# Patient Record
Sex: Female | Born: 1965 | Race: White | Hispanic: No | Marital: Single | Smoking: Current some day smoker
Health system: Southern US, Community
[De-identification: ages and names within clinical notes are randomized; demographics above are authoritative.]

## PROBLEM LIST (undated history)

## (undated) DIAGNOSIS — G8929 Other chronic pain: Secondary | ICD-10-CM

## (undated) DIAGNOSIS — J45909 Unspecified asthma, uncomplicated: Secondary | ICD-10-CM

## (undated) DIAGNOSIS — F329 Major depressive disorder, single episode, unspecified: Secondary | ICD-10-CM

## (undated) DIAGNOSIS — R519 Headache, unspecified: Secondary | ICD-10-CM

## (undated) DIAGNOSIS — C3490 Malignant neoplasm of unspecified part of unspecified bronchus or lung: Secondary | ICD-10-CM

## (undated) DIAGNOSIS — R011 Cardiac murmur, unspecified: Secondary | ICD-10-CM

## (undated) DIAGNOSIS — J449 Chronic obstructive pulmonary disease, unspecified: Secondary | ICD-10-CM

## (undated) DIAGNOSIS — F32A Depression, unspecified: Secondary | ICD-10-CM

## (undated) DIAGNOSIS — R0600 Dyspnea, unspecified: Secondary | ICD-10-CM

## (undated) HISTORY — DX: Malignant neoplasm of unspecified part of unspecified bronchus or lung: C34.90

## (undated) HISTORY — PX: TUBAL LIGATION: SHX77

## (undated) HISTORY — DX: Other chronic pain: G89.29

## (undated) HISTORY — DX: Headache, unspecified: R51.9

## (undated) HISTORY — DX: Chronic obstructive pulmonary disease, unspecified: J44.9

## (undated) NOTE — *Deleted (*Deleted)
Has armband been applied?  {yes no:314532}  Does patient have an allergy to IV contrast dye?: No   Has patient ever received premedication for IV contrast dye?: n/a  Does patient take metformin?: No  If patient does take metformin when was the last dose: n/a  Date of lab work: 11/16/2019 BUN: 25 CR: 0.91  eGfr: >60  IV site: Chest port  Has IV site been added to flowsheet?

## (undated) NOTE — *Deleted (*Deleted)
[facility]   [phone] [phone]   Clinic Follow up Note   Patient Care Team: Malachy Mood, MD as PCP - General (Hematology)  Date of Service:  10/21/2019  CHIEF COMPLAINT: F/u of Small Cell Lung Cancer  SUMMARY OF ONCOLOGIC HISTORY: Oncology History Overview Note  Cancer Staging Small cell lung cancer, left upper lobe [facility]) Staging form: Lung, AJCC 8th Edition - Clinical stage from 08/24/2018: Stage IIIA (cT1a, cN2, cM0) - Signed by Malachy Mood, MD on 10/17/2018    Small cell lung cancer, left upper lobe (HCC)  07/29/2018 Imaging   CT Angio Chest 07/29/18  IMPRESSION: 1. Malignant appearing left mediastinal and hilar lymphadenopathy narrowing the airways at the left hilum. In the left lung only a subtle 9 mm spiculated nodule is identified in the lingula. Consider small cell carcinoma. Bronchoscopy or mediastinal node sampling might be most valuable for diagnosis. 2. Indeterminate although low-density (which typically which indicates a benign adenoma) left adrenal nodule. 3.  Negative for acute pulmonary embolus.   08/24/2018 Definitive Surgery   Bronchoscopy and EBUS biopsy on 08/24/18 by Dr. Everardo All   08/24/2018 Initial Biopsy   Diagnosis 08/24/18 FINE NEEDLE ASPIRATION, ENDOSCOPIC, (EBUS) STATION # 7 LYMPH NODE(SPECIMEN 1 OF 4 COLLECTED 08/24/18): NO MALIGNANT CELLS IDENTIFIED.  FINE NEEDLE ASPIRATION, ENDOSCOPIC, (EBUS) LEFT HILAR LYMPH NODE(SPECIMEN 2 OF 4 COLLECTED 08/24/18): NO MALIGNANT CELLS IDENTIFIED.  TRANSBRONCHIAL NEEDLE ASPIRATION, WANG, (SPECIMEN 3 OF 4 COLLECTED 08/24/18): NO MALIGNANT CELLS IDENTIFIED.  BRONCHIAL BRUSHING, LUL (SPECIMEN 4 OF 4 COLLECTED 08/24/18): MALIGNANT CELLS PRESENT, MOST CONSISTENT WITH SMALL CELL CARCINOMA. SEE COMMENT.   08/24/2018 Cancer Staging   Staging form: Lung, AJCC 8th Edition - Clinical stage from 08/24/2018: Stage IIIA (cT1a, cN2, cM0) - Signed by Malachy Mood, MD on 10/17/2018   08/26/2018  Initial Diagnosis   Small cell lung cancer, left (HCC)   08/26/2018 Imaging   CT AP W Contrast 08/26/18  IMPRESSION: 1. 2.9 cm left adrenal nodule cannot be definitively characterized on this study. Potentially a lipid poor adenoma, but metastatic disease not excluded. MRI of the abdomen with and without contrast may prove helpful to further evaluate. 2. No other findings to suggest metastatic disease in the abdomen/pelvis. 3. Left base atelectasis with small left pleural effusion.   08/27/2018 Imaging   MRI brain 08/27/18  IMPRESSION: 1. Motion degraded, incomplete examination. 2. Subcentimeter focus of diffusion signal abnormality in the left cerebellum. No enhancement is evident to strongly suggest a metastasis, and this may reflect an acute to subacute small vessel infarct. 3. No evidence of intracranial metastatic disease elsewhere. 4. Given motion artifact and incomplete postcontrast imaging, consider short-term follow-up MRI (possibly with sedation) to further evaluate the left cerebellar abnormality and provide a more thorough evaluation for metastatic disease.   09/02/2018 PET scan   IMPRESSION: 1. Hypermetabolic left hilar mass is identified encasing the left mainstem bronchus resulting in postobstructive atelectasis in the left upper lobe. 2. Hypermetabolic left mediastinal nodal metastasis. 3. Loculated left pleural effusion is identified with mild increased FDG uptake. Cannot rule out malignant pleural effusion. 4. No evidence for metastatic disease to the abdomen or pelvis or skeletal structures.     09/07/2018 - 11/10/2018 Chemotherapy   Concurrent chemoRT with IV cisplatin on day 1 and etopside day 1-3 for every 3 weeks starting 8/11. Will start radiation with Dr. Mitzi Hansen with cycle 2. She completed 4 cycles on 11/10/18   09/14/2018 Procedure   Thoracentesis yielding 750 mL   09/28/2018 -  12/09/2019 Radiation Therapy   Concurrent chemoRT with Dr. Lisbeth Renshaw. Start with  cycle 2 chemo on 09/28/18 and completed on 12/09/18.    02/10/2019 Imaging   CT CAP W Contrast  IMPRESSION: 1. Interval response to therapy. Significant interval reduction in tumor burden involving the left hemithorax. 2. No new or progressive disease identified within the chest, abdomen or pelvis. 3. Emphysema and aortic atherosclerosis. 4. Stable left adrenal nodule.   Aortic Atherosclerosis (ICD10-I70.0) and Emphysema (ICD10-J43.9).   02/11/2019 Imaging   MRI Brain  IMPRESSION: 6 x 10 mm solitary metastatic deposit right frontal lobe with minimal edema.   02/28/2019 - 03/14/2019 Radiation Therapy   Target Brain Radiation with Dr. Lisbeth Renshaw    05/19/2019 Imaging   CT CAP w contrast  IMPRESSION: 1. Trace residual soft tissue thickening at the left lung apex and along the AP window. Slight increase in size of a low left paratracheal lymph node. Continued attention on follow-up exams is warranted. No evidence of distant metastatic disease. 2. Subpleural density in the posterior left upper lobe is new and may be due to atelectasis. Recommend attention on follow-up. 3. Left adrenal adenoma. 4. Aortic atherosclerosis (ICD10-I70.0). Coronary artery calcification. 5.  Emphysema (ICD10-J43.9).   07/13/2019 Imaging   MRI brain  IMPRESSION: 1. Increased size of right frontal lobe metastasis with new moderate edema. 2. No evidence of new intracranial metastases.     08/10/2019 Imaging   CT CAP W Contrast  IMPRESSION: 1. New and progressive adenopathy within the mediastinum and low left neck, consistent with nodal metastasis. 2. No evidence of pulmonary metastasis. Similar areas of probable scarring and pleural thickening. 3. No subdiaphragmatic metastatic disease identified. 4. Left adrenal adenoma. 5. Age advanced aortic atherosclerosis (ICD10-I70.0) and emphysema (ICD10-J43.9).     08/19/2019 Imaging   Ct Angio Chest  IMPRESSION: 1. No evidence of pulmonary embolus. 2. Stable  nodal metastasis within the mediastinum and left internal jugular chain. 3. Interval development of mild interlobular septal thickening and ground-glass opacity, greatest in the right upper lobe and left lower lobe. Differential includes asymmetric edema versus atypical infection. 4. Aortic Atherosclerosis (ICD10-I70.0) and Emphysema (ICD10-J43.9).     08/25/2019 Imaging   MRI Brain  IMPRESSION: Increased size of centrally necrotic enhancing right frontal lesion measuring 4.0 cm, previously 2.6 cm.   White matter edema and dural thickening/enhancement involving the right frontal convexity is more conspicuous than prior exam.   No new enhancing lesion.   09/01/2019 Procedure   PAC placement    09/19/2019 Imaging   MRI brain  IMPRESSION: 1. Postoperative changes from interval right frontal craniotomy for resection of previously identified right frontal lobe mass. Somewhat ill-defined parenchymal enhancement about the resection cavity favored to reflect postoperative changes. No definite residual tumor seen on this immediate postoperative scan. 2. Small 1.2 cm infarct at the deep margin of the resection cavity. 3. Interval increase in T2/FLAIR signal intensity involving the adjacent right frontal region with increased regional mass effect and up to 6 mm of right-to-left shift.   09/19/2019 Surgery   Right CRANIOTOMY TUMOR EXCISION, FRONTAL and APPLICATION OF CRANIAL NAVIGATION by Dr Kathyrn Sheriff   09/19/2019 Pathology Results   FINAL MICROSCOPIC DIAGNOSIS:   A. BRAIN TUMOR, RIGHT FRONTAL, EXCISION:  - Small cell carcinoma.   B. BRAIN TUMOR, RIGHT FRONTAL DURA, EXCISION:  - Small cell carcinoma.    COMMENT:   CK8/18 (dot-like), Synaptophysin and Chromogranin are positive.  CKAE1AE3, CK7 and Chromogranin are noncontributory.  Ki-67 proliferation  index reaches 70-80%.  The morphology and immunophenotype are compatible  with the provided clinical history of small cell  carcinoma.  Dr. Charm Barges  reviewed.    10/24/2019 -  Chemotherapy   First-line chemo Carboplatin day 1, Etopside day 1-3 with immunotherapy Tecentriq day 1 q3weeks starting 10/24/19      CURRENT THERAPY:  First-line chemo Carboplatin, Etopside day 1-3 with immunotherapy Tecentriq q3weeks starting 10/24/19  INTERVAL HISTORY: *** Marissa [name] is here for a follow up and treatment. She presents to the clinic alone.    REVIEW OF SYSTEMS:  *** Constitutional: Denies fevers, chills or abnormal weight loss Eyes: Denies blurriness of vision Ears, nose, mouth, throat, and face: Denies mucositis or sore throat Respiratory: Denies cough, dyspnea or wheezes Cardiovascular: Denies palpitation, chest discomfort or lower extremity swelling Gastrointestinal:  Denies nausea, heartburn or change in bowel habits Skin: Denies abnormal skin rashes Lymphatics: Denies new lymphadenopathy or easy bruising Neurological:Denies numbness, tingling or new weaknesses Behavioral/Psych: Mood is stable, no new changes  All other systems were reviewed with the patient and are negative.  MEDICAL HISTORY:  Past Medical History:  Diagnosis Date  . Asthma   . Chronic headaches   . COPD (chronic obstructive pulmonary disease) (HCC)   . Depression   . Dyspnea    hospitalized July 2020  . Heart murmur   . Small cell lung cancer (HCC)     SURGICAL HISTORY: Past Surgical History:  Procedure Laterality Date  . APPLICATION OF CRANIAL NAVIGATION Right 09/19/2019   Procedure: APPLICATION OF CRANIAL NAVIGATION;  Surgeon: Lisbeth Renshaw, MD;  Location: [facility] OR;  Service: Neurosurgery;  Laterality: Right;  APPLICATION OF CRANIAL NAVIGATION  . BRONCHIAL BIOPSY  08/24/2018   Procedure: BRONCHIAL BIOPSIES;  Surgeon: Luciano Cutter, MD;  Location: [facility];  Service: Cardiopulmonary;;  . BRONCHIAL BRUSHINGS  08/24/2018   Procedure: BRONCHIAL BRUSHINGS;  Surgeon: Luciano Cutter, MD;  Location: [facility];  Service:  Cardiopulmonary;;  . BRONCHIAL NEEDLE ASPIRATION BIOPSY  08/24/2018   Procedure: BRONCHIAL NEEDLE ASPIRATION BIOPSIES;  Surgeon: Luciano Cutter, MD;  Location: [facility];  Service: Cardiopulmonary;;  . CRANIOTOMY Right 09/19/2019   Procedure: CRANIOTOMY TUMOR EXCISION, FRONTAL;  Surgeon: Lisbeth Renshaw, MD;  Location[facility] OR;  Service: Neurosurgery;  Laterality: Right;  CRANIOTOMY TUMOR EXCISION, FRONTAL  . ENDOBRONCHIAL ULTRASOUND N/A 08/24/2018   Procedure: ENDOBRONCHIAL ULTRASOUND;  Surgeon: Luciano Cutter, MD;  Location: [facility];  Service: Cardiopulmonary;  Laterality: N/A;  . IR IMAGING GUIDED PORT INSERTION  09/01/2019  . IR THORACENTESIS ASP PLEURAL SPACE W/IMG GUIDE  09/14/2018  . TUBAL LIGATION    . VIDEO BRONCHOSCOPY  08/24/2018   Procedure: VIDEO BRONCHOSCOPY;  Surgeon: Luciano Cutter, MD;  Location: [facility];  Service: Cardiopulmonary;;    I have reviewed the social history and family history with the patient and they are unchanged from previous note.  ALLERGIES:  is allergic to penicillins.  MEDICATIONS:  Current Outpatient Medications  Medication Sig Dispense Refill  . acetaminophen (TYLENOL) 325 MG tablet Take 1,300 mg by mouth every 6 (six) hours as needed for mild pain or moderate pain.    . baclofen (LIORESAL) 10 MG tablet Take 1 tablet (10 mg total) by mouth 3 (three) times daily as needed for muscle spasms. 30 each 0  . diazepam (VALIUM) 10 MG tablet Take 1 tablet po 30 minutes prior to MRI or radiation (Patient taking differently: Take 20 mg by mouth See admin instructions. Take 1-2 tablets by mouth 30 minutes prior to MRI  or radiation) 5 tablet 0  . dronabinol (MARINOL) 5 MG capsule Take 1 capsule (5 mg total) by mouth 2 (two) times daily before a meal. 60 capsule 0  . DULoxetine (CYMBALTA) 20 MG capsule Take 2 capsules (40 mg total) by mouth daily. Take 1 capsule by mouth daily for the first week, then increase to 2 caps daily (Patient taking  differently: Take 40 mg by mouth daily. ) 60 capsule 1  . gabapentin (NEURONTIN) 300 MG capsule Take 1 tab in morning and 2 tabs at bedtime 90 capsule 2  . lidocaine (LIDODERM) 5 % Apply one patch to the area behind the affected ear as needed for pain (Patient taking differently: Place 1 patch onto the skin daily. ) 30 patch 0  . LORazepam (ATIVAN) 1 MG tablet Take 1 tablet (1 mg total) by mouth every 12 (twelve) hours as needed for anxiety. 30 tablet 0  . Oxycodone HCl 20 MG TABS Take 1 tablet (20 mg total) by mouth 3 (three) times daily as needed. 90 tablet 0  . promethazine (PHENERGAN) 25 MG tablet Take 1 tablet (25 mg total) by mouth every 6 (six) hours as needed for nausea or vomiting. 30 tablet 1   No current facility-administered medications for this visit.    PHYSICAL EXAMINATION: ECOG PERFORMANCE STATUS: {CHL ONC ECOG PS:[id number]}  There were no vitals filed for this visit. There were no vitals filed for this visit. *** GENERAL:alert, no distress and comfortable SKIN: skin color, texture, turgor are normal, no rashes or significant lesions EYES: normal, Conjunctiva are pink and non-injected, sclera clear {OROPHARYNX:no exudate, no erythema and lips, buccal mucosa, and tongue normal}  NECK: supple, thyroid normal size, non-tender, without nodularity LYMPH:  no palpable lymphadenopathy in the cervical, axillary {or inguinal} LUNGS: clear to auscultation and percussion with normal breathing effort HEART: regular rate & rhythm and no murmurs and no lower extremity edema ABDOMEN:abdomen soft, non-tender and normal bowel sounds Musculoskeletal:no cyanosis of digits and no clubbing  NEURO: alert & oriented x 3 with fluent speech, no focal motor/sensory deficits  LABORATORY DATA:  I have reviewed the data as listed CBC Latest Ref Rng & Units 09/19/2019 09/01/2019 08/19/2019  WBC 4.0 - 10.5 K/uL 7.0 6.9 9.3  Hemoglobin 12.0 - 15.0 g/dL 16.1 09.6 04.5  Hematocrit 36 - 46 % 42.0 44.7  43.4  Platelets 150 - 400 K/uL 305 300 328     CMP Latest Ref Rng & Units 09/19/2019 08/19/2019 08/18/2019  Glucose 70 - 99 mg/dL 409(W) 119(J) 96  BUN 6 - 20 mg/dL 47(W) 29(F) 62(Z)  Creatinine 0.44 - 1.00 mg/dL 3.08 6.57 8.46(N)  Sodium 135 - 145 mmol/L 143 142 145  Potassium 3.5 - 5.1 mmol/L 3.4(L) 4.0 4.6  Chloride 98 - 111 mmol/L 108 109 106  CO2 22 - 32 mmol/L 24 22 28   Calcium 8.9 - 10.3 mg/dL 62.9 9.7 9.5  Total Protein 6.5 - 8.1 g/dL - - 7.4  Total Bilirubin 0.3 - 1.2 mg/dL - - <0.1(L)  Alkaline Phos 38 - 126 U/L - - 67  AST 15 - 41 U/L - - 19  ALT 0 - 44 U/L - - 28      RADIOGRAPHIC STUDIES: I have personally reviewed the radiological images as listed and agreed with the findings in the report. No results found.   ASSESSMENT & PLAN:  Marissa [name] is a 71 y.o. female with    1.Small cell lung cancer, limited stagein 07/2018,brain metastasis in 01/2019, mediastinal  node recurrence in 07/2019 -She was diagnosed in 07/2018. Given her locally advanced disease (stage III cancer), she underwent standard treatment with concurrent chemoRT with 4 cycles of cisplatin/etopside.  -Unfortunately her Brain MRI from 02/11/19 showed 6x10 mm solitary brain metastasis in right frontal lobe. She completed radiation to brain 02/28/19-03/14/19 with Dr. Mitzi Hansen. -Her cancer progressed on CT CAP from 08/10/19 which showed new and progressive adenopathy within the mediastinum and low left neck consistent with nodal metastasis. No evidence of pulmonary or other distant metastasis. -Given symptomatic brain metastasis progression. She underwent right craniotomy tumor resection on 09/19/19. She is recovering well  -I recommend CT chest to determine new baseline. Given recurrent disease, I discussed restarting chemo with IV Carboplatin on day 1, Etopside day 1-3 with immunotherapy Tecentriq on day 1 q3weeks to control her disease. She is interested, plan to start on 9/27.  -the goal of therapy is palliative,  for disease control and prolonging her life  -F/u with start of chemo.    2. Headaches, secondary to brain metastasis, S/p resection.  -She underwent right craniotomy tumor excision with Dr Conchita Paris on 09/19/19. She her only head pain is from this currently.  -She will continue to f/u with Dr. Barbaraann Cao.  3. Smoking Cessation, COPD,H/oacute left tension pneumothorax in 07/2018 after bronchoscopy and EBUS biopsy (resolved) -She has a h/o heavy smoking. She has COPD. She is no longer on oxygen but has wheezing. Will continue inhaler and nebulizer.  -She has reduced her smoking to 10-11 cigarettes a day. I highly advised her to quit completely. She is still trying. -She has been using her supplemental oxygen and breathing treatment more often lately due to SOB. Will obtain CT chest next week for new baseline.   4. Severe LUQ and left flank pain, right knee pain  -She is now being seen by Dr Genia Hotter for pain management.She remains on Oxycodone 20mg  q6hours and Cymbalta.  -Her main pain is currently from her head at surgical site and her right knee.  -I refilled Gabapentin 300mg  today (10/14/19)  5. Iron deficient anemia, S/p 2 IV Iron doses in 07/2018.Currently resolved off chemo   6. Anxiety/Depression, irritability, Social Support  -On Ativan and Valium as needed.  -She notes she uses Marijuana to help her eat. She is onlow dose oxycodonenow. She denies any other use of recreational drugs.  -She will continue SW involvement. She has not seen MH provider yet.  -Currently on Medicaid, disability, food stamps. She gets $529 per month. Still does not have a car and gets transportation assistance from [facility].  -She plans to move to better housing (3 bedroom house) in 2 weeks -Mood seems more positive lately  7. Goals of care -She knows her new brain metastasis makes her cancer stage IV and carries a poor prognosis.  -goal of her treatment is palliative  -she is full code now     PLAN: -I refilled Gabapentin and Ativan today  -Lab, Flush, CT CAP w contrast next week for restaging.  -Lab, flush, F/u with me or NP Lacie and chemo Carboplatin, Etopside Tecentriq on 9/27 with Etopside on Day 1-3.      No problem-specific Assessment & Plan notes found for this encounter.   No orders of the defined types were placed in this encounter.  All questions were answered. The patient knows to call the clinic with any problems, questions or concerns. No barriers to learning was detected. The total time spent in the appointment was {CHL ONC TIME VISIT -  JYNWG:[id number]}.     Delphina Cahill 10/21/2019   Rogelia Rohrer, am acting as scribe for Malachy Mood, MD.   {Add scribe attestation statement}

## (undated) NOTE — *Deleted (*Deleted)
[facility]   [phone] [phone]   Clinic Follow up Note   Patient Care Team: Malachy Mood, MD as PCP - General (Hematology)  Date of Service:  11/25/2019  CHIEF COMPLAINT: F/u of Small Cell Lung Cancer  SUMMARY OF ONCOLOGIC HISTORY: Oncology History Overview Note  Cancer Staging Small cell lung cancer, left upper lobe (HCC) Staging form: Lung, AJCC 8th Edition - Clinical stage from 08/24/2018: Stage IIIA (cT1a, cN2, cM0) - Signed by Malachy Mood, MD on 10/17/2018    Small cell lung cancer, left upper lobe (HCC)  07/29/2018 Imaging   CT Angio Chest 07/29/18  IMPRESSION: 1. Malignant appearing left mediastinal and hilar lymphadenopathy narrowing the airways at the left hilum. In the left lung only a subtle 9 mm spiculated nodule is identified in the lingula. Consider small cell carcinoma. Bronchoscopy or mediastinal node sampling might be most valuable for diagnosis. 2. Indeterminate although low-density (which typically which indicates a benign adenoma) left adrenal nodule. 3.  Negative for acute pulmonary embolus.   08/24/2018 Definitive Surgery   Bronchoscopy and EBUS biopsy on 08/24/18 by Dr. Everardo All   08/24/2018 Initial Biopsy   Diagnosis 08/24/18 FINE NEEDLE ASPIRATION, ENDOSCOPIC, (EBUS) STATION # 7 LYMPH NODE(SPECIMEN 1 OF 4 COLLECTED 08/24/18): NO MALIGNANT CELLS IDENTIFIED.  FINE NEEDLE ASPIRATION, ENDOSCOPIC, (EBUS) LEFT HILAR LYMPH NODE(SPECIMEN 2 OF 4 COLLECTED 08/24/18): NO MALIGNANT CELLS IDENTIFIED.  TRANSBRONCHIAL NEEDLE ASPIRATION, WANG, (SPECIMEN 3 OF 4 COLLECTED 08/24/18): NO MALIGNANT CELLS IDENTIFIED.  BRONCHIAL BRUSHING, LUL (SPECIMEN 4 OF 4 COLLECTED 08/24/18): MALIGNANT CELLS PRESENT, MOST CONSISTENT WITH SMALL CELL CARCINOMA. SEE COMMENT.   08/24/2018 Cancer Staging   Staging form: Lung, AJCC 8th Edition - Clinical stage from 08/24/2018: Stage IIIA (cT1a, cN2, cM0) - Signed by Malachy Mood, MD on 10/17/2018   08/26/2018  Initial Diagnosis   Small cell lung cancer, left (HCC)   08/26/2018 Imaging   CT AP W Contrast 08/26/18  IMPRESSION: 1. 2.9 cm left adrenal nodule cannot be definitively characterized on this study. Potentially a lipid poor adenoma, but metastatic disease not excluded. MRI of the abdomen with and without contrast may prove helpful to further evaluate. 2. No other findings to suggest metastatic disease in the abdomen/pelvis. 3. Left base atelectasis with small left pleural effusion.   08/27/2018 Imaging   MRI brain 08/27/18  IMPRESSION: 1. Motion degraded, incomplete examination. 2. Subcentimeter focus of diffusion signal abnormality in the left cerebellum. No enhancement is evident to strongly suggest a metastasis, and this may reflect an acute to subacute small vessel infarct. 3. No evidence of intracranial metastatic disease elsewhere. 4. Given motion artifact and incomplete postcontrast imaging, consider short-term follow-up MRI (possibly with sedation) to further evaluate the left cerebellar abnormality and provide a more thorough evaluation for metastatic disease.   09/02/2018 PET scan   IMPRESSION: 1. Hypermetabolic left hilar mass is identified encasing the left mainstem bronchus resulting in postobstructive atelectasis in the left upper lobe. 2. Hypermetabolic left mediastinal nodal metastasis. 3. Loculated left pleural effusion is identified with mild increased FDG uptake. Cannot rule out malignant pleural effusion. 4. No evidence for metastatic disease to the abdomen or pelvis or skeletal structures.     09/07/2018 - 11/10/2018 Chemotherapy   Concurrent chemoRT with IV cisplatin on day 1 and etopside day 1-3 for every 3 weeks starting 8/11. Will start radiation with Dr. Mitzi Hansen with cycle 2. She completed 4 cycles on 11/10/18   09/14/2018 Procedure   Thoracentesis yielding 750 mL   09/28/2018 -  12/09/2019 Radiation Therapy   Concurrent chemoRT with Dr. Lisbeth Renshaw. Start with  cycle 2 chemo on 09/28/18 and completed on 12/09/18.    02/10/2019 Imaging   CT CAP W Contrast  IMPRESSION: 1. Interval response to therapy. Significant interval reduction in tumor burden involving the left hemithorax. 2. No new or progressive disease identified within the chest, abdomen or pelvis. 3. Emphysema and aortic atherosclerosis. 4. Stable left adrenal nodule.   Aortic Atherosclerosis (ICD10-I70.0) and Emphysema (ICD10-J43.9).   02/11/2019 Imaging   MRI Brain  IMPRESSION: 6 x 10 mm solitary metastatic deposit right frontal lobe with minimal edema.   02/28/2019 - 03/14/2019 Radiation Therapy   Target Brain Radiation with Dr. Lisbeth Renshaw    05/19/2019 Imaging   CT CAP w contrast  IMPRESSION: 1. Trace residual soft tissue thickening at the left lung apex and along the AP window. Slight increase in size of a low left paratracheal lymph node. Continued attention on follow-up exams is warranted. No evidence of distant metastatic disease. 2. Subpleural density in the posterior left upper lobe is new and may be due to atelectasis. Recommend attention on follow-up. 3. Left adrenal adenoma. 4. Aortic atherosclerosis (ICD10-I70.0). Coronary artery calcification. 5.  Emphysema (ICD10-J43.9).   07/13/2019 Imaging   MRI brain  IMPRESSION: 1. Increased size of right frontal lobe metastasis with new moderate edema. 2. No evidence of new intracranial metastases.     08/10/2019 Imaging   CT CAP W Contrast  IMPRESSION: 1. New and progressive adenopathy within the mediastinum and low left neck, consistent with nodal metastasis. 2. No evidence of pulmonary metastasis. Similar areas of probable scarring and pleural thickening. 3. No subdiaphragmatic metastatic disease identified. 4. Left adrenal adenoma. 5. Age advanced aortic atherosclerosis (ICD10-I70.0) and emphysema (ICD10-J43.9).     08/19/2019 Imaging   Ct Angio Chest  IMPRESSION: 1. No evidence of pulmonary embolus. 2. Stable  nodal metastasis within the mediastinum and left internal jugular chain. 3. Interval development of mild interlobular septal thickening and ground-glass opacity, greatest in the right upper lobe and left lower lobe. Differential includes asymmetric edema versus atypical infection. 4. Aortic Atherosclerosis (ICD10-I70.0) and Emphysema (ICD10-J43.9).     08/25/2019 Imaging   MRI Brain  IMPRESSION: Increased size of centrally necrotic enhancing right frontal lesion measuring 4.0 cm, previously 2.6 cm.   White matter edema and dural thickening/enhancement involving the right frontal convexity is more conspicuous than prior exam.   No new enhancing lesion.   09/01/2019 Procedure   PAC placement    09/19/2019 Imaging   MRI brain  IMPRESSION: 1. Postoperative changes from interval right frontal craniotomy for resection of previously identified right frontal lobe mass. Somewhat ill-defined parenchymal enhancement about the resection cavity favored to reflect postoperative changes. No definite residual tumor seen on this immediate postoperative scan. 2. Small 1.2 cm infarct at the deep margin of the resection cavity. 3. Interval increase in T2/FLAIR signal intensity involving the adjacent right frontal region with increased regional mass effect and up to 6 mm of right-to-left shift.   09/19/2019 Surgery   Right CRANIOTOMY TUMOR EXCISION, FRONTAL and APPLICATION OF CRANIAL NAVIGATION by Dr Kathyrn Sheriff   09/19/2019 Pathology Results   FINAL MICROSCOPIC DIAGNOSIS:   A. BRAIN TUMOR, RIGHT FRONTAL, EXCISION:  - Small cell carcinoma.   B. BRAIN TUMOR, RIGHT FRONTAL DURA, EXCISION:  - Small cell carcinoma.    COMMENT:   CK8/18 (dot-like), Synaptophysin and Chromogranin are positive.  CKAE1AE3, CK7 and Chromogranin are noncontributory.  Ki-67 proliferation  index reaches 70-80%.  The morphology and immunophenotype are compatible  with the provided clinical history of small cell  carcinoma.  Dr. Charm Barges  reviewed.    10/27/2019 Imaging   IMPRESSION: 1. Significant interval enlargement of bulky mediastinal and left hilar lymphadenopathy. There are new pleural nodules or prevascular lymph nodes. 2. Significant interval increase in masslike consolidation about the left hilum. There is interlobular septal thickening and scattered ground-glass and heterogeneous airspace opacity throughout the left lung, predominantly involving the left upper lobe and concerning for lymphangitic spread and postobstructive airspace disease. 3. There is somewhat nodular pleural thickening about the medial left upper lobe and a new small left pleural effusion. 4. Findings are consistent with worsened lung malignancy with pleural and nodal metastatic disease. 5. No significant interval change in a left adrenal mass, likely an incidental adenoma. Attention on follow-up. 6. No definite evidence of distant metastatic disease within the abdomen or pelvis. 7. Emphysema (ICD10-J43.9). 8. Aortic Atherosclerosis (ICD10-I70.0)   11/07/2019 -  Chemotherapy   First-line chemo Carboplatin day 1, Etopside day 1-3 with immunotherapy Tecentriq day 1 q3weeks starting 11/07/19      CURRENT THERAPY:  First-line chemo Carboplatin, Etopside day 1-3 with immunotherapy Tecentriq q3weeks starting 11/07/19  INTERVAL HISTORY: *** Kamiah Fite is here for a follow up. She presents to the clinic alone.     REVIEW OF SYSTEMS:  *** Constitutional: Denies fevers, chills or abnormal weight loss Eyes: Denies blurriness of vision Ears, nose, mouth, throat, and face: Denies mucositis or sore throat Respiratory: Denies cough, dyspnea or wheezes Cardiovascular: Denies palpitation, chest discomfort or lower extremity swelling Gastrointestinal:  Denies nausea, heartburn or change in bowel habits Skin: Denies abnormal skin rashes Lymphatics: Denies new lymphadenopathy or easy bruising Neurological:Denies  numbness, tingling or new weaknesses Behavioral/Psych: Mood is stable, no new changes  All other systems were reviewed with the patient and are negative.  MEDICAL HISTORY:  Past Medical History:  Diagnosis Date  . Asthma   . Chronic headaches   . COPD (chronic obstructive pulmonary disease) (HCC)   . Depression   . Dyspnea    hospitalized July 2020  . Heart murmur   . Small cell lung cancer (HCC)     SURGICAL HISTORY: Past Surgical History:  Procedure Laterality Date  . APPLICATION OF CRANIAL NAVIGATION Right 09/19/2019   Procedure: APPLICATION OF CRANIAL NAVIGATION;  Surgeon: Lisbeth Renshaw, MD;  Location: [facility] OR;  Service: Neurosurgery;  Laterality: Right;  APPLICATION OF CRANIAL NAVIGATION  . BRONCHIAL BIOPSY  08/24/2018   Procedure: BRONCHIAL BIOPSIES;  Surgeon: Luciano Cutter, MD;  Location: [facility];  Service: Cardiopulmonary;;  . BRONCHIAL BRUSHINGS  08/24/2018   Procedure: BRONCHIAL BRUSHINGS;  Surgeon: Luciano Cutter, MD;  Location: [facility];  Service: Cardiopulmonary;;  . BRONCHIAL NEEDLE ASPIRATION BIOPSY  08/24/2018   Procedure: BRONCHIAL NEEDLE ASPIRATION BIOPSIES;  Surgeon: Luciano Cutter, MD;  Location: [facility];  Service: Cardiopulmonary;;  . CRANIOTOMY Right 09/19/2019   Procedure: CRANIOTOMY TUMOR EXCISION, FRONTAL;  Surgeon: Lisbeth Renshaw, MD;  Location: [facility] OR;  Service: Neurosurgery;  Laterality: Right;  CRANIOTOMY TUMOR EXCISION, FRONTAL  . ENDOBRONCHIAL ULTRASOUND N/A 08/24/2018   Procedure: ENDOBRONCHIAL ULTRASOUND;  Surgeon: Luciano Cutter, MD;  Location: [facility];  Service: Cardiopulmonary;  Laterality: N/A;  . IR IMAGING GUIDED PORT INSERTION  09/01/2019  . IR THORACENTESIS ASP PLEURAL SPACE W/IMG GUIDE  09/14/2018  . TUBAL LIGATION    . VIDEO BRONCHOSCOPY  08/24/2018   Procedure: VIDEO BRONCHOSCOPY;  Surgeon: Luciano Cutter, MD;  Location: [facility];  Service: Cardiopulmonary;;    I have reviewed the social history and  family history with the patient and they are unchanged from previous note.  ALLERGIES:  is allergic to penicillins.  MEDICATIONS:  Current Outpatient Medications  Medication Sig Dispense Refill  . acetaminophen (TYLENOL) 325 MG tablet Take 1,300 mg by mouth every 6 (six) hours as needed for mild pain or moderate pain.    . baclofen (LIORESAL) 10 MG tablet Take 1 tablet (10 mg total) by mouth 3 (three) times daily as needed for muscle spasms. 30 each 0  . dexamethasone (DECADRON) 4 MG tablet Take 2 tablets (8 mg total) by mouth 2 (two) times daily. 30 tablet 0  . diazepam (VALIUM) 10 MG tablet Take 2 tablets (20 mg total) by mouth See admin instructions. Take 1-2 tablets by mouth 30 minutes prior to MRI or radiation 3 tablet 0  . dronabinol (MARINOL) 5 MG capsule Take 1 capsule (5 mg total) by mouth 2 (two) times daily before a meal. 60 capsule 0  . DULoxetine (CYMBALTA) 20 MG capsule Take 2 capsules (40 mg total) by mouth daily. Take 1 capsule by mouth daily for the first week, then increase to 2 caps daily (Patient not taking: Reported on 11/23/2019) 60 capsule 1  . gabapentin (NEURONTIN) 300 MG capsule Take 1 tab in morning and 2 tabs at bedtime 90 capsule 2  . lidocaine (LIDODERM) 5 % Place 1 patch onto the skin daily. 20 patch 1  . lidocaine-prilocaine (EMLA) cream Apply to affected area once 30 g 3  . LORazepam (ATIVAN) 1 MG tablet Take 1 tablet (1 mg total) by mouth every 12 (twelve) hours as needed for anxiety. 30 tablet 0  . ondansetron (ZOFRAN) 8 MG tablet Take 1 tablet (8 mg total) by mouth 2 (two) times daily as needed for refractory nausea / vomiting. Start on day 3 after carboplatin chemo. 30 tablet 1  . Oxycodone HCl 20 MG TABS Take 1 tablet (20 mg total) by mouth 3 (three) times daily as needed. 90 tablet 0  . prochlorperazine (COMPAZINE) 10 MG tablet Take 1 tablet (10 mg total) by mouth every 6 (six) hours as needed (Nausea or vomiting). 30 tablet 1   No current  facility-administered medications for this visit.    PHYSICAL EXAMINATION: ECOG PERFORMANCE STATUS: {CHL ONC ECOG PS:[id number]}  There were no vitals filed for this visit. There were no vitals filed for this visit. *** GENERAL:alert, no distress and comfortable SKIN: skin color, texture, turgor are normal, no rashes or significant lesions EYES: normal, Conjunctiva are pink and non-injected, sclera clear {OROPHARYNX:no exudate, no erythema and lips, buccal mucosa, and tongue normal}  NECK: supple, thyroid normal size, non-tender, without nodularity LYMPH:  no palpable lymphadenopathy in the cervical, axillary {or inguinal} LUNGS: clear to auscultation and percussion with normal breathing effort HEART: regular rate & rhythm and no murmurs and no lower extremity edema ABDOMEN:abdomen soft, non-tender and normal bowel sounds Musculoskeletal:no cyanosis of digits and no clubbing  NEURO: alert & oriented x 3 with fluent speech, no focal motor/sensory deficits  LABORATORY DATA:  I have reviewed the data as listed CBC Latest Ref Rng & Units 11/16/2019 11/07/2019 09/19/2019  WBC 4.0 - 10.5 K/uL 4.0 9.2 7.0  Hemoglobin 12.0 - 15.0 g/dL 10.9(L) 11.7(L) 13.2  Hematocrit 36 - 46 % 34.9(L) 38.6 42.0  Platelets 150 - 400 K/uL 240 446(H) 305     CMP Latest Ref Rng & Units 11/16/2019 11/07/2019 09/19/2019  Glucose 70 - 99 mg/dL 130(Q) 91 657(Q)  BUN 6 - 20 mg/dL 46(N) 16 62(X)  Creatinine 0.44 - 1.00 mg/dL 5.28 4.13 2.44  Sodium 135 - 145 mmol/L 142 141 143  Potassium 3.5 - 5.1 mmol/L 3.5 4.0 3.4(L)  Chloride 98 - 111 mmol/L 106 108 108  CO2 22 - 32 mmol/L 27 28 24   Calcium 8.9 - 10.3 mg/dL 01.0 9.5 27.2  Total Protein 6.5 - 8.1 g/dL 7.4 7.2 -  Total Bilirubin 0.3 - 1.2 mg/dL 0.3 <5.3(G) -  Alkaline Phos 38 - 126 U/L 90 87 -  AST 15 - 41 U/L 23 16 -  ALT 0 - 44 U/L 40 13 -      RADIOGRAPHIC STUDIES: I have personally reviewed the radiological images as listed and agreed with the  findings in the report. No results found.   ASSESSMENT & PLAN:  Marissa [name] is a 52 y.o. female with   1.Small cell lung cancer, limited stagein 07/2018,brain metastasis in 01/2019, mediastinal node recurrence in 07/2019 -She was diagnosed in 07/2018. Given her locally advanced disease (stage III cancer), she underwent standard treatment with concurrent chemoRT with 4 cycles of cisplatin/etopside.  -Unfortunately her Brain MRI from 02/11/19 showed 6x10 mm solitary brain metastasis in right frontal lobe. She completed radiation to brain 02/28/19-03/14/19 with Dr. Mitzi Hansen. -Her cancerprogressed on CT CAP from 08/10/19 which showednew and progressive adenopathy within the mediastinum and low left neck consistent with nodal metastasis. No evidence of pulmonary or other distant metastasis. -Given symptomatic brain metastasis progression. She underwent right craniotomy tumor resection on 09/19/19. -Given recurrent disease, I started her on first-line IV Carboplatin on day 1, Etopside day 1-3 with immunotherapy Tecentriq on day 1 q3weeks beginning 11/07/19. Goal is to control her disease and prolong her life.  -She tolerated her first cycle chemo with nausea and fatigue. She was able to recover. Labs reviewed, CBC and CMP WNL except Hg 10.9. Will proceed with IV Fluids today.  -F/u with C2 on 11/1  2. Head pain, Decreased right eye vision, Decreased jaw ROM -She underwent right craniotomy tumor excision with Dr Conchita Paris on 09/19/19.She will continue to f/u with Dr. Barbaraann Cao. -Since her surgery she notes increasing head pain triggered by her right scalp incision. Her incision has healed very well. Her pain can get up to 15/10 which is worse than before her surgery.  -She notes right eye vision has decreased and takes time for her vision to focus. Also notes decreased right jaw ROM which effects her eating.  -She has not followed up with Dr Barbaraann Cao or her neurosurgeon recently. I recommend MRI brain, she is  agreeable.   3. Smoking Cessation, COPD,H/oacute left tension pneumothorax in 07/2018 after bronchoscopy and EBUS biopsy (resolved) -She has a h/o heavy smoking. She has COPD. Will continue inhaler and nebulizer. -She has reduced her smoking to10-11cigarettes a day. I highly advised her to quit completely. -She does have disease progression in lung on 10/27/19 CT scan. Will proceed with chemo to better control disease.   4. Severe LUQ and left flank pain, right knee pain -She is now being seen by Dr Genia Hotter for pain management.She remains on Oxycodone20mg q6hours and Cymbalta and Gabapentin.and Lidocaine patch for her right knee pain.  -Her overall pain is controlled except her recently worsened headaches.   5. Iron deficient anemia, S/p 2 IV Iron doses in 07/2018. -Currently mild on first-line chemo. Will monitor.   6. Anxiety/Depression, irritability, Social Support -On Ativan and Valium as  needed. -She notes she uses Marijuana to help her eat.She is onlow dose oxycodonenow. She denies any other use of recreational drugs.  -She will continue SW involvement. She has not seen MH provider yet.  -Currently on Medicaid, disability, food stamps. She gets $529 per month. Still does not have a carand gets transportation assistance from [facility].  -She plans to move to better housing (3 bedroom house) soon.  -Mood seems more stable on her medication.   7. Goals of care -She knows her new brain metastasis makes her cancer stage IV and carries a poor prognosis.  -goal of her treatment is palliative  -she is full code now    PLAN: -I refilled valium, Marinol, lidocaine and ativan today  -MRI brain w wo contrast ASAP, will call her after MRI  -IV Fluids today  -Lab, flush, F/u with me or NP Lacie and chemo Carboplatin, Etopside Tecentriq on 11/1 with Etopside on Day 1-3.   No problem-specific Assessment & Plan notes found for this encounter.   No orders of the defined  types were placed in this encounter.  All questions were answered. The patient knows to call the clinic with any problems, questions or concerns. No barriers to learning was detected. The total time spent in the appointment was {CHL ONC TIME VISIT - [id number]}.     Delphina Cahill 11/25/2019   Rogelia Rohrer, am acting as scribe for Malachy Mood, MD.   {Add scribe attestation statement}

---
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
## 1997-05-15 ENCOUNTER — Emergency Department (HOSPITAL_COMMUNITY): Admission: EM | Admit: 1997-05-15 | Discharge: 1997-05-15 | Payer: Self-pay | Admitting: Emergency Medicine

## 1997-06-24 ENCOUNTER — Emergency Department (HOSPITAL_COMMUNITY): Admission: EM | Admit: 1997-06-24 | Discharge: 1997-06-24 | Payer: Self-pay | Admitting: Emergency Medicine

## 1997-08-20 ENCOUNTER — Emergency Department (HOSPITAL_COMMUNITY): Admission: EM | Admit: 1997-08-20 | Discharge: 1997-08-20 | Payer: Self-pay | Admitting: Emergency Medicine

## 1997-11-11 ENCOUNTER — Emergency Department (HOSPITAL_COMMUNITY): Admission: EM | Admit: 1997-11-11 | Discharge: 1997-11-11 | Payer: Self-pay | Admitting: Emergency Medicine

## 1997-11-12 ENCOUNTER — Encounter: Payer: Self-pay | Admitting: Emergency Medicine

## 1999-05-08 ENCOUNTER — Emergency Department (HOSPITAL_COMMUNITY): Admission: EM | Admit: 1999-05-08 | Discharge: 1999-05-08 | Payer: Self-pay | Admitting: Emergency Medicine

## 1999-06-24 ENCOUNTER — Encounter: Payer: Self-pay | Admitting: Emergency Medicine

## 1999-06-24 ENCOUNTER — Emergency Department (HOSPITAL_COMMUNITY): Admission: EM | Admit: 1999-06-24 | Discharge: 1999-06-24 | Payer: Self-pay | Admitting: Emergency Medicine

## 1999-09-14 ENCOUNTER — Emergency Department (HOSPITAL_COMMUNITY): Admission: EM | Admit: 1999-09-14 | Discharge: 1999-09-14 | Payer: Self-pay | Admitting: Emergency Medicine

## 2000-01-10 ENCOUNTER — Encounter: Payer: Self-pay | Admitting: Emergency Medicine

## 2000-01-10 ENCOUNTER — Emergency Department (HOSPITAL_COMMUNITY): Admission: EM | Admit: 2000-01-10 | Discharge: 2000-01-10 | Payer: Self-pay | Admitting: Emergency Medicine

## 2000-04-20 ENCOUNTER — Encounter: Payer: Self-pay | Admitting: Emergency Medicine

## 2000-04-20 ENCOUNTER — Emergency Department (HOSPITAL_COMMUNITY): Admission: EM | Admit: 2000-04-20 | Discharge: 2000-04-20 | Payer: Self-pay | Admitting: Emergency Medicine

## 2000-07-14 ENCOUNTER — Emergency Department (HOSPITAL_COMMUNITY): Admission: EM | Admit: 2000-07-14 | Discharge: 2000-07-14 | Payer: Self-pay | Admitting: Emergency Medicine

## 2000-07-14 ENCOUNTER — Encounter: Payer: Self-pay | Admitting: Emergency Medicine

## 2001-11-22 ENCOUNTER — Emergency Department (HOSPITAL_COMMUNITY): Admission: EM | Admit: 2001-11-22 | Discharge: 2001-11-22 | Payer: Self-pay | Admitting: Emergency Medicine

## 2001-11-25 ENCOUNTER — Encounter: Admission: RE | Admit: 2001-11-25 | Discharge: 2001-11-25 | Payer: Self-pay | Admitting: Obstetrics and Gynecology

## 2001-11-28 ENCOUNTER — Emergency Department (HOSPITAL_COMMUNITY): Admission: EM | Admit: 2001-11-28 | Discharge: 2001-11-28 | Payer: Self-pay | Admitting: Emergency Medicine

## 2002-04-24 ENCOUNTER — Emergency Department (HOSPITAL_COMMUNITY): Admission: EM | Admit: 2002-04-24 | Discharge: 2002-04-24 | Payer: Self-pay

## 2002-04-24 ENCOUNTER — Encounter: Payer: Self-pay | Admitting: Emergency Medicine

## 2002-08-30 ENCOUNTER — Emergency Department (HOSPITAL_COMMUNITY): Admission: EM | Admit: 2002-08-30 | Discharge: 2002-08-30 | Payer: Self-pay | Admitting: Emergency Medicine

## 2002-10-09 ENCOUNTER — Emergency Department (HOSPITAL_COMMUNITY): Admission: EM | Admit: 2002-10-09 | Discharge: 2002-10-10 | Payer: Self-pay | Admitting: Emergency Medicine

## 2003-04-06 ENCOUNTER — Emergency Department (HOSPITAL_COMMUNITY): Admission: EM | Admit: 2003-04-06 | Discharge: 2003-04-06 | Payer: Self-pay | Admitting: Family Medicine

## 2006-09-29 ENCOUNTER — Emergency Department (HOSPITAL_COMMUNITY): Admission: EM | Admit: 2006-09-29 | Discharge: 2006-09-29 | Payer: Self-pay | Admitting: Emergency Medicine

## 2009-03-16 ENCOUNTER — Emergency Department (HOSPITAL_COMMUNITY): Admission: EM | Admit: 2009-03-16 | Discharge: 2009-03-16 | Payer: Self-pay | Admitting: Emergency Medicine

## 2010-04-17 LAB — BASIC METABOLIC PANEL
BUN: 10 mg/dL (ref 6–23)
CO2: 28 mEq/L (ref 19–32)
Calcium: 9.3 mg/dL (ref 8.4–10.5)
Chloride: 104 mEq/L (ref 96–112)
Creatinine, Ser: 0.58 mg/dL (ref 0.4–1.2)
GFR calc Af Amer: >60 mL/min (ref >60)
GFR calc non Af Amer: >60 mL/min (ref >60)
Glucose, Bld: 97 mg/dL (ref 70–99)
Potassium: 3.6 mEq/L (ref 3.5–5.1)
Sodium: 137 mEq/L (ref 135–145)

## 2010-04-17 LAB — RAPID URINE DRUG SCREEN, HOSP PERFORMED
Amphetamines: POSITIVE — AB
Barbiturates: NOT DETECTED
Benzodiazepines: NOT DETECTED
Cocaine: POSITIVE — AB
Opiates: NOT DETECTED
Tetrahydrocannabinol: POSITIVE — AB

## 2010-04-17 LAB — DIFFERENTIAL
Basophils Absolute: 0.1 10*3/uL (ref 0.0–0.1)
Basophils Relative: 1 % (ref 0–1)
Eosinophils Absolute: 0.1 10*3/uL (ref 0.0–0.7)
Eosinophils Relative: 1 % (ref 0–5)
Lymphocytes Relative: 23 % (ref 12–46)
Lymphs Abs: 2 10*3/uL (ref 0.7–4.0)
Monocytes Absolute: 0.8 10*3/uL (ref 0.1–1.0)
Monocytes Relative: 9 % (ref 3–12)
Neutro Abs: 5.7 10*3/uL (ref 1.7–7.7)
Neutrophils Relative %: 66 % (ref 43–77)

## 2010-04-17 LAB — URINALYSIS, ROUTINE W REFLEX MICROSCOPIC
Bilirubin Urine: NEGATIVE
Glucose, UA: NEGATIVE mg/dL
Hgb urine dipstick: NEGATIVE
Ketones, ur: NEGATIVE mg/dL
Leukocytes, UA: NEGATIVE
Nitrite: NEGATIVE
Protein, ur: 30 mg/dL — AB
Specific Gravity, Urine: 1.025 (ref 1.005–1.030)
Urobilinogen, UA: 1 mg/dL (ref 0.0–1.0)
pH: 8.5 — ABNORMAL HIGH (ref 5.0–8.0)

## 2010-04-17 LAB — URINE MICROSCOPIC-ADD ON

## 2010-04-17 LAB — CBC
HCT: 41.7 % (ref 36.0–46.0)
Hemoglobin: 14.2 g/dL (ref 12.0–15.0)
MCHC: 34 g/dL (ref 30.0–36.0)
MCV: 89.3 fL (ref 78.0–100.0)
Platelets: 323 10*3/uL (ref 150–400)
RBC: 4.67 MIL/uL (ref 3.87–5.11)
RDW: 13.6 % (ref 11.5–15.5)
WBC: 8.6 10*3/uL (ref 4.0–10.5)

## 2010-04-17 LAB — POCT PREGNANCY, URINE: Preg Test, Ur: NEGATIVE

## 2010-04-17 LAB — ETHANOL: Alcohol, Ethyl (B): <5 mg/dL (ref 0–10)

## 2010-08-07 ENCOUNTER — Inpatient Hospital Stay (INDEPENDENT_AMBULATORY_CARE_PROVIDER_SITE_OTHER)
Admission: RE | Admit: 2010-08-07 | Discharge: 2010-08-07 | Disposition: A | Discharge status: Home / Self Care | Payer: Self-pay | Source: Ambulatory Visit | Attending: Family Medicine | Admitting: Family Medicine

## 2010-08-07 DIAGNOSIS — S025XXA Fracture of tooth (traumatic), initial encounter for closed fracture: Secondary | ICD-10-CM

## 2010-08-07 DIAGNOSIS — K089 Disorder of teeth and supporting structures, unspecified: Secondary | ICD-10-CM

## 2010-10-20 ENCOUNTER — Emergency Department (HOSPITAL_COMMUNITY)
Admission: EM | Admit: 2010-10-20 | Discharge: 2010-10-20 | Disposition: A | Discharge status: Home / Self Care | Payer: Self-pay | Attending: Emergency Medicine | Admitting: Emergency Medicine

## 2010-10-20 DIAGNOSIS — K047 Periapical abscess without sinus: Secondary | ICD-10-CM | POA: Insufficient documentation

## 2010-10-20 DIAGNOSIS — K0401 Reversible pulpitis: Secondary | ICD-10-CM | POA: Insufficient documentation

## 2010-10-20 DIAGNOSIS — K089 Disorder of teeth and supporting structures, unspecified: Secondary | ICD-10-CM | POA: Insufficient documentation

## 2012-02-17 ENCOUNTER — Emergency Department (HOSPITAL_COMMUNITY)
Admission: EM | Admit: 2012-02-17 | Discharge: 2012-02-17 | Disposition: A | Discharge status: Home / Self Care | Payer: Self-pay | Attending: Emergency Medicine | Admitting: Emergency Medicine

## 2012-02-17 ENCOUNTER — Encounter (HOSPITAL_COMMUNITY): Payer: Self-pay | Admitting: Emergency Medicine

## 2012-02-17 DIAGNOSIS — Z79899 Other long term (current) drug therapy: Secondary | ICD-10-CM | POA: Insufficient documentation

## 2012-02-17 DIAGNOSIS — H9190 Unspecified hearing loss, unspecified ear: Secondary | ICD-10-CM | POA: Insufficient documentation

## 2012-02-17 DIAGNOSIS — F17200 Nicotine dependence, unspecified, uncomplicated: Secondary | ICD-10-CM | POA: Insufficient documentation

## 2012-02-17 DIAGNOSIS — H60399 Other infective otitis externa, unspecified ear: Secondary | ICD-10-CM | POA: Insufficient documentation

## 2012-02-17 DIAGNOSIS — H6090 Unspecified otitis externa, unspecified ear: Secondary | ICD-10-CM

## 2012-02-17 DIAGNOSIS — J45909 Unspecified asthma, uncomplicated: Secondary | ICD-10-CM | POA: Insufficient documentation

## 2012-02-17 HISTORY — DX: Unspecified asthma, uncomplicated: J45.909

## 2012-02-17 MED ORDER — OFLOXACIN 0.3 % OT SOLN
5.0000 [drp] | Freq: Every day | OTIC | Status: DC
  Filled 2012-02-17: qty 5

## 2012-02-17 MED ORDER — OXYCODONE-ACETAMINOPHEN 5-325 MG PO TABS
2.0000 | ORAL_TABLET | Freq: Once | ORAL | Status: AC
  Administered 2012-02-17: 2 via ORAL
  Filled 2012-02-17: qty 2

## 2012-02-17 MED ORDER — OFLOXACIN 0.3 % OP SOLN
5.0000 [drp] | Freq: Every day | OPHTHALMIC | Status: DC
  Administered 2012-02-17: 5 [drp] via OTIC
  Filled 2012-02-17: qty 5

## 2012-02-17 MED ORDER — PERCOCET 5-325 MG PO TABS: 1.0000 | ORAL_TABLET | Freq: Four times a day (QID) | ORAL | Status: DC | PRN

## 2012-02-17 NOTE — ED Provider Notes (Signed)
History     CSN: [id number]  Arrival date & time 02/17/12  1258   First MD Initiated Contact with Patient 02/17/12 1437      No chief complaint on file.   (Consider location/radiation/quality/duration/timing/severity/associated sxs/prior treatment) Patient is a 47 y.o. female presenting with ear pain. The history is provided by the patient.  Otalgia This is a new problem. Episode onset: 3 days. There is pain in the right ear. The problem occurs constantly. The problem has been gradually worsening. There has been no fever. The pain is at a severity of 10/10. The pain is severe. Associated symptoms include hearing loss. Pertinent negatives include no ear discharge, no headaches, no rhinorrhea, no sore throat, no abdominal pain, no diarrhea, no vomiting, no neck pain, no cough and no rash. Her past medical history does not include chronic ear infection, hearing loss or tympanostomy tube.    Past Medical History  Diagnosis Date  . Asthma     No past surgical history on file.  No family history on file.  History  Substance Use Topics  . Smoking status: Current Every Day Smoker  . Smokeless tobacco: Not on file  . Alcohol Use: Yes    OB History    Grav Para Term Preterm Abortions TAB SAB Ect Mult Living                  Review of Systems  HENT: Positive for hearing loss and ear pain. Negative for sore throat, rhinorrhea, neck pain and ear discharge.   Respiratory: Negative for cough.   Gastrointestinal: Negative for vomiting, abdominal pain and diarrhea.  Skin: Negative for rash.  Neurological: Negative for headaches.  All other systems reviewed and are negative.    Allergies  Penicillins  Home Medications   Current Outpatient Rx  Name  Route  Sig  Dispense  Refill  . BC HEADACHE POWDER PO   Oral   Take 1 packet by mouth every 4 (four) hours as needed. For pain         . IBUPROFEN 200 MG PO TABS   Oral   Take 800 mg by mouth every 6 (six) hours as needed.  For pain           BP 135/68  Pulse 95  Temp 98.3 F (36.8 C)  Resp 16  SpO2 99%  Physical Exam  Nursing note and vitals reviewed. Constitutional: She is oriented to person, place, and time. She appears well-developed and well-nourished. No distress.  HENT:  Head: Normocephalic and atraumatic.  Left Ear: External ear normal.       Normal Oropharynx with out exudates, MMM. Left Ear canal with significant swelling & erythema, unable to visualize TM. No mastoid ttp or erythema. + pinna and tragal tenderness. Nasal congestion without sinus tenderness. Hearing = bilaterally.  Eyes: Conjunctivae normal and EOM are normal. Pupils are equal, round, and reactive to light.  Neck: Neck supple.       No nuchal rigidity, full normal ROM, pt can breath in extension without difficulty   Cardiovascular: Regular rhythm.        Tachycardic, other heart sounds normal  Pulmonary/Chest: Effort normal.       LCAB  Abdominal: Soft. Bowel sounds are normal.  Musculoskeletal: She exhibits no edema.       Diffuse myalgias, no bony ttp or decreased ROM  Neurological: She is oriented to person, place, and time.  Skin:       No rash, skin warm &  non diaphoretic    ED Course  Procedures (including critical care time)  Labs Reviewed - No data to display No results found.   No diagnosis found.    MDM  Otitis externa  Pt presenting with otitis externa. No canal occlusion, Pt afebrile in NAD. Exam non concerning for mastoiditis, cellulitis or malignant OE. Ear wick placed in ED. Dc with ofloxacin script.  Advised follow up in 2-3 days if no improvement with treatment or no complete resolution by 7 days. Earlier f-u if develops rash , allergic reaction to medication, or loss of hearing.         Jaci Carrel, [place] 02/17/12 1534

## 2012-02-17 NOTE — ED Notes (Signed)
Pt discharged.GCS 15.Stiil in pain

## 2012-02-17 NOTE — ED Notes (Signed)
Rt ear pain  X 3 days hurts to swollow on that side no drainage

## 2012-02-18 NOTE — ED Provider Notes (Signed)
Medical screening examination/treatment/procedure(s) were performed by non-physician practitioner and as supervising physician I was immediately available for consultation/collaboration.   Joya Gaskins, MD 02/18/12 2040

## 2012-04-27 ENCOUNTER — Emergency Department (HOSPITAL_COMMUNITY)
Admission: EM | Admit: 2012-04-27 | Discharge: 2012-04-27 | Disposition: A | Discharge status: Home / Self Care | Payer: Self-pay | Attending: Emergency Medicine | Admitting: Emergency Medicine

## 2012-04-27 ENCOUNTER — Encounter (HOSPITAL_COMMUNITY): Payer: Self-pay | Admitting: *Deleted

## 2012-04-27 DIAGNOSIS — R220 Localized swelling, mass and lump, head: Secondary | ICD-10-CM | POA: Insufficient documentation

## 2012-04-27 DIAGNOSIS — Z87828 Personal history of other (healed) physical injury and trauma: Secondary | ICD-10-CM | POA: Insufficient documentation

## 2012-04-27 DIAGNOSIS — F17200 Nicotine dependence, unspecified, uncomplicated: Secondary | ICD-10-CM | POA: Insufficient documentation

## 2012-04-27 DIAGNOSIS — J45909 Unspecified asthma, uncomplicated: Secondary | ICD-10-CM | POA: Insufficient documentation

## 2012-04-27 DIAGNOSIS — K047 Periapical abscess without sinus: Secondary | ICD-10-CM | POA: Insufficient documentation

## 2012-04-27 MED ORDER — IBUPROFEN 800 MG PO TABS
800.0000 mg | ORAL_TABLET | Freq: Once | ORAL | Status: AC
  Administered 2012-04-27: 800 mg via ORAL
  Filled 2012-04-27: qty 1

## 2012-04-27 MED ORDER — AMOXICILLIN 500 MG PO CAPS: 500.0000 mg | ORAL_CAPSULE | Freq: Three times a day (TID) | ORAL | Status: AC

## 2012-04-27 MED ORDER — IBUPROFEN 800 MG PO TABS: 800.0000 mg | ORAL_TABLET | Freq: Three times a day (TID) | ORAL | Status: DC

## 2012-04-27 MED ORDER — PROMETHAZINE HCL 25 MG PO TABS: 25.0000 mg | ORAL_TABLET | Freq: Four times a day (QID) | ORAL | Status: DC | PRN

## 2012-04-27 MED ORDER — AMOXICILLIN 250 MG PO CAPS
500.0000 mg | ORAL_CAPSULE | Freq: Once | ORAL | Status: AC
  Administered 2012-04-27: 500 mg via ORAL
  Filled 2012-04-27: qty 2

## 2012-04-27 NOTE — ED Provider Notes (Signed)
History     CSN: [id number]  Arrival date & time 04/27/12  1149   First MD Initiated Contact with Patient 04/27/12 1223      Chief Complaint  Patient presents with  . Dental Pain    (Consider location/radiation/quality/duration/timing/severity/associated sxs/prior treatment) HPI Comments: Marissa [name] is a 47 y.o. Female with a 1 day history of dental pain and gingival swelling.   The patient has a history of injury to the tooth involved, reporting she fractured the tooth ears ago but has never caused pain or swelling until recently.  There has been no fevers,  Chills, nausea or vomiting, also no complaint of difficulty swallowing,  Although chewing makes pain worse.  The patient has tried aspirin and ibuprofen without relief of symptoms.         The history is provided by the patient.    Past Medical History  Diagnosis Date  . Asthma     History reviewed. No pertinent past surgical history.  No family history on file.  History  Substance Use Topics  . Smoking status: Current Every Day Smoker  . Smokeless tobacco: Not on file  . Alcohol Use: Yes    OB History   Grav Para Term Preterm Abortions TAB SAB Ect Mult Living                  Review of Systems  Constitutional: Negative for fever and chills.  HENT: Positive for facial swelling and dental problem. Negative for congestion, sore throat, neck pain and neck stiffness.   Eyes: Negative.   Respiratory: Negative for shortness of breath.   Cardiovascular: Negative for chest pain.  Gastrointestinal: Negative for nausea.  Musculoskeletal: Negative for arthralgias.  Skin: Negative for color change.  Neurological: Negative for weakness and headaches.  Psychiatric/Behavioral: Negative.     Allergies  Penicillins  Home Medications   Current Outpatient Rx  Name  Route  Sig  Dispense  Refill  . aspirin (BAYER ASPIRIN) 325 MG tablet   Oral   Take 325 mg by mouth every 4 (four) hours as needed for pain.           . Aspirin-Salicylamide-Caffeine (BC HEADACHE POWDER PO)   Oral   Take 1 packet by mouth every 4 (four) hours as needed. For pain         . ibuprofen (ADVIL,MOTRIN) 200 MG tablet   Oral   Take 800 mg by mouth every 6 (six) hours as needed. For pain         . amoxicillin (AMOXIL) 500 MG capsule   Oral   Take 1 capsule (500 mg total) by mouth 3 (three) times daily.   30 capsule   0   . ibuprofen (ADVIL,MOTRIN) 800 MG tablet   Oral   Take 1 tablet (800 mg total) by mouth 3 (three) times daily.   21 tablet   0     BP 129/66  Pulse 98  Temp(Src) 98.1 F (36.7 C)  Resp 18  Ht 5\' 2"  (1.575 m)  Wt 165 lb (74.844 kg)  BMI 30.17 kg/m2  SpO2 100%  LMP 04/24/2012  Physical Exam  Constitutional: She is oriented to person, place, and time. She appears well-developed and well-nourished. No distress.  HENT:  Head: Normocephalic and atraumatic. No trismus in the jaw.  Right Ear: Tympanic membrane and external ear normal.  Left Ear: Tympanic membrane and external ear normal.  Mouth/Throat: Oropharynx is clear and moist and mucous membranes are normal. No oral  lesions. Dental abscesses present.    Eyes: Conjunctivae are normal.  Neck: Normal range of motion. Neck supple.  Cardiovascular: Normal rate and normal heart sounds.   Pulmonary/Chest: Effort normal.  Abdominal: She exhibits no distension.  Musculoskeletal: Normal range of motion.  Lymphadenopathy:    She has no cervical adenopathy.  Neurological: She is alert and oriented to person, place, and time.  Skin: Skin is warm and dry. No erythema.  Psychiatric: She has a normal mood and affect.    ED Course  Procedures (including critical care time)  Labs Reviewed - No data to display No results found.   1. Dental abscess       MDM  Pt does not have pcn allergy.  She states abx often makes her nauseated.  Will prescribe phenergan prn in addition to amoxil, ibuprofen.  Dental referrals  given.        Burgess Amor, PA-C 04/28/12 [phone]

## 2012-04-27 NOTE — ED Notes (Signed)
Pt reports left lower dental pain. Noted left lower swelling to jaw, and dental caries. Pt reports filling fell out about 10 years ago and has never had it fixed. Denies having a dentist , nor does she have money for a prescription. Pt asked if she could have a shot of penicillin instead of RX. However pt reports penicillin makes her sick. NAD noted at this time.

## 2012-04-27 NOTE — ED Notes (Signed)
Pt c/o left lower dental pain that started yesterday,

## 2012-04-28 NOTE — ED Provider Notes (Signed)
Medical screening examination/treatment/procedure(s) were performed by non-physician practitioner and as supervising physician I was immediately available for consultation/collaboration. Devoria Albe, MD, Armando Gang   Ward Givens, MD 04/28/12 [phone]

## 2012-06-11 ENCOUNTER — Emergency Department (HOSPITAL_COMMUNITY)
Admission: EM | Admit: 2012-06-11 | Discharge: 2012-06-11 | Disposition: A | Discharge status: Home / Self Care | Payer: Self-pay | Attending: Emergency Medicine | Admitting: Emergency Medicine

## 2012-06-11 ENCOUNTER — Encounter (HOSPITAL_COMMUNITY): Payer: Self-pay | Admitting: Emergency Medicine

## 2012-06-11 ENCOUNTER — Emergency Department (HOSPITAL_COMMUNITY): Payer: Self-pay

## 2012-06-11 DIAGNOSIS — S5010XA Contusion of unspecified forearm, initial encounter: Secondary | ICD-10-CM | POA: Insufficient documentation

## 2012-06-11 DIAGNOSIS — T148XXA Other injury of unspecified body region, initial encounter: Secondary | ICD-10-CM

## 2012-06-11 DIAGNOSIS — W208XXA Other cause of strike by thrown, projected or falling object, initial encounter: Secondary | ICD-10-CM | POA: Insufficient documentation

## 2012-06-11 DIAGNOSIS — Y939 Activity, unspecified: Secondary | ICD-10-CM | POA: Insufficient documentation

## 2012-06-11 DIAGNOSIS — M79601 Pain in right arm: Secondary | ICD-10-CM

## 2012-06-11 DIAGNOSIS — Y929 Unspecified place or not applicable: Secondary | ICD-10-CM | POA: Insufficient documentation

## 2012-06-11 DIAGNOSIS — J45909 Unspecified asthma, uncomplicated: Secondary | ICD-10-CM | POA: Insufficient documentation

## 2012-06-11 DIAGNOSIS — F17200 Nicotine dependence, unspecified, uncomplicated: Secondary | ICD-10-CM | POA: Insufficient documentation

## 2012-06-11 DIAGNOSIS — Z880 Allergy status to penicillin: Secondary | ICD-10-CM | POA: Insufficient documentation

## 2012-06-11 NOTE — ED Provider Notes (Signed)
History    This chart was scribed for Marissa Silk, PA working with Geoffery Lyons, MD by ED Scribe, Burman Nieves. This patient was seen in room WTR9/WTR9 and the patient's care was started at 6:31 PM.   CSN: [id number]  Arrival date & time 06/11/12  1806   First MD Initiated Contact with Patient 06/11/12 1831      Chief Complaint  Patient presents with  . Arm Pain    (Consider location/radiation/quality/duration/timing/severity/associated sxs/prior treatment) Patient is a 47 y.o. female presenting with arm pain. The history is provided by the patient. No language interpreter was used.  Arm Pain This is a new problem. The current episode started 2 days ago. The problem occurs constantly. The problem has been gradually worsening. Exacerbated by: movement. Nothing relieves the symptoms.   HPI Comments: Marissa [name] is a 47 y.o. female who presents to the Emergency Department complaining of moderate constant right forearm pain due to a 150 lb tv falling on her right arm last Wednesday. The pain radiates up and down her right forearm. Movement seems to exacerbate the pain in her right forearm. She has been taking BC powder with no immediate relief. She states that since the incident the pain has only become more persistent. Pt denies fever, chills, cough, nausea, vomiting, diarrhea, SOB, weakness, and any other associated symptoms.    Past Medical History  Diagnosis Date  . Asthma     History reviewed. No pertinent past surgical history.  No family history on file.  History  Substance Use Topics  . Smoking status: Current Every Day Smoker  . Smokeless tobacco: Not on file  . Alcohol Use: Yes    OB History   Grav Para Term Preterm Abortions TAB SAB Ect Mult Living                  Review of Systems  Musculoskeletal: Positive for myalgias.  All other systems reviewed and are negative.    Allergies  Penicillins  Home Medications   Current Outpatient Rx  Name  Route  Sig   Dispense  Refill  . Aspirin-Salicylamide-Caffeine (BC HEADACHE POWDER PO)   Oral   Take 1 packet by mouth every 4 (four) hours as needed. For pain           BP 132/85  Pulse 96  Temp(Src) 98.1 F (36.7 C) (Oral)  Resp 18  Wt 154 lb 2 oz (69.911 kg)  BMI 28.18 kg/m2  SpO2 96%  LMP 04/11/2012  Physical Exam  Nursing note and vitals reviewed. Constitutional: She is oriented to person, place, and time. She appears well-developed and well-nourished. No distress.  HENT:  Head: Normocephalic and atraumatic.  Right Ear: External ear normal.  Left Ear: External ear normal.  Nose: Nose normal.  Mouth/Throat: Oropharynx is clear and moist.  Eyes: Conjunctivae are normal.  Neck: Normal range of motion.  Cardiovascular: Normal rate, regular rhythm and normal heart sounds.   Pulmonary/Chest: Effort normal and breath sounds normal. No stridor. No respiratory distress. She has no wheezes. She has no rales.  Abdominal: Soft. She exhibits no distension.  Musculoskeletal: She exhibits tenderness.  ROM to her right elbow is normal. ROM to her right wrist decreased to mild swelling and pain. Tender upon palpation to right forearm.  Neurological: She is alert and oriented to person, place, and time. She has normal strength.  Skin: Skin is warm and dry. She is not diaphoretic. No erythema.  Bruising to her right forearm.  Psychiatric:  She has a normal mood and affect. Her behavior is normal.    ED Course  Procedures (including critical care time) DIAGNOSTIC STUDIES: Oxygen Saturation is 96% on room air, adequate by my interpretation.    COORDINATION OF CARE: 6:45 PM Discussed ED treatment with pt and pt agrees.     Labs Reviewed - No data to display Dg Forearm Right  06/11/2012   *RADIOLOGY REPORT*  Clinical Data: TV fell on patient's arm 2 days ago.  Pain and bruising.  RIGHT FOREARM - 2 VIEW  Comparison: None.  Findings: Bony mineralization is normal.  No acute or healing fracture or  focal bony abnormality is identified.  IMPRESSION: No acute bony abnormality.   Original Report Authenticated By: Britta Mccreedy, M.D.     1. Contusion   2. Arm pain, right       MDM  Patient presents with right arm pain after a TV fell on her arm 2 days ago. She has had increased pain and bruising. X-ray negative for fracture. Discussed rest, ice, compression, elevation. Take Tylenol and Advil for your pain. Compartments soft, neurovascularly intact. Resource guide given so patient can establish care in the community. Return instructions discussed. Vital signs stable for discharge. Patient / Family / Caregiver informed of clinical course, understand medical decision-making process, and agree with plan.      I personally performed the services described in this documentation, which was scribed in my presence. The recorded information has been reviewed and is accurate.     Mora Bellman, PA-C 06/11/12 2304

## 2012-06-11 NOTE — ED Notes (Signed)
Per pt, states TV fell on right arm-c/o pain, bruising

## 2012-06-11 NOTE — ED Provider Notes (Signed)
Medical screening examination/treatment/procedure(s) were performed by non-physician practitioner and as supervising physician I was immediately available for consultation/collaboration.  Nijel Flink, MD 06/11/12 2323 

## 2012-09-08 ENCOUNTER — Encounter (HOSPITAL_COMMUNITY): Payer: Self-pay | Admitting: Emergency Medicine

## 2012-09-08 ENCOUNTER — Emergency Department (HOSPITAL_COMMUNITY)
Admission: EM | Admit: 2012-09-08 | Discharge: 2012-09-08 | Disposition: A | Discharge status: Home / Self Care | Payer: Self-pay | Attending: Emergency Medicine | Admitting: Emergency Medicine

## 2012-09-08 DIAGNOSIS — Z7982 Long term (current) use of aspirin: Secondary | ICD-10-CM | POA: Insufficient documentation

## 2012-09-08 DIAGNOSIS — K089 Disorder of teeth and supporting structures, unspecified: Secondary | ICD-10-CM | POA: Insufficient documentation

## 2012-09-08 DIAGNOSIS — K0889 Other specified disorders of teeth and supporting structures: Secondary | ICD-10-CM

## 2012-09-08 DIAGNOSIS — K029 Dental caries, unspecified: Secondary | ICD-10-CM | POA: Insufficient documentation

## 2012-09-08 DIAGNOSIS — J45909 Unspecified asthma, uncomplicated: Secondary | ICD-10-CM | POA: Insufficient documentation

## 2012-09-08 DIAGNOSIS — Z880 Allergy status to penicillin: Secondary | ICD-10-CM | POA: Insufficient documentation

## 2012-09-08 DIAGNOSIS — F17200 Nicotine dependence, unspecified, uncomplicated: Secondary | ICD-10-CM | POA: Insufficient documentation

## 2012-09-08 MED ORDER — OXYCODONE-ACETAMINOPHEN 5-325 MG PO TABS
2.0000 | ORAL_TABLET | Freq: Once | ORAL | Status: AC
  Administered 2012-09-08: 2 via ORAL
  Filled 2012-09-08: qty 2

## 2012-09-08 MED ORDER — OXYCODONE-ACETAMINOPHEN 5-325 MG PO TABS
1.0000 | ORAL_TABLET | Freq: Four times a day (QID) | ORAL | Status: DC | PRN
Start: 2012-09-08 — End: 2013-03-06

## 2012-09-08 MED ORDER — AMOXICILLIN 500 MG PO CAPS: 500.0000 mg | ORAL_CAPSULE | Freq: Three times a day (TID) | ORAL | Status: DC

## 2012-09-08 NOTE — Progress Notes (Signed)
[facility] provided patient with a list of primary care resources, dental resources, and a [facility] [employer] [facility] application.  °

## 2012-09-08 NOTE — ED Provider Notes (Signed)
CSN: [id number]     Arrival date & time 09/08/12  1256 History     First MD Initiated Contact with Patient 09/08/12 1344     Chief Complaint  Patient presents with  . Dental Pain   (Consider location/radiation/quality/duration/timing/severity/associated sxs/prior Treatment) HPI  Patient presents to the emergency department with a dental complaint. Symptoms began 2 days ago. The patient has tried to alleviate pain with BC powders.  Pain rated at a 10/10, characterized as throbbing in nature and located right lower molar. Patient denies fever, night sweats, chills, difficulty swallowing or opening mouth, SOB, nuchal rigidity or decreased ROM of neck.  Patient does not have a dentist and requests a resource guide at discharge.    Past Medical History  Diagnosis Date  . Asthma    History reviewed. No pertinent past surgical history. No family history on file. History  Substance Use Topics  . Smoking status: Current Every Day Smoker  . Smokeless tobacco: Not on file  . Alcohol Use: Yes   OB History   Grav Para Term Preterm Abortions TAB SAB Ect Mult Living                 Review of Systems ROS is negative unless otherwise stated in the HPI  Allergies  Penicillins  Home Medications   Current Outpatient Rx  Name  Route  Sig  Dispense  Refill  . aspirin 325 MG tablet   Oral   Take 325 mg by mouth daily.         . Aspirin-Salicylamide-Caffeine (BC HEADACHE POWDER PO)   Oral   Take 1 packet by mouth every 4 (four) hours as needed. For pain          BP 137/81  Pulse 86  Temp(Src) 98.5 F (36.9 C)  Resp 16  SpO2 92% Physical Exam  Constitutional: She appears well-developed and well-nourished. No distress.  HENT:  Head: Normocephalic and atraumatic.  Mouth/Throat: Uvula is midline, oropharynx is clear and moist and mucous membranes are normal. Normal dentition. Dental caries (Pts tooth shows no obvious abscess but moderate to severe tenderness to palpation of  marked tooth) present. No edematous.    Eyes: Pupils are equal, round, and reactive to light.  Neck: Trachea normal, normal range of motion and full passive range of motion without pain. Neck supple.  Cardiovascular: Normal rate, regular rhythm, normal heart sounds and normal pulses.   Pulmonary/Chest: Effort normal and breath sounds normal. No respiratory distress. Chest wall is not dull to percussion. She exhibits no tenderness, no crepitus, no edema, no deformity and no retraction.  Abdominal: Normal appearance.  Musculoskeletal: Normal range of motion.  Neurological: She is alert. She has normal strength.  Skin: Skin is warm, dry and intact. She is not diaphoretic.  Psychiatric: She has a normal mood and affect. Her speech is normal. Cognition and memory are normal.    ED Course   Procedures (including critical care time)  Labs Reviewed - No data to display No results found. 1. Toothache     MDM  Patient has dental pain. No emergent s/sx's present. Patent airway. No trismus.  Will be given pain medication and antibiotics. I discussed the need to call dentist within 24/48 hours for follow-up. Dental referral given. Return to ED precautions given.  Pt voiced understanding and has agreed to follow-up.  Pt has penicillin allergy but informs me she takes amoxicillin without any problems.  Dorthula Matas, PA-C 09/08/12 1456  Demarie Uhlig Irine Seal,  PA-C 09/08/12 1457

## 2012-09-08 NOTE — ED Notes (Signed)
Pt c/o l dental abscess x1 day.  Reports that pain is radiating to ear.

## 2012-09-10 NOTE — ED Provider Notes (Signed)
Medical screening examination/treatment/procedure(s) were performed by non-physician practitioner and as supervising physician I was immediately available for consultation/collaboration.  Anusha Claus, MD 09/10/12 1810 

## 2013-03-06 ENCOUNTER — Emergency Department (HOSPITAL_COMMUNITY)
Admission: EM | Admit: 2013-03-06 | Discharge: 2013-03-06 | Disposition: A | Discharge status: Home / Self Care | Payer: Self-pay | Attending: Emergency Medicine | Admitting: Emergency Medicine

## 2013-03-06 ENCOUNTER — Encounter (HOSPITAL_COMMUNITY): Payer: Self-pay | Admitting: Emergency Medicine

## 2013-03-06 ENCOUNTER — Emergency Department (HOSPITAL_COMMUNITY): Payer: Self-pay

## 2013-03-06 DIAGNOSIS — R079 Chest pain, unspecified: Secondary | ICD-10-CM | POA: Insufficient documentation

## 2013-03-06 DIAGNOSIS — R110 Nausea: Secondary | ICD-10-CM | POA: Insufficient documentation

## 2013-03-06 DIAGNOSIS — F17200 Nicotine dependence, unspecified, uncomplicated: Secondary | ICD-10-CM | POA: Insufficient documentation

## 2013-03-06 DIAGNOSIS — J45901 Unspecified asthma with (acute) exacerbation: Secondary | ICD-10-CM | POA: Insufficient documentation

## 2013-03-06 DIAGNOSIS — Z880 Allergy status to penicillin: Secondary | ICD-10-CM | POA: Insufficient documentation

## 2013-03-06 DIAGNOSIS — K0889 Other specified disorders of teeth and supporting structures: Secondary | ICD-10-CM

## 2013-03-06 DIAGNOSIS — K089 Disorder of teeth and supporting structures, unspecified: Secondary | ICD-10-CM | POA: Insufficient documentation

## 2013-03-06 LAB — BASIC METABOLIC PANEL
BUN: 7 mg/dL (ref 6–23)
CO2: 24 mEq/L (ref 19–32)
Calcium: 9.2 mg/dL (ref 8.4–10.5)
Chloride: 103 mEq/L (ref 96–112)
Creatinine, Ser: 0.52 mg/dL (ref 0.50–1.10)
GFR calc Af Amer: >90 mL/min (ref >90)
GFR calc non Af Amer: >90 mL/min (ref >90)
Glucose, Bld: 107 mg/dL — ABNORMAL HIGH (ref 70–99)
Potassium: 3.8 mEq/L (ref 3.7–5.3)
Sodium: 139 mEq/L (ref 137–147)

## 2013-03-06 LAB — POCT I-STAT TROPONIN I
Troponin i, poc: 0 ng/mL (ref 0.00–0.08)
Troponin i, poc: 0 ng/mL (ref 0.00–0.08)

## 2013-03-06 LAB — CBC WITH DIFFERENTIAL/PLATELET
Basophils Absolute: 0.1 10*3/uL (ref 0.0–0.1)
Basophils Relative: 1 % (ref 0–1)
Eosinophils Absolute: 0.2 10*3/uL (ref 0.0–0.7)
Eosinophils Relative: 3 % (ref 0–5)
HCT: 45.8 % (ref 36.0–46.0)
Hemoglobin: 15 g/dL (ref 12.0–15.0)
Lymphocytes Relative: 23 % (ref 12–46)
Lymphs Abs: 1.7 10*3/uL (ref 0.7–4.0)
MCH: 29.5 pg (ref 26.0–34.0)
MCHC: 32.8 g/dL (ref 30.0–36.0)
MCV: 90.2 fL (ref 78.0–100.0)
Monocytes Absolute: 0.7 10*3/uL (ref 0.1–1.0)
Monocytes Relative: 10 % (ref 3–12)
Neutro Abs: 4.6 10*3/uL (ref 1.7–7.7)
Neutrophils Relative %: 64 % (ref 43–77)
Platelets: 367 10*3/uL (ref 150–400)
RBC: 5.08 MIL/uL (ref 3.87–5.11)
RDW: 14.3 % (ref 11.5–15.5)
WBC: 7.3 10*3/uL (ref 4.0–10.5)

## 2013-03-06 MED ORDER — TRAMADOL HCL 50 MG PO TABS: 50.0000 mg | ORAL_TABLET | Freq: Four times a day (QID) | ORAL | Status: DC | PRN

## 2013-03-06 MED ORDER — PROMETHAZINE HCL 25 MG PO TABS: 25.0000 mg | ORAL_TABLET | Freq: Four times a day (QID) | ORAL | Status: DC | PRN

## 2013-03-06 MED ORDER — ASPIRIN 81 MG PO CHEW
324.0000 mg | CHEWABLE_TABLET | Freq: Once | ORAL | Status: AC
  Administered 2013-03-06: 324 mg via ORAL
  Filled 2013-03-06: qty 4

## 2013-03-06 MED ORDER — PENICILLIN V POTASSIUM 500 MG PO TABS: 500.0000 mg | ORAL_TABLET | Freq: Four times a day (QID) | ORAL | Status: DC

## 2013-03-06 NOTE — ED Notes (Signed)
Pt c/o intermittent central chest pain and nausea x 2 weeks.  Pain score 9/10.  Hx of smoking.

## 2013-03-06 NOTE — Discharge Instructions (Signed)
Take Veetid as directed until gone. Take Phenergan as needed for nausea. Take tramadol as needed for pain. Refer to attached documents for more information. Follow up with a doctor from the resource guide. Return to the ED with worsening or concerning symptoms.    Emergency Department Resource Guide 1) Find a Doctor and Pay Out of Pocket Although you won't have to find out who is covered by your insurance plan, it is a good idea to ask around and get recommendations. You will then need to call the office and see if the doctor you have chosen will accept you as a new patient and what types of options they offer for patients who are self-pay. Some doctors offer discounts or will set up payment plans for their patients who do not have insurance, but you will need to ask so you aren't surprised when you get to your appointment.  2) Contact Your Local Health Department Not all health departments have doctors that can see patients for sick visits, but many do, so it is worth a call to see if yours does. If you don't know where your local health department is, you can check in your phone book. The CDC also has a tool to help you locate your state's health department, and many state websites also have listings of all of their local health departments.  3) Find a [facility] If your illness is not likely to be very severe or complicated, you may want to try a walk in clinic. These are popping up all over the country in pharmacies, drugstores, and shopping centers. They're usually staffed by nurse practitioners or physician assistants that have been trained to treat common illnesses and complaints. They're usually fairly quick and inexpensive. However, if you have serious medical issues or chronic medical problems, these are probably not your best option.  No Primary Care Doctor: - Call Health Connect at  [phone] - they can help you locate a primary care doctor that  accepts your insurance, provides certain  services, etc. - Physician Referral Service- [phone]  Chronic Pain Problems: Organization         Address  Phone   Notes  [facility]  [phone] Patients need to be referred by their primary care doctor.   Medication Assistance: Organization         Address  Phone   Notes  [facility] Medication [facility] [place], [place] [address] [phone] --Must be a resident of [facility] -- Must have NO insurance coverage whatsoever (no Medicaid/ Medicare, etc.) -- The pt. MUST have a primary care doctor that directs their care regularly and follows them in the community   MedAssist  [phone]   [employer]  [phone]    Agencies that provide inexpensive medical care: Organization         Address  Phone   Notes  [place]  [phone]   [facility]    [phone]   [facility] [place], [place] [address] [phone]   [place] [address], [place] [phone]   Planned Parenthood    [phone]   [facility]    [phone]   [place] and [place] [address], [place] Phone:  [phone], Fax:  [phone] Hours of Operation:  9 am - 6 pm, M-F.  Also accepts Medicaid/Medicare and self-pay.  [facility] for [address], [address], [place] Phone: [phone], Fax: [phone]. Hours of Operation:  8:30 am - 5:30 pm, M-F.  Also accepts Medicaid and self-pay.  [facility] [address] [place] Phone: [phone]   [place], [place], [place] [phone], Ext. [phone] Mondays & Thursdays: 7-9 AM.  First 15 patients are seen on a first come, first serve basis.    [place] Providers:  Organization         Address  Phone   Notes  [facility] [address], [place] [phone] Also accepts self-pay patients.  [facility] [address], [place]  [phone]   [place], [address], [place] [phone]   [facility] [address], [place] [phone]   Lucianne Lei [address], [place]   [phone] Only accepts [place] Access [place] patients after they have their name applied to their card.   Self-Pay (no insurance) in [facility] Xxx:  Organization         Address  Phone   Notes  Sickle Cell Patients, [facility] [place] [phone]   [facility] [place] [phone]   [facility] [place]  [place], [place], [place] [phone]   [facility]/Dr. Osei-Bonsu  [address] [place] or [address], [place] [phone] Phone number for both [place] and [place] locations is the same.  Urgent Medical and [facility] [address], [place] [phone]   [facility] [address] [place] or [address] [phone]   [facility] [address] [place] [phone], phone; [phone], fax Sees patients 1st and 3rd Saturday of every month.  Must not qualify for public or private insurance (i.e. Medicaid, Medicare, [place] Health Choice, Veterans' Benefits)  Household income should be no more than 200% of the poverty level The clinic cannot treat you if you are pregnant or think you are pregnant  Sexually transmitted diseases are not treated at the clinic.    Dental Care: Organization         Address  Phone  Notes  [facility] Department of [facility] [place] [phone] Accepts  children up to age 57 who are enrolled in [place] or [place]; pregnant women with a Medicaid card; and children who have applied for Medicaid or [place] Health Choice, but were declined, whose parents can pay a reduced fee at time of service.  [facility] Department of [facility]  [address], [place] [phone] Accepts children up to age 80 who are enrolled in [place] or [place]; pregnant women with a Medicaid card; and children who have applied for Medicaid or [place] Health Choice, but were declined, whose parents can pay a reduced fee at time of service.  [facility] Access PROGRAM  [place] [phone] Patients are seen by appointment only. Walk-ins are not accepted. [place] will see patients 42 years of age and older. Monday - Tuesday (  8am-5pm) Most Wednesdays (8:30-5pm) $30 per visit, cash only  [facility] Adult Dental Access PROGRAM  [address], [facility] [phone] Patients are seen by appointment only. Walk-ins are not accepted. [place] will see patients 78 years of age and older. One Wednesday Evening (Monthly: Volunteer Based).  $30 per visit, cash only  [place]  [phone] for adults; Children under age 55, call Graduate Pediatric Dentistry at [phone]. Children aged 17-14, please call [phone] to request a pediatric application.  Dental services are provided in all areas of dental care including fillings, crowns and bridges, complete and partial dentures, implants, gum treatment, root canals, and extractions. Preventive care is also provided. Treatment is provided to both adults and children. Patients are selected via a lottery and there is often a waiting list.   [facility], [place] [address], [place]  [phone] [email or web address]   [facility] [address] [place], [place] [phone], Ext. [phone] Second and  Fourth Thursday of each month, opens at 6:30 AM; Clinic ends at 9 AM.  Patients are seen on a first-come first-served basis, and a limited number are seen during each clinic.   [facility]  [address] Hillard Danker [place], [place] [phone]   Eligibility Requirements You must have lived in [place], [place], or [place] counties for at least the last three months.   You cannot be eligible for state or federal sponsored [employer], including [employer], [place], or [employer].   You generally cannot be eligible for healthcare insurance through your employer.    How to apply: Eligibility screenings are held every Tuesday and Wednesday afternoon from 1:00 pm until 4:00 pm. You do not need an appointment for the interview!  [facility] [address] [place], [place]   Head of the Harbor  [place] Department  [place]  [phone]    Behavioral Health Resources in the Community: Intensive Outpatient Programs Organization         Address  Phone  Notes  [place]. [address] [place], [place] [phone]   [facility] [address], [place], [place]   ADS: Alcohol & Drug Svcs [address] [place], [place]   [place] [address],  [place], [place] or [phone]   Substance Abuse Resources Organization         Address  Phone  Notes  Alcohol and Drug Services  [phone]   [place]  [phone]   [facility] [place]   Chinita Pester  [phone]   Residential & Outpatient Substance Abuse Program  [phone]   Psychological Services Organization         Address  Phone  Notes  [facility] [place]  [place]  [phone]   [place]  [address], [place] or [phone]    Mobile Crisis Teams Organization         Address  Phone  Notes  [facility], Mobile Crisis Care Unit  [phone]   [facility]otherapeutic [facility]  [address] [place], [place]   [place] [address], [place] [phone]    Self-Help/Support Groups Organization         Address  Phone  Notes  Mental Health Assoc. of [place] - variety of support groups  [place] Call for more information  Narcotics Anonymous (NA), [facility] [address], [employer] [place]  2 meetings at this location   Special educational needs teacher         Address  Phone  Notes  [facility] Residential Treatment [place],    [place]  [phone]   [facility]  [address] [place] [phone], [place], [place]   [place], [place] [phone] Admissions: 8am-3pm M-F  Incentives Substance [place] [address]    [place], [place] [phone]   [facility] [address] [place], [place], [place]   [facility] [address],  [place], [place]   [facility] - Intensive Outpatient [address] Kristeen [address], [place], [place]   [facility] [place].,  [place], [place] [phone] or [phone]   [facility] (RTS) [address] [place], [place] Accepts Medicaid  [facility] [facility] [address],  [place] [phone] Substance Abuse/Addiction Treatment   [facility] Organization         Address  Phone  Notes  [facility]  [phone]   Domenic Schwab, PhD [address] Arlis Porta [place], [place]   [phone] or [phone]   [place], [place] [phone]   [facility] [address] [place], [place] [phone] Insurance/Medicaid/sponsorship through [facility] and Families [address], [address] [place]                                    [place], [place] [phone] [place] [address], [place] [phone]    Dr. Adele Schilder  [phone]   [facility] Dept. 1) [address] [place] 2) [place] 3)  [place] [address], [place] [phone]  [phone]   [place] [phone] or [phone] (After Hours)

## 2013-03-06 NOTE — ED Notes (Signed)
She tells me she has had intermittent, central chest area "squeezing" discomfort x ~3 weeks.  She states this discomfort awakened her at ~0100 today; and has been occurring more frequently than previously ever since.  She further tells me that her mother "passed away of heart trouble; so I'm really worried about this.  She expresses financial concerns about the need to possibly see a cardiologist, citing inabiltiy to pay and no job or insurance.  Currently she is awake, alert and in no distress and is breathing normally.  Monitor shows nsr without ectopy.

## 2013-03-06 NOTE — ED Provider Notes (Signed)
Medical screening examination/treatment/procedure(s) were performed by non-physician practitioner and as supervising physician I was immediately available for consultation/collaboration.  EKG Interpretation   None        Leota Jacobsen, MD 03/06/13 2329

## 2013-03-06 NOTE — ED Provider Notes (Signed)
CSN: [id number]     Arrival date & time 03/06/13  1424 History   First MD Initiated Contact with Patient 03/06/13 [phone]     Chief Complaint  Patient presents with  . Chest Pain   (Consider location/radiation/quality/duration/timing/severity/associated sxs/prior Treatment) HPI Comments: Patient is a 48 year old female with a past medical history of asthma who presents with chest pain for the past 2 weeks. The pain has been intermittent without known trigger. The pain starts suddenly and is located in her central chest without radiation. The pain is described as pressure and will last hours at a time and resolve spontaneously without intervention. The most recent episode started around 1am suddenly and has been gradually improving for the past 15 hours. Patient reports some associated nausea. Patient also reports SOB, however, this is not new due to smoking history and asthma history. No aggravating/alleviating factors. Patient is a smoker. No other associated symptoms.    Past Medical History  Diagnosis Date  . Asthma    Past Surgical History  Procedure Laterality Date  . Tubal ligation     History reviewed. No pertinent family history. History  Substance Use Topics  . Smoking status: Current Every Day Smoker -- 0.50 packs/day    Types: Cigarettes  . Smokeless tobacco: Never Used  . Alcohol Use: Yes   OB History   Grav Para Term Preterm Abortions TAB SAB Ect Mult Living                 Review of Systems  Constitutional: Negative for fever, chills and fatigue.  HENT: Negative for trouble swallowing.   Eyes: Negative for visual disturbance.  Respiratory: Negative for shortness of breath.   Cardiovascular: Positive for chest pain. Negative for palpitations.  Gastrointestinal: Positive for nausea. Negative for vomiting, abdominal pain and diarrhea.  Genitourinary: Negative for dysuria and difficulty urinating.  Musculoskeletal: Negative for arthralgias and neck pain.  Skin: Negative  for color change.  Neurological: Negative for dizziness and weakness.  Psychiatric/Behavioral: Negative for dysphoric mood.    Allergies  Penicillins  Home Medications   Current Outpatient Rx  Name  Route  Sig  Dispense  Refill  . acetaminophen (TYLENOL) 500 MG tablet   Oral   Take 1,000 mg by mouth every 6 (six) hours as needed for mild pain or headache.         Marland Kitchen aspirin 325 MG tablet   Oral   Take 325 mg by mouth 2 (two) times daily as needed.          . Aspirin-Salicylamide-Caffeine (BC HEADACHE POWDER PO)   Oral   Take 1 packet by mouth every 4 (four) hours as needed. For pain          BP 148/96  Pulse 99  Temp(Src) 98 F (36.7 C) (Oral)  Resp 18  SpO2 97% Physical Exam  Nursing note and vitals reviewed. Constitutional: She is oriented to person, place, and time. She appears well-developed and well-nourished. No distress.  HENT:  Head: Normocephalic and atraumatic.  Eyes: Conjunctivae and EOM are normal.  Neck: Normal range of motion.  Cardiovascular: Normal rate and regular rhythm.  Exam reveals no gallop and no friction rub.   No murmur heard. Pulmonary/Chest: Effort normal. She has wheezes. She has no rales. She exhibits no tenderness.  Bilateral upper lung field inspiratory and expiratory wheezes.   Abdominal: Soft. She exhibits no distension. There is no tenderness. There is no rebound.  Musculoskeletal: Normal range of motion.  Neurological: She is alert and oriented to person, place, and time. Coordination normal.  Speech is goal-oriented. Moves limbs without ataxia.   Skin: Skin is warm and dry.  Psychiatric: She has a normal mood and affect. Her behavior is normal.    ED Course  Procedures (including critical care time) Labs Review Labs Reviewed  CBC WITH DIFFERENTIAL  BASIC METABOLIC PANEL  POCT I-STAT TROPONIN I   Imaging Review Dg Chest 2 View  03/06/2013   CLINICAL DATA:  Cough and chest pain  EXAM: CHEST  2 VIEW  COMPARISON:  None.   FINDINGS: Lungs are clear. Heart size and pulmonary vascularity are normal. No adenopathy. No pneumothorax. There is a bony bridge connecting the right second and third ribs, an unusual anatomic variant.  IMPRESSION: No edema or consolidation.   Electronically Signed   By: Lowella Grip M.D.   On: 03/06/2013 15:35    EKG Interpretation   None       MDM   1. Chest pain   2. Pain, dental     4:05 PM Labs and chest xray unremarkable for acute changes. Vitals stable and patient afebrile. Patient not having chest pain at this time.   4:43 PM Patient will have delta troponin. Patient is PERC negative. If troponin is negative, patient can be discharged with recommendation to follow up with PCP on resource guide. Patient sounds non cardiac and may be related to her asthma.   6:38 PM Troponin negative. Patient is requesting antibiotics for her dental pain. Patient will have a resource guide with recommended follow up with a dentist and PCP. Patient will return with worsening or concerning symptoms.   Alvina Chou, [place] 03/06/13 1839

## 2013-08-19 ENCOUNTER — Encounter (HOSPITAL_COMMUNITY): Payer: Self-pay | Admitting: Emergency Medicine

## 2013-08-19 ENCOUNTER — Emergency Department (HOSPITAL_COMMUNITY)
Admission: EM | Admit: 2013-08-19 | Discharge: 2013-08-19 | Disposition: A | Discharge status: Home / Self Care | Payer: Self-pay | Attending: Emergency Medicine | Admitting: Emergency Medicine

## 2013-08-19 DIAGNOSIS — F17200 Nicotine dependence, unspecified, uncomplicated: Secondary | ICD-10-CM | POA: Insufficient documentation

## 2013-08-19 DIAGNOSIS — T7840XA Allergy, unspecified, initial encounter: Secondary | ICD-10-CM

## 2013-08-19 DIAGNOSIS — J45909 Unspecified asthma, uncomplicated: Secondary | ICD-10-CM | POA: Insufficient documentation

## 2013-08-19 DIAGNOSIS — T4995XA Adverse effect of unspecified topical agent, initial encounter: Secondary | ICD-10-CM | POA: Insufficient documentation

## 2013-08-19 DIAGNOSIS — Z880 Allergy status to penicillin: Secondary | ICD-10-CM | POA: Insufficient documentation

## 2013-08-19 DIAGNOSIS — L509 Urticaria, unspecified: Secondary | ICD-10-CM | POA: Insufficient documentation

## 2013-08-19 DIAGNOSIS — R21 Rash and other nonspecific skin eruption: Secondary | ICD-10-CM | POA: Insufficient documentation

## 2013-08-19 MED ORDER — DIPHENHYDRAMINE HCL 25 MG PO TABS: 25.0000 mg | ORAL_TABLET | Freq: Four times a day (QID) | ORAL | Status: DC | PRN

## 2013-08-19 MED ORDER — DIPHENHYDRAMINE HCL 25 MG PO CAPS
25.0000 mg | ORAL_CAPSULE | Freq: Once | ORAL | Status: AC
  Administered 2013-08-19: 25 mg via ORAL
  Filled 2013-08-19: qty 1

## 2013-08-19 MED ORDER — PREDNISONE 20 MG PO TABS: 40.0000 mg | ORAL_TABLET | Freq: Every day | ORAL | Status: DC

## 2013-08-19 MED ORDER — PREDNISONE 20 MG PO TABS
60.0000 mg | ORAL_TABLET | Freq: Once | ORAL | Status: AC
  Administered 2013-08-19: 60 mg via ORAL
  Filled 2013-08-19: qty 3

## 2013-08-19 NOTE — ED Provider Notes (Signed)
Medical screening examination/treatment/procedure(s) were performed by non-physician practitioner and as supervising physician I was immediately available for consultation/collaboration.   EKG Interpretation None        Neta Ehlers, MD 08/19/13 1921

## 2013-08-19 NOTE — Discharge Instructions (Signed)
Please follow up with your primary care physician in 1-2 days. If you do not have one please call the [place] number listed above. Please take medications as prescribed. Please read all discharge instructions and return precautions.    Allergies Allergies may happen from anything your body is sensitive to. This may be food, medicines, pollens, chemicals, and nearly anything around you in everyday life that produces allergens. An allergen is anything that causes an allergy producing substance. Heredity is often a factor in causing these problems. This means you may have some of the same allergies as your parents. Food allergies happen in all age groups. Food allergies are some of the most severe and life threatening. Some common food allergies are cow's milk, seafood, eggs, nuts, wheat, and soybeans. SYMPTOMS   Swelling around the mouth.  An itchy red rash or hives.  Vomiting or diarrhea.  Difficulty breathing. SEVERE ALLERGIC REACTIONS ARE LIFE-THREATENING. This reaction is called anaphylaxis. It can cause the mouth and throat to swell and cause difficulty with breathing and swallowing. In severe reactions only a trace amount of food (for example, peanut oil in a salad) may cause death within seconds. Seasonal allergies occur in all age groups. These are seasonal because they usually occur during the same season every year. They may be a reaction to molds, grass pollens, or tree pollens. Other causes of problems are house dust mite allergens, pet dander, and mold spores. The symptoms often consist of nasal congestion, a runny itchy nose associated with sneezing, and tearing itchy eyes. There is often an associated itching of the mouth and ears. The problems happen when you come in contact with pollens and other allergens. Allergens are the particles in the air that the body reacts to with an allergic reaction. This causes you to release allergic antibodies. Through a chain of  events, these eventually cause you to release histamine into the blood stream. Although it is meant to be protective to the body, it is this release that causes your discomfort. This is why you were given anti-histamines to feel better. If you are unable to pinpoint the offending allergen, it may be determined by skin or blood testing. Allergies cannot be cured but can be controlled with medicine. Hay fever is a collection of all or some of the seasonal allergy problems. It may often be treated with simple over-the-counter medicine such as diphenhydramine. Take medicine as directed. Do not drink alcohol or drive while taking this medicine. Check with your caregiver or package insert for child dosages. If these medicines are not effective, there are many new medicines your caregiver can prescribe. Stronger medicine such as nasal spray, eye drops, and corticosteroids may be used if the first things you try do not work well. Other treatments such as immunotherapy or desensitizing injections can be used if all else fails. Follow up with your caregiver if problems continue. These seasonal allergies are usually not life threatening. They are generally more of a nuisance that can often be handled using medicine. HOME CARE INSTRUCTIONS   If unsure what causes a reaction, keep a diary of foods eaten and symptoms that follow. Avoid foods that cause reactions.  If hives or rash are present:  Take medicine as directed.  You may use an over-the-counter antihistamine (diphenhydramine) for hives and itching as needed.  Apply cold compresses (cloths) to the skin or take baths in cool water. Avoid hot baths or showers. Heat will make a rash and itching worse.  If you are severely allergic: °¨ Following a treatment for a severe reaction, hospitalization is often required for closer follow-up. °¨ Wear a medic-alert bracelet or necklace stating the allergy. °¨ You and your family must learn how to give adrenaline or use  an anaphylaxis kit. °¨ If you have had a severe reaction, always carry your anaphylaxis kit or EpiPen® with you. Use this medicine as directed by your caregiver if a severe reaction is occurring. Failure to do so could have a fatal outcome. °SEEK MEDICAL CARE IF: °· You suspect a food allergy. Symptoms generally happen within 30 minutes of eating a food. °· Your symptoms have not gone away within 2 days or are getting worse. °· You develop new symptoms. °· You want to retest yourself or your child with a food or drink you think causes an allergic reaction. Never do this if an anaphylactic reaction to that food or drink has happened before. Only do this under the care of a caregiver. °SEEK IMMEDIATE MEDICAL CARE IF:  °· You have difficulty breathing, are wheezing, or have a tight feeling in your chest or throat. °· You have a swollen mouth, or you have hives, swelling, or itching all over your body. °· You have had a severe reaction that has responded to your anaphylaxis kit or an EpiPen®. These reactions may return when the medicine has worn off. These reactions should be considered life threatening. °MAKE SURE YOU:  °· Understand these instructions. °· Will watch your condition. °· Will get help right away if you are not doing well or get worse. °Document Released: 04/08/2002 Document Revised: 05/10/2012 Document Reviewed: 09/13/2007 °ExitCare® Patient Information ©2015 [employer]. This information is not intended to replace advice given to you by your health care provider. Make sure you discuss any questions you have with your health care provider. ° ° °

## 2013-08-19 NOTE — ED Provider Notes (Signed)
CSN: [id number]     Arrival date & time 08/19/13  5462 History   First MD Initiated Contact with Patient 08/19/13 0940     Chief Complaint  Patient presents with  . Rash     (Consider location/radiation/quality/duration/timing/severity/associated sxs/prior Treatment) Patient is a 48 y.o. female presenting with allergic reaction.  Allergic Reaction Presenting symptoms: itching and rash   Presenting symptoms: no difficulty breathing, no difficulty swallowing, no swelling and no wheezing   Itching:    Location:  Leg, arm and back   Severity:  Moderate   Onset quality:  Gradual   Duration:  2 days   Timing:  Constant   Progression:  Worsening Severity:  Moderate Prior allergic episodes:  No prior episodes Context: grass and insect bite/sting (possible)   Relieved by:  Nothing Worsened by:  Nothing tried Ineffective treatments:  OTC ointments   Past Medical History  Diagnosis Date  . Asthma    Past Surgical History  Procedure Laterality Date  . Tubal ligation     No family history on file. History  Substance Use Topics  . Smoking status: Current Every Day Smoker -- 0.50 packs/day    Types: Cigarettes  . Smokeless tobacco: Never Used  . Alcohol Use: Yes   OB History   Grav Para Term Preterm Abortions TAB SAB Ect Mult Living                 Review of Systems  HENT: Negative for trouble swallowing.   Respiratory: Negative for wheezing.   Skin: Positive for itching and rash.  All other systems reviewed and are negative.     Allergies  Penicillins  Home Medications   Prior to Admission medications   Medication Sig Start Date End Date Taking? Authorizing Provider  aspirin 325 MG tablet Take 975 mg by mouth 2 (two) times daily as needed for mild pain.    Yes Historical Provider, MD  diphenhydrAMINE (BENADRYL) 25 MG tablet Take 1 tablet (25 mg total) by mouth every 6 (six) hours as needed for itching (Rash). 08/19/13   Jo-Ann Johanning L Owin Vignola, PA-C  predniSONE  (DELTASONE) 20 MG tablet Take 2 tablets (40 mg total) by mouth daily. 08/19/13   Jaxtyn Linville L Linton Stolp, PA-C   BP 149/79  Pulse 89  Temp(Src) 97.9 F (36.6 C) (Oral)  Resp 18  SpO2 96% Physical Exam  Nursing note and vitals reviewed. Constitutional: She is oriented to person, place, and time. She appears well-developed and well-nourished. No distress.  HENT:  Head: Normocephalic and atraumatic.  Right Ear: External ear normal.  Left Ear: External ear normal.  Nose: Nose normal.  Mouth/Throat: Uvula is midline, oropharynx is clear and moist and mucous membranes are normal. No uvula swelling. No posterior oropharyngeal edema.  Eyes: Conjunctivae are normal.  Neck: Normal range of motion. Neck supple.  Cardiovascular: Normal rate.   Pulmonary/Chest: Effort normal and breath sounds normal. No respiratory distress.  Abdominal: Soft.  Musculoskeletal: Normal range of motion.  Neurological: She is alert and oriented to person, place, and time.  Skin: Skin is warm and dry. Rash noted. Rash is urticarial (BUE, BLE, back). She is not diaphoretic.  Psychiatric: She has a normal mood and affect.    ED Course  Procedures (including critical care time) Medications  predniSONE (DELTASONE) tablet 60 mg (60 mg Oral Given 08/19/13 1132)  diphenhydrAMINE (BENADRYL) capsule 25 mg (25 mg Oral Given 08/19/13 1132)    Labs Review Labs Reviewed - No data to display  Imaging Review No results found.   EKG Interpretation None      MDM   Final diagnoses:  Allergic reaction, initial encounter    Filed Vitals:   08/19/13 1135  BP: 149/79  Pulse: 89  Temp:   Resp: 18   Afebrile, NAD, non-toxic appearing, AAOx4.  Patient re-evaluated prior to dc, is hemodynamically stable, in no respiratory distress, and denies the feeling of throat closing. Pt has been advised to take OTC benadryl & return to the ED if they have a mod-severe allergic rxn (s/s including throat closing, difficulty  breathing, swelling of lips face or tongue). Pt is to follow up with their PCP. Pt is agreeable with plan & verbalizes understanding.     Harlow Mares, PA-C 08/19/13 1218

## 2013-08-19 NOTE — ED Notes (Signed)
Per pt states broke out in rash early this am-B/L arms and legs-worse on right arm-swollen, raised areas

## 2013-08-19 NOTE — Progress Notes (Signed)
[facility] provided pt with a list of primary care resources and a [facility] application to help patient establish primary care.  °

## 2014-04-29 ENCOUNTER — Encounter (HOSPITAL_COMMUNITY): Payer: Self-pay | Admitting: Nurse Practitioner

## 2014-04-29 ENCOUNTER — Emergency Department (HOSPITAL_COMMUNITY)
Admission: EM | Admit: 2014-04-29 | Discharge: 2014-04-29 | Disposition: A | Discharge status: Home / Self Care | Payer: Self-pay | Attending: Emergency Medicine | Admitting: Emergency Medicine

## 2014-04-29 DIAGNOSIS — Z720 Tobacco use: Secondary | ICD-10-CM | POA: Insufficient documentation

## 2014-04-29 DIAGNOSIS — H9201 Otalgia, right ear: Secondary | ICD-10-CM | POA: Insufficient documentation

## 2014-04-29 DIAGNOSIS — J45909 Unspecified asthma, uncomplicated: Secondary | ICD-10-CM | POA: Insufficient documentation

## 2014-04-29 DIAGNOSIS — Z7982 Long term (current) use of aspirin: Secondary | ICD-10-CM | POA: Insufficient documentation

## 2014-04-29 DIAGNOSIS — Z880 Allergy status to penicillin: Secondary | ICD-10-CM | POA: Insufficient documentation

## 2014-04-29 DIAGNOSIS — R000 Tachycardia, unspecified: Secondary | ICD-10-CM | POA: Insufficient documentation

## 2014-04-29 DIAGNOSIS — J020 Streptococcal pharyngitis: Secondary | ICD-10-CM | POA: Insufficient documentation

## 2014-04-29 DIAGNOSIS — Z7952 Long term (current) use of systemic steroids: Secondary | ICD-10-CM | POA: Insufficient documentation

## 2014-04-29 LAB — RAPID STREP SCREEN (MED CTR MEBANE ONLY): Streptococcus, Group A Screen (Direct): POSITIVE — AB

## 2014-04-29 MED ORDER — HYDROCODONE-ACETAMINOPHEN 7.5-325 MG/15ML PO SOLN
10.0000 mL | ORAL | Status: AC
  Administered 2014-04-29: 10 mL via ORAL
  Filled 2014-04-29: qty 15

## 2014-04-29 MED ORDER — CEPHALEXIN 500 MG PO CAPS: 500.0000 mg | ORAL_CAPSULE | Freq: Two times a day (BID) | ORAL | Status: DC

## 2014-04-29 MED ORDER — HYDROCODONE-ACETAMINOPHEN 7.5-325 MG/15ML PO SOLN: 10.0000 mL | Freq: Four times a day (QID) | ORAL | Status: DC | PRN

## 2014-04-29 MED ORDER — IBUPROFEN 800 MG PO TABS
800.0000 mg | ORAL_TABLET | Freq: Three times a day (TID) | ORAL | Status: DC | PRN
Start: 2014-04-29 — End: 2018-07-31

## 2014-04-29 NOTE — ED Notes (Signed)
Pt reports sore throat onset yesterday, hurts to swallow, today she woke with bilateral ear pain. It feels like when she had strep

## 2014-04-29 NOTE — ED Provider Notes (Signed)
CSN: [id number]     Arrival date & time 04/29/14  1417 History  This chart is scribed for non-physician practitioner, Clayton Bibles, PA-C, working with Debby Freiberg, MD by Chester Holstein, ED Scribe.  This patient was seen in room TR04C/TR04C and the patient's care was started 3:45 PM.     Chief Complaint  Patient presents with  . Sore Throat     Patient is a 49 y.o. female presenting with pharyngitis. The history is provided by the patient. No language interpreter was used.  Sore Throat Pertinent negatives include no chest pain and no shortness of breath.   HPI Comments: Marissa [name] is a 49 y.o. female who presents to the Emergency Department complaining of sore throat with onset today. Pt notes associated right ear pain with onset two days ago. Pt notes associated cough with yellow sputum. Pt has taken BCs for relief without improvement. Pt denies fever, chills, rhinorrhea, chest pain, and SOB.   Past Medical History  Diagnosis Date  . Asthma    Past Surgical History  Procedure Laterality Date  . Tubal ligation     History reviewed. No pertinent family history. History  Substance Use Topics  . Smoking status: Current Every Day Smoker -- 0.50 packs/day    Types: Cigarettes  . Smokeless tobacco: Never Used  . Alcohol Use: Yes   OB History    No data available     Review of Systems  Constitutional: Negative for fever and chills.  HENT: Positive for ear pain and sore throat. Negative for facial swelling, rhinorrhea and trouble swallowing.   Respiratory: Positive for cough. Negative for shortness of breath.   Cardiovascular: Negative for chest pain.  Musculoskeletal: Negative for neck pain and neck stiffness.  Skin: Negative for rash.  Allergic/Immunologic: Negative for immunocompromised state.  Neurological: Negative for speech difficulty.  Psychiatric/Behavioral: Negative for self-injury.      Allergies  Penicillins  Home Medications   Prior to Admission  medications   Medication Sig Start Date End Date Taking? Authorizing Provider  aspirin 325 MG tablet Take 975 mg by mouth 2 (two) times daily as needed for mild pain.     Historical Provider, MD  diphenhydrAMINE (BENADRYL) 25 MG tablet Take 1 tablet (25 mg total) by mouth every 6 (six) hours as needed for itching (Rash). 08/19/13   Jennifer Piepenbrink, PA-C  predniSONE (DELTASONE) 20 MG tablet Take 2 tablets (40 mg total) by mouth daily. 08/19/13   Jennifer Piepenbrink, PA-C   BP 115/72 mmHg  Pulse 110  Temp(Src) 98.4 F (36.9 C) (Oral)  Resp 18  SpO2 95% Physical Exam  Constitutional: She appears well-developed and well-nourished. No distress.  HENT:  Head: Normocephalic and atraumatic.  Right Ear: External ear normal.  Left Ear: External ear normal.  Mouth/Throat: Posterior oropharyngeal edema and posterior oropharyngeal erythema present. No oropharyngeal exudate or tonsillar abscesses.  Eyes: Conjunctivae are normal.  Neck: Neck supple.  Cardiovascular: Regular rhythm.  Tachycardia present.   Pulmonary/Chest: Effort normal and breath sounds normal. No stridor. No respiratory distress. She has no wheezes. She has no rales.  Lymphadenopathy:    She has cervical adenopathy (anterior).  Neurological: She is alert.  Skin: She is not diaphoretic.  Nursing note and vitals reviewed.   ED Course  Procedures (including critical care time) DIAGNOSTIC STUDIES: Oxygen Saturation is 95% on room air, normal by my interpretation.    COORDINATION OF CARE: 3:47 PM Discussed treatment plan with patient at beside, the patient agrees with the  plan and has no further questions at this time.   Labs Review Labs Reviewed  RAPID STREP SCREEN - Abnormal; Notable for the following:    Streptococcus, Group A Screen (Direct) POSITIVE (*)    All other components within normal limits    Imaging Review No results found.   EKG Interpretation None      MDM   Final diagnoses:  Strep pharyngitis    Afebrile, nontoxic patient with sore throat, ear pain bilaterally.  Strep test positive.  No airway concerns.  No e/o peritonsillar abscess.   D/C home with lortab, ibuprofen, amoxicillin.  Discussed result, findings, treatment, and follow up  with patient.  Pt given return precautions.  Pt verbalizes understanding and agrees with plan.        I personally performed the services described in this documentation, which was scribed in my presence. The recorded information has been reviewed and is accurate.    Clayton Bibles, PA-C 04/29/14 1626  Debby Freiberg, MD 05/02/14 [phone]

## 2014-04-29 NOTE — Discharge Instructions (Signed)
Read the information below.  Use the prescribed medication as directed.  Please discuss all new medications with your pharmacist.  Do not take additional tylenol while taking the prescribed pain medication to avoid overdose.  You may return to the Emergency Department at any time for worsening condition or any new symptoms that concern you.  If you develop high fevers, difficulty swallowing or breathing, or you are unable to tolerate fluids by mouth, return to the ER immediately for a recheck.      Strep Throat Strep throat is an infection of the throat. It is caused by a germ. Strep throat spreads from person to person by coughing, sneezing, or close contact. HOME CARE  Rinse your mouth (gargle) with warm salt water (1 teaspoon salt in 1 cup of water). Do this 3 to 4 times per day or as needed for comfort.  Family members with a sore throat or fever should see a doctor.  Make sure everyone in your house washes their hands well.  Do not share food, drinking cups, or personal items.  Eat soft foods until your sore throat gets better.  Drink enough water and fluids to keep your pee (urine) clear or pale yellow.  Rest.  Stay home from school, daycare, or work until you have taken medicine for 24 hours.  Only take medicine as told by your doctor.  Take your medicine as told. Finish it even if you start to feel better. GET HELP RIGHT AWAY IF:   You have new problems, such as throwing up (vomiting) or bad headaches.  You have a stiff or painful neck, chest pain, trouble breathing, or trouble swallowing.  You have very bad throat pain, drooling, or changes in your voice.  Your neck puffs up (swells) or gets red and tender.  You have a fever.  You are very tired, your mouth is dry, or you are peeing less than normal.  You cannot wake up completely.  You get a rash, cough, or earache.  You have green, yellow-brown, or bloody spit.  Your pain does not get better with  medicine. MAKE SURE YOU:   Understand these instructions.  Will watch your condition.  Will get help right away if you are not doing well or get worse. Document Released: 07/02/2007 Document Revised: 04/07/2011 Document Reviewed: 03/14/2010 [facility] Patient Information 2015 [place], [place]. This information is not intended to replace advice given to you by your health care provider. Make sure you discuss any questions you have with your health care provider.

## 2018-07-29 ENCOUNTER — Emergency Department (HOSPITAL_COMMUNITY): Payer: Medicaid Other

## 2018-07-29 ENCOUNTER — Encounter (HOSPITAL_COMMUNITY): Payer: Self-pay | Admitting: Internal Medicine

## 2018-07-29 ENCOUNTER — Observation Stay (HOSPITAL_COMMUNITY): Payer: Medicaid Other

## 2018-07-29 ENCOUNTER — Inpatient Hospital Stay (HOSPITAL_COMMUNITY)
Admission: EM | Admit: 2018-07-29 | Discharge: 2018-07-31 | DRG: 189 | Disposition: A | Discharge status: Home / Self Care | Payer: Medicaid Other | Attending: Internal Medicine | Admitting: Internal Medicine

## 2018-07-29 ENCOUNTER — Other Ambulatory Visit: Payer: Self-pay

## 2018-07-29 DIAGNOSIS — Z791 Long term (current) use of non-steroidal anti-inflammatories (NSAID): Secondary | ICD-10-CM

## 2018-07-29 DIAGNOSIS — R51 Headache: Secondary | ICD-10-CM | POA: Diagnosis present

## 2018-07-29 DIAGNOSIS — Z79899 Other long term (current) drug therapy: Secondary | ICD-10-CM

## 2018-07-29 DIAGNOSIS — R519 Headache, unspecified: Secondary | ICD-10-CM | POA: Diagnosis present

## 2018-07-29 DIAGNOSIS — J45901 Unspecified asthma with (acute) exacerbation: Secondary | ICD-10-CM | POA: Diagnosis present

## 2018-07-29 DIAGNOSIS — J441 Chronic obstructive pulmonary disease with (acute) exacerbation: Secondary | ICD-10-CM

## 2018-07-29 DIAGNOSIS — E669 Obesity, unspecified: Secondary | ICD-10-CM | POA: Diagnosis present

## 2018-07-29 DIAGNOSIS — R7989 Other specified abnormal findings of blood chemistry: Secondary | ICD-10-CM

## 2018-07-29 DIAGNOSIS — Z23 Encounter for immunization: Secondary | ICD-10-CM

## 2018-07-29 DIAGNOSIS — Z720 Tobacco use: Secondary | ICD-10-CM

## 2018-07-29 DIAGNOSIS — R0902 Hypoxemia: Secondary | ICD-10-CM

## 2018-07-29 DIAGNOSIS — Z7952 Long term (current) use of systemic steroids: Secondary | ICD-10-CM

## 2018-07-29 DIAGNOSIS — Z79891 Long term (current) use of opiate analgesic: Secondary | ICD-10-CM

## 2018-07-29 DIAGNOSIS — Z6834 Body mass index (BMI) 34.0-34.9, adult: Secondary | ICD-10-CM

## 2018-07-29 DIAGNOSIS — Z818 Family history of other mental and behavioral disorders: Secondary | ICD-10-CM

## 2018-07-29 DIAGNOSIS — Z1159 Encounter for screening for other viral diseases: Secondary | ICD-10-CM

## 2018-07-29 DIAGNOSIS — J449 Chronic obstructive pulmonary disease, unspecified: Secondary | ICD-10-CM

## 2018-07-29 DIAGNOSIS — D509 Iron deficiency anemia, unspecified: Secondary | ICD-10-CM

## 2018-07-29 DIAGNOSIS — J9691 Respiratory failure, unspecified with hypoxia: Secondary | ICD-10-CM | POA: Diagnosis present

## 2018-07-29 DIAGNOSIS — F17210 Nicotine dependence, cigarettes, uncomplicated: Secondary | ICD-10-CM | POA: Diagnosis present

## 2018-07-29 DIAGNOSIS — Z8249 Family history of ischemic heart disease and other diseases of the circulatory system: Secondary | ICD-10-CM

## 2018-07-29 DIAGNOSIS — J9601 Acute respiratory failure with hypoxia: Principal | ICD-10-CM | POA: Diagnosis present

## 2018-07-29 HISTORY — DX: Depression, unspecified: F32.A

## 2018-07-29 HISTORY — DX: Cardiac murmur, unspecified: R01.1

## 2018-07-29 LAB — CBC WITH DIFFERENTIAL/PLATELET
Abs Immature Granulocytes: 0.02 10*3/uL (ref 0.00–0.07)
Basophils Absolute: 0.1 10*3/uL (ref 0.0–0.1)
Basophils Relative: 2 %
Eosinophils Absolute: 0.2 10*3/uL (ref 0.0–0.5)
Eosinophils Relative: 3 %
HCT: 36.1 % (ref 36.0–46.0)
Hemoglobin: 9.4 g/dL — ABNORMAL LOW (ref 12.0–15.0)
Immature Granulocytes: 0 %
Lymphocytes Relative: 22 %
Lymphs Abs: 1.4 10*3/uL (ref 0.7–4.0)
MCH: 20.1 pg — ABNORMAL LOW (ref 26.0–34.0)
MCHC: 26 g/dL — ABNORMAL LOW (ref 30.0–36.0)
MCV: 77.1 fL — ABNORMAL LOW (ref 80.0–100.0)
Monocytes Absolute: 0.7 10*3/uL (ref 0.1–1.0)
Monocytes Relative: 10 %
Neutro Abs: 4.1 10*3/uL (ref 1.7–7.7)
Neutrophils Relative %: 63 %
Platelets: 476 10*3/uL — ABNORMAL HIGH (ref 150–400)
RBC: 4.68 MIL/uL (ref 3.87–5.11)
RDW: 20.5 % — ABNORMAL HIGH (ref 11.5–15.5)
WBC: 6.4 10*3/uL (ref 4.0–10.5)
nRBC: 0 % (ref 0.0–0.2)

## 2018-07-29 LAB — SARS CORONAVIRUS 2 BY RT PCR (HOSPITAL ORDER, PERFORMED IN ~~LOC~~ HOSPITAL LAB): SARS Coronavirus 2: NEGATIVE

## 2018-07-29 LAB — RESPIRATORY PANEL BY PCR

## 2018-07-29 LAB — COMPREHENSIVE METABOLIC PANEL
ALT: 34 U/L (ref 0–44)
AST: 36 U/L (ref 15–41)
Albumin: 3.3 g/dL — ABNORMAL LOW (ref 3.5–5.0)
Alkaline Phosphatase: 94 U/L (ref 38–126)
Anion gap: 11 (ref 5–15)
BUN: 6 mg/dL (ref 6–20)
CO2: 25 mmol/L (ref 22–32)
Calcium: 8.8 mg/dL — ABNORMAL LOW (ref 8.9–10.3)
Chloride: 105 mmol/L (ref 98–111)
Creatinine, Ser: 0.45 mg/dL (ref 0.44–1.00)
GFR calc Af Amer: >60 mL/min (ref >60)
GFR calc non Af Amer: >60 mL/min (ref >60)
Glucose, Bld: 81 mg/dL (ref 70–99)
Potassium: 3.8 mmol/L (ref 3.5–5.1)
Sodium: 141 mmol/L (ref 135–145)
Total Bilirubin: 0.3 mg/dL (ref 0.3–1.2)
Total Protein: 7.1 g/dL (ref 6.5–8.1)

## 2018-07-29 LAB — STREP PNEUMONIAE URINARY ANTIGEN: Strep Pneumo Urinary Antigen: NEGATIVE

## 2018-07-29 LAB — TROPONIN I (HIGH SENSITIVITY)
Troponin I (High Sensitivity): 6 ng/L (ref <18)
Troponin I (High Sensitivity): 7 ng/L (ref <18)

## 2018-07-29 LAB — TRIGLYCERIDES: Triglycerides: 75 mg/dL (ref <150)

## 2018-07-29 LAB — PROCALCITONIN: Procalcitonin: <0.1 ng/mL

## 2018-07-29 LAB — FIBRINOGEN: Fibrinogen: 677 mg/dL — ABNORMAL HIGH (ref 210–475)

## 2018-07-29 LAB — LACTIC ACID, PLASMA
Lactic Acid, Venous: 0.6 mmol/L (ref 0.5–1.9)
Lactic Acid, Venous: 0.7 mmol/L (ref 0.5–1.9)

## 2018-07-29 LAB — LACTATE DEHYDROGENASE: LDH: 246 U/L — ABNORMAL HIGH (ref 98–192)

## 2018-07-29 LAB — BRAIN NATRIURETIC PEPTIDE: B Natriuretic Peptide: 44.4 pg/mL (ref 0.0–100.0)

## 2018-07-29 LAB — FERRITIN: Ferritin: 6 ng/mL — ABNORMAL LOW (ref 11–307)

## 2018-07-29 LAB — D-DIMER, QUANTITATIVE: D-Dimer, Quant: 0.75 ug/mL-FEU — ABNORMAL HIGH (ref 0.00–0.50)

## 2018-07-29 LAB — GROUP A STREP BY PCR: Group A Strep by PCR: NOT DETECTED

## 2018-07-29 LAB — I-STAT BETA HCG BLOOD, ED (MC, WL, AP ONLY): I-stat hCG, quantitative: <5 m[IU]/mL (ref <5)

## 2018-07-29 LAB — C-REACTIVE PROTEIN: CRP: 2.6 mg/dL — ABNORMAL HIGH (ref <1.0)

## 2018-07-29 MED ORDER — IOHEXOL 350 MG/ML SOLN
100.0000 mL | Freq: Once | INTRAVENOUS | Status: AC | PRN
  Administered 2018-07-29: 100 mL via INTRAVENOUS

## 2018-07-29 MED ORDER — ACETAMINOPHEN 650 MG RE SUPP: 650.0000 mg | Freq: Four times a day (QID) | RECTAL | Status: DC | PRN

## 2018-07-29 MED ORDER — METHYLPREDNISOLONE SODIUM SUCC 125 MG IJ SOLR
125.0000 mg | Freq: Once | INTRAMUSCULAR | Status: AC
  Administered 2018-07-29: 125 mg via INTRAVENOUS
  Filled 2018-07-29: qty 2

## 2018-07-29 MED ORDER — BUTALBITAL-APAP-CAFFEINE 50-325-40 MG PO TABS
1.0000 | ORAL_TABLET | Freq: Four times a day (QID) | ORAL | Status: DC | PRN
  Administered 2018-07-30 – 2018-07-31 (×3): 1 via ORAL
  Filled 2018-07-29 (×3): qty 1

## 2018-07-29 MED ORDER — ACETAMINOPHEN 325 MG PO TABS
650.0000 mg | ORAL_TABLET | Freq: Four times a day (QID) | ORAL | Status: DC | PRN
  Administered 2018-07-30 (×2): 650 mg via ORAL
  Filled 2018-07-29 (×2): qty 2

## 2018-07-29 MED ORDER — BUTALBITAL-APAP-CAFFEINE 50-325-40 MG PO TABS
1.0000 | ORAL_TABLET | ORAL | Status: AC
  Administered 2018-07-29: 1 via ORAL
  Filled 2018-07-29: qty 1

## 2018-07-29 MED ORDER — ONDANSETRON HCL 4 MG PO TABS: 4.0000 mg | ORAL_TABLET | Freq: Four times a day (QID) | ORAL | Status: DC | PRN

## 2018-07-29 MED ORDER — SODIUM CHLORIDE 0.9 % IV SOLN
1.0000 g | INTRAVENOUS | Status: DC
  Administered 2018-07-29: 1 g via INTRAVENOUS
  Filled 2018-07-29: qty 10

## 2018-07-29 MED ORDER — NICOTINE 21 MG/24HR TD PT24
21.0000 mg | MEDICATED_PATCH | Freq: Every day | TRANSDERMAL | Status: DC
  Administered 2018-07-29 – 2018-07-31 (×3): 21 mg via TRANSDERMAL
  Filled 2018-07-29 (×3): qty 1

## 2018-07-29 MED ORDER — IPRATROPIUM BROMIDE HFA 17 MCG/ACT IN AERS
2.0000 | INHALATION_SPRAY | Freq: Three times a day (TID) | RESPIRATORY_TRACT | Status: DC
  Administered 2018-07-30: 2 via RESPIRATORY_TRACT
  Filled 2018-07-29: qty 12.9

## 2018-07-29 MED ORDER — KETOROLAC TROMETHAMINE 30 MG/ML IJ SOLN
30.0000 mg | Freq: Once | INTRAMUSCULAR | Status: AC
  Administered 2018-07-29: 30 mg via INTRAVENOUS
  Filled 2018-07-29: qty 1

## 2018-07-29 MED ORDER — ALBUTEROL SULFATE (2.5 MG/3ML) 0.083% IN NEBU: 2.5000 mg | INHALATION_SOLUTION | RESPIRATORY_TRACT | Status: DC

## 2018-07-29 MED ORDER — PROCHLORPERAZINE EDISYLATE 10 MG/2ML IJ SOLN
10.0000 mg | Freq: Once | INTRAMUSCULAR | Status: AC
  Administered 2018-07-29: 10 mg via INTRAVENOUS
  Filled 2018-07-29: qty 2

## 2018-07-29 MED ORDER — ALBUTEROL SULFATE (2.5 MG/3ML) 0.083% IN NEBU
2.5000 mg | INHALATION_SOLUTION | Freq: Three times a day (TID) | RESPIRATORY_TRACT | Status: DC
  Administered 2018-07-30: 2.5 mg via RESPIRATORY_TRACT
  Filled 2018-07-29: qty 3

## 2018-07-29 MED ORDER — PANTOPRAZOLE SODIUM 40 MG PO TBEC
40.0000 mg | DELAYED_RELEASE_TABLET | Freq: Every day | ORAL | Status: DC
  Administered 2018-07-29 – 2018-07-31 (×3): 40 mg via ORAL
  Filled 2018-07-29 (×3): qty 1

## 2018-07-29 MED ORDER — SODIUM CHLORIDE 0.9% FLUSH
3.0000 mL | Freq: Two times a day (BID) | INTRAVENOUS | Status: DC
  Administered 2018-07-30 – 2018-07-31 (×3): 3 mL via INTRAVENOUS

## 2018-07-29 MED ORDER — ALBUTEROL SULFATE HFA 108 (90 BASE) MCG/ACT IN AERS
2.0000 | INHALATION_SPRAY | RESPIRATORY_TRACT | Status: DC
  Administered 2018-07-29: 2 via RESPIRATORY_TRACT
  Filled 2018-07-29: qty 6.7

## 2018-07-29 MED ORDER — PREDNISONE 20 MG PO TABS
40.0000 mg | ORAL_TABLET | Freq: Every day | ORAL | Status: DC
  Administered 2018-07-30 – 2018-07-31 (×2): 40 mg via ORAL
  Filled 2018-07-29 (×2): qty 2

## 2018-07-29 MED ORDER — GUAIFENESIN ER 600 MG PO TB12
600.0000 mg | ORAL_TABLET | Freq: Two times a day (BID) | ORAL | Status: DC
  Administered 2018-07-29 – 2018-07-31 (×5): 600 mg via ORAL
  Filled 2018-07-29 (×5): qty 1

## 2018-07-29 MED ORDER — ALBUTEROL SULFATE HFA 108 (90 BASE) MCG/ACT IN AERS
4.0000 | INHALATION_SPRAY | Freq: Once | RESPIRATORY_TRACT | Status: AC
  Administered 2018-07-29: 4 via RESPIRATORY_TRACT
  Filled 2018-07-29: qty 6.7

## 2018-07-29 MED ORDER — IPRATROPIUM BROMIDE HFA 17 MCG/ACT IN AERS
2.0000 | INHALATION_SPRAY | RESPIRATORY_TRACT | Status: DC
  Administered 2018-07-29 (×3): 2 via RESPIRATORY_TRACT
  Filled 2018-07-29: qty 12.9

## 2018-07-29 MED ORDER — DIPHENHYDRAMINE HCL 50 MG/ML IJ SOLN
12.5000 mg | Freq: Once | INTRAMUSCULAR | Status: AC
  Administered 2018-07-29: 12.5 mg via INTRAVENOUS
  Filled 2018-07-29: qty 1

## 2018-07-29 MED ORDER — ENOXAPARIN SODIUM 40 MG/0.4ML ~~LOC~~ SOLN
40.0000 mg | SUBCUTANEOUS | Status: DC
  Administered 2018-07-30: 40 mg via SUBCUTANEOUS
  Filled 2018-07-29: qty 0.4

## 2018-07-29 MED ORDER — LORATADINE 10 MG PO TABS
10.0000 mg | ORAL_TABLET | Freq: Every day | ORAL | Status: DC
  Administered 2018-07-29 – 2018-07-31 (×3): 10 mg via ORAL
  Filled 2018-07-29 (×3): qty 1

## 2018-07-29 MED ORDER — ALBUTEROL SULFATE (2.5 MG/3ML) 0.083% IN NEBU
2.5000 mg | INHALATION_SOLUTION | RESPIRATORY_TRACT | Status: DC
  Administered 2018-07-29: 2.5 mg via RESPIRATORY_TRACT
  Filled 2018-07-29: qty 3

## 2018-07-29 MED ORDER — ONDANSETRON HCL 4 MG/2ML IJ SOLN: 4.0000 mg | Freq: Four times a day (QID) | INTRAMUSCULAR | Status: DC | PRN

## 2018-07-29 MED ORDER — PNEUMOCOCCAL VAC POLYVALENT 25 MCG/0.5ML IJ INJ
0.5000 mL | INJECTION | INTRAMUSCULAR | Status: AC
  Administered 2018-07-30: 0.5 mL via INTRAMUSCULAR
  Filled 2018-07-29: qty 0.5

## 2018-07-29 NOTE — ED Notes (Signed)
Pt desat to 82-86%. RN placed Pt on 2L/O2 Pt sat to 88% RN placed Pt on 5L/O2. Pt currently 96%

## 2018-07-29 NOTE — ED Triage Notes (Addendum)
Pt POV d/t CP, SOB,  & Frontal Headache that started x1 wk ago. Pt states she has a lesion to L. Side of tongue   Hx Asthma

## 2018-07-29 NOTE — H&P (Addendum)
History and Physical    Marissa [name] GYI:[id number] DOB: 12/05/1965 DOA: 07/29/2018  Referring MD/NP/PA: Charlesetta Shanks, MD PCP: Patient, No Pcp Per  Patient coming from: Home  Chief Complaint: Shortness of breath  I have personally briefly reviewed patient's old medical records in [place]   HPI: Marissa [name] is a 53 y.o. female with medical history significant of asthma and tobacco abuse; who presents with complaints of progressively worsening shortness of breath over the last week.  She reports being unable to sleep at night due to feeling as though she cannot breathe.  Reports having pleuritic-like chest pain with taking a deep breath in on her left side and in her back.  Complains of a productive cough with intermittent greenish to clear sputum production.  Coughing seems to worsen pain symptoms.  She has been using over-the-counter medications of Bronkaid as she ran out of her inhaler with some help of breaking up the congestion.  Associated symptoms include complaints of throbbing headache, low back pain, sore throat, ear discomfort especially on the left, posttussive emesis x2, allergies, and generalized malaise.  For the headache she has been taking BC Goody powders.  Where she is currently living has a hole in the roof where it leaks inside the home every time that it rains, and wash machine overflows where there is standing water.  She notes that there is black which she thinks is mold in the home and may be worsening her breathing.  Denies having any fever, abdominal pain, dysuria, or diarrhea symptoms.  She admits to smoking at least 1 and half packs of cigarettes per day on average since the age of 65.   ED Course: On admission into the emergency department patient was seen to be afebrile, respirations 18-31, and O2 saturations as low as 84% on room air improving to >92% with 5 liters of nasal cannula oxygen.  Labs revealed WBC 6.4, hemoglobin 9.4 with low MCV and MCH, platelets  476, d-dimer 0.75, and CRP 2.6.  Chest x-ray negative for any acute abnormalities.  Patient was given 125 mg of Solu-Medrol IV, albuterol, and Atrovent.  TRH called to admit.  Review of Systems  Constitutional: Positive for malaise/fatigue. Negative for fever.  HENT: Positive for congestion, ear pain and sore throat.   Eyes: Negative for photophobia and pain.  Respiratory: Positive for cough, sputum production, shortness of breath and wheezing.   Cardiovascular: Positive for chest pain. Negative for leg swelling.  Gastrointestinal: Positive for vomiting. Negative for nausea.  Genitourinary: Negative for dysuria and frequency.  Musculoskeletal: Positive for back pain and myalgias.  Skin: Negative for rash.  Neurological: Negative for focal weakness and loss of consciousness.  Endo/Heme/Allergies: Positive for environmental allergies. Negative for polydipsia.  Psychiatric/Behavioral: Negative for memory loss. The patient has insomnia.     Past Medical History:  Diagnosis Date  . Asthma     Past Surgical History:  Procedure Laterality Date  . TUBAL LIGATION       reports that she has been smoking cigarettes. She has been smoking about 0.50 packs per day. She has never used smokeless tobacco. She reports current alcohol use. She reports that she does not use drugs.  Allergies  Allergen Reactions  . Penicillins Nausea Only    Did it involve swelling of the face/tongue/throat, SOB, or low BP? No Did it involve sudden or severe rash/hives, skin peeling, or any reaction on the inside of your mouth or nose? No Did you need to seek medical  attention at a hospital or doctor's office? No When did it last happen?20 years If all above answers are "NO", may proceed with cephalosporin use.    Family History  Problem Relation Age of Onset  . Heart disease Mother   . Dementia Father     Prior to Admission medications   Medication Sig Start Date End Date Taking? Authorizing Provider   ePHEDrine-guaiFENesin (BRONKAID) 25-400 MG TABS Take 1 tablet by mouth as needed (shortness of breath).    Yes [provider]  cephALEXin (KEFLEX) 500 MG capsule Take 1 capsule (500 mg total) by mouth 2 (two) times daily. Patient not taking: Reported on 07/29/2018 04/29/14   Clayton Bibles, PA-C  diphenhydrAMINE (BENADRYL) 25 MG tablet Take 1 tablet (25 mg total) by mouth every 6 (six) hours as needed for itching (Rash). Patient not taking: Reported on 07/29/2018 08/19/13   Piepenbrink, Anderson Malta, PA-C  HYDROcodone-acetaminophen (HYCET) 7.5-325 mg/15 ml solution Take 10 mLs by mouth 4 (four) times daily as needed for moderate pain or severe pain. Patient not taking: Reported on 07/29/2018 04/29/14   Clayton Bibles, PA-C  ibuprofen (ADVIL,MOTRIN) 800 MG tablet Take 1 tablet (800 mg total) by mouth every 8 (eight) hours as needed for mild pain or moderate pain. Patient not taking: Reported on 07/29/2018 04/29/14   Clayton Bibles, PA-C  predniSONE (DELTASONE) 20 MG tablet Take 2 tablets (40 mg total) by mouth daily. Patient not taking: Reported on 07/29/2018 08/19/13   Baron Sane, PA-C    Physical Exam:  Constitutional: Middle-aged female who appears to be in no acute respiratory distress Vitals:   07/29/18 1145 07/29/18 1151 07/29/18 1200 07/29/18 1315  BP: 118/61  122/60 110/63  Pulse: 81 92 87 91  Resp: 18 20 (!) 21 (!) 24  Temp:      TempSrc:      SpO2: 99%  (!) 89% 96%  Weight:      Height:       Eyes: PERRL, lids and conjunctivae normal ENMT: Mucous membranes are moist.  Erythema the posterior oropharynx with ulceration noted of the left side of the tongue likely secondary to broken tooth on the same side.  Nasal congestion noted. Neck: normal, supple, no masses, no thyromegaly.  No JVD appreciated. Respiratory: Decreased overall aeration with positive expiratory wheeze appreciated.  Patient currently on 5 L nasal cannula oxygen.  Able to talk in almost complete sentences. Cardiovascular:  Regular rate and rhythm, no murmurs / rubs / gallops. No extremity edema. 2+ pedal pulses. No carotid bruits.  Abdomen: no tenderness, no masses palpated. No hepatosplenomegaly. Bowel sounds positive.  Musculoskeletal: no clubbing / cyanosis. No joint deformity upper and lower extremities. Good ROM, no contractures. Normal muscle tone.  Skin: no rashes, lesions, ulcers. No induration Neurologic: CN 2-12 grossly intact. Sensation intact, DTR normal. Strength 5/5 in all 4.  Psychiatric: Normal judgment and insight. Alert and oriented x 3. Normal mood.     Labs on Admission: I have personally reviewed following labs and imaging studies  CBC: Recent Labs  Lab 07/29/18 1139  WBC 6.4  NEUTROABS 4.1  HGB 9.4*  HCT 36.1  MCV 77.1*  PLT 601*   Basic Metabolic Panel: Recent Labs  Lab 07/29/18 1139  NA 141  K 3.8  CL 105  CO2 25  GLUCOSE 81  BUN 6  CREATININE 0.45  CALCIUM 8.8*   GFR: Estimated Creatinine Clearance: 81.9 mL/min (by C-G formula based on SCr of 0.45 mg/dL). Liver Function Tests: Recent Labs  Lab 07/29/18 1139  AST 36  ALT 34  ALKPHOS 94  BILITOT 0.3  PROT 7.1  ALBUMIN 3.3*   No results for input(s): LIPASE, AMYLASE in the last 168 hours. No results for input(s): AMMONIA in the last 168 hours. Coagulation Profile: No results for input(s): INR, PROTIME in the last 168 hours. Cardiac Enzymes: No results for input(s): CKTOTAL, CKMB, CKMBINDEX, TROPONINI in the last 168 hours. BNP (last 3 results) No results for input(s): PROBNP in the last 8760 hours. HbA1C: No results for input(s): HGBA1C in the last 72 hours. CBG: No results for input(s): GLUCAP in the last 168 hours. Lipid Profile: Recent Labs    07/29/18 1139  TRIG 75   Thyroid Function Tests: No results for input(s): TSH, T4TOTAL, FREET4, T3FREE, THYROIDAB in the last 72 hours. Anemia Panel: Recent Labs    07/29/18 1139  FERRITIN 6*   Urine analysis:    Component Value Date/Time    COLORURINE YELLOW 03/16/2009 1747   APPEARANCEUR CLOUDY (A) 03/16/2009 1747   LABSPEC 1.025 03/16/2009 1747   PHURINE 8.5 (H) 03/16/2009 1747   GLUCOSEU NEGATIVE 03/16/2009 1747   HGBUR NEGATIVE 03/16/2009 1747   BILIRUBINUR NEGATIVE 03/16/2009 1747   KETONESUR NEGATIVE 03/16/2009 1747   PROTEINUR 30 (A) 03/16/2009 1747   UROBILINOGEN 1.0 03/16/2009 1747   NITRITE NEGATIVE 03/16/2009 1747   LEUKOCYTESUR NEGATIVE 03/16/2009 1747   Sepsis Labs: Recent Results (from the past 240 hour(s))  SARS Coronavirus 2 [facility] order, Performed in [place] hospital lab)     Status: None   Collection Time: 07/29/18 11:52 AM   Specimen: Nasopharyngeal Swab  Result Value Ref Range Status   SARS Coronavirus 2 NEGATIVE NEGATIVE Final    Comment: (NOTE) If result is NEGATIVE SARS-CoV-2 target nucleic acids are NOT DETECTED. The SARS-CoV-2 RNA is generally detectable in upper and lower  respiratory specimens during the acute phase of infection. The lowest  concentration of SARS-CoV-2 viral copies this assay can detect is 250  copies / mL. A negative result does not preclude SARS-CoV-2 infection  and should not be used as the sole basis for treatment or other  patient management decisions.  A negative result may occur with  improper specimen collection / handling, submission of specimen other  than nasopharyngeal swab, presence of viral mutation(s) within the  areas targeted by this assay, and inadequate number of viral copies  (<250 copies / mL). A negative result must be combined with clinical  observations, patient history, and epidemiological information. If result is POSITIVE SARS-CoV-2 target nucleic acids are DETECTED. The SARS-CoV-2 RNA is generally detectable in upper and lower  respiratory specimens dur ing the acute phase of infection.  Positive  results are indicative of active infection with SARS-CoV-2.  Clinical  correlation with patient history and other diagnostic information is   necessary to determine patient infection status.  Positive results do  not rule out bacterial infection or co-infection with other viruses. If result is PRESUMPTIVE POSTIVE SARS-CoV-2 nucleic acids MAY BE PRESENT.   A presumptive positive result was obtained on the submitted specimen  and confirmed on repeat testing.  While 2019 novel coronavirus  (SARS-CoV-2) nucleic acids may be present in the submitted sample  additional confirmatory testing may be necessary for epidemiological  and / or clinical management purposes  to differentiate between  SARS-CoV-2 and other Sarbecovirus currently known to infect humans.  If clinically indicated additional testing with an alternate test  methodology [phone]) is advised. The SARS-CoV-2 RNA is  generally  detectable in upper and lower respiratory sp ecimens during the acute  phase of infection. The expected result is Negative. Fact Sheet for Patients:  StrictlyIdeas.no Fact Sheet for Healthcare Providers: BankingDealers.co.za This test is not yet approved or cleared by the [place] FDA and has been authorized for detection and/or diagnosis of SARS-CoV-2 by FDA under an Emergency Use Authorization (EUA).  This EUA will remain in effect (meaning this test can be used) for the duration of the COVID-19 declaration under Section 564(b)(1) of the Act, 21 U.S.C. section 360bbb-3(b)(1), unless the authorization is terminated or revoked sooner. Performed at [facility], [place] [address], [place], [place] [address]      Radiological Exams on Admission: Dg Chest Port 1 View  Result Date: 07/29/2018 CLINICAL DATA:  Shortness of breath, cough EXAM: PORTABLE CHEST 1 VIEW COMPARISON:  03/06/2013 FINDINGS: There is no focal consolidation. There is no pleural effusion or pneumothorax. The cardiac silhouette is relatively enlarged which may be projectional given the AP view. There is no acute osseous  abnormality. IMPRESSION: No active disease. Electronically Signed   By: Kathreen Devoid   On: 07/29/2018 12:12    EKG: Independently reviewed.  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  Patient presents with complaints of what sounds like a viral upper respiratory infection recently now having worsening shortness of breath.  Chest x-ray negative for any acute abnormalities.  Found hypoxic on room air to the 80s with improvement on 5 L of nasal cannula oxygen.  Suspect symptoms likely related with a asthma/COPD exacerbation.  Reports of black mold could also be contributing.  Given elevated CRP there is also been possibility of infection. -Admit to a medical telemetry bed -COPD order set initiated  -Continuous pulse oximetry with nasal cannula oxygen -Check respiratory virus panel -Follow-up sputum and blood cultures -Scheduled albuterol and Atrovent  -Empiric antibiotics of Rocephin -Mucinex   Elevated d-dimer: In the setting of patient's acute respiratory failure with new oxygen requirement and elevated d-dimer question possibility of underlying pulmonary embolus given her complaints of pleuritic chest pain. -Check CT angiogram of the chest  Chest pain:Troponin negative and EKG without significant signs of ischemia noted. -Follow-up repeat troponin and CT angiogram of the chest  Headache: Patient reports complaints of continued headaches.  Suspect could be related to patient's sinus congestion. 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  Patient with low MCV and MCH.  Denies any reports of bleeding or dark stools. -Check iron studies and repeat CBC in a.m  Tobacco abuse: Patient reports smoking at least 1/2 pack cigarettes per day for  approximately 39 years. -Nicotine patch offered  GI prophylaxis: Protonix DVT prophylaxis: Lovenox  Code Status: Full Family Communication: No family present at bedside. Disposition Plan: Possible discharge home in 1 to 2 days Consults called: None Admission status: Observation  Norval Morton MD [facility] Pager [phone]   If 7PM-7AM, please contact night-coverage [email or web address] Password [facility]  07/29/2018, 2:46 PM

## 2018-07-29 NOTE — ED Notes (Signed)
ED TO INPATIENT HANDOFF REPORT  ED Nurse Name and Phone #: [phone]  S Name/Age/Gender Marissa [name] 53 y.o. female Room/Bed: 029C/029C  Code Status   Code Status: Not on file  Home/SNF/Other Home Patient oriented to: self, place and situation Is this baseline? Yes   Triage Complete: Triage complete  Chief Complaint asthma/ha  Triage Note Pt POV d/t CP, SOB,  & Frontal Headache that started x1 wk ago. Pt states she has a lesion to L. Side of tongue   Hx Asthma    Allergies Allergies  Allergen Reactions  . Penicillins Nausea Only    Did it involve swelling of the face/tongue/throat, SOB, or low BP? No Did it involve sudden or severe rash/hives, skin peeling, or any reaction on the inside of your mouth or nose? No Did you need to seek medical attention at a hospital or doctor's office? No When did it last happen?20 years If all above answers are "NO", may proceed with cephalosporin use.    Level of Care/Admitting Diagnosis ED Disposition    ED Disposition Condition [facility] Area: [place] [[id number]]  Level of Care: Telemetry Medical [104]  I expect the patient will be discharged within 24 hours: No (not a candidate for 5C-Observation unit)  Covid Evaluation: Confirmed COVID Negative  Diagnosis: Respiratory failure with hypoxia [facility]) [[id number]]  Admitting Physician: Norval Morton [[id number]]  Attending Physician: Norval Morton [[id number]]  PT Class (Do Not Modify): Observation [104]  PT Acc Code (Do Not Modify): Observation [10022]       B Medical/Surgery History Past Medical History:  Diagnosis Date  . Asthma    Past Surgical History:  Procedure Laterality Date  . TUBAL LIGATION       A IV Location/Drains/Wounds Patient Lines/Drains/Airways Status   Active Line/Drains/Airways    None          Intake/Output Last 24 hours No intake or output data in the 24 hours ending 07/29/18 1550  Labs/Imaging Results  for orders placed or performed during the hospital encounter of 07/29/18 (from the past 48 hour(s))  Lactic acid, plasma     Status: None   Collection Time: 07/29/18 11:39 AM  Result Value Ref Range   Lactic Acid, Venous 0.6 0.5 - 1.9 mmol/L    Comment: Performed at [facility], [address] [place], [place] [address]  CBC WITH DIFFERENTIAL     Status: Abnormal   Collection Time: 07/29/18 11:39 AM  Result Value Ref Range   WBC 6.4 4.0 - 10.5 K/uL   RBC 4.68 3.87 - 5.11 MIL/uL   Hemoglobin 9.4 (L) 12.0 - 15.0 g/dL   HCT 36.1 36.0 - 46.0 %   MCV 77.1 (L) 80.0 - 100.0 fL   MCH 20.1 (L) 26.0 - 34.0 pg   MCHC 26.0 (L) 30.0 - 36.0 g/dL   RDW 20.5 (H) 11.5 - 15.5 %   Platelets 476 (H) 150 - 400 K/uL   nRBC 0.0 0.0 - 0.2 %   Neutrophils Relative % 63 %   Neutro Abs 4.1 1.7 - 7.7 K/uL   Lymphocytes Relative 22 %   Lymphs Abs 1.4 0.7 - 4.0 K/uL   Monocytes Relative 10 %   Monocytes Absolute 0.7 0.1 - 1.0 K/uL   Eosinophils Relative 3 %   Eosinophils Absolute 0.2 0.0 - 0.5 K/uL   Basophils Relative 2 %   Basophils Absolute 0.1 0.0 - 0.1 K/uL   Immature Granulocytes  0 %   Abs Immature Granulocytes 0.02 0.00 - 0.07 K/uL    Comment: Performed at [facility], [place] [address], [place], [place] [address]  Comprehensive metabolic panel     Status: Abnormal   Collection Time: 07/29/18 11:39 AM  Result Value Ref Range   Sodium 141 135 - 145 mmol/L   Potassium 3.8 3.5 - 5.1 mmol/L   Chloride 105 98 - 111 mmol/L   CO2 25 22 - 32 mmol/L   Glucose, Bld 81 70 - 99 mg/dL   BUN 6 6 - 20 mg/dL   Creatinine, Ser 0.45 0.44 - 1.00 mg/dL   Calcium 8.8 (L) 8.9 - 10.3 mg/dL   Total Protein 7.1 6.5 - 8.1 g/dL   Albumin 3.3 (L) 3.5 - 5.0 g/dL   AST 36 15 - 41 U/L   ALT 34 0 - 44 U/L   Alkaline Phosphatase 94 38 - 126 U/L   Total Bilirubin 0.3 0.3 - 1.2 mg/dL   GFR calc non Af Amer >60 >60 mL/min   GFR calc Af Amer >60 >60 mL/min   Anion gap 11 5 - 15    Comment: Performed at [facility] [address], [place], [place] [address]  D-dimer, quantitative     Status: Abnormal   Collection Time: 07/29/18 11:39 AM  Result Value Ref Range   D-Dimer, Quant 0.75 (H) 0.00 - 0.50 ug/mL-FEU    Comment: (NOTE) At the manufacturer cut-off of 0.50 ug/mL FEU, this assay has been documented to exclude PE with a sensitivity and negative predictive value of 97 to 99%.  At this time, this assay has not been approved by the FDA to exclude DVT/VTE. Results should be correlated with clinical presentation. Performed at [facility], [place] [address] [place], [place] [address]   Procalcitonin     Status: None   Collection Time: 07/29/18 11:39 AM  Result Value Ref Range   Procalcitonin <0.10 ng/mL    Comment:        Interpretation: PCT (Procalcitonin) <= 0.5 ng/mL: Systemic infection (sepsis) is not likely. Local bacterial infection is possible. REPEATED TO VERIFY (NOTE)       Sepsis PCT Algorithm           Lower Respiratory Tract                                      Infection PCT Algorithm    ----------------------------     ----------------------------         PCT < 0.25 ng/mL                PCT < 0.10 ng/mL         Strongly encourage             Strongly discourage   discontinuation of antibiotics    initiation of antibiotics    ----------------------------     -----------------------------       PCT 0.25 - 0.50 ng/mL            PCT 0.10 - 0.25 ng/mL               OR       >80% decrease in PCT            Discourage initiation of  antibiotics      Encourage discontinuation           of antibiotics    ----------------------------     -----------------------------         PCT >= 0.50 ng/mL              PCT 0.26 - 0.50 ng/mL                AND       <80% decrease in PCT             Encourage initiation of                                             antibiotics       Encourage continuation           of antibiotics     ----------------------------     -----------------------------        PCT >= 0.50 ng/mL                  PCT > 0.50 ng/mL               AND         increase in PCT                  Strongly encourage                                      initiation of antibiotics    Strongly encourage escalation           of antibiotics                                     -----------------------------                                           PCT <= 0.25 ng/mL                                                 OR                                        > 80% decrease in PCT                                     Discontinue / Do not initiate                                             antibiotics Performed at [facility], [place] [address], [place], [place] [address]   Lactate dehydrogenase     Status: Abnormal   Collection Time: 07/29/18  11:39 AM  Result Value Ref Range   LDH 246 (H) 98 - 192 U/L    Comment: Performed at [facility], [place] [address] [place], [place] [address]  Ferritin     Status: Abnormal   Collection Time: 07/29/18 11:39 AM  Result Value Ref Range   Ferritin 6 (L) 11 - 307 ng/mL    Comment: Performed at [facility], [place] [address], [place], [place] [address]  Triglycerides     Status: None   Collection Time: 07/29/18 11:39 AM  Result Value Ref Range   Triglycerides 75 <150 mg/dL    Comment: Performed at [facility] [address] [place], [place] [address]  Fibrinogen     Status: Abnormal   Collection Time: 07/29/18 11:39 AM  Result Value Ref Range   Fibrinogen 677 (H) 210 - 475 mg/dL    Comment: REPEATED TO VERIFY Performed at [facility] [address], [place], [place] [address]   C-reactive protein     Status: Abnormal   Collection Time: 07/29/18 11:39 AM  Result Value Ref Range   CRP 2.6 (H) <1.0 mg/dL    Comment: Performed at [facility] [address], [place], [place] [address]  Brain natriuretic peptide     Status: None    Collection Time: 07/29/18 11:39 AM  Result Value Ref Range   B Natriuretic Peptide 44.4 0.0 - 100.0 pg/mL    Comment: Performed at [facility] [address], [place], [place] [address]  Troponin I (High Sensitivity)     Status: None   Collection Time: 07/29/18 11:39 AM  Result Value Ref Range   Troponin I (High Sensitivity) 6 <18 ng/L    Comment: (NOTE) Elevated high sensitivity troponin I (hsTnI) values and significant  changes across serial measurements may suggest ACS but many other  chronic and acute conditions are known to elevate hsTnI results.  Refer to the "Links" section for chest pain algorithms and additional  guidance. Performed at [facility], [place] [address], [place], [place] [address]   SARS Coronavirus 2 [facility] order, Performed in [facility] hospital lab)     Status: None   Collection Time: 07/29/18 11:52 AM   Specimen: Nasopharyngeal Swab  Result Value Ref Range   SARS Coronavirus 2 NEGATIVE NEGATIVE    Comment: (NOTE) If result is NEGATIVE SARS-CoV-2 target nucleic acids are NOT DETECTED. The SARS-CoV-2 RNA is generally detectable in upper and lower  respiratory specimens during the acute phase of infection. The lowest  concentration of SARS-CoV-2 viral copies this assay can detect is 250  copies / mL. A negative result does not preclude SARS-CoV-2 infection  and should not be used as the sole basis for treatment or other  patient management decisions.  A negative result may occur with  improper specimen collection / handling, submission of specimen other  than nasopharyngeal swab, presence of viral mutation(s) within the  areas targeted by this assay, and inadequate number of viral copies  (<250 copies / mL). A negative result must be combined with clinical  observations, patient history, and epidemiological information. If result is POSITIVE SARS-CoV-2 target nucleic acids are DETECTED. The SARS-CoV-2 RNA is generally detectable in upper  and lower  respiratory specimens dur ing the acute phase of infection.  Positive  results are indicative of active infection with SARS-CoV-2.  Clinical  correlation with patient history and other diagnostic information is  necessary to  determine patient infection status.  Positive results do  not rule out bacterial infection or co-infection with other viruses. If result is PRESUMPTIVE POSTIVE SARS-CoV-2 nucleic acids MAY BE PRESENT.   A presumptive positive result was obtained on the submitted specimen  and confirmed on repeat testing.  While 2019 novel coronavirus  (SARS-CoV-2) nucleic acids may be present in the submitted sample  additional confirmatory testing may be necessary for epidemiological  and / or clinical management purposes  to differentiate between  SARS-CoV-2 and other Sarbecovirus currently known to infect humans.  If clinically indicated additional testing with an alternate test  methodology [phone]) is advised. The SARS-CoV-2 RNA is generally  detectable in upper and lower respiratory sp ecimens during the acute  phase of infection. The expected result is Negative. Fact Sheet for Patients:  StrictlyIdeas.no Fact Sheet for Healthcare Providers: BankingDealers.co.za This test is not yet approved or cleared by the [place] FDA and has been authorized for detection and/or diagnosis of SARS-CoV-2 by FDA under an Emergency Use Authorization (EUA).  This EUA will remain in effect (meaning this test can be used) for the duration of the COVID-19 declaration under Section 564(b)(1) of the Act, 21 U.S.C. section 360bbb-3(b)(1), unless the authorization is terminated or revoked sooner. Performed at [facility], [place] [address] [place], [place] [address]   I-Stat beta hCG blood, ED     Status: None   Collection Time: 07/29/18 12:06 PM  Result Value Ref Range   I-stat hCG, quantitative <5.0 <5 mIU/mL   Comment 3             Comment:   GEST. AGE      CONC.  (mIU/mL)   <=1 WEEK        5 - 50     2 WEEKS       50 - 500     3 WEEKS       100 - 10,000     4 WEEKS     1,000 - 30,000        FEMALE AND NON-PREGNANT FEMALE:     LESS THAN 5 mIU/mL    Dg Chest Port 1 View  Result Date: 07/29/2018 CLINICAL DATA:  Shortness of breath, cough EXAM: PORTABLE CHEST 1 VIEW COMPARISON:  03/06/2013 FINDINGS: There is no focal consolidation. There is no pleural effusion or pneumothorax. The cardiac silhouette is relatively enlarged which may be projectional given the AP view. There is no acute osseous abnormality. IMPRESSION: No active disease. Electronically Signed   By: Kathreen Devoid   On: 07/29/2018 12:12    Pending Labs Unresulted Labs (From admission, onward)    Start     Ordered   07/29/18 1531  Group A Strep by PCR  Once,   STAT     07/29/18 1531   07/29/18 1506  Culture, sputum-assessment  Once,   R     07/29/18 1505   07/29/18 1506  Legionella Pneumophila Serogp 1 Ur Ag  Once,   STAT     07/29/18 1505   07/29/18 1506  Strep pneumoniae urinary antigen  Once,   STAT     07/29/18 1505   07/29/18 1506  Respiratory Panel by PCR  (Respiratory virus panel with precautions)  Once,   STAT     07/29/18 1505   07/29/18 1139  Lactic acid, plasma  Now then every 2 hours,   STAT     07/29/18 1138   07/29/18 1139  Blood Culture (  routine x 2)  BLOOD CULTURE X 2,   STAT     07/29/18 1138   07/29/18 1139  Troponin I (High Sensitivity)  STAT Now then every 2 hours,   STAT     07/29/18 1138   Signed and Held  HIV antibody (Routine Testing)  Once,   R     Signed and Held   Signed and Held  CBC  Tomorrow morning,   R     Signed and Held   Signed and Held  Basic metabolic panel  Tomorrow morning,   R     Signed and Held          Vitals/Pain Today's Vitals   07/29/18 1145 07/29/18 1151 07/29/18 1200 07/29/18 1315  BP: 118/61  122/60 110/63  Pulse: 81 92 87 91  Resp: 18 20 (!) 21 (!) 24  Temp:      TempSrc:       SpO2: 99%  (!) 89% 96%  Weight:      Height:      PainSc:        Isolation Precautions Droplet precaution  Medications Medications  albuterol (VENTOLIN HFA) 108 (90 Base) MCG/ACT inhaler 2 puff (2 puffs Inhalation Not Given 07/29/18 1148)  ipratropium (ATROVENT HFA) inhaler 2 puff (2 puffs Inhalation Given 07/29/18 1227)  methylPREDNISolone sodium succinate (SOLU-MEDROL) 125 mg/2 mL injection 125 mg (has no administration in time range)  guaiFENesin (MUCINEX) 12 hr tablet 600 mg (has no administration in time range)  nicotine (NICODERM CQ - dosed in mg/24 hours) patch 21 mg (has no administration in time range)  butalbital-acetaminophen-caffeine (FIORICET) 50-325-40 MG per tablet 1 tablet (has no administration in time range)  butalbital-acetaminophen-caffeine (FIORICET) 50-325-40 MG per tablet 1 tablet (has no administration in time range)  albuterol (VENTOLIN HFA) 108 (90 Base) MCG/ACT inhaler 4 puff (4 puffs Inhalation Given 07/29/18 1147)    Mobility walks     Focused Assessments Pulmonary Assessment Handoff:  Lung sounds: Bilateral Breath Sounds: Inspiratory wheezes L Breath Sounds: Diminished, Inspiratory wheezes R Breath Sounds: Diminished, Inspiratory wheezes O2 Device: Nasal Cannula O2 Flow Rate (L/min): 5 L/min      R Recommendations: See Admitting Provider Note  Report given to:   Additional Notes: .

## 2018-07-29 NOTE — ED Provider Notes (Signed)
[facility] EMERGENCY DEPARTMENT Provider Note   CSN: [id number] Arrival date & time: 07/29/18  1053    History   Chief Complaint Chief Complaint  Patient presents with  . Shortness of Breath  . Headache  . Chest Pain    HPI Marissa [name] is a 53 y.o. female.     HPI Patient reports she has been sick for about a week.  She has had increased coughing and chest pain with coughing.  Reports that she is felt like she had cold symptoms.  She has been hot and cold thought maybe she had a fever.  She reports he just got a point where she has having a lot of difficulty breathing.  She does smoke typically 2 packs/day and has had to cut back.  She has not seen a doctor in many years.  No exposure to anyone with COVID that she is aware of. Past Medical History:  Diagnosis Date  . Asthma     Patient Active Problem List   Diagnosis Date Noted  . Respiratory failure with hypoxia ([place]) 07/29/2018  . Elevated d-dimer 07/29/2018  . Headache 07/29/2018  . Microcytic anemia 07/29/2018  . Tobacco abuse 07/29/2018    Past Surgical History:  Procedure Laterality Date  . TUBAL LIGATION       OB History   No obstetric history on file.      Home Medications    Prior to Admission medications   Medication Sig Start Date End Date Taking? Authorizing Provider  ePHEDrine-guaiFENesin (BRONKAID) 25-400 MG TABS Take 1 tablet by mouth as needed (shortness of breath).    Yes [provider]  cephALEXin (KEFLEX) 500 MG capsule Take 1 capsule (500 mg total) by mouth 2 (two) times daily. Patient not taking: Reported on 07/29/2018 04/29/14   Clayton Bibles, PA-C  diphenhydrAMINE (BENADRYL) 25 MG tablet Take 1 tablet (25 mg total) by mouth every 6 (six) hours as needed for itching ([place]). Patient not taking: Reported on 07/29/2018 08/19/13   Piepenbrink, Anderson [place], PA-C  HYDROcodone-acetaminophen (HYCET) 7.5-325 mg/15 ml solution Take 10 mLs by mouth 4 (four) times daily as needed for  moderate pain or severe pain. Patient not taking: Reported on 07/29/2018 04/29/14   Clayton Bibles, PA-C  ibuprofen (ADVIL,MOTRIN) 800 MG tablet Take 1 tablet (800 mg total) by mouth every 8 (eight) hours as needed for mild pain or moderate pain. Patient not taking: Reported on 07/29/2018 04/29/14   Clayton Bibles, PA-C  predniSONE (DELTASONE) 20 MG tablet Take 2 tablets (40 mg total) by mouth daily. Patient not taking: Reported on 07/29/2018 08/19/13   Baron Sane, PA-C    Family History Family History  Problem Relation Age of Onset  . Heart disease Mother   . Dementia Father     Social History Social History   Tobacco Use  . Smoking status: Current Every Day Smoker    Packs/day: 0.50    Types: Cigarettes  . Smokeless tobacco: Never Used  Substance Use Topics  . Alcohol use: Yes  . Drug use: No     Allergies   Penicillins   Review of Systems Review of Systems 10 Systems reviewed and are negative for acute change except as noted in the HPI.  Physical Exam Updated Vital Signs BP 126/67   Pulse 92   Temp 97.8 F (36.6 C) (Oral)   Resp 18   Ht 5\' 2"  (1.575 m)   Wt 82.6 kg   SpO2 96%   BMI 33.29 kg/m  Physical Exam Constitutional:      Comments: Patient is alert.  Mild increased work of breathing at rest.  Intermittent cough.  HENT:     Head: Normocephalic and atraumatic.     Mouth/Throat:     Mouth: Mucous membranes are dry.  Eyes:     Extraocular Movements: Extraocular movements intact.  Cardiovascular:     Comments:  Tachycardia no gross rub murmur gallop Pulmonary:     Comments: Mild increased work of breathing at rest.  Very soft breath sounds throughout occasional expiratory wheeze Abdominal:     General: There is no distension.     Palpations: Abdomen is soft.     Tenderness: There is no abdominal tenderness. There is no guarding.  Musculoskeletal: Normal range of motion.        General: No swelling or tenderness.     Right lower leg: No edema.      Left lower leg: No edema.  Skin:    General: Skin is warm and dry.  Neurological:     General: No focal deficit present.     Mental Status: She is oriented to person, place, and time.     Coordination: Coordination normal.  Psychiatric:        Mood and Affect: Mood normal.      ED Treatments / Results  Labs (all labs ordered are listed, but only abnormal results are displayed) Labs Reviewed  CBC WITH DIFFERENTIAL/PLATELET - Abnormal; Notable for the following components:      Result Value   Hemoglobin 9.4 (*)    MCV 77.1 (*)    MCH 20.1 (*)    MCHC 26.0 (*)    RDW 20.5 (*)    Platelets 476 (*)    All other components within normal limits  COMPREHENSIVE METABOLIC PANEL - Abnormal; Notable for the following components:   Calcium 8.8 (*)    Albumin 3.3 (*)    All other components within normal limits  D-DIMER, QUANTITATIVE (NOT AT [facility]) - Abnormal; Notable for the following components:   D-Dimer, Quant 0.75 (*)    All other components within normal limits  LACTATE DEHYDROGENASE - Abnormal; Notable for the following components:   LDH 246 (*)    All other components within normal limits  FERRITIN - Abnormal; Notable for the following components:   Ferritin 6 (*)    All other components within normal limits  FIBRINOGEN - Abnormal; Notable for the following components:   Fibrinogen 677 (*)    All other components within normal limits  C-REACTIVE PROTEIN - Abnormal; Notable for the following components:   CRP 2.6 (*)    All other components within normal limits  SARS CORONAVIRUS 2 (HOSPITAL ORDER, [place] [facility])  CULTURE, BLOOD (ROUTINE X 2)  CULTURE, BLOOD (ROUTINE X 2)  EXPECTORATED SPUTUM ASSESSMENT W REFEX TO RESP CULTURE  RESPIRATORY PANEL BY PCR  GROUP A STREP BY PCR  LACTIC ACID, PLASMA  PROCALCITONIN  TRIGLYCERIDES  BRAIN NATRIURETIC PEPTIDE  TROPONIN I (HIGH SENSITIVITY)  LACTIC ACID, PLASMA  TROPONIN I (HIGH SENSITIVITY)  LEGIONELLA  PNEUMOPHILA SEROGP 1 UR AG  STREP PNEUMONIAE URINARY ANTIGEN  I-STAT BETA HCG BLOOD, ED ([facility], [facility], [facility] ONLY)    EKG EKG Interpretation  Date/Time:  Thursday July 29 2018 11:09:59 EDT Ventricular Rate:  88 PR Interval:    QRS Duration: 85 QT Interval:  333 QTC Calculation: 403 R Axis:   114 Text Interpretation:  Sinus rhythm Left posterior fascicular block  Anterior infarct, old no sig change from previous Confirmed by Charlesetta Shanks [phone]) on 07/29/2018 12:28:52 PM   Radiology Dg Chest Port 1 View  Result Date: 07/29/2018 CLINICAL DATA:  Shortness of breath, cough EXAM: PORTABLE CHEST 1 VIEW COMPARISON:  03/06/2013 FINDINGS: There is no focal consolidation. There is no pleural effusion or pneumothorax. The cardiac silhouette is relatively enlarged which may be projectional given the AP view. There is no acute osseous abnormality. IMPRESSION: No active disease. Electronically Signed   By: Kathreen Devoid   On: 07/29/2018 12:12    Procedures Procedures (including critical care time)  Medications Ordered in ED Medications  albuterol (VENTOLIN HFA) 108 (90 Base) MCG/ACT inhaler 2 puff (2 puffs Inhalation Given 07/29/18 1613)  ipratropium (ATROVENT HFA) inhaler 2 puff (2 puffs Inhalation Given 07/29/18 1613)  methylPREDNISolone sodium succinate (SOLU-MEDROL) 125 mg/2 mL injection 125 mg (has no administration in time range)  guaiFENesin (MUCINEX) 12 hr tablet 600 mg (has no administration in time range)  nicotine (NICODERM CQ - dosed in mg/24 hours) patch 21 mg (has no administration in time range)  butalbital-acetaminophen-caffeine (FIORICET) 50-325-40 MG per tablet 1 tablet (has no administration in time range)  butalbital-acetaminophen-caffeine (FIORICET) 50-325-40 MG per tablet 1 tablet (has no administration in time range)  pantoprazole (PROTONIX) EC tablet 40 mg (has no administration in time range)  loratadine (CLARITIN) tablet 10 mg (has no administration in time range)  albuterol  (VENTOLIN HFA) 108 (90 Base) MCG/ACT inhaler 4 puff (4 puffs Inhalation Given 07/29/18 1147)     Initial Impression / Assessment and Plan / ED Course  I have reviewed the triage vital signs and the nursing notes.  Pertinent labs & imaging results that were available during my care of the patient were reviewed by me and considered in my medical decision making (see chart for details).  Clinical Course as of Jul 28 1620  Thu Jul 29, 2018  1452 Consult: Dr. Tamala Julian for tried hospitalist for admission.   [MP]    Clinical Course User Index [MP] Charlesetta Shanks, MD      Patient presents with symptoms outlined above.  There was concern for COVID however patient has tested negative.  She has no known medical history but with very heavy smoking history and symptoms very consistent with COPD.  Will need admission for hypoxia and management for COPD.  Marissa [name] was evaluated in Emergency Department on 07/29/2018 for the symptoms described in the history of present illness. She was evaluated in the context of the global COVID-19 pandemic, which necessitated consideration that the patient might be at risk for infection with the SARS-CoV-2 virus that causes COVID-19. Institutional protocols and algorithms that pertain to the evaluation of patients at risk for COVID-19 are in a state of rapid change based on information released by regulatory bodies including the CDC and federal and state organizations. These policies and algorithms were followed during the patient's care in the ED.  Final Clinical Impressions(s) / ED Diagnoses   Final diagnoses:  COPD exacerbation [facility])  Hypoxia    ED Discharge Orders    None       Charlesetta Shanks, MD 07/29/18 1625

## 2018-07-30 DIAGNOSIS — Z79899 Other long term (current) drug therapy: Secondary | ICD-10-CM | POA: Diagnosis not present

## 2018-07-30 DIAGNOSIS — E669 Obesity, unspecified: Secondary | ICD-10-CM | POA: Diagnosis present

## 2018-07-30 DIAGNOSIS — R0902 Hypoxemia: Secondary | ICD-10-CM | POA: Diagnosis present

## 2018-07-30 DIAGNOSIS — D509 Iron deficiency anemia, unspecified: Secondary | ICD-10-CM | POA: Diagnosis present

## 2018-07-30 DIAGNOSIS — J45901 Unspecified asthma with (acute) exacerbation: Secondary | ICD-10-CM | POA: Diagnosis present

## 2018-07-30 DIAGNOSIS — Z818 Family history of other mental and behavioral disorders: Secondary | ICD-10-CM | POA: Diagnosis not present

## 2018-07-30 DIAGNOSIS — Z7952 Long term (current) use of systemic steroids: Secondary | ICD-10-CM | POA: Diagnosis not present

## 2018-07-30 DIAGNOSIS — J9601 Acute respiratory failure with hypoxia: Secondary | ICD-10-CM | POA: Diagnosis present

## 2018-07-30 DIAGNOSIS — R51 Headache: Secondary | ICD-10-CM

## 2018-07-30 DIAGNOSIS — R0602 Shortness of breath: Secondary | ICD-10-CM

## 2018-07-30 DIAGNOSIS — Z791 Long term (current) use of non-steroidal anti-inflammatories (NSAID): Secondary | ICD-10-CM | POA: Diagnosis not present

## 2018-07-30 DIAGNOSIS — Z1159 Encounter for screening for other viral diseases: Secondary | ICD-10-CM | POA: Diagnosis not present

## 2018-07-30 DIAGNOSIS — Z6834 Body mass index (BMI) 34.0-34.9, adult: Secondary | ICD-10-CM | POA: Diagnosis not present

## 2018-07-30 DIAGNOSIS — J441 Chronic obstructive pulmonary disease with (acute) exacerbation: Secondary | ICD-10-CM | POA: Diagnosis present

## 2018-07-30 DIAGNOSIS — Z23 Encounter for immunization: Secondary | ICD-10-CM | POA: Diagnosis not present

## 2018-07-30 DIAGNOSIS — F17210 Nicotine dependence, cigarettes, uncomplicated: Secondary | ICD-10-CM | POA: Diagnosis present

## 2018-07-30 DIAGNOSIS — Z79891 Long term (current) use of opiate analgesic: Secondary | ICD-10-CM | POA: Diagnosis not present

## 2018-07-30 DIAGNOSIS — Z8249 Family history of ischemic heart disease and other diseases of the circulatory system: Secondary | ICD-10-CM | POA: Diagnosis not present

## 2018-07-30 DIAGNOSIS — R590 Localized enlarged lymph nodes: Secondary | ICD-10-CM

## 2018-07-30 DIAGNOSIS — R918 Other nonspecific abnormal finding of lung field: Secondary | ICD-10-CM

## 2018-07-30 LAB — LEGIONELLA PNEUMOPHILA SEROGP 1 UR AG: L. pneumophila Serogp 1 Ur Ag: NEGATIVE

## 2018-07-30 LAB — CBC
HCT: 35.4 % — ABNORMAL LOW (ref 36.0–46.0)
Hemoglobin: 9.3 g/dL — ABNORMAL LOW (ref 12.0–15.0)
MCH: 20.3 pg — ABNORMAL LOW (ref 26.0–34.0)
MCHC: 26.3 g/dL — ABNORMAL LOW (ref 30.0–36.0)
MCV: 77.1 fL — ABNORMAL LOW (ref 80.0–100.0)
Platelets: 447 10*3/uL — ABNORMAL HIGH (ref 150–400)
RBC: 4.59 MIL/uL (ref 3.87–5.11)
RDW: 20.3 % — ABNORMAL HIGH (ref 11.5–15.5)
WBC: 9.5 10*3/uL (ref 4.0–10.5)
nRBC: 0 % (ref 0.0–0.2)

## 2018-07-30 LAB — BASIC METABOLIC PANEL
Anion gap: 8 (ref 5–15)
BUN: 12 mg/dL (ref 6–20)
CO2: 29 mmol/L (ref 22–32)
Calcium: 9.3 mg/dL (ref 8.9–10.3)
Chloride: 106 mmol/L (ref 98–111)
Creatinine, Ser: 0.47 mg/dL (ref 0.44–1.00)
GFR calc Af Amer: >60 mL/min (ref >60)
GFR calc non Af Amer: >60 mL/min (ref >60)
Glucose, Bld: 135 mg/dL — ABNORMAL HIGH (ref 70–99)
Potassium: 4.4 mmol/L (ref 3.5–5.1)
Sodium: 143 mmol/L (ref 135–145)

## 2018-07-30 LAB — IRON AND TIBC
Iron: 11 ug/dL — ABNORMAL LOW (ref 28–170)
Saturation Ratios: 2 % — ABNORMAL LOW (ref 10.4–31.8)
TIBC: 605 ug/dL — ABNORMAL HIGH (ref 250–450)
UIBC: 594 ug/dL

## 2018-07-30 LAB — FERRITIN: Ferritin: 6 ng/mL — ABNORMAL LOW (ref 11–307)

## 2018-07-30 MED ORDER — ORAL CARE MOUTH RINSE
15.0000 mL | Freq: Two times a day (BID) | OROMUCOSAL | Status: DC
  Administered 2018-07-30 – 2018-07-31 (×2): 15 mL via OROMUCOSAL

## 2018-07-30 MED ORDER — IPRATROPIUM-ALBUTEROL 0.5-2.5 (3) MG/3ML IN SOLN
3.0000 mL | Freq: Three times a day (TID) | RESPIRATORY_TRACT | Status: DC
  Administered 2018-07-30 – 2018-07-31 (×3): 3 mL via RESPIRATORY_TRACT
  Filled 2018-07-30 (×3): qty 3

## 2018-07-30 MED ORDER — NICOTINE POLACRILEX 2 MG MT GUM
2.0000 mg | CHEWING_GUM | OROMUCOSAL | Status: DC | PRN
  Administered 2018-07-30 (×2): 2 mg via ORAL
  Filled 2018-07-30 (×3): qty 1

## 2018-07-30 MED ORDER — SODIUM CHLORIDE 0.9 % IV SOLN
510.0000 mg | Freq: Once | INTRAVENOUS | Status: AC
  Administered 2018-07-30: 510 mg via INTRAVENOUS
  Filled 2018-07-30: qty 17

## 2018-07-30 MED ORDER — DOXYCYCLINE HYCLATE 100 MG PO TABS
100.0000 mg | ORAL_TABLET | Freq: Two times a day (BID) | ORAL | Status: DC
  Administered 2018-07-30 – 2018-07-31 (×3): 100 mg via ORAL
  Filled 2018-07-30 (×4): qty 1

## 2018-07-30 NOTE — Evaluation (Signed)
Physical Therapy Evaluation Patient Details Name: Marissa [name] MRN: [id number] DOB: 12-21-1965 Today's Date: 07/30/2018   History of Present Illness  Admitted with Acute respiratory failure secondary to asthma/COPD exacerbation  Clinical Impression   Pt admitted with above diagnosis. Pt currently with functional limitations due to the deficits listed below (see PT Problem List). Managing independently prior to admission; Presents to PT with decr functional capacity; Recommend supplemental O2 with activity at this time; See other PT note of this date;  Pt will benefit from skilled PT to increase their independence and safety with mobility to allow discharge to the venue listed below.       Follow Up Recommendations No PT follow up;Other (comment)(Close pulmonology follow)    Equipment Recommendations  Other (comment)(Oxygen)    Recommendations for Other Services       Precautions / Restrictions Precautions Precautions: Other (comment) Precaution Comments: watch O2 sats      Mobility  Bed Mobility Overal bed mobility: Independent                Transfers Overall transfer level: Modified independent               General transfer comment: No gross difficulty  Ambulation/Gait Ambulation/Gait assistance: Min guard;Supervision Gait Distance (Feet): 100 Feet Assistive device: None(and occasionally pushing vitals machine) Gait Pattern/deviations: Step-through pattern     General Gait Details: Cues to self-monitor for activity tolerance; significant desaturation on Room Air; see other note of this date  Stairs            Wheelchair Mobility    Modified Rankin (Stroke Patients Only)       Balance                                             Pertinent Vitals/Pain Pain Assessment: No/denies pain    Home Living Family/patient expects to be discharged to:: Private residence Living Arrangements: Spouse/significant other;Other  (Comment)(and roommate) Available Help at Discharge: Family;Available PRN/intermittently Type of Home: House Home Access: Stairs to enter   [employer] of Steps: 3 Home Layout: One level Home Equipment: None      Prior Function Level of Independence: Independent               Hand Dominance        Extremity/Trunk Assessment   Upper Extremity Assessment Upper Extremity Assessment: Overall WFL for tasks assessed(for simple tasks)    Lower Extremity Assessment Lower Extremity Assessment: Generalized weakness       Communication   Communication: No difficulties  Cognition Arousal/Alertness: Awake/alert Behavior During Therapy: WFL for tasks assessed/performed Overall Cognitive Status: Within Functional Limits for tasks assessed                                        General Comments General comments (skin integrity, edema, etc.): desatted to 82% on Room Air; O2 restarted at 3 L via Standard and sats incr to 93-95%    Exercises     Assessment/Plan    PT Assessment Patient needs continued PT services  PT Problem List Decreased activity tolerance;Decreased balance;Cardiopulmonary status limiting activity       PT Treatment Interventions DME instruction;Gait training;Stair training;Functional mobility training;Therapeutic activities;Therapeutic exercise;Patient/family education    PT Goals (Current goals can be  found in the Care Plan section)  Acute Rehab PT Goals Patient Stated Goal: Hopes to be home soon; hopes she will not need supplemental O2 PT Goal Formulation: With patient Time For Goal Achievement: 08/13/18 Potential to Achieve Goals: Good    Frequency Min 3X/week   Barriers to discharge        Co-evaluation               AM-PAC PT "6 Clicks" Mobility  Outcome Measure Help needed turning from your back to your side while in a flat bed without using bedrails?: None Help needed moving from lying on your back to  sitting on the side of a flat bed without using bedrails?: None Help needed moving to and from a bed to a chair (including a wheelchair)?: None Help needed standing up from a chair using your arms (e.g., wheelchair or bedside chair)?: None Help needed to walk in hospital room?: A Little Help needed climbing 3-5 steps with a railing? : A Little 6 Click Score: 22    End of Session Equipment Utilized During Treatment: Gait belt;Oxygen Activity Tolerance: Patient tolerated treatment well Patient left: in chair;with call bell/phone within reach Nurse Communication: Mobility status;Other (comment)(O2 sat values) PT Visit Diagnosis: Other abnormalities of gait and mobility (R26.89)    Time: 9379-0240 PT Time Calculation (min) (ACUTE ONLY): 23 min   Charges:   PT Evaluation $PT Eval Moderate Complexity: 1 Mod PT Treatments $Gait Training: 8-22 mins        Roney Marion, PT  Acute Rehabilitation Services Pager [phone] Office [phone]   [place] 07/30/2018, 3:37 PM

## 2018-07-30 NOTE — Progress Notes (Signed)
Physical Therapy Note  (See also other PT note of this date for full PT evaluation)  SATURATION QUALIFICATIONS: (This note is used to comply with regulatory documentation for home oxygen)  Patient Saturations on Room Air at Rest = 88%  Patient Saturations on Room Air while Ambulating = 82%  Patient Saturations on 3 Liters of oxygen while Ambulating = 92%  Please briefly explain why patient needs home oxygen: Patient requires supplemental oxygen to maintain oxygen saturations at acceptable, safe levels with physical activity.  Roney Marion, [place]  Acute Rehabilitation Services Pager [phone] Office [phone]

## 2018-07-30 NOTE — TOC Initial Note (Signed)
Transition of Care [facility]) - Initial/Assessment Note    Patient Details  Name: Marissa [name] MRN: [id number] Date of Birth: 09/24/1965  Transition of Care [facility]) CM/SW Contact:    Sharin Mons, RN Phone Number: 07/30/2018, 4:08 PM  Clinical Narrative:   Admitted with Acute respiratory failure secondary to asthma/COPD exacerbation.           From home with husband who works out of town, and roommate Timmothy Sours [phone]). Pt states roommate will help if needed @ d/c.  Pt without insurance , jobless. No PCP. [facility] shared with pt per NCM and provided  Brochure. If d/c over the week end pt and NCM will f/u on Monday to establish post hospital f/u with [facility]/ Dr. Joya Gaskins. Pt will probably need Match Letter vs GoodRx card @ dc/ to assist with medication cost.... NCM to f/u with needs.  Referral made with [employer]  For home oxygen, charity case. Awaiting approval....  Expected Discharge Plan: Home/Self Care Barriers to Discharge: Continued Medical Work up   Patient Goals and CMS Choice        Expected Discharge Plan and Services Expected Discharge Plan: Home/Self Care   Discharge Planning Services: CM Consult, [place] Acute Care Choice: Durable Medical Equipment Living arrangements for the past 2 months: Single Family Home                 DME Arranged: Oxygen(Pending charity approval) DME Agency: [employer](pending charity approval)           Date HH Agency Contacted: 07/30/18 Time HH Agency Contacted: 1606 Representative spoke with at [place] Housing: [place]  Prior Living Arrangements/Services Living arrangements for the past 2 months: [place] with:: Spouse, Roommate Patient language and need for interpreter reviewed:: Yes Do you feel safe going back to the place where you live?: Yes      Need for Family Participation in Patient Care: No (Comment) Care giver support system in place?: Yes (comment)   Criminal Activity/Legal Involvement  Pertinent to Current Situation/Hospitalization: No - Comment as needed  Activities of Daily Living Home Assistive Devices/Equipment: Eyeglasses ADL Screening (condition at time of admission) Patient's cognitive ability adequate to safely complete daily activities?: Yes Is the patient deaf or have difficulty hearing?: No Does the patient have difficulty seeing, even when wearing glasses/contacts?: No Does the patient have difficulty concentrating, remembering, or making decisions?: No Patient able to express need for assistance with ADLs?: Yes Does the patient have difficulty dressing or bathing?: No Independently performs ADLs?: Yes (appropriate for developmental age) Does the patient have difficulty walking or climbing stairs?: No Weakness of Legs: None Weakness of Arms/Hands: None  Permission Sought/Granted Permission sought to share information with : Case Manager, Family Supports                Emotional Assessment Appearance:: Appears older than stated age Attitude/Demeanor/Rapport: Gracious Affect (typically observed): Accepting Orientation: : Oriented to Self, Oriented to Place, Oriented to  Time, Oriented to Situation Alcohol / Substance Use: Not Applicable Psych Involvement: No (comment)  Admission diagnosis:  Hypoxia [R09.02] COPD exacerbation ([place]) [J44.1] Patient Active Problem List   Diagnosis Date Noted  . Respiratory failure with hypoxia ([place]) 07/29/2018  . Elevated d-dimer 07/29/2018  . Headache 07/29/2018  . Microcytic anemia 07/29/2018  . Tobacco abuse 07/29/2018   PCP:  Patient, No Pcp Per Pharmacy:   [facility] ([place]), [place] - [address] [place] ([place]) [place]  [address] Phone: [phone] Fax: [phone]     Social Determinants of Health (SDOH) Interventions    Readmission Risk Interventions No flowsheet data found.

## 2018-07-30 NOTE — Consult Note (Signed)
NAME:  Marissa [name], MRN:  [id number], DOB:  May 07, 1965, LOS: 0 ADMISSION DATE:  07/29/2018, CONSULTATION DATE:  07/30/2018 REFERRING MD:  TRH Eliseo Squires, CHIEF COMPLAINT:  Mediastinal LAN   Brief History   53 year old smoker with history of asthma who presents to the hospital with progressive SOB x1 week.  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  PCCM was called on consultation.  Patient has a history of 78 pack years of smoking and continues to smoke.  She denied any hemoptysis or unintentional weight loss.  Does report that SOB started with exertion and mild at rest.  CTA showed no PE.  History of present illness   53 year old smoker with history of asthma who presents to the hospital with progressive SOB x1 week.  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  PCCM was called on consultation.  Patient has a history of 78 pack years of smoking and continues to smoke.  She denied any hemoptysis or unintentional weight loss.  Does report that SOB started with exertion and mild at rest.  CTA showed no PE.  Past Medical History  Asthma Tobacco abuse  Significant Hospital Events   None  Consults:  PCCM  Procedures:  N/A  Significant Diagnostic Tests:  CTA that I reviewed myself showing a LUL speculated lung mass and a extensive mediastinal LAN  Micro Data:  RVP negative COVID-19 negative  Blood culture 7/2>>> NTD Strep negative  Sputum 7/2 not collected  Antimicrobials:  Rocephin 7/2>>>   Interim history/subjective:  SOB improving with O2, feels better  Objective   Blood pressure 135/60, pulse 91, temperature 98 F (36.7 C), temperature source Oral, resp. rate 20, height 5\' 2"  (1.575 m), weight 85.1 kg, last menstrual period 04/24/2012, SpO2 94 %.    FiO2 (%):  [40 %] 40 %   Intake/Output Summary (Last 24 hours) at 07/30/2018 1306 Last data filed at 07/30/2018 0308 Gross  per 24 hour  Intake 100 ml  Output -  Net 100 ml   Filed Weights   07/29/18 1108 07/29/18 1629  Weight: 82.6 kg 85.1 kg    Examination: General: Well appearing, NAD HENT: [place]/AT, PERRL, EOM-I and MMM Lungs: Distant but clear bilaterally Cardiovascular: RRR, Nl S1/S2 and -M/R/G Abdomen: Soft, NT, ND and +BS Extremities: -edema and -tenderness Neuro: Alert and interactive, moving all ext to command Skin: Intact  Resolved Hospital Problem list   N/A  Assessment & Plan:  53 year old female smoker with asthma presenting with SOB, hypoxemia, lung mass and mediastinal LAN.  Discussed with PCCM-NP.  SOB: likely COPD  - Need PFTs as outpatient  - F/U in the pulmonary clinic  - PO prednisone with a fast taper  - Would change rocephin to PO course of doxy since patient is not displaying overt signs of infection  Hypoxemia:  - Titrate O2 for sat of 88-92%  - If remains hypoxic may need an ambulatory desaturation study prior to discharge for home O2  Lung mass: too small to sample but mediastinal LAN is not  Mediastinal LAN:  - Dr. Tamala Julian to arrange for EBUS next week  - If patient would like to go home can be discharged and f/u as outpatient  Tobacco abuse:  - Smoking cessation recommended  Dr. Tamala Julian to follow up  Labs   CBC: Recent Labs  Lab 07/29/18 1139  07/30/18 0247  WBC 6.4 9.5  NEUTROABS 4.1  --   HGB 9.4* 9.3*  HCT 36.1 35.4*  MCV 77.1* 77.1*  PLT 476* 447*    Basic Metabolic Panel: Recent Labs  Lab 07/29/18 1139 07/30/18 0247  NA 141 143  K 3.8 4.4  CL 105 106  CO2 25 29  GLUCOSE 81 135*  BUN 6 12  CREATININE 0.45 0.47  CALCIUM 8.8* 9.3   GFR: Estimated Creatinine Clearance: 83.2 mL/min (by C-G formula based on SCr of 0.47 mg/dL). Recent Labs  Lab 07/29/18 1139 07/29/18 1719 07/30/18 0247  PROCALCITON <0.10  --   --   WBC 6.4  --  9.5  LATICACIDVEN 0.6 0.7  --     Liver Function Tests: Recent Labs  Lab 07/29/18 1139  AST 36  ALT 34   ALKPHOS 94  BILITOT 0.3  PROT 7.1  ALBUMIN 3.3*   No results for input(s): LIPASE, AMYLASE in the last 168 hours. No results for input(s): AMMONIA in the last 168 hours.  ABG No results found for: PHART, PCO2ART, PO2ART, HCO3, TCO2, ACIDBASEDEF, O2SAT   Coagulation Profile: No results for input(s): INR, PROTIME in the last 168 hours.  Cardiac Enzymes: No results for input(s): CKTOTAL, CKMB, CKMBINDEX, TROPONINI in the last 168 hours.  HbA1C: No results found for: HGBA1C  CBG: No results for input(s): GLUCAP in the last 168 hours.  Review of Systems:   12 point ROS is negative other than above  Past Medical History  She,  has a past medical history of Asthma, Depression, and Heart murmur.   Surgical History    Past Surgical History:  Procedure Laterality Date  . TUBAL LIGATION       Social History   reports that she has been smoking cigarettes. She has been smoking about 0.50 packs per day. She has never used smokeless tobacco. She reports current alcohol use. She reports that she does not use drugs.   Family History   Her family history includes Dementia in her father; Heart disease in her mother.   Allergies Allergies  Allergen Reactions  . Penicillins Nausea Only    Did it involve swelling of the face/tongue/throat, SOB, or low BP? No Did it involve sudden or severe rash/hives, skin peeling, or any reaction on the inside of your mouth or nose? No Did you need to seek medical attention at a hospital or doctor's office? No When did it last happen?20 years If all above answers are "NO", may proceed with cephalosporin use.     Home Medications  Prior to Admission medications   Medication Sig Start Date End Date Taking? Authorizing Provider  ePHEDrine-guaiFENesin (BRONKAID) 25-400 MG TABS Take 1 tablet by mouth as needed (shortness of breath).    Yes [provider]  cephALEXin (KEFLEX) 500 MG capsule Take 1 capsule (500 mg total) by mouth 2  (two) times daily. Patient not taking: Reported on 07/29/2018 04/29/14   Clayton Bibles, PA-C  diphenhydrAMINE (BENADRYL) 25 MG tablet Take 1 tablet (25 mg total) by mouth every 6 (six) hours as needed for itching (Rash). Patient not taking: Reported on 07/29/2018 08/19/13   Piepenbrink, Anderson Malta, PA-C  HYDROcodone-acetaminophen (HYCET) 7.5-325 mg/15 ml solution Take 10 mLs by mouth 4 (four) times daily as needed for moderate pain or severe pain. Patient not taking: Reported on 07/29/2018 04/29/14   Clayton Bibles, PA-C  ibuprofen (ADVIL,MOTRIN) 800 MG tablet Take 1 tablet (800 mg total) by mouth every 8 (eight) hours as  needed for mild pain or moderate pain. Patient not taking: Reported on 07/29/2018 04/29/14   Clayton Bibles, PA-C  predniSONE (DELTASONE) 20 MG tablet Take 2 tablets (40 mg total) by mouth daily. Patient not taking: Reported on 07/29/2018 08/19/13   Baron Sane, PA-C    Rush Farmer, M.D. [facility] Pulmonary/Critical [facility] Medicine. Pager: [phone]. After hours pager: [phone].

## 2018-07-30 NOTE — Progress Notes (Signed)
Progress Note    Marissa [name]  DVV:[id number] DOB: [birth year]  DOA: 07/29/2018 PCP: Patient, No Pcp Per    Brief Narrative:     Medical records reviewed and are as summarized below:  Marissa [name] is a 53 y.o. female with medical history significant of asthma and tobacco abuse; who presents with complaints of progressively worsening shortness of breath over the last week.  CT scan showed mediastinal lymphadenopathy.  Assessment/Plan:   Principal Problem:   Respiratory failure with hypoxia (HCC) Active Problems:   Elevated d-dimer   Headache   Microcytic anemia   Tobacco abuse  Acute respiratory failure secondary with findings of mediastinal lymphadenopathy on CT Scan worrisome for small cell - Found hypoxic on room air to the 80s with improvement on 5 L of nasal cannula oxygen. -Follow-up sputum and blood cultures -Scheduled albuterol and Atrovent  -Empiric antibiotics of Rocephin -Mucinex  -PCCM consult for consideration of biopsy  Headache: Patient reports complaints of continued headaches.  Suspect could be related to patient's sinus congestion. 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  Patient with low MCV and MCH.  Denies any reports of bleeding or dark stools. -IV Fe x1  Tobacco abuse: Patient reports smoking at least 1/2 pack cigarettes per day for approximately 39 years. -Nicotine patch -gum  obesity Body mass index is 34.31 kg/m.   Family Communication/Anticipated D/C date and plan/Code Status   DVT prophylaxis: Lovenox ordered. Code Status: Full Code.  Family Communication:  Disposition Plan:    Medical Consultants:    Pulm  Subjective:   C/o headache  Objective:    Vitals:   07/29/18 2111 07/30/18 0501 07/30/18 0826 07/30/18 0827  BP: 136/62 135/60    Pulse: 94 85 91   Resp: 20 20 20    Temp: 98.6 F (37 C) 98 F (36.7 C)      TempSrc:  Oral    SpO2: (!) 86% 90% 94% 94%  Weight:      Height:        Intake/Output Summary (Last 24 hours) at 07/30/2018 1248 Last data filed at 07/30/2018 0308 Gross per 24 hour  Intake 100 ml  Output --  Net 100 ml   Filed Weights   07/29/18 1108 07/29/18 1629  Weight: 82.6 kg 85.1 kg    Exam: In bed, NAD rrr rhoncus breath sounds, no wheezing Min LE edema A+OX3  Data Reviewed:   I have personally reviewed following labs and imaging studies:  Labs: Labs show the following:   Basic Metabolic Panel: Recent Labs  Lab 07/29/18 1139 07/30/18 0247  NA 141 143  K 3.8 4.4  CL 105 106  CO2 25 29  GLUCOSE 81 135*  BUN 6 12  CREATININE 0.45 0.47  CALCIUM 8.8* 9.3   GFR Estimated Creatinine Clearance: 83.2 mL/min (by C-G formula based on SCr of 0.47 mg/dL). Liver Function Tests: Recent Labs  Lab 07/29/18 1139  AST 36  ALT 34  ALKPHOS 94  BILITOT 0.3  PROT 7.1  ALBUMIN 3.3*   No results for input(s): LIPASE, AMYLASE in the last 168 hours. No results for input(s): AMMONIA in the last 168 hours. Coagulation profile No results for input(s): INR, PROTIME in the last 168 hours.  CBC: Recent Labs  Lab 07/29/18 1139 07/30/18 0247  WBC 6.4 9.5  NEUTROABS 4.1  --   HGB 9.4* 9.3*  HCT 36.1 35.4*  MCV 77.1* 77.1*  PLT 476* 447*   Cardiac Enzymes: No results for input(s): CKTOTAL, CKMB, CKMBINDEX, TROPONINI in the last 168 hours. BNP (last 3 results) No results for input(s): PROBNP in the last 8760 hours. CBG: No results for input(s): GLUCAP in the last 168 hours. D-Dimer: Recent Labs    07/29/18 1139  DDIMER 0.75*   Hgb A1c: No results for input(s): HGBA1C in the last 72 hours. Lipid Profile: Recent Labs    07/29/18 1139  TRIG 75   Thyroid function studies: No results for input(s): TSH, T4TOTAL, T3FREE, THYROIDAB in the last 72 hours.  Invalid input(s): FREET3 Anemia work up: Recent Labs    07/29/18 1139 07/30/18 0247  FERRITIN 6*  6*  TIBC  --  605*  IRON  --  11*   Sepsis Labs: Recent Labs  Lab 07/29/18 1139 07/29/18 1719 07/30/18 0247  PROCALCITON <0.10  --   --   WBC 6.4  --  9.5  LATICACIDVEN 0.6 0.7  --     Microbiology Recent Results (from the past 240 hour(s))  Blood Culture (routine x 2)     Status: None (Preliminary result)   Collection Time: 07/29/18 11:50 AM   Specimen: BLOOD RIGHT HAND  Result Value Ref Range Status   Specimen Description BLOOD RIGHT HAND  Final   Special Requests   Final    BOTTLES DRAWN AEROBIC AND ANAEROBIC Blood Culture adequate volume   Culture   Final    NO GROWTH < 24 HOURS Performed at [facility], [place] [address] [place], [place] [address]    Report Status PENDING  Incomplete  SARS Coronavirus 2 [facility] order, Performed in [facility] hospital lab)     Status: None   Collection Time: 07/29/18 11:52 AM   Specimen: Nasopharyngeal Swab  Result Value Ref Range Status   SARS Coronavirus 2 NEGATIVE NEGATIVE Final    Comment: (NOTE) If result is NEGATIVE SARS-CoV-2 target nucleic acids are NOT DETECTED. The SARS-CoV-2 RNA is generally detectable in upper and lower  respiratory specimens during the acute phase of infection. The lowest  concentration of SARS-CoV-2 viral copies this assay can detect is 250  copies / mL. A negative result does not preclude SARS-CoV-2 infection  and should not be used as the sole basis for treatment or other  patient management decisions.  A negative result may occur with  improper specimen collection / handling, submission of specimen other  than nasopharyngeal swab, presence of viral mutation(s) within the  areas targeted by this assay, and inadequate number of viral copies  (<250 copies / mL). A negative result must be combined with clinical  observations, patient history, and epidemiological information. If result is POSITIVE SARS-CoV-2 target nucleic acids are DETECTED. The SARS-CoV-2 RNA is generally detectable in upper  and lower  respiratory specimens dur ing the acute phase of infection.  Positive  results are indicative of active infection with SARS-CoV-2.  Clinical  correlation with patient history and other diagnostic information is  necessary to determine patient infection status.  Positive results do  not rule out bacterial infection or co-infection with other viruses. If result is PRESUMPTIVE POSTIVE SARS-CoV-2 nucleic acids MAY BE PRESENT.   A presumptive positive result was obtained on the submitted specimen  and confirmed on repeat testing.  While 2019 novel coronavirus  (SARS-CoV-2) nucleic acids may be present in the submitted sample  additional confirmatory testing may be necessary for epidemiological  and / or clinical management purposes  to differentiate between  SARS-CoV-2 and other Sarbecovirus currently known to infect humans.  If clinically indicated additional testing with an alternate test  methodology [phone]) is advised. The SARS-CoV-2 RNA is generally  detectable in upper and lower respiratory sp ecimens during the acute  phase of infection. The expected result is Negative. Fact Sheet for Patients:  StrictlyIdeas.no Fact Sheet for Healthcare Providers: BankingDealers.co.za This test is not yet approved or cleared by the [place] FDA and has been authorized for detection and/or diagnosis of SARS-CoV-2 by FDA under an Emergency Use Authorization (EUA).  This EUA will remain in effect (meaning this test can be used) for the duration of the COVID-19 declaration under Section 564(b)(1) of the Act, 21 U.S.C. section 360bbb-3(b)(1), unless the authorization is terminated or revoked sooner. Performed at [facility], [place] [address] [place], [place] [address]   Blood Culture (routine x 2)     Status: None (Preliminary result)   Collection Time: 07/29/18 12:10 PM   Specimen: BLOOD  Result Value Ref Range Status    Specimen Description BLOOD LEFT ANTECUBITAL  Final   Special Requests   Final    BOTTLES DRAWN AEROBIC AND ANAEROBIC Blood Culture adequate volume   Culture   Final    NO GROWTH < 24 HOURS Performed at [facility], [place] [address], [place], [place] [address]    Report Status PENDING  Incomplete  Respiratory Panel by PCR     Status: None   Collection Time: 07/29/18  5:19 PM   Specimen: Nasopharyngeal Swab; Respiratory  Result Value Ref Range Status   Adenovirus NOT DETECTED NOT DETECTED Final   Coronavirus 229E NOT DETECTED NOT DETECTED Final    Comment: (NOTE) The Coronavirus on the Respiratory Panel, DOES NOT test for the novel  Coronavirus (2019 nCoV)    Coronavirus HKU1 NOT DETECTED NOT DETECTED Final   Coronavirus NL63 NOT DETECTED NOT DETECTED Final   Coronavirus OC43 NOT DETECTED NOT DETECTED Final   Metapneumovirus NOT DETECTED NOT DETECTED Final   Rhinovirus / Enterovirus NOT DETECTED NOT DETECTED Final   Influenza A NOT DETECTED NOT DETECTED Final   Influenza B NOT DETECTED NOT DETECTED Final   Parainfluenza Virus 1 NOT DETECTED NOT DETECTED Final   Parainfluenza Virus 2 NOT DETECTED NOT DETECTED Final   Parainfluenza Virus 3 NOT DETECTED NOT DETECTED Final   Parainfluenza Virus 4 NOT DETECTED NOT DETECTED Final   Respiratory Syncytial Virus NOT DETECTED NOT DETECTED Final   Bordetella pertussis NOT DETECTED NOT DETECTED Final   Chlamydophila pneumoniae NOT DETECTED NOT DETECTED Final   Mycoplasma pneumoniae NOT DETECTED NOT DETECTED Final    Comment: Performed at [facility], [place]. [address], [place], [place] [address]  Group A Strep by PCR     Status: None   Collection Time: 07/29/18  5:19 PM   Specimen: Nasopharyngeal Swab; Sterile Swab  Result Value Ref Range Status   Group A Strep by PCR NOT DETECTED NOT DETECTED Final    Comment: Performed at [facility], [place] [address], [place], [place] [address]    Procedures and diagnostic studies:  Ct  Angio Chest Pe W Or Wo Contrast  Result Date: 07/29/2018 CLINICAL DATA:  53 year old female with chest pain and shortness of breath. Abnormal D-dimer. EXAM: CT ANGIOGRAPHY CHEST WITH CONTRAST TECHNIQUE: Multidetector CT imaging of the chest was performed using the standard protocol during bolus administration of intravenous contrast. Multiplanar CT image reconstructions and MIPs were  obtained to evaluate the vascular anatomy. CONTRAST:  175mL OMNIPAQUE IOHEXOL 350 MG/ML SOLN COMPARISON:  Portable chest earlier today. FINDINGS: Cardiovascular: Good contrast bolus timing in the pulmonary arterial tree. Mild respiratory motion. No focal filling defect identified in the pulmonary arteries to suggest acute pulmonary embolism. Cardiomegaly.  No pericardial effusion.  Negative visible aorta. Mediastinum/Nodes: Mediastinal and left hilar malignant appearing lymphadenopathy. Prevascular abnormal nodes individually up to 27 millimeter short axis. Soft tissue encasing the left hilum (series 5, image 46). Right hilar lymph nodes remain normal. No lymphadenopathy identified in the visible lower neck. No axillary lymphadenopathy. Lungs/Pleura: Low lung volumes with diffuse crowding of markings and atelectasis. Mass effect on the airways at the left hilum, but the major airways remain patent. There is a small spiculated nodule in the lingula measuring about 9 millimeters on series 6, image 65. Otherwise only vague sub solid nodules are occasionally identified in the left lung. Some paraseptal emphysema in the contralateral right lung. Mild linear atelectasis or scarring in the lateral segment of the right middle lobe. No pleural effusion. Upper Abdomen: Negative visible liver, gallbladder, spleen, pancreas, stomach and kidneys. There is an oval 33 millimeter left adrenal nodule. This has low-density (4-5 Hounsfield units) but is indeterminate in this clinical setting. 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 No acute or suspicious osseous lesion. Review of the MIP images confirms the above findings. IMPRESSION: 1. Malignant appearing left mediastinal and hilar lymphadenopathy narrowing the airways at the left hilum. In the left lung only a subtle 9 mm spiculated nodule is identified in the lingula. Consider small cell carcinoma. Bronchoscopy or mediastinal node sampling might be most valuable for diagnosis. 2. Indeterminate although low-density (which typically which indicates a benign adenoma) left adrenal nodule. 3.  Negative for acute pulmonary embolus. Electronically Signed   By: Genevie Ann M.D.   On: 07/29/2018 20:05   Dg Chest Port 1 View  Result Date: 07/29/2018 CLINICAL DATA:  Shortness of breath, cough EXAM: PORTABLE CHEST 1 VIEW COMPARISON:  03/06/2013 FINDINGS: There is no focal consolidation. There is no pleural effusion or pneumothorax. The cardiac silhouette is relatively enlarged which may be projectional given the AP view. There is no acute osseous abnormality. IMPRESSION: No active disease. Electronically Signed   By: Kathreen Devoid   On: 07/29/2018 12:12    Medications:    albuterol  2.5 mg Nebulization TID   enoxaparin (LOVENOX) injection  40 mg Subcutaneous Q24H   guaiFENesin  600 mg Oral BID   ipratropium  2 puff Inhalation TID   loratadine  10 mg Oral Daily   mouth rinse  15 mL Mouth Rinse BID   nicotine  21 mg Transdermal Daily   pantoprazole  40 mg Oral Daily   predniSONE  40 mg Oral Q breakfast   sodium chloride flush  3 mL Intravenous Q12H   Continuous Infusions:  cefTRIAXone (ROCEPHIN)  IV 1 g (07/29/18 1749)     LOS: 0 days   Geradine Girt  [facility]   How to contact the [facility] Attending or Consulting provider [place] or covering provider during after hours [place], for this patient?  1. Check the care team in [facility] and look for a) attending/consulting [facility] provider listed and b) the [facility] team listed 2. Log into [email or web address]  and use [place]'s universal password to access. If you do not have the password, please contact the hospital operator. 3. Locate the [facility]  provider you are looking for under [facility] and page to a number that you can be directly reached. 4. If you still have difficulty reaching the provider, please page the [facility] (Director on Call) for the Hospitalists listed on amion for assistance.  07/30/2018, 12:48 PM

## 2018-07-30 NOTE — Progress Notes (Signed)
NCM made aware of DME  home oxygen need. Pt doesn't have health insurance, jobless. NCM made referral with [employer]/ [name] [phone]) for DME home oxygen, charity case. Awaiting approval. Whitman Hero RN, BSN,CM

## 2018-07-30 NOTE — Plan of Care (Signed)
  Problem: Education: Goal: Knowledge of General Education information will improve Description: Including pain rating scale, medication(s)/side effects and non-pharmacologic comfort measures Outcome: Progressing   Problem: Health Behavior/Discharge Planning: Goal: Ability to manage health-related needs will improve Outcome: Progressing   Problem: Clinical Measurements: Goal: Ability to maintain clinical measurements within normal limits will improve Outcome: Progressing Goal: Will remain free from infection Outcome: Progressing Goal: Diagnostic test results will improve Outcome: Progressing Goal: Respiratory complications will improve Outcome: Progressing Goal: Cardiovascular complication will be avoided Outcome: Progressing   Problem: Activity: Goal: Risk for activity intolerance will decrease Outcome: Progressing   Problem: Nutrition: Goal: Adequate nutrition will be maintained Outcome: Progressing   Problem: Coping: Goal: Level of anxiety will decrease Outcome: Progressing   Problem: Elimination: Goal: Will not experience complications related to bowel motility Outcome: Progressing Goal: Will not experience complications related to urinary retention Outcome: Progressing   Problem: Pain Managment: Goal: General experience of comfort will improve Outcome: Progressing   Problem: Safety: Goal: Ability to remain free from injury will improve Outcome: Progressing   Problem: Skin Integrity: Goal: Risk for impaired skin integrity will decrease Outcome: Progressing   Problem: Education: Goal: Knowledge of disease or condition will improve Outcome: Progressing Goal: Knowledge of the prescribed therapeutic regimen will improve Outcome: Progressing Goal: Individualized Educational Video(s) Outcome: Progressing   Problem: Activity: Goal: Ability to tolerate increased activity will improve Outcome: Not Progressing Pt O2 sats drop with exertion Problem:  Respiratory: Goal: Ability to maintain a clear airway will improve Outcome: Progressing Goal: Levels of oxygenation will improve Outcome: Not Progressing Pt has not been able to be weaned off of O2 Goal: Ability to maintain adequate ventilation will improve Outcome: Progressing   Goal: Will verbalize the importance of balancing activity with adequate rest periods Outcome: Progressing

## 2018-07-31 LAB — HIV ANTIBODY (ROUTINE TESTING W REFLEX): HIV Screen 4th Generation wRfx: NONREACTIVE

## 2018-07-31 MED ORDER — DOXYCYCLINE HYCLATE 100 MG PO TABS: 100.0000 mg | ORAL_TABLET | Freq: Two times a day (BID) | ORAL | 0 refills | Status: DC

## 2018-07-31 MED ORDER — IPRATROPIUM-ALBUTEROL 0.5-2.5 (3) MG/3ML IN SOLN: 3.0000 mL | Freq: Three times a day (TID) | RESPIRATORY_TRACT | 0 refills | Status: DC

## 2018-07-31 MED ORDER — PREDNISONE 10 MG PO TABS: ORAL_TABLET | ORAL | 0 refills | Status: DC

## 2018-07-31 MED ORDER — NICOTINE 21 MG/24HR TD PT24: 21.0000 mg | MEDICATED_PATCH | Freq: Every day | TRANSDERMAL | 0 refills | Status: DC

## 2018-07-31 MED ORDER — BUTALBITAL-APAP-CAFFEINE 50-325-40 MG PO TABS: 1.0000 | ORAL_TABLET | Freq: Four times a day (QID) | ORAL | 0 refills | Status: DC | PRN

## 2018-07-31 NOTE — Progress Notes (Signed)
Received referral to assist pt with a nebulizer machine and prescriptions. Contacted [name] at [place] [facility] for nebulizer referral. Spoke to pt and she stated that she needs assistance with her new prescriptions. Provided pt with a [place] letter.

## 2018-07-31 NOTE — Discharge Summary (Signed)
Physician Discharge Summary  Marissa [name] YWV:[id number] DOB: 07-Nov-1965 DOA: 07/29/2018  PCP: Patient, No Pcp Per  Admit date: 07/29/2018 Discharge date: 07/31/2018  Admitted From: home Discharge disposition: home   Recommendations for Outpatient Follow-Up:   1. pulmonary follow up for EBUS 2. Sent home on O2 3. Referral made for PCP at [facility] 4. Smoking cessation!! 5. Outpatient PFTs   Discharge Diagnosis:   Principal Problem:   Respiratory failure with hypoxia (HCC) Active Problems:   Elevated d-dimer   Headache   Microcytic anemia   Tobacco abuse    Discharge Condition: Improved.  Diet recommendation: Low sodium, heart healthy Wound care: None.  Code status: Full.   History of Present Illness:   Marissa [name] is a 53 y.o. female with medical history significant of asthma and tobacco abuse; who presents with complaints of progressively worsening shortness of breath over the last week.  She reports being unable to sleep at night due to feeling as though she cannot breathe.  Reports having pleuritic-like chest pain with taking a deep breath in on her left side and in her back.  Complains of a productive cough with intermittent greenish to clear sputum production.  Coughing seems to worsen pain symptoms.  She has been using over-the-counter medications of Bronkaid as she ran out of her inhaler with some help of breaking up the congestion.  Associated symptoms include complaints of throbbing headache, low back pain, sore throat, ear discomfort especially on the left, posttussive emesis x2, allergies, and generalized malaise.  For the headache she has been taking BC Goody powders.  Where she is currently living has a hole in the roof where it leaks inside the home every time that it rains, and wash machine overflows where there is standing water.  She notes that there is black which she thinks is mold in the home and may be worsening her breathing.   Denies having any fever, abdominal pain, dysuria, or diarrhea symptoms.  She admits to smoking at least 1 and half packs of cigarettes per day on average since the age of 49.   Hospital Course by Problem:   SOB: likely COPD             - Need PFTs as outpatient             - F/U in the pulmonary clinic             - PO prednisone with quick taper             - PO doxy  Hypoxemia:             - < 88 when ambulatory  -Home O2 arranged  Mediastinal LAN:             - Dr. Tamala Julian to arrange for EBUS next week             Tobacco abuse:             - Smoking cessation recommended  -nicotine patch prescribed    Medical Consultants:    PCCM  Discharge Exam:   Vitals:   07/31/18 0532 07/31/18 0745  BP: 132/69   Pulse: 84   Resp: (!) 22   Temp: 98.2 F (36.8 C)   SpO2: 93% 95%   Vitals:   07/30/18 2140 07/30/18 2220 07/31/18 0532 07/31/18 0745  BP: 134/63  132/69   Pulse: 91  84   Resp: (!) 22  (!)  22   Temp: 98.2 F (36.8 C)  98.2 F (36.8 C)   TempSrc:      SpO2: (!) 83% 93% 93% 95%  Weight:      Height:        General exam: Appears calm and comfortable.     The results of significant diagnostics from this hospitalization (including imaging, microbiology, ancillary and laboratory) are listed below for reference.     Procedures and Diagnostic Studies:   Ct Angio Chest Pe W Or Wo Contrast  Result Date: 07/29/2018 CLINICAL DATA:  53 year old female with chest pain and shortness of breath. Abnormal D-dimer. EXAM: CT ANGIOGRAPHY CHEST WITH CONTRAST TECHNIQUE: Multidetector CT imaging of the chest was performed using the standard protocol during bolus administration of intravenous contrast. Multiplanar CT image reconstructions and MIPs were obtained to evaluate the vascular anatomy. CONTRAST:  129mL OMNIPAQUE IOHEXOL 350 MG/ML SOLN COMPARISON:  Portable chest earlier today. FINDINGS: Cardiovascular: Good contrast bolus timing in the pulmonary arterial tree. Mild  respiratory motion. No focal filling defect identified in the pulmonary arteries to suggest acute pulmonary embolism. Cardiomegaly.  No pericardial effusion.  Negative visible aorta. Mediastinum/Nodes: Mediastinal and left hilar malignant appearing lymphadenopathy. Prevascular abnormal nodes individually up to 27 millimeter short axis. Soft tissue encasing the left hilum (series 5, image 46). Right hilar lymph nodes remain normal. No lymphadenopathy identified in the visible lower neck. No axillary lymphadenopathy. Lungs/Pleura: Low lung volumes with diffuse crowding of markings and atelectasis. Mass effect on the airways at the left hilum, but the major airways remain patent. There is a small spiculated nodule in the lingula measuring about 9 millimeters on series 6, image 65. Otherwise only vague sub solid nodules are occasionally identified in the left lung. Some paraseptal emphysema in the contralateral right lung. Mild linear atelectasis or scarring in the lateral segment of the right middle lobe. No pleural effusion. Upper Abdomen: Negative visible liver, gallbladder, spleen, pancreas, stomach and kidneys. There is an oval 33 millimeter left adrenal nodule. This has low-density (4-5 Hounsfield units) but is indeterminate in this clinical setting. Musculoskeletal: Chronic or congenital fusion of the right lateral 1st and 2nd ribs. No acute or suspicious osseous lesion. Review of the MIP images confirms the above findings. IMPRESSION: 1. Malignant appearing left mediastinal and hilar lymphadenopathy narrowing the airways at the left hilum. In the left lung only a subtle 9 mm spiculated nodule is identified in the lingula. Consider small cell carcinoma. Bronchoscopy or mediastinal node sampling might be most valuable for diagnosis. 2. Indeterminate although low-density (which typically which indicates a benign adenoma) left adrenal nodule. 3.  Negative for acute pulmonary embolus. Electronically Signed   By: Genevie Ann M.D.   On: 07/29/2018 20:05   Dg Chest Port 1 View  Result Date: 07/29/2018 CLINICAL DATA:  Shortness of breath, cough EXAM: PORTABLE CHEST 1 VIEW COMPARISON:  03/06/2013 FINDINGS: There is no focal consolidation. There is no pleural effusion or pneumothorax. The cardiac silhouette is relatively enlarged which may be projectional given the AP view. There is no acute osseous abnormality. IMPRESSION: No active disease. Electronically Signed   By: Kathreen Devoid   On: 07/29/2018 12:12     Labs:   Basic Metabolic Panel: Recent Labs  Lab 07/29/18 1139 07/30/18 0247  NA 141 143  K 3.8 4.4  CL 105 106  CO2 25 29  GLUCOSE 81 135*  BUN 6 12  CREATININE 0.45 0.47  CALCIUM 8.8* 9.3   GFR Estimated Creatinine Clearance:  83.2 mL/min (by C-G formula based on SCr of 0.47 mg/dL). Liver Function Tests: Recent Labs  Lab 07/29/18 1139  AST 36  ALT 34  ALKPHOS 94  BILITOT 0.3  PROT 7.1  ALBUMIN 3.3*   No results for input(s): LIPASE, AMYLASE in the last 168 hours. No results for input(s): AMMONIA in the last 168 hours. Coagulation profile No results for input(s): INR, PROTIME in the last 168 hours.  CBC: Recent Labs  Lab 07/29/18 1139 07/30/18 0247  WBC 6.4 9.5  NEUTROABS 4.1  --   HGB 9.4* 9.3*  HCT 36.1 35.4*  MCV 77.1* 77.1*  PLT 476* 447*   Cardiac Enzymes: No results for input(s): CKTOTAL, CKMB, CKMBINDEX, TROPONINI in the last 168 hours. BNP: Invalid input(s): POCBNP CBG: No results for input(s): GLUCAP in the last 168 hours. D-Dimer Recent Labs    07/29/18 1139  DDIMER 0.75*   Hgb A1c No results for input(s): HGBA1C in the last 72 hours. Lipid Profile Recent Labs    07/29/18 1139  TRIG 75   Thyroid function studies No results for input(s): TSH, T4TOTAL, T3FREE, THYROIDAB in the last 72 hours.  Invalid input(s): FREET3 Anemia work up Recent Labs    07/29/18 1139 07/30/18 0247  FERRITIN 6* 6*  TIBC  --  605*  IRON  --  11*    Microbiology Recent Results (from the past 240 hour(s))  Blood Culture (routine x 2)     Status: None (Preliminary result)   Collection Time: 07/29/18 11:50 AM   Specimen: BLOOD RIGHT HAND  Result Value Ref Range Status   Specimen Description BLOOD RIGHT HAND  Final   Special Requests   Final    BOTTLES DRAWN AEROBIC AND ANAEROBIC Blood Culture adequate volume   Culture   Final    NO GROWTH < 24 HOURS Performed at [facility], [place] [address], [place], [place] [address]    Report Status PENDING  Incomplete  SARS Coronavirus 2 [facility] order, Performed in [place] hospital lab)     Status: None   Collection Time: 07/29/18 11:52 AM   Specimen: Nasopharyngeal Swab  Result Value Ref Range Status   SARS Coronavirus 2 NEGATIVE NEGATIVE Final    Comment: (NOTE) If result is NEGATIVE SARS-CoV-2 target nucleic acids are NOT DETECTED. The SARS-CoV-2 RNA is generally detectable in upper and lower  respiratory specimens during the acute phase of infection. The lowest  concentration of SARS-CoV-2 viral copies this assay can detect is 250  copies / mL. A negative result does not preclude SARS-CoV-2 infection  and should not be used as the sole basis for treatment or other  patient management decisions.  A negative result may occur with  improper specimen collection / handling, submission of specimen other  than nasopharyngeal swab, presence of viral mutation(s) within the  areas targeted by this assay, and inadequate number of viral copies  (<250 copies / mL). A negative result must be combined with clinical  observations, patient history, and epidemiological information. If result is POSITIVE SARS-CoV-2 target nucleic acids are DETECTED. The SARS-CoV-2 RNA is generally detectable in upper and lower  respiratory specimens dur ing the acute phase of infection.  Positive  results are indicative of active infection with SARS-CoV-2.  Clinical  correlation with patient history and  other diagnostic information is  necessary to determine patient infection status.  Positive results do  not rule out bacterial infection or co-infection with other viruses. If result is PRESUMPTIVE POSTIVE SARS-CoV-2  nucleic acids MAY BE PRESENT.   A presumptive positive result was obtained on the submitted specimen  and confirmed on repeat testing.  While 2019 novel coronavirus  (SARS-CoV-2) nucleic acids may be present in the submitted sample  additional confirmatory testing may be necessary for epidemiological  and / or clinical management purposes  to differentiate between  SARS-CoV-2 and other Sarbecovirus currently known to infect humans.  If clinically indicated additional testing with an alternate test  methodology [phone]) is advised. The SARS-CoV-2 RNA is generally  detectable in upper and lower respiratory sp ecimens during the acute  phase of infection. The expected result is Negative. Fact Sheet for Patients:  StrictlyIdeas.no Fact Sheet for Healthcare Providers: BankingDealers.co.za This test is not yet approved or cleared by the [place] FDA and has been authorized for detection and/or diagnosis of SARS-CoV-2 by FDA under an Emergency Use Authorization (EUA).  This EUA will remain in effect (meaning this test can be used) for the duration of the COVID-19 declaration under Section 564(b)(1) of the Act, 21 U.S.C. section 360bbb-3(b)(1), unless the authorization is terminated or revoked sooner. Performed at [facility], [place] [address], [place], [place] [address]   Blood Culture (routine x 2)     Status: None (Preliminary result)   Collection Time: 07/29/18 12:10 PM   Specimen: BLOOD  Result Value Ref Range Status   Specimen Description BLOOD LEFT ANTECUBITAL  Final   Special Requests   Final    BOTTLES DRAWN AEROBIC AND ANAEROBIC Blood Culture adequate volume   Culture   Final    NO GROWTH < 24  HOURS Performed at [facility], [place] [address], [place], [place] [address]    Report Status PENDING  Incomplete  Respiratory Panel by PCR     Status: None   Collection Time: 07/29/18  5:19 PM   Specimen: Nasopharyngeal Swab; Respiratory  Result Value Ref Range Status   Adenovirus NOT DETECTED NOT DETECTED Final   Coronavirus 229E NOT DETECTED NOT DETECTED Final    Comment: (NOTE) The Coronavirus on the Respiratory Panel, DOES NOT test for the novel  Coronavirus (2019 nCoV)    Coronavirus HKU1 NOT DETECTED NOT DETECTED Final   Coronavirus NL63 NOT DETECTED NOT DETECTED Final   Coronavirus OC43 NOT DETECTED NOT DETECTED Final   Metapneumovirus NOT DETECTED NOT DETECTED Final   Rhinovirus / Enterovirus NOT DETECTED NOT DETECTED Final   Influenza A NOT DETECTED NOT DETECTED Final   Influenza B NOT DETECTED NOT DETECTED Final   Parainfluenza Virus 1 NOT DETECTED NOT DETECTED Final   Parainfluenza Virus 2 NOT DETECTED NOT DETECTED Final   Parainfluenza Virus 3 NOT DETECTED NOT DETECTED Final   Parainfluenza Virus 4 NOT DETECTED NOT DETECTED Final   Respiratory Syncytial Virus NOT DETECTED NOT DETECTED Final   Bordetella pertussis NOT DETECTED NOT DETECTED Final   Chlamydophila pneumoniae NOT DETECTED NOT DETECTED Final   Mycoplasma pneumoniae NOT DETECTED NOT DETECTED Final    Comment: Performed at [facility], [place] [address] [place], [place] [address]  Group A Strep by PCR     Status: None   Collection Time: 07/29/18  5:19 PM   Specimen: Nasopharyngeal Swab; Sterile Swab  Result Value Ref Range Status   Group A Strep by PCR NOT DETECTED NOT DETECTED Final    Comment: Performed at [facility], [place] [address], [place], [place] [address]     Discharge Instructions:   Discharge Instructions  Diet general   Complete by: As directed    Discharge instructions   Complete by: As directed    Stop smoking Will need outpatient PFTs You will be called to set up  your biopsy of the lung   For home use only DME Nebulizer machine   Complete by: As directed    Patient needs a nebulizer to treat with the following condition: COPD (chronic obstructive pulmonary disease) ([place])   Length of Need: Lifetime   Increase activity slowly   Complete by: As directed      Allergies as of 07/31/2018      Reactions   Penicillins Nausea Only   Did it involve swelling of the face/tongue/throat, SOB, or low BP? No Did it involve sudden or severe rash/hives, skin peeling, or any reaction on the inside of your mouth or nose? No Did you need to seek medical attention at a hospital or doctor's office? No When did it last happen?20 years If all above answers are NO, may proceed with cephalosporin use.      Medication List    STOP taking these medications   Bronkaid 25-400 MG Tabs Generic drug: ePHEDrine-guaiFENesin   cephALEXin 500 MG capsule Commonly known as: KEFLEX   diphenhydrAMINE 25 MG tablet Commonly known as: Benadryl   HYDROcodone-acetaminophen 7.5-325 mg/15 ml solution Commonly known as: HYCET   ibuprofen 800 MG tablet Commonly known as: ADVIL     TAKE these medications   butalbital-acetaminophen-caffeine 50-325-40 MG tablet Commonly known as: FIORICET Take 1 tablet by mouth every 6 (six) hours as needed for headache.   doxycycline 100 MG tablet Commonly known as: VIBRA-TABS Take 1 tablet (100 mg total) by mouth every 12 (twelve) hours.   ipratropium-albuterol 0.5-2.5 (3) MG/3ML Soln Commonly known as: DUONEB Take 3 mLs by nebulization 3 (three) times daily.   nicotine 21 mg/24hr patch Commonly known as: NICODERM CQ - dosed in mg/24 hours Place 1 patch (21 mg total) onto the skin daily.   predniSONE 10 MG tablet Commonly known as: DELTASONE 40 mg x 2 days then 30 mg x 2 days then 20 mg x 2 days then 10 mg x 2 days then stop What changed:   medication strength  how much to take  how to take this  when to take  this  additional instructions            Durable Medical Equipment  (From admission, onward)         Start     Ordered   07/31/18 0000  For home use only DME Nebulizer machine    Question Answer Comment  Patient needs a nebulizer to treat with the following condition COPD (chronic obstructive pulmonary disease) ([place])   Length of Need Lifetime      07/31/18 0850   07/30/18 1359  For home use only DME oxygen  Once    Question Answer Comment  Length of Need 6 Months   Mode or (Route) Nasal cannula   Liters per Minute 3   Oxygen delivery system Gas      07/30/18 1358         Follow-up Information    [facility] Follow up.   Contact information: [place] [address] [place], [place], DO Follow up.   Specialty: Internal Medicine Contact information: [address] [place] [address] [phone]  Time coordinating discharge: 35 min  Signed:  Geradine Girt DO  [facility] 07/31/2018, 8:56 AM

## 2018-07-31 NOTE — Progress Notes (Signed)
Spoke with CM Jeannette about pt discharging needs, O2 tank was delivered yesterday, but per MD order today pt needs nebulizer machine delivered to her room and case manager see her today then she could go home.   Note CM Angela's note from 7/3, that pt will need some kind of assistance with medications, discussed also with Jeannette,CM. She will be seeing pt and keep me informed when pt is ready for discharge.

## 2018-07-31 NOTE — Progress Notes (Deleted)
Occupational Therapy Treatment Patient Details Name: Marissa [name] MRN: [id number] DOB: 04/18/1965 Today's Date: 07/31/2018    History of present illness Admitted with Acute respiratory failure secondary to asthma/COPD exacerbation   OT comments  Pt progressing toward OT goals with no complaints of pain during session. Pt tolerated session well, however, demonstrated desat O2 during functional activity to toilet from 93% initially to 83%. Pt educated of pursed lip technique and energy conservation techniques to ensure safe return to home setting. Pt required constant reminders to be mindful of O2 sat. No OT follow up required after d/c. OT will continue to follow acutely.    Follow Up Recommendations  No OT follow up    Equipment Recommendations  3 in 1 bedside commode    Recommendations for Other Services      Precautions / Restrictions Precautions Precautions: Other (comment) Precaution Comments: watch O2 sats Restrictions Weight Bearing Restrictions: No       Mobility Bed Mobility Overal bed mobility: Independent                Transfers Overall transfer level: Modified independent               General transfer comment: No gross difficulty    Balance Overall balance assessment: Independent                                         ADL either performed or assessed with clinical judgement   ADL Overall ADL's : Modified independent                                       General ADL Comments: increased time required for SOB and o2 status. support needed for safety.      Vision       Perception     Praxis      Cognition Arousal/Alertness: Awake/alert Behavior During Therapy: WFL for tasks assessed/performed Overall Cognitive Status: Within Functional Limits for tasks assessed                                          Exercises     Shoulder Instructions       General Comments desatted to 83%  on 3L O2 during funtional activity to toilet and increased to 90% after pursed lip technique. Pt educated of importance of energy conservation technique during functional tasks and safety with O2 in home environment.     Pertinent Vitals/ Pain       Pain Assessment: No/denies pain  Home Living                                          Prior Functioning/Environment              Frequency  Min 1X/week        Progress Toward Goals  OT Goals(current goals can now be found in the care plan section)  Progress towards OT goals: Progressing toward goals  Acute Rehab OT Goals Patient Stated Goal: Hopes to be home soon; hopes she will not need supplemental O2  Plan Discharge plan remains  appropriate;Frequency remains appropriate    Co-evaluation                 AM-PAC OT "6 Clicks" Daily Activity     Outcome Measure   Help from another person eating meals?: None Help from another person taking care of personal grooming?: None Help from another person toileting, which includes using toliet, bedpan, or urinal?: None Help from another person bathing (including washing, rinsing, drying)?: None Help from another person to put on and taking off regular upper body clothing?: None Help from another person to put on and taking off regular lower body clothing?: None 6 Click Score: 24    End of Session    OT Visit Diagnosis: Other (comment)(SOB, COPD)   Activity Tolerance Patient tolerated treatment well   Patient Left in chair;with call bell/phone within reach   Nurse Communication Mobility status        Time: 2072-1828 OT Time Calculation (min): 19 min  Charges: OT General Charges $OT Visit: 1 Visit OT Treatments $Self Care/Home Management : 8-22 mins  Minus Breeding, MSOT, OTR/L  Supplemental Rehabilitation Services  [phone]    Marius Ditch 07/31/2018, 11:23 AM

## 2018-07-31 NOTE — Progress Notes (Signed)
Occupational Therapy Evaluation Patient Details Name: Marissa [name] MRN: [id number] DOB: 1965-06-16 Today's Date: 07/31/2018    History of Present Illness Admitted with Acute respiratory failure secondary to asthma/COPD exacerbation   Clinical Impression   Pt presents with above diagnosis. PTA pt PLOF independent in all ADLs and IADLs. Pt tolerated session well, however, demonstrated desat O2 during functional activity to toilet from 93% initially to 83%. Pt educated of pursed lip technique and energy conservation techniques to ensure safe return to home setting. Pt required constant reminders to be mindful of O2 sat. No OT follow up required after d/c. OT will continue to follow acutely    Follow Up Recommendations  No OT follow up    Equipment Recommendations  3 in 1 bedside commode    Recommendations for Other Services       Precautions / Restrictions Precautions Precautions: Other (comment) Precaution Comments: watch O2 sats Restrictions Weight Bearing Restrictions: No      Mobility Bed Mobility Overal bed mobility: Independent                Transfers Overall transfer level: Modified independent               General transfer comment: No gross difficulty    Balance Overall balance assessment: Independent                                         ADL either performed or assessed with clinical judgement   ADL Overall ADL's : Modified independent                                       General ADL Comments: increased time required for SOB and o2 status. support needed for safety.      Vision         Perception     Praxis      Pertinent Vitals/Pain Pain Assessment: No/denies pain     Hand Dominance     Extremity/Trunk Assessment Upper Extremity Assessment Upper Extremity Assessment: Overall WFL for tasks assessed   Lower Extremity Assessment Lower Extremity Assessment: Defer to PT evaluation        Communication Communication Communication: No difficulties   Cognition Arousal/Alertness: Awake/alert Behavior During Therapy: WFL for tasks assessed/performed Overall Cognitive Status: Within Functional Limits for tasks assessed                                     General Comments  desatted to 83% on 3L O2 during funtional activity to toilet and increased to 90% after pursed lip technique. Pt educated of importance of energy conservation technique during functional tasks and safety with O2 in home environment.     Exercises     Shoulder Instructions      Home Living Family/patient expects to be discharged to:: Private residence Living Arrangements: Spouse/significant other;Other (Comment)(and roommate) Available Help at Discharge: Family;Available PRN/intermittently Type of Home: House Home Access: Stairs to enter Entrance Stairs-Number of Steps: 3   Home Layout: One level     Bathroom Shower/Tub: Tub/shower unit         Home Equipment: None          Prior Functioning/Environment Level of Independence: Independent  OT Problem List:        OT Treatment/Interventions:      OT Goals(Current goals can be found in the care plan section) Acute Rehab OT Goals Patient Stated Goal: Hopes to be home soon; hopes she will not need supplemental O2 OT Goal Formulation: With patient Time For Goal Achievement: 08/14/18 Potential to Achieve Goals: Fair  OT Frequency: Min 1X/week   Barriers to D/C:            Co-evaluation              AM-PAC OT "6 Clicks" Daily Activity     Outcome Measure Help from another person eating meals?: None Help from another person taking care of personal grooming?: None Help from another person toileting, which includes using toliet, bedpan, or urinal?: None Help from another person bathing (including washing, rinsing, drying)?: None Help from another person to put on and taking off regular upper  body clothing?: None Help from another person to put on and taking off regular lower body clothing?: None 6 Click Score: 24   End of Session Nurse Communication: Mobility status  Activity Tolerance: Patient tolerated treatment well Patient left: in chair;with call bell/phone within reach  OT Visit Diagnosis: Other (comment)(SOB, COPD)                Time: 6808-8110 OT Time Calculation (min): 19 min Charges:  OT General Charges $OT Visit: 1 Visit OT Evaluation $OT Eval Low Complexity: [place], MSOT, OTR/L  Supplemental Rehabilitation Services  [phone]   Marius Ditch 07/31/2018, 11:29 AM

## 2018-08-01 ENCOUNTER — Encounter: Payer: Self-pay | Admitting: Pulmonary Disease

## 2018-08-03 LAB — CULTURE, BLOOD (ROUTINE X 2)
Culture: NO GROWTH
Culture: NO GROWTH
Special Requests: ADEQUATE
Special Requests: ADEQUATE

## 2018-08-06 ENCOUNTER — Telehealth: Payer: Self-pay | Admitting: Pulmonary Disease

## 2018-08-06 NOTE — Telephone Encounter (Signed)
Pulmonary Telephone Encounter  Attempted to contact Marissa [name] via telephone. No answer. Left brief message with callback number to office.  If patient calls back, I would like to discuss bronchoscopy for her abnormal CT scan.  Rodman Pickle, M.D. [facility] Pulmonary/Critical Care Medicine 08/06/18 @ [phone]  FYI message sent to office.

## 2018-08-09 NOTE — Telephone Encounter (Signed)
LMTCB. Phone goes straight to voicemail.

## 2018-08-10 ENCOUNTER — Encounter: Payer: Self-pay | Admitting: *Deleted

## 2018-08-10 NOTE — Telephone Encounter (Signed)
Pulmonary Telephone Encounter  Our office has attempted to call Marissa [name] on 7/10 and 7/13. Attempted again today on 7/14.  No answer. Phone went straight to voicemail. Left brief message with callback number to office.  Will send letter to have patient contact our office to arrange for urgent appointment to discuss possible bronchoscopy.  Rodman Pickle, M.D. [facility] Pulmonary/Critical Care Medicine Pager: [phone] After hours pager: [phone]

## 2018-08-10 NOTE — Telephone Encounter (Signed)
After 3 attempts, a letter has been sent to patient to call to schedule urgent bronch.   Nothing further needed at this time.

## 2018-08-16 ENCOUNTER — Ambulatory Visit: Payer: Self-pay | Attending: Nurse Practitioner | Admitting: Nurse Practitioner

## 2018-08-16 ENCOUNTER — Encounter: Payer: Self-pay | Admitting: Nurse Practitioner

## 2018-08-16 ENCOUNTER — Telehealth: Payer: Self-pay | Admitting: Pulmonary Disease

## 2018-08-16 ENCOUNTER — Other Ambulatory Visit: Payer: Self-pay

## 2018-08-16 DIAGNOSIS — Z09 Encounter for follow-up examination after completed treatment for conditions other than malignant neoplasm: Secondary | ICD-10-CM

## 2018-08-16 DIAGNOSIS — J441 Chronic obstructive pulmonary disease with (acute) exacerbation: Secondary | ICD-10-CM

## 2018-08-16 DIAGNOSIS — G43009 Migraine without aura, not intractable, without status migrainosus: Secondary | ICD-10-CM

## 2018-08-16 DIAGNOSIS — F17200 Nicotine dependence, unspecified, uncomplicated: Secondary | ICD-10-CM

## 2018-08-16 DIAGNOSIS — F5104 Psychophysiologic insomnia: Secondary | ICD-10-CM

## 2018-08-16 MED ORDER — TRAZODONE HCL 100 MG PO TABS: 100.0000 mg | ORAL_TABLET | Freq: Every day | ORAL | 2 refills | Status: DC

## 2018-08-16 MED ORDER — NICOTINE 21 MG/24HR TD PT24: 21.0000 mg | MEDICATED_PATCH | Freq: Every day | TRANSDERMAL | 0 refills | Status: AC

## 2018-08-16 MED ORDER — IPRATROPIUM BROMIDE HFA 17 MCG/ACT IN AERS: 2.0000 | INHALATION_SPRAY | RESPIRATORY_TRACT | 12 refills | Status: DC | PRN

## 2018-08-16 MED ORDER — BUTALBITAL-APAP-CAFFEINE 50-325-40 MG PO TABS: 1.0000 | ORAL_TABLET | Freq: Four times a day (QID) | ORAL | 3 refills | Status: DC | PRN

## 2018-08-16 MED FILL — BUTALB-ACETAMIN-CAFF 50-325: 50-325-40 | 15 days supply | Qty: 60 | Fill #0

## 2018-08-16 MED FILL — NICOTINE 21 MG/24HR PATCH: 21 | 28 days supply | Qty: 28 | Fill #0

## 2018-08-16 MED FILL — traZODone HCL 100 MG TABS: 100 | 30 days supply | Qty: 30 | Fill #0

## 2018-08-16 NOTE — Telephone Encounter (Signed)
Thanks - JE

## 2018-08-16 NOTE — Progress Notes (Signed)
Virtual Visit via Telephone Note Due to national recommendations of social distancing due to [place] 19, telehealth visit is felt to be most appropriate for this patient at this time.  I discussed the limitations, risks, security and privacy concerns of performing an evaluation and management service by telephone and the availability of in person appointments. I also discussed with the patient that there may be a patient responsible charge related to this service. The patient expressed understanding and agreed to proceed.    I connected with Oletta Lamas on 08/16/18  at   8:50 AM EDT  EDT by telephone and verified that I am speaking with the correct person using two identifiers.   Consent I discussed the limitations, risks, security and privacy concerns of performing an evaluation and management service by telephone and the availability of in person appointments. I also discussed with the patient that there may be a patient responsible charge related to this service. The patient expressed understanding and agreed to proceed.   Location of Patient: Private Residence  Location of Provider: [place] and [employer] Office    Persons participating in Telemedicine visit: Geryl Rankins FNP-BC [place]    History of Present Illness: Telemedicine visit for: Establish Care  has a past medical history of Asthma, Chronic headaches, COPD (chronic obstructive pulmonary disease) ([place]), Depression, and Heart murmur.   She was admitted to the hospital for 2 days on 07-29-2018 with COPD exacerbation. She admits to smoking at least 1 and half packs of cigarettes per day on average since the age of 32. She continues to smoke on home O2 (3L) which was initiated upon discharge. She will need follow up with Pulmonology for EBUS. Where she is currently living has a hole in the roof where it leaks inside the home every time that it rains, and wash machine overflows where there is standing  water. She notes that there is black which she thinks is mold in the home and may be worsening her breathing. She was discharged home with PO doxy and prednisone. COVID NEGATIVE.   She has a history of anemia. She has not had a menstrual cycle in over 3-4 years. Post menopausal. Will need FOBT or colonoscopy.   She has a history a migraine headaches. Was prescribed Fioricet which she states is effective.    Endorses Insomnia. Difficulty falling and staying asleep. Depression screening also mildly elevated. Will trial her on trazodone.   Depression screen [facility] 2/9 08/16/2018  Decreased Interest 0  Down, Depressed, Hopeless 1  PHQ - 2 Score 1  Altered sleeping 1  Tired, decreased energy 1  Change in appetite 0  Feeling bad or failure about yourself  0  Trouble concentrating 0  Moving slowly or fidgety/restless 0  Suicidal thoughts 0  PHQ-9 Score 3      Past Medical History:  Diagnosis Date  . Asthma   . Chronic headaches   . COPD (chronic obstructive pulmonary disease) ([place])   . Depression   . Heart murmur     Past Surgical History:  Procedure Laterality Date  . TUBAL LIGATION      Family History  Problem Relation Age of Onset  . Heart disease Mother   . Dementia Father     Social History   Socioeconomic History  . Marital status: Single    Spouse name: Not on file  . Number of children: Not on file  . Years of education: Not on file  . Highest education  level: Not on file  Occupational History  . Not on file  Social Needs  . Financial resource strain: Not on file  . Food insecurity    Worry: Not on file    Inability: Not on file  . Transportation needs    Medical: Not on file    Non-medical: Not on file  Tobacco Use  . Smoking status: Current Every Day Smoker    Packs/day: 2.00    Years: 39.00    Pack years: 78.00    Types: Cigarettes    Start date: 06/22/1979  . Smokeless tobacco: Never Used  Substance and Sexual Activity  . Alcohol use: Yes     Comment: occasional  . Drug use: Not Currently    Types: Marijuana  . Sexual activity: Yes    Birth control/protection: Surgical  Lifestyle  . Physical activity    Days per week: Not on file    Minutes per session: Not on file  . Stress: Not on file  Relationships  . Social Herbalist on phone: Not on file    Gets together: Not on file    Attends religious service: Not on file    Active member of club or organization: Not on file    Attends meetings of clubs or organizations: Not on file    Relationship status: Not on file  Other Topics Concern  . Not on file  Social History Narrative  . Not on file     Observations/Objective: Awake, alert and oriented x 3   Review of Systems  Constitutional: Negative for fever, malaise/fatigue and weight loss.  HENT: Negative.  Negative for nosebleeds.   Eyes: Negative.  Negative for blurred vision, double vision and photophobia.  Respiratory: Positive for shortness of breath. Negative for cough.   Cardiovascular: Negative.  Negative for chest pain, palpitations and leg swelling.  Gastrointestinal: Negative.  Negative for heartburn, nausea and vomiting.  Musculoskeletal: Negative.  Negative for myalgias.  Neurological: Positive for headaches. Negative for dizziness, focal weakness and seizures.  Psychiatric/Behavioral: Negative for suicidal ideas. The patient has insomnia.     Assessment and Plan: Diagnoses and all orders for this visit:  Hospital discharge follow-up  Migraine without aura and without status migrainosus, not intractable -     butalbital-acetaminophen-caffeine (FIORICET) 50-325-40 MG tablet; Take 1 tablet by mouth every 6 (six) hours as needed for headache. Patient will pick up scripts today.  Psychophysiological insomnia -     traZODone (DESYREL) 100 MG tablet; Take 1 tablet (100 mg total) by mouth at bedtime. Patient will pick up scripts today.  Tobacco dependence -     nicotine (NICODERM CQ - DOSED IN  MG/24 HOURS) 21 mg/24hr patch; Place 1 patch (21 mg total) onto the skin daily. Patient will pick up scripts today. Carla was counseled on the dangers of tobacco use, and was advised to quit. Reviewed strategies to maximize success, including removing cigarettes and smoking materials from environment, stress management and support of family/friends as well as pharmacological alternatives including: Wellbutrin, Chantix, Nicotine patch, Nicotine gum or lozenges. Smoking cessation support: smoking cessation hotline: 1-800-QUIT-NOW.  Smoking cessation classes are also available through [facility]. Call [phone] or visit our website at [email or web address]   A total of 3 minutes was spent on counseling for smoking cessation and Devonda is ready to quit.   COPD exacerbation (HCC) -     ipratropium (ATROVENT HFA) 17 MCG/ACT inhaler; Inhale 2 puffs into the lungs  every 4 (four) hours as needed for wheezing (cough or shortness of breath). Patient will pick up scripts today. (Patient not taking: Reported on 08/18/2018)     Follow Up Instructions Return in about 4 weeks (around 09/13/2018).     I discussed the assessment and treatment plan with the patient. The patient was provided an opportunity to ask questions and all were answered. The patient agreed with the plan and demonstrated an understanding of the instructions.   The patient was advised to call back or seek an in-person evaluation if the symptoms worsen or if the condition fails to improve as anticipated.  I provided 24 minutes of non-face-to-face time during this encounter including median intraservice time, reviewing previous notes, labs, imaging, medications and explaining diagnosis and management.  Gildardo Pounds, FNP-BC

## 2018-08-16 NOTE — Telephone Encounter (Signed)
Called and spoke to pt. Pt has been scheduled for an appt with Dr. Loanne Drilling on 7/22 at 945 to discuss bronchoscopy, there is a held spot at 1030 to help with the double booking.   Will forward to Dr. Loanne Drilling to make aware.

## 2018-08-18 ENCOUNTER — Ambulatory Visit (INDEPENDENT_AMBULATORY_CARE_PROVIDER_SITE_OTHER): Payer: Self-pay | Admitting: Pulmonary Disease

## 2018-08-18 ENCOUNTER — Encounter: Payer: Self-pay | Admitting: Pulmonary Disease

## 2018-08-18 ENCOUNTER — Other Ambulatory Visit: Payer: Self-pay

## 2018-08-18 VITALS — BP 116/74 | HR 97 | Temp 98.6°F | Ht 62.0 in | Wt 175.6 lb

## 2018-08-18 DIAGNOSIS — R590 Localized enlarged lymph nodes: Secondary | ICD-10-CM

## 2018-08-18 DIAGNOSIS — R911 Solitary pulmonary nodule: Secondary | ICD-10-CM

## 2018-08-18 MED ORDER — BUDESONIDE-FORMOTEROL FUMARATE 160-4.5 MCG/ACT IN AERO: 2.0000 | INHALATION_SPRAY | Freq: Two times a day (BID) | RESPIRATORY_TRACT | 0 refills | Status: DC

## 2018-08-18 MED ORDER — BUDESONIDE-FORMOTEROL FUMARATE 160-4.5 MCG/ACT IN AERO: 2.0000 | INHALATION_SPRAY | Freq: Two times a day (BID) | RESPIRATORY_TRACT | 3 refills | Status: DC

## 2018-08-18 MED ORDER — SPIRIVA RESPIMAT 2.5 MCG/ACT IN AERS: 2.0000 | INHALATION_SPRAY | Freq: Every day | RESPIRATORY_TRACT | 0 refills | Status: DC

## 2018-08-18 MED ORDER — SPIRIVA HANDIHALER 18 MCG IN CAPS: 18.0000 ug | ORAL_CAPSULE | Freq: Every day | RESPIRATORY_TRACT | 3 refills | Status: DC

## 2018-08-18 MED FILL — SYMBICORT 160-4.5 MCG INH: 160-4.5 | 30 days supply | Qty: 10 | Fill #0

## 2018-08-18 MED FILL — SPIRIVA HANDIHALER 18MCG: 30 days supply | Qty: 30 | Fill #0

## 2018-08-18 NOTE — Patient Instructions (Addendum)
Hilar adenopathy Will plan for bronchoscopy with endobronchial ultrasound for tissue sampling Do not eat or drink on the night or the morning prior to procedure starting at 12:00 AM (midnight) Please arrange for a driver to take you to the procedure and take you home afterwards PROCEDURE SCHEDULED 08/24/18 at 1:00 PM  COPD/Asthma Will schedule pulmonary function test START Symbicort 2 puffs twice a day. Take this EVERYDAY START Spiriva 2 puffs once a day. Take this EVERYDAY  Tobacco Abuse QUIT SMOKING  Follow-up with me in 2 weeks

## 2018-08-18 NOTE — Progress Notes (Addendum)
Patient was seen today in a co-visit with Dr. Loanne Drilling.  Spiriva and Symbicort were reviewed with the patient, including name, instructions, indication, goals of therapy, potential side effects, importance of adherence, and safe use. She states she is familiar with HFA devices and correctly demonstrated technique (uses with a spacer). We also reviewed the Respimat and patient correctly demonstrated technique. Patient reports cost concerns with inhalers. Sent prescriptions to [facility] with request for assistance with cost.  Patient also reports she is working on smoking cessation. Brief counseling was provided and patient accepted [facility] referral, will coordinate.  Patient was advised to contact clinic if questions arise and she verbalized understanding by repeating back information. Patient was also provided an information handout.  See documentation under Dr. Cordelia Pen visit for details.   QUITLINE [place] REFERRAL:  Referring Organization Information:     Organization: [place]:  [place] Zip code: [phone]  In order to receive a participant's Outcome Report, you must be a HIPAA-Covered Entity _X_ Yes, we are a HIPAA-Covered Entity _____ Yes, I would like to receive an Outcome Report  Provider: Rodman Pickle  Fax: [phone]  Phone: [phone]    Person being referred to [facility]:  Name: Lota Leamer DOB: 02-14-1965  Address:  [place] [address]  Gender: female Pregnant: NO  Contact Information:  [phone]  Language preference: English  _YES_ I am ready to quit tobacco use within the next 30 days or have recently quit. I request [facility] to contact me to help me with my quit plan.  ____ I DO NOT give permission to [facility] to leave a message when contacting me.  Signature: Patient gave verbal permission to provider  Date: 08/18/2018   Check the BEST time for [facility] to call: - 9am - 12pm -  NOTE:  [facility] is open 24/7, but call attempts to participants are only made until midnight. Calls made over the weekend may be made at times outside of the 3-hour time frame selection.  Confidentiality Notice: This facsimile contains confidential information. If you have received this facsimile in error, please notify the sender immediately by telephone and confidentially dispose of the material. Do not review, disclose, copy, or distribute.

## 2018-08-18 NOTE — Progress Notes (Deleted)
   Subjective:    Patient ID: Ravenna Legore, female    DOB: 03-Nov-1965, 53 y.o.   MRN: [id number]  HPI    Review of Systems  Constitutional: Negative for fever and unexpected weight change.  HENT: Positive for dental problem, ear pain, rhinorrhea, sneezing and sore throat. Negative for congestion, nosebleeds, postnasal drip, sinus pressure and trouble swallowing.   Eyes: Negative for redness and itching.  Respiratory: Positive for chest tightness. Negative for cough, shortness of breath and wheezing.   Cardiovascular: Negative for palpitations and leg swelling.  Gastrointestinal: Negative for nausea and vomiting.  Genitourinary: Negative for dysuria.  Musculoskeletal: Negative for joint swelling.  Skin: Negative for rash.  Neurological: Positive for headaches.  Hematological: Does not bruise/bleed easily.  Psychiatric/Behavioral: Negative for dysphoric mood. The patient is nervous/anxious.        Objective:   Physical Exam        Assessment & Plan:

## 2018-08-18 NOTE — Progress Notes (Signed)
Subjective:   PATIENT ID: Marissa [name] GENDER: female DOB: 1965-03-07, MRN: [id number]   HPI  Chief Complaint  Patient presents with  . Hospitalization Follow-up    Discuss having Bronch. She c/o chest discomfort esp under the right breast for approx 2 wks. She states she has cut back to 6 cigs per day. She is using her neb every night and says she has 2 inhalers but unsure what they are called.     Reason for Visit: Hospital follow-up   Marissa [name] is a 53 year old female with COPD/asthma who presents to clinic for follow-up for mediastinal adenopathy.  She was seen as a pulmonary consult on 07/30/2018 while admitted for acute hypoxemic respiratory failure secondary to suspected COPD exacerbation.  During her work-up CTA demonstrated incidental left upper lobe spiculated lung mass and left hilar and mediastinal lymphadenopathy.  Endorses significant smoking history of 78 pack years and remains an active smoker. She is cutting down on smoking. She is now smoking <1/4 ppd now. She is compliant with her home oxygen and  uses her nebulizer once daily. Denies shortness of breath or wheezing however she has limited her activity significantly.  Social History: She previously smoked 2 ppd x 40 years   Environmental exposures: None  I have personally reviewed patient's past medical/family/social history, allergies, current medications.  Past Medical History:  Diagnosis Date  . Asthma   . Chronic headaches   . COPD (chronic obstructive pulmonary disease) ([place])   . Depression   . Heart murmur      Family History  Problem Relation Age of Onset  . Heart disease Mother   . Dementia Father      Social History   Occupational History  . Not on file  Tobacco Use  . Smoking status: Current Every Day Smoker    Packs/day: 2.00    Years: 39.00    Pack years: 78.00    Types: Cigarettes    Start date: 06/22/1979  . Smokeless tobacco: Never Used  Substance and Sexual Activity  . Alcohol  use: Yes    Comment: occasional  . Drug use: Not Currently    Types: Marijuana  . Sexual activity: Yes    Birth control/protection: Surgical    Allergies  Allergen Reactions  . Penicillins Nausea And Vomiting    Did it involve swelling of the face/tongue/throat, SOB, or low BP? No Did it involve sudden or severe rash/hives, skin peeling, or any reaction on the inside of your mouth or nose? No Did you need to seek medical attention at a hospital or doctor's office? No When did it last happen?20 years If all above answers are "NO", may proceed with cephalosporin use.     Outpatient Medications Prior to Visit  Medication Sig Dispense Refill  . butalbital-acetaminophen-caffeine (FIORICET) 50-325-40 MG tablet Take 1 tablet by mouth every 6 (six) hours as needed for headache. Patient will pick up scripts today. 60 tablet 3  . ipratropium-albuterol (DUONEB) 0.5-2.5 (3) MG/3ML SOLN Take 3 mLs by nebulization 3 (three) times daily. (Patient taking differently: Take 3 mLs by nebulization See admin instructions. Inhale 3 ml every night, may use up to 2 more times daily as needed for shortness of breath) 360 mL 0  . nicotine (NICODERM CQ - DOSED IN MG/24 HOURS) 21 mg/24hr patch Place 1 patch (21 mg total) onto the skin daily. Patient will pick up scripts today. 42 patch 0  . traZODone (DESYREL) 100 MG tablet Take 1 tablet (100 mg  total) by mouth at bedtime. Patient will pick up scripts today. 90 tablet 2  . ipratropium (ATROVENT HFA) 17 MCG/ACT inhaler Inhale 2 puffs into the lungs every 4 (four) hours as needed for wheezing (cough or shortness of breath). Patient will pick up scripts today. (Patient not taking: Reported on 08/18/2018) 1 Inhaler 12   No facility-administered medications prior to visit.     Review of Systems  Constitutional: Negative for chills, diaphoresis, fever, malaise/fatigue and weight loss.  HENT: Positive for ear pain and sore throat. Negative for congestion.    Respiratory: Negative for cough, hemoptysis, sputum production, shortness of breath and wheezing.   Cardiovascular: Negative for chest pain (chest tightness), palpitations and leg swelling.  Gastrointestinal: Negative for abdominal pain, heartburn and nausea.  Genitourinary: Negative for frequency.  Musculoskeletal: Negative for joint pain and myalgias.  Skin: Negative for itching and rash.  Neurological: Positive for headaches. Negative for dizziness and weakness.  Endo/Heme/Allergies: Does not bruise/bleed easily.  Psychiatric/Behavioral: Negative for depression. The patient is nervous/anxious.      Objective:   Vitals:   08/18/18 1011  BP: 116/74  Pulse: 97  Temp: 98.6 F (37 C)  TempSrc: Oral  SpO2: 97%  Weight: 175 lb 9.6 oz (79.7 kg)  Height: 5\' 2"  (1.575 m)   SpO2: 97 % O2 Device: Nasal cannula O2 Flow Rate (L/min): 3 L/min O2 Type: Continuous O2  Physical Exam: General: Well-appearing, no acute distress HENT: [place], AT, OP clear, MMM Eyes: EOMI, no scleral icterus Respiratory: Clear to auscultation bilaterally.  No crackles, wheezing or rales Cardiovascular: RRR, -M/R/G, no JVD GI: BS+, soft, nontender Extremities:-Edema,-tenderness Neuro: AAO x4, CNII-XII grossly intact Skin: Intact, no rashes or bruising Psych: Normal mood, normal affect  Data Reviewed:  Imaging: CTA 08/16/2018 - no pulmonary embolism present.  Left hilar and mediastinal lymphadenopathy.  9 mm spiculated nodule in the lingula  PFT: None on file  Labs: CBC and BMP 07/30/2018 reviewed.  Overall unremarkable Hg 9.3, platelets 447, glucose 135  Imaging, labs and tests noted above have been reviewed independently by me.    Assessment & Plan:    Discussion: 53 year old female with probable COPD who presents with hilar adenopathy and spiculated lung nodule. Per Fransisco Beau calculator, her solitary nodule has at least a 12% risk of malignancy.  Would recommend diagnostic testing.  We discussed  multiple modes of tissue sampling including CT-guided biopsy, bronchoscopy and surgery.  Bronchoscopy would potentially be able to offer both diagnostic and staging. We discussed risks and benefits of each procedure including infection, bleeding and pneumothorax.  After discussion, patient wishes to pursue diagnostic bronchoscopy.  During this clinic visit coordinating care with clinical pharmacist for bronchodilator therapy and education for patient's obstructive lung disease.  Hilar adenopathy Will plan for bronchoscopy with endobronchial ultrasound for tissue sampling Do not eat or drink on the night or the morning prior to procedure starting at 12:00 AM (midnight) Please arrange for a driver to take you to the procedure and take you home afterwards PROCEDURE SCHEDULED 08/24/18 at 1:00 PM  COPD/Asthma Will schedule pulmonary function test START Symbicort 2 puffs twice a day. Take this EVERYDAY START Spiriva 2 puffs once a day. Take this EVERYDAY  Chronic hypoxemic respiratory failure CONTINUE supplemental oxygen for goal SpO2 >88% Will perform ambulatory oxygen titration at next visit  Tobacco Abuse QUIT SMOKING  Follow-up with me in 2 weeks  Financial difficulty While she was inpatient, [facility] We will  send message to clinic coordinator to evaluate for resources and assistance to apply for Medicaid  Greater than 50% of this patient 45-minute office visit was spent face-to-face in counseling with the patient/family. We discussed medical diagnosis and treatment plan as noted.  Orders Placed This Encounter  Procedures  . Ambulatory referral to Pulmonology    Referral Priority:   Routine    Referral Type:   Consultation    Referral Reason:   Specialty Services Required    Requested Specialty:   Pulmonary Disease    Number of Visits Requested:   1   Meds ordered this encounter  Medications  . DISCONTD: budesonide-formoterol  (SYMBICORT) 160-4.5 MCG/ACT inhaler    Sig: Inhale 2 puffs into the lungs 2 (two) times a day.    Dispense:  1 Inhaler    Refill:  3    PLEASE HELP WITH ACCESS/AFFORDABILITY  . tiotropium (SPIRIVA HANDIHALER) 18 MCG inhalation capsule    Sig: Place 1 capsule (18 mcg total) into inhaler and inhale daily.    Dispense:  30 capsule    Refill:  3    PLEASE HELP WITH ACCESS/AFFORDABILITY  . budesonide-formoterol (SYMBICORT) 160-4.5 MCG/ACT inhaler    Sig: Inhale 2 puffs into the lungs 2 (two) times daily.    Dispense:  1 Inhaler    Refill:  0    Order Specific Question:   Lot Number?    Answer:   [id number]    Order Specific Question:   Expiration Date?    Answer:   06/26/2019    Order Specific Question:   Manufacturer?    Answer:   [employer] [[id number]]  . Tiotropium Bromide Monohydrate (SPIRIVA RESPIMAT) 2.5 MCG/ACT AERS    Sig: Inhale 2 puffs into the lungs daily.    Dispense:  4 g    Refill:  0    Order Specific Question:   Lot Number?    Answer:   [id number]    Order Specific Question:   Expiration Date?    Answer:   08/26/2020    Return in about 2 weeks (around 09/01/2018).  Briggs, MD [facility] 08/20/2018 2:11 PM  Office Number [phone]

## 2018-08-19 ENCOUNTER — Other Ambulatory Visit: Payer: Self-pay | Admitting: Pulmonary Disease

## 2018-08-19 ENCOUNTER — Encounter: Payer: Self-pay | Admitting: Nurse Practitioner

## 2018-08-19 ENCOUNTER — Encounter (HOSPITAL_COMMUNITY): Payer: Self-pay

## 2018-08-19 MED FILL — ATROVENT HFA INHALER: 17 | 12 days supply | Qty: 13 | Fill #0

## 2018-08-20 ENCOUNTER — Encounter: Payer: Self-pay | Admitting: Pulmonary Disease

## 2018-08-20 ENCOUNTER — Other Ambulatory Visit (HOSPITAL_COMMUNITY)
Admission: RE | Admit: 2018-08-20 | Discharge: 2018-08-20 | Disposition: A | Discharge status: Home / Self Care | Payer: Medicaid Other | Source: Ambulatory Visit | Attending: Pulmonary Disease | Admitting: Pulmonary Disease

## 2018-08-20 DIAGNOSIS — Z1159 Encounter for screening for other viral diseases: Secondary | ICD-10-CM | POA: Diagnosis not present

## 2018-08-20 DIAGNOSIS — R590 Localized enlarged lymph nodes: Secondary | ICD-10-CM | POA: Insufficient documentation

## 2018-08-20 DIAGNOSIS — R911 Solitary pulmonary nodule: Secondary | ICD-10-CM | POA: Insufficient documentation

## 2018-08-21 LAB — SARS CORONAVIRUS 2 (TAT 6-24 HRS): SARS Coronavirus 2: NEGATIVE

## 2018-08-23 NOTE — Progress Notes (Signed)
Spoke with patient who states that she has remained quarantined and is not experiencing any s/s of COVID 19 since her test. All questions answered. Jobe Igo, RN

## 2018-08-24 ENCOUNTER — Inpatient Hospital Stay (HOSPITAL_COMMUNITY): Payer: Medicaid Other

## 2018-08-24 ENCOUNTER — Encounter (HOSPITAL_COMMUNITY)
Admission: RE | Disposition: A | Discharge status: Home / Self Care | Payer: Self-pay | Source: Home / Self Care | Attending: Critical Care Medicine

## 2018-08-24 ENCOUNTER — Other Ambulatory Visit: Payer: Self-pay | Admitting: Pulmonary Disease

## 2018-08-24 ENCOUNTER — Inpatient Hospital Stay (HOSPITAL_COMMUNITY)
Admission: RE | Admit: 2018-08-24 | Discharge: 2018-08-27 | DRG: 199 | Disposition: A | Discharge status: Home / Self Care | Payer: Medicaid Other | Attending: Internal Medicine | Admitting: Internal Medicine

## 2018-08-24 ENCOUNTER — Ambulatory Visit (HOSPITAL_COMMUNITY): Payer: Medicaid Other | Admitting: Certified Registered Nurse Anesthetist

## 2018-08-24 ENCOUNTER — Encounter (HOSPITAL_COMMUNITY): Payer: Self-pay

## 2018-08-24 ENCOUNTER — Encounter (HOSPITAL_COMMUNITY)
Admission: RE | Admit: 2018-08-24 | Discharge: 2018-08-24 | Disposition: A | Discharge status: Home / Self Care | Payer: Medicaid Other | Source: Ambulatory Visit

## 2018-08-24 ENCOUNTER — Ambulatory Visit (HOSPITAL_COMMUNITY): Payer: Medicaid Other

## 2018-08-24 ENCOUNTER — Other Ambulatory Visit: Payer: Self-pay

## 2018-08-24 DIAGNOSIS — J9601 Acute respiratory failure with hypoxia: Secondary | ICD-10-CM | POA: Diagnosis present

## 2018-08-24 DIAGNOSIS — R590 Localized enlarged lymph nodes: Secondary | ICD-10-CM | POA: Diagnosis present

## 2018-08-24 DIAGNOSIS — Z4682 Encounter for fitting and adjustment of non-vascular catheter: Secondary | ICD-10-CM

## 2018-08-24 DIAGNOSIS — C3412 Malignant neoplasm of upper lobe, left bronchus or lung: Secondary | ICD-10-CM

## 2018-08-24 DIAGNOSIS — J939 Pneumothorax, unspecified: Secondary | ICD-10-CM

## 2018-08-24 DIAGNOSIS — J449 Chronic obstructive pulmonary disease, unspecified: Secondary | ICD-10-CM | POA: Diagnosis present

## 2018-08-24 DIAGNOSIS — J9811 Atelectasis: Secondary | ICD-10-CM | POA: Diagnosis present

## 2018-08-24 DIAGNOSIS — F329 Major depressive disorder, single episode, unspecified: Secondary | ICD-10-CM | POA: Diagnosis present

## 2018-08-24 DIAGNOSIS — Z818 Family history of other mental and behavioral disorders: Secondary | ICD-10-CM | POA: Diagnosis not present

## 2018-08-24 DIAGNOSIS — J9691 Respiratory failure, unspecified with hypoxia: Secondary | ICD-10-CM | POA: Diagnosis present

## 2018-08-24 DIAGNOSIS — D509 Iron deficiency anemia, unspecified: Secondary | ICD-10-CM | POA: Diagnosis present

## 2018-08-24 DIAGNOSIS — Z01818 Encounter for other preprocedural examination: Secondary | ICD-10-CM | POA: Insufficient documentation

## 2018-08-24 DIAGNOSIS — F17210 Nicotine dependence, cigarettes, uncomplicated: Secondary | ICD-10-CM | POA: Diagnosis present

## 2018-08-24 DIAGNOSIS — Z8249 Family history of ischemic heart disease and other diseases of the circulatory system: Secondary | ICD-10-CM | POA: Diagnosis not present

## 2018-08-24 DIAGNOSIS — J930 Spontaneous tension pneumothorax: Principal | ICD-10-CM | POA: Diagnosis present

## 2018-08-24 DIAGNOSIS — R51 Headache: Secondary | ICD-10-CM | POA: Diagnosis present

## 2018-08-24 DIAGNOSIS — J95811 Postprocedural pneumothorax: Secondary | ICD-10-CM

## 2018-08-24 DIAGNOSIS — R9389 Abnormal findings on diagnostic imaging of other specified body structures: Secondary | ICD-10-CM | POA: Diagnosis present

## 2018-08-24 DIAGNOSIS — R918 Other nonspecific abnormal finding of lung field: Secondary | ICD-10-CM | POA: Insufficient documentation

## 2018-08-24 DIAGNOSIS — R0602 Shortness of breath: Secondary | ICD-10-CM

## 2018-08-24 HISTORY — PX: ENDOBRONCHIAL ULTRASOUND: SHX5096

## 2018-08-24 HISTORY — PX: BRONCHIAL BRUSHINGS: SHX5108

## 2018-08-24 HISTORY — PX: BRONCHIAL NEEDLE ASPIRATION BIOPSY: SHX5106

## 2018-08-24 HISTORY — PX: VIDEO BRONCHOSCOPY: SHX5072

## 2018-08-24 HISTORY — PX: BRONCHIAL BIOPSY: SHX5109

## 2018-08-24 LAB — MRSA PCR SCREENING: MRSA by PCR: NEGATIVE

## 2018-08-24 LAB — PROTIME-INR
INR: 1 (ref 0.8–1.2)
Prothrombin Time: 12.6 seconds (ref 11.4–15.2)

## 2018-08-24 LAB — APTT: aPTT: 28 seconds (ref 24–36)

## 2018-08-24 SURGERY — ENDOBRONCHIAL ULTRASOUND (EBUS)
Anesthesia: General

## 2018-08-24 MED ORDER — PHENYLEPHRINE 40 MCG/ML (10ML) SYRINGE FOR IV PUSH (FOR BLOOD PRESSURE SUPPORT)
PREFILLED_SYRINGE | INTRAVENOUS | Status: DC | PRN
  Administered 2018-08-24 (×2): 80 ug via INTRAVENOUS
  Administered 2018-08-24: 120 ug via INTRAVENOUS
  Administered 2018-08-24 (×4): 80 ug via INTRAVENOUS
  Administered 2018-08-24: 120 ug via INTRAVENOUS

## 2018-08-24 MED ORDER — PROPOFOL 10 MG/ML IV BOLUS
INTRAVENOUS | Status: DC | PRN
  Administered 2018-08-24: 130 mg via INTRAVENOUS

## 2018-08-24 MED ORDER — FENTANYL CITRATE (PF) 100 MCG/2ML IJ SOLN
INTRAMUSCULAR | Status: AC
  Filled 2018-08-24: qty 2

## 2018-08-24 MED ORDER — FENTANYL CITRATE (PF) 100 MCG/2ML IJ SOLN
INTRAMUSCULAR | Status: DC | PRN
  Administered 2018-08-24 (×2): 50 ug via INTRAVENOUS
  Administered 2018-08-24: 100 ug via INTRAVENOUS

## 2018-08-24 MED ORDER — ALBUTEROL SULFATE (2.5 MG/3ML) 0.083% IN NEBU
INHALATION_SOLUTION | RESPIRATORY_TRACT | Status: AC
  Filled 2018-08-24: qty 3

## 2018-08-24 MED ORDER — KETOROLAC TROMETHAMINE 15 MG/ML IJ SOLN
15.0000 mg | Freq: Four times a day (QID) | INTRAMUSCULAR | Status: AC | PRN
  Administered 2018-08-25 (×2): 15 mg via INTRAVENOUS
  Filled 2018-08-24 (×2): qty 1

## 2018-08-24 MED ORDER — MORPHINE SULFATE (PF) 2 MG/ML IV SOLN: 2.0000 mg | INTRAVENOUS | Status: DC | PRN

## 2018-08-24 MED ORDER — MIDAZOLAM HCL 5 MG/5ML IJ SOLN
INTRAMUSCULAR | Status: DC | PRN
  Administered 2018-08-24: 2 mg via INTRAVENOUS

## 2018-08-24 MED ORDER — IPRATROPIUM-ALBUTEROL 0.5-2.5 (3) MG/3ML IN SOLN: 3.0000 mL | Freq: Four times a day (QID) | RESPIRATORY_TRACT | Status: DC | PRN

## 2018-08-24 MED ORDER — EPHEDRINE SULFATE-NACL 50-0.9 MG/10ML-% IV SOSY
PREFILLED_SYRINGE | INTRAVENOUS | Status: DC | PRN
  Administered 2018-08-24: 5 mg via INTRAVENOUS

## 2018-08-24 MED ORDER — ONDANSETRON HCL 4 MG/2ML IJ SOLN: 4.0000 mg | Freq: Four times a day (QID) | INTRAMUSCULAR | Status: DC | PRN

## 2018-08-24 MED ORDER — MORPHINE SULFATE (PF) 2 MG/ML IV SOLN
INTRAVENOUS | Status: AC
  Administered 2018-08-24: 2 mg via INTRAVENOUS
  Filled 2018-08-24: qty 1

## 2018-08-24 MED ORDER — HYDROCODONE-ACETAMINOPHEN 5-325 MG PO TABS
1.0000 | ORAL_TABLET | Freq: Four times a day (QID) | ORAL | Status: DC | PRN
  Administered 2018-08-24: 2 via ORAL
  Filled 2018-08-24: qty 2

## 2018-08-24 MED ORDER — LACTATED RINGERS IV SOLN
INTRAVENOUS | Status: DC | PRN
  Administered 2018-08-24 (×3): via INTRAVENOUS

## 2018-08-24 MED ORDER — MORPHINE SULFATE (PF) 2 MG/ML IV SOLN
2.0000 mg | INTRAVENOUS | Status: DC | PRN
  Administered 2018-08-24 – 2018-08-25 (×3): 2 mg via INTRAVENOUS
  Administered 2018-08-25 – 2018-08-27 (×4): 3 mg via INTRAVENOUS
  Filled 2018-08-24: qty 2
  Filled 2018-08-24 (×2): qty 1
  Filled 2018-08-24 (×2): qty 2
  Filled 2018-08-24: qty 1
  Filled 2018-08-24: qty 2

## 2018-08-24 MED ORDER — MIDAZOLAM HCL 2 MG/2ML IJ SOLN
INTRAMUSCULAR | Status: AC
  Filled 2018-08-24: qty 2

## 2018-08-24 MED ORDER — LIDOCAINE 2% (20 MG/ML) 5 ML SYRINGE
INTRAMUSCULAR | Status: DC | PRN
  Administered 2018-08-24: 60 mg via INTRAVENOUS

## 2018-08-24 MED ORDER — ACETAMINOPHEN 325 MG PO TABS: 650.0000 mg | ORAL_TABLET | ORAL | Status: DC | PRN

## 2018-08-24 MED ORDER — ONDANSETRON HCL 4 MG/2ML IJ SOLN
4.0000 mg | Freq: Once | INTRAMUSCULAR | Status: AC | PRN
  Administered 2018-08-24: 4 mg via INTRAVENOUS

## 2018-08-24 MED ORDER — ALBUTEROL SULFATE (2.5 MG/3ML) 0.083% IN NEBU
2.5000 mg | INHALATION_SOLUTION | Freq: Once | RESPIRATORY_TRACT | Status: AC
  Administered 2018-08-24: 2.5 mg via RESPIRATORY_TRACT

## 2018-08-24 MED ORDER — ROCURONIUM BROMIDE 10 MG/ML (PF) SYRINGE
PREFILLED_SYRINGE | INTRAVENOUS | Status: DC | PRN
  Administered 2018-08-24 (×2): 20 mg via INTRAVENOUS
  Administered 2018-08-24: 50 mg via INTRAVENOUS
  Administered 2018-08-24 (×2): 10 mg via INTRAVENOUS

## 2018-08-24 MED ORDER — LEVOFLOXACIN 500 MG PO TABS: 500.0000 mg | ORAL_TABLET | Freq: Every day | ORAL | 0 refills | Status: DC

## 2018-08-24 MED ORDER — CHLORHEXIDINE GLUCONATE CLOTH 2 % EX PADS
6.0000 | MEDICATED_PAD | Freq: Every day | CUTANEOUS | Status: DC
  Administered 2018-08-24 – 2018-08-27 (×4): 6 via TOPICAL

## 2018-08-24 MED ORDER — DEXAMETHASONE SODIUM PHOSPHATE 4 MG/ML IJ SOLN
INTRAMUSCULAR | Status: DC | PRN
  Administered 2018-08-24: 10 mg via INTRAVENOUS

## 2018-08-24 MED ORDER — SUGAMMADEX SODIUM 200 MG/2ML IV SOLN
INTRAVENOUS | Status: DC | PRN
  Administered 2018-08-24: 400 mg via INTRAVENOUS

## 2018-08-24 MED ORDER — SODIUM CHLORIDE 0.9 % IV SOLN
INTRAVENOUS | Status: DC
  Administered 2018-08-24: 12:00:00 via INTRAVENOUS

## 2018-08-24 MED ORDER — HYDROMORPHONE HCL 1 MG/ML IJ SOLN: 0.2500 mg | INTRAMUSCULAR | Status: DC | PRN

## 2018-08-24 MED ORDER — SODIUM CHLORIDE (PF) 0.9 % IJ SOLN
PREFILLED_SYRINGE | INTRAMUSCULAR | Status: DC | PRN
  Administered 2018-08-24 (×2): 5 mL

## 2018-08-24 MED ORDER — MEPERIDINE HCL 25 MG/ML IJ SOLN: 6.2500 mg | INTRAMUSCULAR | Status: DC | PRN

## 2018-08-24 MED ORDER — ORAL CARE MOUTH RINSE
15.0000 mL | Freq: Two times a day (BID) | OROMUCOSAL | Status: DC
  Administered 2018-08-24 – 2018-08-27 (×5): 15 mL via OROMUCOSAL

## 2018-08-24 MED ORDER — MORPHINE SULFATE (PF) 2 MG/ML IV SOLN
2.0000 mg | Freq: Once | INTRAVENOUS | Status: AC
  Administered 2018-08-24: 17:00:00 2 mg via INTRAVENOUS

## 2018-08-24 NOTE — Anesthesia Procedure Notes (Signed)
Procedure Name: Intubation Date/Time: 08/24/2018 1:06 PM Performed by: Claudia Desanctis, CRNA Pre-anesthesia Checklist: Patient identified, Emergency Drugs available, Suction available and Patient being monitored Patient Re-evaluated:Patient Re-evaluated prior to induction Oxygen Delivery Method: Circle system utilized Preoxygenation: Pre-oxygenation with 100% oxygen Induction Type: IV induction Ventilation: Mask ventilation without difficulty Laryngoscope Size: 2 and Miller Grade View: Grade I Tube type: Oral Tube size: 8.5 mm Number of attempts: 1 Airway Equipment and Method: Stylet Placement Confirmation: ETT inserted through vocal cords under direct vision,  positive ETCO2 and breath sounds checked- equal and bilateral Secured at: 20 cm Tube secured with: Tape Dental Injury: Teeth and Oropharynx as per pre-operative assessment

## 2018-08-24 NOTE — Procedures (Signed)
Chest Tube Insertion Procedure Note  Indications:  Clinically significant Pneumothorax  Pre-operative Diagnosis: Pneumothorax  Post-operative Diagnosis: Pneumothorax  Procedure Details  Informed consent was obtained for the procedure, including sedation.  Risks of lung perforation, hemorrhage, arrhythmia, and adverse drug reaction were discussed.   After sterile skin prep, using standard technique, a 14 French tube was placed in the left posterior 6-7th rib space.  Findings: 30 ml of serosanguinous fluid obtained  Estimated Blood Loss:  less than 50 mL         Specimens:  None              Complications:  None pt tolerated well. Reports pain but improved respirations         Disposition: ICU stable condition         Condition: stable  Attending Attestation: I arrived to take over procedure in process from Dr Loanne Drilling.

## 2018-08-24 NOTE — Anesthesia Preprocedure Evaluation (Signed)
Anesthesia Evaluation  Patient identified by MRN, date of birth, ID band Patient awake    Reviewed: Allergy & Precautions, NPO status , Patient's Chart, lab work & pertinent test results  Airway Mallampati: I  TM Distance: >3 FB Neck ROM: Full    Dental   Pulmonary Current Smoker,    Pulmonary exam normal        Cardiovascular Normal cardiovascular exam     Neuro/Psych Depression    GI/Hepatic   Endo/Other    Renal/GU      Musculoskeletal   Abdominal   Peds  Hematology   Anesthesia Other Findings   Reproductive/Obstetrics                             Anesthesia Physical Anesthesia Plan  ASA: III  Anesthesia Plan: General   Post-op Pain Management:    Induction: Intravenous  PONV Risk Score and Plan: 2 and Ondansetron and Treatment may vary due to age or medical condition  Airway Management Planned: Oral ETT  Additional Equipment:   Intra-op Plan:   Post-operative Plan:   Informed Consent: I have reviewed the patients History and Physical, chart, labs and discussed the procedure including the risks, benefits and alternatives for the proposed anesthesia with the patient or authorized representative who has indicated his/her understanding and acceptance.       Plan Discussed with: CRNA and Surgeon  Anesthesia Plan Comments:         Anesthesia Quick Evaluation

## 2018-08-24 NOTE — Transfer of Care (Signed)
Immediate Anesthesia Transfer of Care Note  Patient: Marissa [name]  Procedure(s) Performed: ENDOBRONCHIAL ULTRASOUND (N/A )  Patient Location: Endoscopy Unit  Anesthesia Type:General  Level of Consciousness: awake, alert , oriented and patient cooperative  Airway & Oxygen Therapy: Patient Spontanous Breathing and Patient connected to face mask  Post-op Assessment: Report given to RN and Post -op Vital signs reviewed and stable  Post vital signs: Reviewed and stable  Last Vitals:  Vitals Value Taken Time  BP    Temp    Pulse    Resp    SpO2      Last Pain:  Vitals:   08/24/18 1147  TempSrc: Temporal  PainSc: 9          Complications: No apparent anesthesia complications

## 2018-08-24 NOTE — Progress Notes (Unsigned)
Post-bronch antibiotics. Levaquin x 5 days.

## 2018-08-24 NOTE — H&P (Signed)
NAME:  Marissa [name], MRN:  [id number], DOB:  1965/11/14, LOS: 0 ADMISSION DATE:  08/24/2018, CONSULTATION DATE:  08/24/18 REFERRING MD:  Dr Loanne Drilling, CHIEF COMPLAINT:  L tension PTX  Brief History   53 yo cf with COPD in for EBUS with large L tension PTX after procedure.   History of present illness   53 yo cf with h/o copd/asthma and microcytic anemia was found to have mediatinal adenopathy and was brought to [facility] for outpt EBUS subsequently appeared to have increased sob and pallor, stat cxr revealed tension PTX. Pt received L CT small bore via seldinger technique.   Past Medical History   Past Medical History:  Diagnosis Date  . Asthma   . Chronic headaches   . COPD (chronic obstructive pulmonary disease) ([place])   . Depression   . Heart murmur     Significant Hospital Events   7/28 ptx  Consults:  none  Procedures:  7/28 ebus 7/28 L ct  Significant Diagnostic Tests:  7/28 cxr: Large L tension ptx  Micro Data:  none Antimicrobials:  none  Interim history/subjective:  none  Objective   Blood pressure (!) 118/56, pulse (!) 105, temperature 98.3 F (36.8 C), temperature source Temporal, resp. rate (!) 30, height 5\' 2"  (1.575 m), weight 81.6 kg, last menstrual period 04/24/2012, SpO2 98 %.        Intake/Output Summary (Last 24 hours) at 08/24/2018 1659 Last data filed at 08/24/2018 1543 Gross per 24 hour  Intake 2000 ml  Output 10 ml  Net 1990 ml   Filed Weights   08/24/18 1147  Weight: 81.6 kg    Examination: General: acutely uncomfortable, laying on side for chest tube placement HEENT: NCAT, EOMI, PERRLA, MMMP Lungs: tachypneic, absent on L., CTA on R, no wheezes, rhonchi, rales Cardiovascular: RRhythm, tachycardic, no m/g/r Abdomen: obese, soft, NT,ND, BS+ Extremities: no edema noted Skin: no rashes, warm and dry Neuro: moves all 4 extremities  Resolved Hospital Problem list   none  Assessment & Plan:  Acute L tension PTX;   -place stat chest tube   -maintain on suction -20cmH20  -wean oxygen as able  -pain control  -stat cxr post ct insertion Acute hypoxic resp failure:   -on nrb at this time 2/2 ptx  -wean as able COPD: without acute exacerbation  -bronchodilators prn Mediastinal adenopathy:   -s/p ebus  -await path   Best practice:  Diet: reg Pain/Anxiety/Delirium protocol (if indicated): morphine VAP protocol (if indicated): n/a DVT prophylaxis: scd GI prophylaxis: n/a Glucose control: n/a Mobility: bed Code Status: full Family Communication: spoke with pt Disposition: ICU for now.   Labs   CBC: No results for input(s): WBC, NEUTROABS, HGB, HCT, MCV, PLT in the last 168 hours.  Basic Metabolic Panel: No results for input(s): NA, K, CL, CO2, GLUCOSE, BUN, CREATININE, CALCIUM, MG, PHOS in the last 168 hours. GFR: CrCl cannot be calculated (Patient's most recent lab result is older than the maximum 21 days allowed.). No results for input(s): PROCALCITON, WBC, LATICACIDVEN in the last 168 hours.  Liver Function Tests: No results for input(s): AST, ALT, ALKPHOS, BILITOT, PROT, ALBUMIN in the last 168 hours. No results for input(s): LIPASE, AMYLASE in the last 168 hours. No results for input(s): AMMONIA in the last 168 hours.  ABG No results found for: PHART, PCO2ART, PO2ART, HCO3, TCO2, ACIDBASEDEF, O2SAT   Coagulation Profile: No results for input(s): INR, PROTIME in the last 168 hours.  Cardiac Enzymes: No results for input(s): CKTOTAL,  CKMB, CKMBINDEX, TROPONINI in the last 168 hours.  HbA1C: No results found for: HGBA1C  CBG: No results for input(s): GLUCAP in the last 168 hours.  Review of Systems:   Pain in my chest, sob. All other negative  Past Medical History  She,  has a past medical history of Asthma, Chronic headaches, COPD (chronic obstructive pulmonary disease) ([place]), Depression, and Heart murmur.   Surgical History    Past Surgical History:  Procedure Laterality Date  . TUBAL  LIGATION       Social History   reports that she has been smoking cigarettes. She started smoking about 39 years ago. She has a 78.00 pack-year smoking history. She has never used smokeless tobacco. She reports current alcohol use. She reports previous drug use. Drug: Marijuana.   Family History   Her family history includes Dementia in her father; Heart disease in her mother.   Allergies Allergies  Allergen Reactions  . Penicillins Nausea And Vomiting    Did it involve swelling of the face/tongue/throat, SOB, or low BP? No Did it involve sudden or severe rash/hives, skin peeling, or any reaction on the inside of your mouth or nose? No Did you need to seek medical attention at a hospital or doctor's office? No When did it last happen?20 years If all above answers are "NO", may proceed with cephalosporin use.     Home Medications  Prior to Admission medications   Medication Sig Start Date End Date Taking? Authorizing Provider  budesonide-formoterol (SYMBICORT) 160-4.5 MCG/ACT inhaler Inhale 2 puffs into the lungs 2 (two) times daily. 08/18/18  Yes Margaretha Seeds, MD  butalbital-acetaminophen-caffeine (FIORICET) [phone] MG tablet Take 1 tablet by mouth every 6 (six) hours as needed for headache. Patient will pick up scripts today. 08/16/18 09/15/18 Yes Gildardo Pounds, NP  diazepam (VALIUM) 10 MG tablet Take 10 mg by mouth daily as needed for anxiety.   Yes [provider]  ipratropium-albuterol (DUONEB) 0.5-2.5 (3) MG/3ML SOLN Take 3 mLs by nebulization 3 (three) times daily. Patient taking differently: Take 3 mLs by nebulization See admin instructions. Inhale 3 ml every night, may use up to 2 more times daily as needed for shortness of breath 07/31/18  Yes Vann, Willmer Fellers U, DO  nicotine (NICODERM CQ - DOSED IN MG/24 HOURS) 21 mg/24hr patch Place 1 patch (21 mg total) onto the skin daily. Patient will pick up scripts today. 08/16/18 09/27/18 Yes Gildardo Pounds, NP   Tiotropium Bromide Monohydrate (SPIRIVA RESPIMAT) 2.5 MCG/ACT AERS Inhale 2 puffs into the lungs daily. 08/18/18  Yes Margaretha Seeds, MD  traZODone (DESYREL) 100 MG tablet Take 1 tablet (100 mg total) by mouth at bedtime. Patient will pick up scripts today. 08/16/18 11/14/18 Yes Gildardo Pounds, NP  ipratropium (ATROVENT HFA) 17 MCG/ACT inhaler Inhale 2 puffs into the lungs every 4 (four) hours as needed for wheezing (cough or shortness of breath). Patient will pick up scripts today. Patient not taking: Reported on 08/18/2018 08/16/18   Gildardo Pounds, NP  levofloxacin (LEVAQUIN) 500 MG tablet Take 1 tablet (500 mg total) by mouth daily for 5 days. 08/24/18 08/29/18  Margaretha Seeds, MD  tiotropium (SPIRIVA HANDIHALER) 18 MCG inhalation capsule Place 1 capsule (18 mcg total) into inhaler and inhale daily. Patient not taking: Reported on 08/18/2018 08/18/18 08/18/19  Margaretha Seeds, MD     Critical care time: The patient is critically ill with multiple organ systems failure and requires high complexity decision making for  assessment and support, frequent evaluation and titration of therapies, application of advanced monitoring technologies and extensive interpretation of multiple databases.  Critical care time 35 mins. This represents my time independent of the NPs time taking care of the pt. This is excluding procedures.     Audria Nine DO Pager: [phone] After hours pager: [phone]  [facility] 08/24/2018, 4:59 PM

## 2018-08-24 NOTE — Op Note (Signed)
Video Bronchoscopy with Endobronchial Ultrasound Procedure Note  Date of Operation: 08/24/2018  Pre-op Diagnosis: Mediastinal and left hilar lymphadenopathy  Post-op Diagnosis: Mediastinal and left hilar lymphadenopathy; LUL endobronchial lesion  Surgeon: Margaretha Seeds, MD  Anesthesia: General endotracheal anesthesia  Operation: Flexible video fiberoptic bronchoscopy with endobronchial ultrasound and biopsies.  Estimated Blood Loss: Minimal  Complications: Left pneumothorax  Indications and History: Marissa [name] is a 53 y.o. female with mediastinal adenopathy and left hilar lymphadenopathy.  The risks, benefits, complications, treatment options and expected outcomes were discussed with the patient.  The possibilities of pneumothorax, pneumonia, reaction to medication, pulmonary aspiration, perforation of a viscus, bleeding, failure to diagnose a condition and creating a complication requiring transfusion or operation were discussed with the patient who freely signed the consent.    Description of Procedure: The patient was examined in the preoperative area and history and data from the preprocedure consultation were reviewed. It was deemed appropriate to proceed.  The patient was taken to Endoscopy 1, identified as Marissa [name] and the procedure verified as Flexible Video Fiberoptic Bronchoscopy.  A Time Out was held and the above information confirmed. After being taken to the operating room general anesthesia was initiated and the patient  was orally intubated.   The video fiberoptic bronchoscope was introduced via the endotracheal tube and a general inspection was performed. RUL, RML, RLL, LUL, LLL and their subsegments were visualized.  Exam showed variant anatomy of RUL with anterior and apical/posterior segments and left upper lobe endobronchial lesion occluding proximal entrance. Thick secretions present throughout the airways. Mucosa normal. Excessive dynamic airway collapse  present.  The standard scope was then withdrawn and the endobronchial ultrasound was used to identify and characterize the peritracheal, hilar and bronchial lymph nodes. Transbronchial needle sampling was performed as noted below.   Samples: 1. TBNA x 6 from station 7 (Slide x 3, rinse x 6 total) 2. TBNA x 6 from left hilar mass (Slide x 3, rinse x 6 total) 3. Wang needle biopsies x 3 from endobronchial lesion of LUL (Slide 3, rinse x 3 total) 4. Bronchial brushings x 3 from endobronchial lesion of LUL node (Slide x 3). Clipped bronchial brush tip for cytology  Required cold saline rinses and 5 ml of epi 1:10,000 x 2 for control of localized bleed. Bloody secretions suctioned with no evidence of active bleed.   The patient tolerated the procedure well without immediate complications. There was no significant blood loss. The bronchoscope was withdrawn. Anesthesia was reversed and the patient was taken to the PACU for recovery.  Post-procedure, patient report shoulder pain. CXR with left tension pneumothorax. Patient admitted to ICU for pigtail chest tube placement. Follow-up CXR with complete left resolution of mediastinal shift and pneumothorax.   Plans:  The patient will be discharged from the PACU to home when recovered from anesthesia. We will review the cytology, pathology and microbiology results with the patient when they become available.   Will remain in ICU overnight for monitoring. Management per ICU team.  Rodman Pickle, M.D. [facility] Pager: [phone] After hours pager: [phone]

## 2018-08-24 NOTE — Progress Notes (Signed)
1550- Pt arrived to recovery c/o 10/10 pain on left side of chest, pt pale and dusky with significant SOB, O2 sat 90% on simple mask.  MD Ola Spurr and MD Loanne Drilling at bedside. Albuterol neb ordered and given per MD Ola Spurr. Stat chest xray done. Improvement of O2 sat to 95%, pt still c/o SOB with tripod sitting position. Placed pt on non-rebreather. MD Loanne Drilling spoke with admitting physician- concern for pneumothorax. Report called to charge RN. Pt transferred directly to 1237 with MD Loanne Drilling at bedside.

## 2018-08-24 NOTE — Progress Notes (Signed)
Video Bronchoscopy done post Ebus procedure Intervention Bronchial wang needle biopsy done Intervention Bronchial brushing done

## 2018-08-24 NOTE — Progress Notes (Signed)
[facility] Progress Note Patient Name: Marissa [name] DOB: 11/27/1965 MRN: [id number]   Date of Service  08/24/2018  HPI/Events of Note  Pt is s/p chest tube for pneumothorax and is having pain at the CT site.  eICU Interventions  Morphine changed to 2-3 mg iv Q 3 hours prn pain, Toradol 15 mg iv Q 6 hours prn pain x 6 doses then d/c.        Kerry Kass Cherrie Franca 08/24/2018, 7:42 PM

## 2018-08-24 NOTE — Anesthesia Postprocedure Evaluation (Signed)
Anesthesia Post Note  Patient: Katria Botts  Procedure(s) Performed: ENDOBRONCHIAL ULTRASOUND (N/A )     Patient location during evaluation: PACU Anesthesia Type: General Level of consciousness: awake and alert Pain management: pain level controlled Vital Signs Assessment: post-procedure vital signs reviewed and stable Respiratory status: spontaneous breathing, non-rebreather facemask and respiratory function unstable (pt tachypneic upon extuabtion and arrival to PACU. CXR shows left pneumothorax. Pt to be brought up to ICU for chest tube placement by Pulmonologist. Sats >92% on NRB, RR 22 bpm.) Cardiovascular status: blood pressure returned to baseline and stable Postop Assessment: no apparent nausea or vomiting Anesthetic complications: no    Last Vitals:  Vitals:   08/24/18 1600 08/24/18 1610  BP: 131/73 (!) 118/56  Pulse: (!) 106 (!) 105  Resp: (!) 26 (!) 30  Temp:    SpO2: 90% 98%    Last Pain:  Vitals:   08/24/18 1610  TempSrc:   PainSc: 10-Worst pain ever                 Tiajuana Amass

## 2018-08-24 NOTE — H&P (Signed)
NAME:  Marissa [name], MRN:  [id number], DOB:  12/28/1965, LOS: 0 ADMISSION DATE:  08/24/2018, Procedure DATE:  08/24/18  History of present illness   Ms. Marissa [name] is a 53 year old female with COPD/asthma and mediastinal adenopathy who was admitted as an outpatient for bronchoscopy with EBUS for indication of mediastinal adenopathy.  Past Medical History   Active Ambulatory Problems    Diagnosis Date Noted  . Respiratory failure with hypoxia ([place]) 07/29/2018  . Headache 07/29/2018  . Microcytic anemia 07/29/2018  . Tobacco abuse 07/29/2018  . Lymphadenopathy, hilar 08/20/2018  . Nodule of left lung 08/20/2018  . Mediastinal lymphadenopathy 08/20/2018   Resolved Ambulatory Problems    Diagnosis Date Noted  . Elevated d-dimer 07/29/2018   Past Medical History:  Diagnosis Date  . Asthma   . Chronic headaches   . COPD (chronic obstructive pulmonary disease) ([place])   . Depression   . Heart murmur     Objective   Blood pressure 104/60, pulse 97, temperature 98.6 F (37 C), temperature source Temporal, resp. rate (!) 22, height 5\' 2"  (1.575 m), weight 81.6 kg, last menstrual period 04/24/2012, SpO2 98 %.       No intake or output data in the 24 hours ending 08/24/18 1243 Filed Weights   08/24/18 1147  Weight: 81.6 kg   Physical Exam: General: Well-appearing, no acute distress HENT: [place], AT, OP clear, MMM Eyes: EOMI, no scleral icterus Respiratory: Clear to auscultation bilaterally.  No crackles, wheezing or rales Cardiovascular: RRR, -M/R/G, no JVD GI: BS+, soft, nontender Extremities:-Edema,-tenderness Neuro: AAO x4, CNII-XII grossly intact Skin: Intact, no rashes or bruising Psych: Normal mood, normal affect  CTA 07/29/2018-no pulmonary embolus.  Left mediastinal and hilar lymphadenopathy.  9 mm spiculated nodule in the left lingula   Assessment & Plan:  Hilar and mediastinal adenopathy Lung nodule Imaging concerning for malignancy.  Patient consented for procedure.   We will perform bronchoscopy with endobronchial ultrasound with tissue sampling via needle aspirations, biopsies, brushings and/or washings.   Labs   CBC: No results for input(s): WBC, NEUTROABS, HGB, HCT, MCV, PLT in the last 168 hours.  Basic Metabolic Panel: No results for input(s): NA, K, CL, CO2, GLUCOSE, BUN, CREATININE, CALCIUM, MG, PHOS in the last 168 hours. GFR: CrCl cannot be calculated (Patient's most recent lab result is older than the maximum 21 days allowed.). No results for input(s): PROCALCITON, WBC, LATICACIDVEN in the last 168 hours.  Liver Function Tests: No results for input(s): AST, ALT, ALKPHOS, BILITOT, PROT, ALBUMIN in the last 168 hours. No results for input(s): LIPASE, AMYLASE in the last 168 hours. No results for input(s): AMMONIA in the last 168 hours.  ABG No results found for: PHART, PCO2ART, PO2ART, HCO3, TCO2, ACIDBASEDEF, O2SAT   Coagulation Profile: No results for input(s): INR, PROTIME in the last 168 hours.  Cardiac Enzymes: No results for input(s): CKTOTAL, CKMB, CKMBINDEX, TROPONINI in the last 168 hours.  HbA1C: No results found for: HGBA1C  CBG: No results for input(s): GLUCAP in the last 168 hours.  Review of Systems:   Review of Systems  Constitutional: Positive for malaise/fatigue. Negative for chills, diaphoresis, fever and weight loss.  HENT: Negative for congestion, ear pain and sore throat.   Respiratory: Positive for cough and shortness of breath. Negative for hemoptysis, sputum production and wheezing.   Cardiovascular: Negative for chest pain, palpitations and leg swelling.  Gastrointestinal: Negative for abdominal pain, heartburn and nausea.  Genitourinary: Negative for frequency.  Musculoskeletal: Negative for joint  pain and myalgias.  Skin: Negative for itching and rash.  Neurological: Negative for dizziness, weakness and headaches.  Endo/Heme/Allergies: Does not bruise/bleed easily.  Psychiatric/Behavioral:  Negative for depression. The patient is not nervous/anxious.    Past Medical History  She,  has a past medical history of Asthma, Chronic headaches, COPD (chronic obstructive pulmonary disease) ([place]), Depression, and Heart murmur.   Surgical History    Past Surgical History:  Procedure Laterality Date  . TUBAL LIGATION       Social History   reports that she has been smoking cigarettes. She started smoking about 39 years ago. She has a 78.00 pack-year smoking history. She has never used smokeless tobacco. She reports current alcohol use. She reports previous drug use. Drug: Marijuana.   Family History   Her family history includes Dementia in her father; Heart disease in her mother.   Allergies Allergies  Allergen Reactions  . Penicillins Nausea And Vomiting    Did it involve swelling of the face/tongue/throat, SOB, or low BP? No Did it involve sudden or severe rash/hives, skin peeling, or any reaction on the inside of your mouth or nose? No Did you need to seek medical attention at a hospital or doctor's office? No When did it last happen?20 years If all above answers are "NO", may proceed with cephalosporin use.     Home Medications  Prior to Admission medications   Medication Sig Start Date End Date Taking? Authorizing Provider  budesonide-formoterol (SYMBICORT) 160-4.5 MCG/ACT inhaler Inhale 2 puffs into the lungs 2 (two) times daily. 08/18/18  Yes Margaretha Seeds, MD  butalbital-acetaminophen-caffeine (FIORICET) [phone] MG tablet Take 1 tablet by mouth every 6 (six) hours as needed for headache. Patient will pick up scripts today. 08/16/18 09/15/18 Yes Gildardo Pounds, NP  diazepam (VALIUM) 10 MG tablet Take 10 mg by mouth daily as needed for anxiety.   Yes [provider]  ipratropium-albuterol (DUONEB) 0.5-2.5 (3) MG/3ML SOLN Take 3 mLs by nebulization 3 (three) times daily. Patient taking differently: Take 3 mLs by nebulization See admin instructions.  Inhale 3 ml every night, may use up to 2 more times daily as needed for shortness of breath 07/31/18  Yes Vann, Jessica U, DO  nicotine (NICODERM CQ - DOSED IN MG/24 HOURS) 21 mg/24hr patch Place 1 patch (21 mg total) onto the skin daily. Patient will pick up scripts today. 08/16/18 09/27/18 Yes Gildardo Pounds, NP  Tiotropium Bromide Monohydrate (SPIRIVA RESPIMAT) 2.5 MCG/ACT AERS Inhale 2 puffs into the lungs daily. 08/18/18  Yes Margaretha Seeds, MD  traZODone (DESYREL) 100 MG tablet Take 1 tablet (100 mg total) by mouth at bedtime. Patient will pick up scripts today. 08/16/18 11/14/18 Yes Gildardo Pounds, NP  ipratropium (ATROVENT HFA) 17 MCG/ACT inhaler Inhale 2 puffs into the lungs every 4 (four) hours as needed for wheezing (cough or shortness of breath). Patient will pick up scripts today. Patient not taking: Reported on 08/18/2018 08/16/18   Gildardo Pounds, NP  tiotropium (SPIRIVA HANDIHALER) 18 MCG inhalation capsule Place 1 capsule (18 mcg total) into inhaler and inhale daily. Patient not taking: Reported on 08/18/2018 08/18/18 08/18/19  Margaretha Seeds, MD     Rodman Pickle, M.D. [facility] Pulmonary/Critical Care Medicine Pager: [phone] After hours pager: [phone]

## 2018-08-25 ENCOUNTER — Encounter (HOSPITAL_COMMUNITY): Payer: Self-pay | Admitting: Pulmonary Disease

## 2018-08-25 ENCOUNTER — Inpatient Hospital Stay (HOSPITAL_COMMUNITY): Payer: Medicaid Other

## 2018-08-25 MED ORDER — OXYCODONE HCL 5 MG PO TABS
10.0000 mg | ORAL_TABLET | Freq: Four times a day (QID) | ORAL | Status: DC | PRN
  Administered 2018-08-25 – 2018-08-27 (×9): 10 mg via ORAL
  Filled 2018-08-25 (×9): qty 2

## 2018-08-25 NOTE — Progress Notes (Signed)
NAME:  Marissa [name], MRN:  [id number], DOB:  1965/03/06, LOS: 1 ADMISSION DATE:  08/24/2018, CONSULTATION DATE:  08/24/18 REFERRING MD:  Dr Loanne Drilling, CHIEF COMPLAINT:  L tension PTX  Brief History   53 yo cf with COPD in for EBUS with large L tension PTX after procedure.   History of present illness   53 yo cf with h/o copd/asthma and microcytic anemia was found to have mediatinal adenopathy and was brought to [facility] for outpt EBUS subsequently appeared to have increased sob and pallor, stat cxr revealed tension PTX. Pt received L CT small bore via seldinger technique.   Past Medical History   Past Medical History:  Diagnosis Date  . Asthma   . Chronic headaches   . COPD (chronic obstructive pulmonary disease) ([place])   . Depression   . Heart murmur     Significant Hospital Events   7/28 ptx  Consults:  none  Procedures:  7/28 ebus 7/28 L ct  Significant Diagnostic Tests:  7/28 cxr: Large L tension ptx  Micro Data:  none Antimicrobials:  none  Interim history/subjective:  7/29: doing better today. But still with pain. Pt utilizes non prescribed pain medications at home will change po pain meds. Have encouraged her to Rolm Bookbinder this first and then IV pain medication. Cont toradol  Objective   Blood pressure (!) 124/50, pulse 90, temperature 98.2 F (36.8 C), temperature source Oral, resp. rate (!) 27, height 5\' 2"  (1.575 m), weight 81.8 kg, last menstrual period 04/24/2012, SpO2 91 %.        Intake/Output Summary (Last 24 hours) at 08/25/2018 0722 Last data filed at 08/25/2018 0500 Gross per 24 hour  Intake 2000 ml  Output 460 ml  Net 1540 ml   Filed Weights   08/24/18 1147 08/25/18 0500  Weight: 81.6 kg 81.8 kg    Examination: General: well developed female resting in bed with wet towel on forehead. In nad HEENT: NCAT, EOMI, PERRLA, MMMP, poor dentition Lungs: tachypneic, absent on L., CTA on R, no wheezes, rhonchi, rales Cardiovascular: RRhythm, tachycardic, no  m/g/r Abdomen: obese, soft, NT,ND, BS+ Extremities: no edema noted Skin: no rashes, warm and dry Neuro: moves all 4 extremities  Resolved Hospital Problem list   none  Assessment & Plan:  Acute L tension PTX:   -improved after chest tube  -needs pulm hygiene as L lung with dense infiltrate/upper lobe atelectasis  -will provide ID and flutter  -maintain on suction -20cmH20, small airleak noted on exam  -wean oxygen as able  -pain control  -daily cxr Acute hypoxic resp failure:   -sat goal 88%  -wean as able COPD: without acute exacerbation  -bronchodilators prn Mediastinal adenopathy:   -s/p ebus  -await path   Best practice:  Diet: reg Pain/Anxiety/Delirium protocol (if indicated): morphine VAP protocol (if indicated): n/a DVT prophylaxis: scd GI prophylaxis: n/a Glucose control: n/a Mobility: bed Code Status: full Family Communication: spoke with pt Disposition: transfer to floor   Labs   CBC: No results for input(s): WBC, NEUTROABS, HGB, HCT, MCV, PLT in the last 168 hours.  Basic Metabolic Panel: No results for input(s): NA, K, CL, CO2, GLUCOSE, BUN, CREATININE, CALCIUM, MG, PHOS in the last 168 hours. GFR: CrCl cannot be calculated (Patient's most recent lab result is older than the maximum 21 days allowed.). No results for input(s): PROCALCITON, WBC, LATICACIDVEN in the last 168 hours.  Liver Function Tests: No results for input(s): AST, ALT, ALKPHOS, BILITOT, PROT, ALBUMIN in the last  168 hours. No results for input(s): LIPASE, AMYLASE in the last 168 hours. No results for input(s): AMMONIA in the last 168 hours.  ABG No results found for: PHART, PCO2ART, PO2ART, HCO3, TCO2, ACIDBASEDEF, O2SAT   Coagulation Profile: Recent Labs  Lab 08/24/18 1720  INR 1.0    Cardiac Enzymes: No results for input(s): CKTOTAL, CKMB, CKMBINDEX, TROPONINI in the last 168 hours.  HbA1C: No results found for: HGBA1C  CBG: No results for input(s): GLUCAP in the  last 168 hours.    ICU3 [place] DO Pager: [phone] After hours pager: [phone]  [place] [facility] 08/25/2018, 7:22 AM

## 2018-08-26 ENCOUNTER — Inpatient Hospital Stay (HOSPITAL_COMMUNITY): Payer: Medicaid Other

## 2018-08-26 ENCOUNTER — Telehealth: Payer: Self-pay | Admitting: Pulmonary Disease

## 2018-08-26 ENCOUNTER — Encounter (HOSPITAL_COMMUNITY): Payer: Self-pay | Admitting: Oncology

## 2018-08-26 DIAGNOSIS — J9601 Acute respiratory failure with hypoxia: Secondary | ICD-10-CM

## 2018-08-26 DIAGNOSIS — R590 Localized enlarged lymph nodes: Secondary | ICD-10-CM

## 2018-08-26 DIAGNOSIS — C3412 Malignant neoplasm of upper lobe, left bronchus or lung: Secondary | ICD-10-CM

## 2018-08-26 MED ORDER — ALBUTEROL SULFATE (2.5 MG/3ML) 0.083% IN NEBU: 2.5000 mg | INHALATION_SOLUTION | RESPIRATORY_TRACT | Status: DC | PRN

## 2018-08-26 MED ORDER — PANTOPRAZOLE SODIUM 40 MG PO TBEC
40.0000 mg | DELAYED_RELEASE_TABLET | Freq: Two times a day (BID) | ORAL | Status: DC
  Administered 2018-08-26 – 2018-08-27 (×3): 40 mg via ORAL
  Filled 2018-08-26 (×3): qty 1

## 2018-08-26 MED ORDER — IOHEXOL 300 MG/ML  SOLN
100.0000 mL | Freq: Once | INTRAMUSCULAR | Status: AC | PRN
  Administered 2018-08-26: 100 mL via INTRAVENOUS

## 2018-08-26 MED ORDER — IOHEXOL 300 MG/ML  SOLN
30.0000 mL | Freq: Once | INTRAMUSCULAR | Status: AC | PRN
  Administered 2018-08-26: 30 mL via ORAL

## 2018-08-26 MED ORDER — SODIUM CHLORIDE (PF) 0.9 % IJ SOLN
INTRAMUSCULAR | Status: AC
  Filled 2018-08-26: qty 50

## 2018-08-26 MED ORDER — METHYLPREDNISOLONE SODIUM SUCC 40 MG IJ SOLR
40.0000 mg | Freq: Two times a day (BID) | INTRAMUSCULAR | Status: DC
  Administered 2018-08-26 – 2018-08-27 (×3): 40 mg via INTRAVENOUS
  Filled 2018-08-26 (×3): qty 1

## 2018-08-26 NOTE — Progress Notes (Addendum)
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   History of present illness   53 yo cf with h/o copd/asthma and microcytic anemia was found to have mediastinal adenopathy and was brought to [facility] for outpt EBUS with LUL obst mass directly bx'd by  Mina Marble   subsequently appeared to have increased sob and pallor, stat cxr revealed tension PTX. Pt received L CT small bore via seldinger technique.   Past Medical History   Past Medical History:  Diagnosis Date  . Asthma   . Chronic headaches   . COPD (chronic obstructive pulmonary disease) ([place])   . Depression   . Heart murmur     Significant Hospital Events   7/28 ptx  Consults:  Oncology am 7/30  Procedures:  7/28 ebus bx of LUL c/w Small cell ca 7/28 L ct > changed to water seal am 7/30 as no lek   Significant Diagnostic Tests:  7/28 cxr: Large L tension ptx 7/30 cxr complete LUL ATX   Micro Data:  Covid 7/24 neg   Antimicrobials:  none  Interim history/subjective:   chest tube no longer leaking, pain improving and transitioning to po narcs  Objective   Blood pressure 125/61, pulse 83, temperature 98.2 F (36.8 C), temperature source Oral, resp. rate (!) 24, height 5\' 2"  (1.575 m), weight 81.8 kg, last menstrual period 04/24/2012, SpO2 91 %.        Intake/Output Summary (Last 24 hours) at 08/26/2018 0856 Last data filed at 08/26/2018 0500 Gross per 24 hour  Intake -  Output 295 ml  Net -295 ml   Filed Weights   08/24/18 1147 08/25/18 0500  Weight: 81.6 kg 81.8 kg    Examination:  Pt alert, approp nad @ 30 degrees  No jvd Oropharynx clear,  mucosa nl Neck supple Lungs insp /exp rhonchi worse on L  RRR no s3 or or sign murmur Abd soft nl excursion  Extr  warm with no edema or clubbing noted Neuro  Sensorium intact,  no apparent motor deficits     Resolved Hospital Problem list   none  Assessment & Plan:  Acute L tension PTX:   -resolved after chest tube with no air leak am 7/30  -needs pulm hygiene as L lung with complete upper lobe atelectasis/ post obst  -will provide IS and flutter  - changed to water seal am 7/30  -wean oxygen as able  -pain control  -daily cxr Acute hypoxic resp failure:   -sat goal 88%  -wean as able COPD: poor air movement on exam   rx symbicort / spiriva/ solumedrol rx  Starting 7/30  Mediastinal adenopathy:   -s/p ebus> bx of LUL mass pos small cell ca/ nodes neg      Best practice:  Diet: reg Pain/Anxiety/Delirium protocol (if indicated): morphine VAP protocol (if indicated): n/a DVT prophylaxis: scd GI prophylaxis: n/a Glucose control: n/a Mobility: bed Code Status: full Family Communication: spoke with pt - informed path still pending am 7/30  Disposition: transfer to floor    Labs   CBC: No results for input(s): WBC, NEUTROABS, HGB, HCT, MCV, PLT in the last 168 hours.  Basic Metabolic Panel: No results for input(s): NA, K,  CL, CO2, GLUCOSE, BUN, CREATININE, CALCIUM, MG, PHOS in the last 168 hours. GFR: CrCl cannot be calculated (Patient's most recent lab result is older than the maximum 21 days allowed.). No results for input(s): PROCALCITON, WBC, LATICACIDVEN in the last 168 hours.  Liver Function Tests: No results for input(s): AST, ALT, ALKPHOS, BILITOT, PROT, ALBUMIN in the last 168 hours. No results for input(s): LIPASE, AMYLASE in the last 168 hours. No results for input(s): AMMONIA in the last 168 hours.  ABG No results found for: PHART, PCO2ART, PO2ART, HCO3, TCO2, ACIDBASEDEF, O2SAT   Coagulation Profile: Recent Labs  Lab 08/24/18 1720  INR 1.0    Cardiac Enzymes: No results for input(s): CKTOTAL, CKMB, CKMBINDEX, TROPONINI in the last 168 hours.  HbA1C: No  results found for: HGBA1C  CBG: No results for input(s): GLUCAP in the last 168 hours.    Pt informed of dx am 7/30/ oncology referral done    Christinia Gully, MD Pulmonary and [place] [phone] After 5:30 PM or weekends, use [facility] [phone]

## 2018-08-26 NOTE — Telephone Encounter (Signed)
I spoke with Dr. Lyndon Code in Pathology. Patient with positive finding for small cell carcinoma. I have contacted Dr. Melvyn Novas who is currently caring for her as an inpatient at [facility]. He will consult Hematology as an in-patient.

## 2018-08-26 NOTE — Progress Notes (Signed)
I called and spoke with the patient briefly to let her know that Dr. Lisbeth Renshaw was aware of her recent diagnosis and that we would plan to meet with her to discuss radiation as a treatment outside of the hospital once she had completed her staging work up. She is in agreement with this plan.     Carola Rhine, PAC

## 2018-08-26 NOTE — Telephone Encounter (Signed)
Oncology consulted 08/26/2018

## 2018-08-26 NOTE — Progress Notes (Signed)
Pt given bottle of contrast at 1430 and finished at 1530. Second bottle of contrast given at 1530. Will call CT after second bottle is completed.

## 2018-08-26 NOTE — Telephone Encounter (Signed)
Routing to Dr. Loanne Drilling so she can contact Dr. Lyndon Code.

## 2018-08-26 NOTE — Consult Note (Addendum)
[place]  [phone] [phone]   MEDICAL ONCOLOGY - INITIAL CONSULTATION  Referral MD: Dr. Christinia Gully  Reason for Referral: Small cell lung cancer  HPI: Marissa [name] is a 53 year old female with a past medical history significant for asthma, COPD, tobacco dependence, depression, and chronic headaches.  The patient was admitted to the hospital on 07/29/2018 with progressive shortness of breath and pleuritic chest pain on her left side.  She was admitted for COPD exacerbation.  During the hospitalization, a CT angiogram of the chest was performed which showed malignant appearing left mediastinal and hilar lymphadenopathy narrowing the airways at the left hilum, and the left lung only a subtle 9 mm spiculated nodule is identified at the lingula, consider small cell carcinoma, indeterminate although low density left adrenal nodule, negative for acute pulmonary embolus.  She was seen by pulmonary during her hospitalization and arrangements were made for her to undergo EBUS as an outpatient.  The patient underwent EBUS on 08/24/2018.  She developed a left pneumothorax following the procedure and she was admitted to the ICU for pigtail chest tube placement.  Biopsy results returned this morning and were consistent with small cell lung cancer.  When seen today, the patient reports that her shortness of breath has improved overall.  She was placed on oxygen 3 L/min by nasal cannula following her prior hospitalization.  She currently has a cough and reported small amount of hemoptysis earlier this morning.  She states that she did not have a cough or any hemoptysis prior to this admission.  Denies fatigue, fevers, chills.  Denies anorexia and weight loss.  Denies night sweats.  She denies dizziness but reports headaches that she has had for at least 10 years.  However, she states that her headaches have been more "intense" lately.  Denies abdominal pain, nausea, vomiting,  constipation, diarrhea.  Denies epistaxis, hematemesis, hematuria, melena, hematochezia.  The patient is single but has a boyfriend.  She lives with a roommate.  She has 1 son who was in his 74s and lives locally.  The patient is a current smoker and has smoked 2 packs of cigarettes per day for the past 39 years.  She states that she has been trying to cut down over the past month and now smokes about 10 cigarettes/day.  Reports occasional social alcohol use.  Denies family history of cancer and blood disorders.  Medical oncology was asked see the patient to make recommendations regarding her newly diagnosed small cell lung cancer.   Past Medical History:  Diagnosis Date  . Asthma   . Chronic headaches   . COPD (chronic obstructive pulmonary disease) ([place])   . Depression   . Heart murmur   :  Past Surgical History:  Procedure Laterality Date  . BRONCHIAL BIOPSY  08/24/2018   Procedure: BRONCHIAL BIOPSIES;  Surgeon: Margaretha Seeds, MD;  Location: [facility];  Service: Cardiopulmonary;;  . BRONCHIAL BRUSHINGS  08/24/2018   Procedure: BRONCHIAL BRUSHINGS;  Surgeon: Margaretha Seeds, MD;  Location: [facility];  Service: Cardiopulmonary;;  . BRONCHIAL NEEDLE ASPIRATION BIOPSY  08/24/2018   Procedure: BRONCHIAL NEEDLE ASPIRATION BIOPSIES;  Surgeon: Margaretha Seeds, MD;  Location: [facility];  Service: Cardiopulmonary;;  . ENDOBRONCHIAL ULTRASOUND N/A 08/24/2018   Procedure: ENDOBRONCHIAL ULTRASOUND;  Surgeon: Margaretha Seeds, MD;  Location: [facility];  Service: Cardiopulmonary;  Laterality: N/A;  . TUBAL LIGATION    . VIDEO BRONCHOSCOPY  08/24/2018   Procedure: VIDEO BRONCHOSCOPY;  Surgeon: Loanne Drilling, Chi  Opal Sidles, MD;  Location: [facility];  Service: Cardiopulmonary;;  :  Current Facility-Administered Medications  Medication Dose Route Frequency Provider Last Rate Last Dose  . acetaminophen (TYLENOL) tablet 650 mg  650 mg Oral Q4H PRN Audria Nine, DO      . albuterol  (PROVENTIL) (2.5 MG/3ML) 0.083% nebulizer solution 2.5 mg  2.5 mg Nebulization Q4H PRN Tanda Rockers, MD      . Chlorhexidine Gluconate Cloth 2 % PADS 6 each  6 each Topical Daily Audria Nine, DO   6 each at 08/25/18 1725  . ketorolac (TORADOL) 15 MG/ML injection 15 mg  15 mg Intravenous Q6H PRN Frederik Pear, MD   15 mg at 08/25/18 2129  . MEDLINE mouth rinse  15 mL Mouth Rinse BID Audria Nine, DO   15 mL at 08/25/18 2125  . methylPREDNISolone sodium succinate (SOLU-MEDROL) 40 mg/mL injection 40 mg  40 mg Intravenous Q12H Christinia Gully B, MD      . morphine 2 MG/ML injection 2-3 mg  2-3 mg Intravenous Q3H PRN Frederik Pear, MD   3 mg at 08/26/18 0450  . ondansetron (ZOFRAN) injection 4 mg  4 mg Intravenous Q6H PRN Audria Nine, DO      . oxyCODONE (Oxy IR/ROXICODONE) immediate release tablet 10 mg  10 mg Oral Q6H PRN Audria Nine, DO   10 mg at 08/26/18 0758  . pantoprazole (PROTONIX) EC tablet 40 mg  40 mg Oral BID AC Tanda Rockers, MD         Allergies  Allergen Reactions  . Penicillins Nausea And Vomiting    Did it involve swelling of the face/tongue/throat, SOB, or low BP? No Did it involve sudden or severe rash/hives, skin peeling, or any reaction on the inside of your mouth or nose? No Did you need to seek medical attention at a hospital or doctor's office? No When did it last happen?20 years If all above answers are "NO", may proceed with cephalosporin use.  :  Family History  Problem Relation Age of Onset  . Heart disease Mother   . Dementia Father   :  Social History   Socioeconomic History  . Marital status: Single    Spouse name: Not on file  . Number of children: Not on file  . Years of education: Not on file  . Highest education level: Not on file  Occupational History  . Not on file  Social Needs  . Financial resource strain: Not on file  . Food insecurity    Worry: Not on file    Inability: Not on file  .  Transportation needs    Medical: Not on file    Non-medical: Not on file  Tobacco Use  . Smoking status: Current Every Day Smoker    Packs/day: 2.00    Years: 39.00    Pack years: 78.00    Types: Cigarettes    Start date: 06/22/1979  . Smokeless tobacco: Never Used  Substance and Sexual Activity  . Alcohol use: Yes    Comment: occasional  . Drug use: Not Currently    Types: Marijuana  . Sexual activity: Yes    Birth control/protection: Surgical  Lifestyle  . Physical activity    Days per week: Not on file    Minutes per session: Not on file  . Stress: Not on file  Relationships  . Social Herbalist on phone: Not on file    Gets together: Not on file  Attends religious service: Not on file    Active member of club or organization: Not on file    Attends meetings of clubs or organizations: Not on file    Relationship status: Not on file  . Intimate partner violence    Fear of current or ex partner: Not on file    Emotionally abused: Not on file    Physically abused: Not on file    Forced sexual activity: Not on file  Other Topics Concern  . Not on file  Social History Narrative  . Not on file  :  Review of Systems: A comprehensive 14 point review of systems was negative except as noted in the HPI.  Exam: Patient Vitals for the past 24 hrs:  BP Temp Temp src Pulse Resp SpO2  08/26/18 0800 - 98.2 F (36.8 C) Oral - - -  08/26/18 0551 125/61 - - 83 (!) 24 91 %  08/26/18 0323 - 98.3 F (36.8 C) Oral - - -  08/25/18 2000 128/63 - - 84 19 94 %  08/25/18 1944 - 98.5 F (36.9 C) Oral - - -  08/25/18 1900 (!) 128/53 - - 87 (!) 21 95 %  08/25/18 1800 (!) 138/98 - - 89 (!) 27 93 %  08/25/18 1600 - 98.1 F (36.7 C) Oral - - -  08/25/18 1300 (!) 123/52 - - 99 (!) 21 92 %  08/25/18 1200 (!) 114/45 98.4 F (36.9 C) Oral 88 20 94 %  08/25/18 1100 (!) 113/52 - - 89 17 92 %    General:  well-nourished in no acute distress.   Eyes:  no scleral icterus.   ENT:   There were no oropharyngeal lesions.   Neck was without thyromegaly.   Lymphatics:  Negative cervical, supraclavicular or axillary adenopathy.   Respiratory: Rhonchi noted on the left, clear on the right. Cardiovascular:  Regular rate and rhythm, S1/S2, without murmur, rub or gallop.  There was no pedal edema.   GI:  abdomen was soft, flat, nontender, nondistended, without organomegaly.   Muscoloskeletal:  no spinal tenderness of palpation of vertebral spine.   Skin exam was without echymosis, petichae.   Neuro exam was nonfocal. Patient was alert and oriented.  Attention was good.   Language was appropriate.  Mood was normal without depression.  Speech was not pressured.  Thought content was not tangential.     Lab Results  Component Value Date   WBC 9.5 07/30/2018   HGB 9.3 (L) 07/30/2018   HCT 35.4 (L) 07/30/2018   PLT 447 (H) 07/30/2018   GLUCOSE 135 (H) 07/30/2018   TRIG 75 07/29/2018   ALT 34 07/29/2018   AST 36 07/29/2018   NA 143 07/30/2018   K 4.4 07/30/2018   CL 106 07/30/2018   CREATININE 0.47 07/30/2018   BUN 12 07/30/2018   CO2 29 07/30/2018    Ct Angio Chest Pe W Or Wo Contrast  Result Date: 07/29/2018 CLINICAL DATA:  52 year old female with chest pain and shortness of breath. Abnormal D-dimer. EXAM: CT ANGIOGRAPHY CHEST WITH CONTRAST TECHNIQUE: Multidetector CT imaging of the chest was performed using the standard protocol during bolus administration of intravenous contrast. Multiplanar CT image reconstructions and MIPs were obtained to evaluate the vascular anatomy. CONTRAST:  115mL OMNIPAQUE IOHEXOL 350 MG/ML SOLN COMPARISON:  Portable chest earlier today. FINDINGS: Cardiovascular: Good contrast bolus timing in the pulmonary arterial tree. Mild respiratory motion. No focal filling defect identified in the pulmonary arteries to suggest acute pulmonary  embolism. Cardiomegaly.  No pericardial effusion.  Negative visible aorta. Mediastinum/Nodes: Mediastinal and left hilar  malignant appearing lymphadenopathy. Prevascular abnormal nodes individually up to 27 millimeter short axis. Soft tissue encasing the left hilum (series 5, image 46). Right hilar lymph nodes remain normal. No lymphadenopathy identified in the visible lower neck. No axillary lymphadenopathy. Lungs/Pleura: Low lung volumes with diffuse crowding of markings and atelectasis. Mass effect on the airways at the left hilum, but the major airways remain patent. There is a small spiculated nodule in the lingula measuring about 9 millimeters on series 6, image 65. Otherwise only vague sub solid nodules are occasionally identified in the left lung. Some paraseptal emphysema in the contralateral right lung. Mild linear atelectasis or scarring in the lateral segment of the right middle lobe. No pleural effusion. Upper Abdomen: Negative visible liver, gallbladder, spleen, pancreas, stomach and kidneys. There is an oval 33 millimeter left adrenal nodule. This has low-density (4-5 Hounsfield units) but is indeterminate in this clinical setting. Musculoskeletal: Chronic or congenital fusion of the right lateral 1st and 2nd ribs. No acute or suspicious osseous lesion. Review of the MIP images confirms the above findings. IMPRESSION: 1. Malignant appearing left mediastinal and hilar lymphadenopathy narrowing the airways at the left hilum. In the left lung only a subtle 9 mm spiculated nodule is identified in the lingula. Consider small cell carcinoma. Bronchoscopy or mediastinal node sampling might be most valuable for diagnosis. 2. Indeterminate although low-density (which typically which indicates a benign adenoma) left adrenal nodule. 3.  Negative for acute pulmonary embolus. Electronically Signed   By: Genevie Ann M.D.   On: 07/29/2018 20:05   Dg Chest Port 1 View  Result Date: 08/25/2018 CLINICAL DATA:  Pneumothorax. EXAM: PORTABLE CHEST 1 VIEW COMPARISON:  Chest x-rays dated 08/24/2018 FINDINGS: Left chest tube is unchanged in  position. No visible residual pneumothorax. However the, there is increased volume loss and consolidation in the left upper and lower lung zones. Subcutaneous emphysema on the left has almost completely resolved. Chronic cardiomegaly. Right lung is clear. Pulmonary vascularity on the right is normal. No acute bone abnormality. 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 IMPRESSION: 1. Increasing volume loss and consolidation in the left upper and lower lung zones. 2. No residual pneumothorax. Electronically Signed   By: Lorriane Shire M.D.   On: 08/25/2018 08:20   Dg Chest Port 1 View  Result Date: 08/24/2018 CLINICAL DATA:  Chest tube placement EXAM: PORTABLE CHEST 1 VIEW COMPARISON:  Portable exam 1701 hours compared to 1552 hours FINDINGS: Interval placement of a pigtail LEFT thoracostomy tube. Resolution of previously identified large LEFT tension pneumothorax. Persistent atelectasis in LEFT upper lobe and portions of LEFT lower lobe. Resolution of mediastinal shift. Small amount of LEFT lateral chest wall gas. Heart size upper normal. Subsegmental atelectasis RIGHT lung base. IMPRESSION: Resolution of LEFT tension pneumothorax following thoracostomy tube insertion. Persistent significant atelectasis of LEFT lung and minimal subsegmental atelectasis at RIGHT lung base. Electronically Signed   By: Lavonia Dana M.D.   On: 08/24/2018 17:29   Dg Chest Port 1 View  Result Date: 08/24/2018 CLINICAL DATA:  Shortness of breath.  Status post bronchoscopy.  5 EXAM: PORTABLE CHEST 1 VIEW COMPARISON:  Chest x-ray dated 03/29/2018 FINDINGS: The patient has a large left tension pneumothorax with shift of the heart mediastinal structures to the right. There is chronic cardiomegaly. Pulmonary vascularity is normal. Right lung is clear. The left lung has almost completely collapsed. No acute bone  abnormality. IMPRESSION: Large left tension pneumothorax. Critical Value/emergent results were called by telephone  at the time of interpretation on 08/24/2018 at 4:36 pm to [place], [place] , who verbally acknowledged these results. Electronically Signed   By: Lorriane Shire M.D.   On: 08/24/2018 16:38   Dg Chest Port 1 View  Result Date: 07/29/2018 CLINICAL DATA:  Shortness of breath, cough EXAM: PORTABLE CHEST 1 VIEW COMPARISON:  03/06/2013 FINDINGS: There is no focal consolidation. There is no pleural effusion or pneumothorax. The cardiac silhouette is relatively enlarged which may be projectional given the AP view. There is no acute osseous abnormality. IMPRESSION: No active disease. Electronically Signed   By: Kathreen Devoid   On: 07/29/2018 12:12   Ct Angio Chest Pe W Or Wo Contrast  Result Date: 07/29/2018 CLINICAL DATA:  53 year old female with chest pain and shortness of breath. Abnormal D-dimer. EXAM: CT ANGIOGRAPHY CHEST WITH CONTRAST TECHNIQUE: Multidetector CT imaging of the chest was performed using the standard protocol during bolus administration of intravenous contrast. Multiplanar CT image reconstructions and MIPs were obtained to evaluate the vascular anatomy. CONTRAST:  179mL OMNIPAQUE IOHEXOL 350 MG/ML SOLN COMPARISON:  Portable chest earlier today. FINDINGS: Cardiovascular: Good contrast bolus timing in the pulmonary arterial tree. Mild respiratory motion. No focal filling defect identified in the pulmonary arteries to suggest acute pulmonary embolism. Cardiomegaly.  No pericardial effusion.  Negative visible aorta. Mediastinum/Nodes: Mediastinal and left hilar malignant appearing lymphadenopathy. Prevascular abnormal nodes individually up to 27 millimeter short axis. Soft tissue encasing the left hilum (series 5, image 46). Right hilar lymph nodes remain normal. No lymphadenopathy identified in the visible lower neck. No axillary lymphadenopathy. Lungs/Pleura: Low lung volumes with diffuse crowding of markings and atelectasis. Mass effect on the airways at the left hilum, but the major airways remain  patent. There is a small spiculated nodule in the lingula measuring about 9 millimeters on series 6, image 65. Otherwise only vague sub solid nodules are occasionally identified in the left lung. Some paraseptal emphysema in the contralateral right lung. Mild linear atelectasis or scarring in the lateral segment of the right middle lobe. No pleural effusion. Upper Abdomen: Negative visible liver, gallbladder, spleen, pancreas, stomach and kidneys. There is an oval 33 millimeter left adrenal nodule. This has low-density (4-5 Hounsfield units) but is indeterminate in this clinical setting. Musculoskeletal: Chronic or congenital fusion of the right lateral 1st and 2nd ribs. No acute or suspicious osseous lesion. Review of the MIP images confirms the above findings. IMPRESSION: 1. Malignant appearing left mediastinal and hilar lymphadenopathy narrowing the airways at the left hilum. In the left lung only a subtle 9 mm spiculated nodule is identified in the lingula. Consider small cell carcinoma. Bronchoscopy or mediastinal node sampling might be most valuable for diagnosis. 2. Indeterminate although low-density (which typically which indicates a benign adenoma) left adrenal nodule. 3.  Negative for acute pulmonary embolus. Electronically Signed   By: Genevie Ann M.D.   On: 07/29/2018 20:05   Dg Chest Port 1 View  Result Date: 08/25/2018 CLINICAL DATA:  Pneumothorax. EXAM: PORTABLE CHEST 1 VIEW COMPARISON:  Chest x-rays dated 08/24/2018 FINDINGS: Left chest tube is unchanged in position. No visible residual pneumothorax. However the, there is increased volume loss and consolidation in the left upper and lower lung zones. Subcutaneous emphysema on the left has almost completely resolved. Chronic cardiomegaly. Right lung is clear. Pulmonary vascularity on the right is normal. No acute bone abnormality. Congenital anomaly of the right first and second  ribs. IMPRESSION: 1. Increasing volume loss and consolidation in the left  upper and lower lung zones. 2. No residual pneumothorax. Electronically Signed   By: Lorriane Shire M.D.   On: 08/25/2018 08:20   Dg Chest Port 1 View  Result Date: 08/24/2018 CLINICAL DATA:  Chest tube placement EXAM: PORTABLE CHEST 1 VIEW COMPARISON:  Portable exam 1701 hours compared to 1552 hours FINDINGS: Interval placement of a pigtail LEFT thoracostomy tube. Resolution of previously identified large LEFT tension pneumothorax. Persistent atelectasis in LEFT upper lobe and portions of LEFT lower lobe. Resolution of mediastinal shift. Small amount of LEFT lateral chest wall gas. Heart size upper normal. Subsegmental atelectasis RIGHT lung base. IMPRESSION: Resolution of LEFT tension pneumothorax following thoracostomy tube insertion. Persistent significant atelectasis of LEFT lung and minimal subsegmental atelectasis at RIGHT lung base. Electronically Signed   By: Lavonia Dana M.D.   On: 08/24/2018 17:29   Dg Chest Port 1 View  Result Date: 08/24/2018 CLINICAL DATA:  Shortness of breath.  Status post bronchoscopy.  5 EXAM: PORTABLE CHEST 1 VIEW COMPARISON:  Chest x-ray dated 03/29/2018 FINDINGS: The patient has a large left tension pneumothorax with shift of the heart mediastinal structures to the right. There is chronic cardiomegaly. Pulmonary vascularity is normal. Right lung is clear. The left lung has almost completely collapsed. No acute bone abnormality. IMPRESSION: Large left tension pneumothorax. Critical Value/emergent results were called by telephone at the time of interpretation on 08/24/2018 at 4:36 pm to [place], [place] , who verbally acknowledged these results. Electronically Signed   By: Lorriane Shire M.D.   On: 08/24/2018 16:38   Dg Chest Port 1 View  Result Date: 07/29/2018 CLINICAL DATA:  Shortness of breath, cough EXAM: PORTABLE CHEST 1 VIEW COMPARISON:  03/06/2013 FINDINGS: There is no focal consolidation. There is no pleural effusion or pneumothorax. The cardiac silhouette is  relatively enlarged which may be projectional given the AP view. There is no acute osseous abnormality. IMPRESSION: No active disease. Electronically Signed   By: Kathreen Devoid   On: 07/29/2018 12:12   PATHOLOGY: Diagnosis BRONCHIAL BRUSHING, LUL (SPECIMEN 4 OF 4 COLLECTED 08/24/18): MALIGNANT CELLS PRESENT, MOST CONSISTENT WITH SMALL CELL CARCINOMA. SEE COMMENT. COMMENT. DR. Roderfield ON 7.30.2020. Enid Cutter MD Pathologist, Electronic Signature (Case signed 08/26/2018)  Assessment and Plan:  1.  Small cell lung cancer 2.  COPD 3.  Tobacco dependence 4.  Acute left tension pneumothorax 5. Iron deficient anemia   -Diagnosis discussed with the patient.  Recommend that she complete the staging work-up to include a CT of the abdomen and pelvis as well as an MRI of the brain. -I briefly discussed with the patient that if she has limited stage small cell lung cancer treatment would consist of chemotherapy with radiation.  However, as she has extensive stage small cell lung cancer, we will consider her for systemic chemotherapy. -COPD and left tension pneumothorax management per PCCM. -Recommend smoking cessation.  Thank you for this referral.   Mikey Bussing, DNP, AGPCNP-BC, AOCNP  Addendum  I have seen the patient, examined her. I agree with the assessment and and plan and have edited the notes.   Marissa [name] is a 53 yo female with past medical history of heavy smoking, COPD, was admitted to hospital recently for COPD exacerbation, and CT chest reviewed left hilar mass and mediastinal adenopathy.  She underwent bronchoscopy and EBUS biopsy on 7/28 and was admitted after procedure for pneumothorax. I reviewed her CT  images and biopsy results. If CT AP and brain MRI negative for distant metastasis, then she has locally advanced limited stage small cell lung cancer. Although her left hilar and mediastinal lymph node biopsy were negative for malignant cells, CT  scan was highly suspicious for hilar and mediastinal adenopathy.  I will get PET scan as outpt for further evaluation, if CT and brain MRI are negative for distant metastasis.   I discussed treatment options for limited stage small cell lung cancer.  Given her clinical node positive disease, I recommend concurrent chemo and radiation, with cisplatin and etoposide, with curative intent.  I discussed prophylactic cranial radiation.  I reviewed the treatment course, risk of recurrence after treatment, and disease monitoring after therapy.she is interested in treatment.  Pt does have not insurance, will get SW involved to help her. She has limited social support. She lives with her boy friend, does not have a car now, but her room mate will be available to help her transportation for treatment. She has a son and a daughter but they do not know her cancer diagnosis.  I will set up her outpt f/u, referral to rad/onc Dr. Lisbeth Renshaw, PET scan, chemo class, and plan to start chemo next week.   Please give one dose IV feraheme for her IDA tomorrow. Check stool OB.   Discharge per primary team.   [name]  08/26/2018

## 2018-08-27 ENCOUNTER — Inpatient Hospital Stay (HOSPITAL_COMMUNITY): Payer: Medicaid Other

## 2018-08-27 ENCOUNTER — Other Ambulatory Visit: Payer: Self-pay | Admitting: Hematology

## 2018-08-27 DIAGNOSIS — C3490 Malignant neoplasm of unspecified part of unspecified bronchus or lung: Secondary | ICD-10-CM

## 2018-08-27 DIAGNOSIS — C3492 Malignant neoplasm of unspecified part of left bronchus or lung: Secondary | ICD-10-CM

## 2018-08-27 DIAGNOSIS — J9611 Chronic respiratory failure with hypoxia: Secondary | ICD-10-CM

## 2018-08-27 MED ORDER — ONDANSETRON HCL 8 MG PO TABS: 8.0000 mg | ORAL_TABLET | Freq: Two times a day (BID) | ORAL | 1 refills | Status: DC | PRN

## 2018-08-27 MED ORDER — PROCHLORPERAZINE MALEATE 10 MG PO TABS: 10.0000 mg | ORAL_TABLET | Freq: Four times a day (QID) | ORAL | 1 refills | Status: DC | PRN

## 2018-08-27 MED ORDER — SODIUM CHLORIDE 0.9 % IV SOLN
510.0000 mg | Freq: Once | INTRAVENOUS | Status: AC
  Administered 2018-08-27: 510 mg via INTRAVENOUS
  Filled 2018-08-27: qty 17

## 2018-08-27 MED ORDER — LORAZEPAM 1 MG PO TABS
1.0000 mg | ORAL_TABLET | Freq: Four times a day (QID) | ORAL | Status: DC | PRN
  Administered 2018-08-27: 1 mg via ORAL
  Filled 2018-08-27: qty 1

## 2018-08-27 MED ORDER — IPRATROPIUM-ALBUTEROL 0.5-2.5 (3) MG/3ML IN SOLN: 3.0000 mL | Freq: Four times a day (QID) | RESPIRATORY_TRACT | 0 refills | Status: DC | PRN

## 2018-08-27 MED ORDER — GADOBUTROL 1 MMOL/ML IV SOLN
9.0000 mL | Freq: Once | INTRAVENOUS | Status: AC | PRN
  Administered 2018-08-27: 9 mL via INTRAVENOUS

## 2018-08-27 MED ORDER — PREDNISONE 20 MG PO TABS: ORAL_TABLET | ORAL | 0 refills | Status: DC

## 2018-08-27 MED ORDER — ACETAMINOPHEN-CODEINE #3 300-30 MG PO TABS: 1.0000 | ORAL_TABLET | Freq: Four times a day (QID) | ORAL | 0 refills | Status: DC | PRN

## 2018-08-27 NOTE — Progress Notes (Addendum)
HEMATOLOGY-ONCOLOGY PROGRESS NOTE  SUBJECTIVE: Patient reports that she is feeling well today she is anxious to go home.  Denies chest pain, shortness of breath, cough.  Chest tube is still in place.  Chest x-ray was performed just before my visit.  REVIEW OF SYSTEMS:   Constitutional: Denies fevers, chills Eyes: Denies blurriness of vision Ears, nose, mouth, throat, and face: Denies mucositis or sore throat Respiratory: Denies cough, dyspnea or wheezes Cardiovascular: Denies palpitation, chest discomfort Gastrointestinal:  Denies nausea, heartburn or change in bowel habits Skin: Denies abnormal skin rashes Lymphatics: Denies new lymphadenopathy or easy bruising Neurological:Denies numbness, tingling or new weaknesses Behavioral/Psych: Mood is stable, no new changes  Extremities: No lower extremity edema All other systems were reviewed with the patient and are negative.  I have reviewed the past medical history, past surgical history, social history and family history with the patient and they are unchanged from previous note.   PHYSICAL EXAMINATION:  Vitals:   08/26/18 1957 08/27/18 0509  BP: (!) 144/66 130/63  Pulse: 88 69  Resp: 16 16  Temp: 98.4 F (36.9 C) 97.9 F (36.6 C)  SpO2: 96% 97%   Filed Weights   08/24/18 1147 08/25/18 0500 08/27/18 0500  Weight: 180 lb (81.6 kg) 180 lb 5.4 oz (81.8 kg) 182 lb 8.7 oz (82.8 kg)    Intake/Output from previous day: 07/30 0701 - 07/31 0700 In: 480 [P.O.:480] Out: 450 [Urine:300; Chest Tube:150]  GENERAL:alert, no distress and comfortable SKIN: skin color, texture, turgor are normal, no rashes or significant lesions EYES: normal, Conjunctiva are pink and non-injected, sclera clear OROPHARYNX:no exudate, no erythema and lips, buccal mucosa, and tongue normal  NECK: supple, thyroid normal size, non-tender, without nodularity LYMPH:  no palpable lymphadenopathy in the cervical, axillary or inguinal LUNGS: Improved aeration on  right.  HEART: regular rate & rhythm and no murmurs and no lower extremity edema ABDOMEN:abdomen soft, non-tender and normal bowel sounds Musculoskeletal:no cyanosis of digits and no clubbing  NEURO: alert & oriented x 3 with fluent speech, no focal motor/sensory deficits  LABORATORY DATA:  I have reviewed the data as listed CMP Latest Ref Rng & Units 07/30/2018 07/29/2018 03/06/2013  Glucose 70 - 99 mg/dL 135(H) 81 107(H)  BUN 6 - 20 mg/dL 12 6 7   Creatinine 0.44 - 1.00 mg/dL 0.47 0.45 0.52  Sodium 135 - 145 mmol/L 143 141 139  Potassium 3.5 - 5.1 mmol/L 4.4 3.8 3.8  Chloride 98 - 111 mmol/L 106 105 103  CO2 22 - 32 mmol/L 29 25 24   Calcium 8.9 - 10.3 mg/dL 9.3 8.8(L) 9.2  Total Protein 6.5 - 8.1 g/dL - 7.1 -  Total Bilirubin 0.3 - 1.2 mg/dL - 0.3 -  Alkaline Phos 38 - 126 U/L - 94 -  AST 15 - 41 U/L - 36 -  ALT 0 - 44 U/L - 34 -    Lab Results  Component Value Date   WBC 9.5 07/30/2018   HGB 9.3 (L) 07/30/2018   HCT 35.4 (L) 07/30/2018   MCV 77.1 (L) 07/30/2018   PLT 447 (H) 07/30/2018   NEUTROABS 4.1 07/29/2018    Ct Angio Chest Pe W Or Wo Contrast  Result Date: 07/29/2018 CLINICAL DATA:  53 year old female with chest pain and shortness of breath. Abnormal D-dimer. EXAM: CT ANGIOGRAPHY CHEST WITH CONTRAST TECHNIQUE: Multidetector CT imaging of the chest was performed using the standard protocol during bolus administration of intravenous contrast. Multiplanar CT image reconstructions and MIPs were obtained to evaluate  the vascular anatomy. CONTRAST:  125mL OMNIPAQUE IOHEXOL 350 MG/ML SOLN COMPARISON:  Portable chest earlier today. FINDINGS: Cardiovascular: Good contrast bolus timing in the pulmonary arterial tree. Mild respiratory motion. No focal filling defect identified in the pulmonary arteries to suggest acute pulmonary embolism. Cardiomegaly.  No pericardial effusion.  Negative visible aorta. Mediastinum/Nodes: Mediastinal and left hilar malignant appearing lymphadenopathy.  Prevascular abnormal nodes individually up to 27 millimeter short axis. Soft tissue encasing the left hilum (series 5, image 46). Right hilar lymph nodes remain normal. No lymphadenopathy identified in the visible lower neck. No axillary lymphadenopathy. Lungs/Pleura: Low lung volumes with diffuse crowding of markings and atelectasis. Mass effect on the airways at the left hilum, but the major airways remain patent. There is a small spiculated nodule in the lingula measuring about 9 millimeters on series 6, image 65. Otherwise only vague sub solid nodules are occasionally identified in the left lung. Some paraseptal emphysema in the contralateral right lung. Mild linear atelectasis or scarring in the lateral segment of the right middle lobe. No pleural effusion. Upper Abdomen: Negative visible liver, gallbladder, spleen, pancreas, stomach and kidneys. There is an oval 33 millimeter left adrenal nodule. This has low-density (4-5 Hounsfield units) but is indeterminate in this clinical setting. Musculoskeletal: Chronic or congenital fusion of the right lateral 1st and 2nd ribs. No acute or suspicious osseous lesion. Review of the MIP images confirms the above findings. IMPRESSION: 1. Malignant appearing left mediastinal and hilar lymphadenopathy narrowing the airways at the left hilum. In the left lung only a subtle 9 mm spiculated nodule is identified in the lingula. Consider small cell carcinoma. Bronchoscopy or mediastinal node sampling might be most valuable for diagnosis. 2. Indeterminate although low-density (which typically which indicates a benign adenoma) left adrenal nodule. 3.  Negative for acute pulmonary embolus. Electronically Signed   By: Genevie Ann M.D.   On: 07/29/2018 20:05   Ct Abdomen Pelvis W Contrast  Result Date: 08/26/2018 CLINICAL DATA:  Small-cell lung cancer in the lingula.  Staging. EXAM: CT ABDOMEN AND PELVIS WITH CONTRAST TECHNIQUE: Multidetector CT imaging of the abdomen and pelvis was  performed using the standard protocol following bolus administration of intravenous contrast. CONTRAST:  150mL OMNIPAQUE IOHEXOL 300 MG/ML SOLN, 92mL OMNIPAQUE IOHEXOL 300 MG/ML SOLN COMPARISON:  07/29/2018 FINDINGS: Lower chest: Heart appears enlarged. There is left base atelectasis with small left effusion. Left pleural catheter identified but incompletely visualized. There is some gas in the left chest wall consistent with the presence of the pleural drain. Hepatobiliary: No suspicious focal abnormality within the liver parenchyma. There is no evidence for gallstones, gallbladder wall thickening, or pericholecystic fluid. No intrahepatic or extrahepatic biliary dilation. Pancreas: No focal mass lesion. No dilatation of the main duct. No intraparenchymal cyst. No peripancreatic edema. Spleen: No splenomegaly. No focal mass lesion. Adrenals/Urinary Tract: Right adrenal gland unremarkable. 2.9 cm left adrenal nodule shows heterogeneous enhancement and a relative washout value of 34%, indeterminate for adenoma. Metastatic lesion not excluded. Kidneys unremarkable. No evidence for hydroureter. The urinary bladder appears normal for the degree of distention. Stomach/Bowel: Stomach is unremarkable. No gastric wall thickening. No evidence of outlet obstruction. Duodenum is normally positioned as is the ligament of Treitz. No small bowel wall thickening. No small bowel dilatation. The terminal ileum is normal. The appendix is normal. No gross colonic mass. No colonic wall thickening. Diverticular changes are noted in the left colon without evidence of diverticulitis. Vascular/Lymphatic: There is abdominal aortic atherosclerosis without aneurysm. There is no gastrohepatic  or hepatoduodenal ligament lymphadenopathy. No intraperitoneal or retroperitoneal lymphadenopathy. No pelvic sidewall lymphadenopathy. Reproductive: The uterus is unremarkable.  There is no adnexal mass. Other: No intraperitoneal free fluid.  Musculoskeletal: No worrisome lytic or sclerotic osseous abnormality. IMPRESSION: 1. 2.9 cm left adrenal nodule cannot be definitively characterized on this study. Potentially a lipid poor adenoma, but metastatic disease not excluded. MRI of the abdomen with and without contrast may prove helpful to further evaluate. 2. No other findings to suggest metastatic disease in the abdomen/pelvis. 3. Left base atelectasis with small left pleural effusion. Electronically Signed   By: Misty Stanley M.D.   On: 08/26/2018 16:43   Dg Chest Port 1 View  Result Date: 08/27/2018 CLINICAL DATA:  53 year old female with a history of pneumothorax and a left-sided chest tube in place EXAM: PORTABLE CHEST 1 VIEW COMPARISON:  Prior chest x-ray 08/26/2018 FINDINGS: Stable position of left apical pigtail thoracostomy tube. No evidence of recurrent pneumothorax. Persistent left upper lobe opacity likely reflecting collapse of the left upper lobe secondary to an underlying obstructing mass. The right lung remains clear. No acute osseous abnormality. IMPRESSION: 1. Stable position of left pigtail thoracostomy tube. 2. No evidence of recurrent pneumothorax. 3. Persistent left hilar obstructing mass and resultant left upper lobe collapse. Electronically Signed   By: Jacqulynn Cadet M.D.   On: 08/27/2018 10:08   Dg Chest Port 1 View  Result Date: 08/26/2018 CLINICAL DATA:  Follow-up pneumothorax EXAM: PORTABLE CHEST 1 VIEW COMPARISON:  August 26, 2018 8:27 a.m. FINDINGS: Again noted is a left-sided pigtail catheter. Left upper lobe collapse is again identified. There is slightly improved aeration of the remainder of the left lung. The right lung remains clear. The cardiomediastinal silhouette is unchanged. IMPRESSION: Interval slight improvement in left lung aeration with stable left upper lobe collapse Electronically Signed   By: Prudencio Pair M.D.   On: 08/26/2018 18:19   Dg Chest Port 1 View  Result Date: 08/26/2018 CLINICAL DATA:   Follow-up pneumothorax EXAM: PORTABLE CHEST 1 VIEW COMPARISON:  Yesterday FINDINGS: Left chest tube in stable position. Continued left upper lobe collapse. There is improving left lung aeration though. The right lung is clear. Cardiomegaly. IMPRESSION: Improved left-sided aeration but persistent left upper lobe collapse. No visible pneumothorax. Cardiomegaly. Electronically Signed   By: Monte Fantasia M.D.   On: 08/26/2018 10:58   Dg Chest Port 1 View  Result Date: 08/25/2018 CLINICAL DATA:  Pneumothorax. EXAM: PORTABLE CHEST 1 VIEW COMPARISON:  Chest x-rays dated 08/24/2018 FINDINGS: Left chest tube is unchanged in position. No visible residual pneumothorax. However the, there is increased volume loss and consolidation in the left upper and lower lung zones. Subcutaneous emphysema on the left has almost completely resolved. Chronic cardiomegaly. Right lung is clear. Pulmonary vascularity on the right is normal. No acute bone abnormality. 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 IMPRESSION: 1. Increasing volume loss and consolidation in the left upper and lower lung zones. 2. No residual pneumothorax. Electronically Signed   By: Lorriane Shire M.D.   On: 08/25/2018 08:20   Dg Chest Port 1 View  Result Date: 08/24/2018 CLINICAL DATA:  Chest tube placement EXAM: PORTABLE CHEST 1 VIEW COMPARISON:  Portable exam 1701 hours compared to 1552 hours FINDINGS: Interval placement of a pigtail LEFT thoracostomy tube. Resolution of previously identified large LEFT tension pneumothorax. Persistent atelectasis in LEFT upper lobe and portions of LEFT lower lobe. Resolution of mediastinal shift. Small amount of LEFT lateral chest wall gas. Heart  size upper normal. Subsegmental atelectasis RIGHT lung base. IMPRESSION: Resolution of LEFT tension pneumothorax following thoracostomy tube insertion. Persistent significant atelectasis of LEFT lung and minimal subsegmental atelectasis at RIGHT lung base.  Electronically Signed   By: Lavonia Dana M.D.   On: 08/24/2018 17:29   Dg Chest Port 1 View  Result Date: 08/24/2018 CLINICAL DATA:  Shortness of breath.  Status post bronchoscopy.  5 EXAM: PORTABLE CHEST 1 VIEW COMPARISON:  Chest x-ray dated 03/29/2018 FINDINGS: The patient has a large left tension pneumothorax with shift of the heart mediastinal structures to the right. There is chronic cardiomegaly. Pulmonary vascularity is normal. Right lung is clear. The left lung has almost completely collapsed. No acute bone abnormality. IMPRESSION: Large left tension pneumothorax. Critical Value/emergent results were called by telephone at the time of interpretation on 08/24/2018 at 4:36 pm to [place], [place] , who verbally acknowledged these results. Electronically Signed   By: Lorriane Shire M.D.   On: 08/24/2018 16:38   Dg Chest Port 1 View  Result Date: 07/29/2018 CLINICAL DATA:  Shortness of breath, cough EXAM: PORTABLE CHEST 1 VIEW COMPARISON:  03/06/2013 FINDINGS: There is no focal consolidation. There is no pleural effusion or pneumothorax. The cardiac silhouette is relatively enlarged which may be projectional given the AP view. There is no acute osseous abnormality. IMPRESSION: No active disease. Electronically Signed   By: Kathreen Devoid   On: 07/29/2018 12:12    ASSESSMENT AND PLAN: 1.  Small cell lung cancer 2.  COPD 3.  Tobacco dependence 4.  Acute left tension pneumothorax 5. Iron deficient anemia   -Discussed findings of CT of the abdomen pelvis with the patient.  Plan for PET scan as an outpatient.  MRI of the brain to be performed today. -We will plan for outpatient follow-up to begin chemotherapy next week.  Assuming she has limited stage disease, she will also receive radiation concurrently with her chemotherapy. -The patient has iron deficiency anemia and we will give her dose of Feraheme 510 mg prior to discharge today. -I have placed a referral to social work to assist with Medicaid  application, disability, and transportation.  I have also sent a message to our cancer center social worker for outpatient follow-up.  Okay for the patient discharged to home when medically stable.  Will arrange for outpatient follow-up at the cancer center.   LOS: 3 days   Mikey Bussing, DNP, AGPCNP-BC, AOCNP 08/27/18   Addendum  I have seen the patient, examined her. I agree with the assessment and and plan and have edited the notes.   Pt had chest tube removed around noon today, doing well overall. She may go home today. I reviewed her brain MRI and CT AP results, and will arrange out pt PET. We discussed her outpt f/u plan, she is in agreement. Will arrange.  [name]  08/27/2018

## 2018-08-27 NOTE — Discharge Summary (Addendum)
Physician Discharge Summary  Patient ID: Marissa [name] MRN: [id number] DOB/AGE: 1965/10/11 53 y.o.  Admit date: 08/24/2018 Discharge date: 08/27/2018    Discharge Diagnoses:  Left Upper Lobe Obstructive Lung Mass s/p EBUS (7/28) with Pathology consistent with Small Cell Mediastinal Lymphadenopathy  Left Tension Pneumothorax Acute Hypoxemic Respiratory Failure  COPD                                                                      DISCHARGE PLAN BY DIAGNOSIS      Left Upper Lobe Obstructive Lung Mass s/p EBUS (7/28) with Pathology consistent with Small Cell Mediastinal Lymphadenopathy   Discharge Plan: Follow up with Dr. Loanne Drilling on 8/3 as arranged  Follow up with Dr. Burr Medico as planned and with Radiation ONC Will need follow up PET imaging for distant metastasis surveillance   Left Tension Pneumothorax  Discharge Plan: Resolved, chest tube removed.   No submersion in water.  Keep site clean / dry.  Do not remove dressing for 48 hours.   Ok to shower 8/3 Pt instructed to return to ER immediately if new SOB, chest pain or worsening symptoms  Acute Hypoxemic Respiratory Failure  COPD   Discharge Plan: Prednisone taper to off  Continue symbicort, spiriva and duoneb  Smoking cessation counseling completed  Nicotine patch for smoking cessation   IDA  Discharge Plan: Treated with Feraheme while inpatient, defer further follow up to [place]   53 y/o F, smoker, with a PMH of microcytic anemia, COPD admitted on 7/28 for planned EBUS to evaluate a LUL lung mass and mediastinal lymphadenopathy.    She was previously admitted in early July 2020 with progressive shortness of breath.  CT imaging at that time showed a malignant appearing left mediastinal & hilar lymphadenopathy.  Additionally, CT showed a narrowing of the airways at the left hilum and a 33m spiculated nodule in the lingula.  The patient underwent transbronchial needle aspiration  and Wang needle biopsy of endobronchial lesion.  Post procedure CXR demonstrated a large left pneumothorax that required urgent chest tube placement. A small bore chest tube was placed with resolution of pneumothorax.  The patient was admitted for monitoring with pneumothorax and pain control.  Pain control was achieved with IV medication and she was transitioned to oral prior to discharge.  Bronchial brushings of the LUL mass were consistent with small cell carcinoma.  Lymph node biopsy did not show evidence of malignancy. The patient was evaluated by Oncology (Dr. FBurr Medico prior to discharge with tentative plans of beginning concurrent chemo + radiation with cisplatin and etoposide with curative intent (assuming no distant metastasis).   She was treated with feraheme for IDA.  Pneumothorax resolved and the chest tube was placed to water seal.  It was subsequently removed on 7/30.  The patient was medically cleared for discharge on 7/31 with plans as above.          SIGNIFICANT DIAGNOSTIC STUDIES EBUS 7/28   TUBES / LINES Left Chest Tube 7/28 >> 7/30   Discharge Exam: General: adult female sitting up in bed in NAD Neuro: AAOx4, speech clear, MAE  PULM: normal work  of breathing, diminished on left  GI: abd round / soft Extremities: warm/dry, +pulses  Vitals:   08/26/18 1200 08/26/18 1957 08/27/18 0500 08/27/18 0509  BP:  (!) 144/66  130/63  Pulse:  88  69  Resp:  16  16  Temp: 98.2 F (36.8 C) 98.4 F (36.9 C)  97.9 F (36.6 C)  TempSrc: Oral Oral    SpO2:  96%  97%  Weight:   82.8 kg   Height:         Discharge Labs  BMET No results for input(s): NA, K, CL, CO2, GLUCOSE, BUN, CREATININE, CALCIUM, MG, PHOS in the last 168 hours.  CBC No results for input(s): HGB, HCT, WBC, PLT in the last 168 hours.  Anti-Coagulation Recent Labs  Lab 08/24/18 1720  INR 1.0    Allergies as of 08/27/2018      Reactions   Penicillins Nausea And Vomiting   Did it involve swelling of the  face/tongue/throat, SOB, or low BP? No Did it involve sudden or severe rash/hives, skin peeling, or any reaction on the inside of your mouth or nose? No Did you need to seek medical attention at a hospital or doctor's office? No When did it last happen?20 years If all above answers are "NO", may proceed with cephalosporin use.      Medication List    STOP taking these medications   ipratropium 17 MCG/ACT inhaler Commonly known as: ATROVENT HFA     TAKE these medications   acetaminophen-codeine 300-30 MG tablet Commonly known as: TYLENOL #3 Take 1 tablet by mouth every 6 (six) hours as needed for up to 5 days for moderate pain.   budesonide-formoterol 160-4.5 MCG/ACT inhaler Commonly known as: Symbicort Inhale 2 puffs into the lungs 2 (two) times daily.   butalbital-acetaminophen-caffeine 50-325-40 MG tablet Commonly known as: FIORICET Take 1 tablet by mouth every 6 (six) hours as needed for headache. Patient will pick up scripts today.   diazepam 10 MG tablet Commonly known as: VALIUM Take 10 mg by mouth daily as needed for anxiety.   ipratropium-albuterol 0.5-2.5 (3) MG/3ML Soln Commonly known as: DUONEB Take 3 mLs by nebulization every 6 (six) hours as needed. What changed:   when to take this  reasons to take this   nicotine 21 mg/24hr patch Commonly known as: NICODERM CQ - dosed in mg/24 hours Place 1 patch (21 mg total) onto the skin daily. Patient will pick up scripts today.   predniSONE 20 MG tablet Commonly known as: DELTASONE 2 tabs daily for 2 days, then 1 tab daily for 2 days, then 1/2 tab daily for 2 days   Spiriva Respimat 2.5 MCG/ACT Aers Generic drug: Tiotropium Bromide Monohydrate Inhale 2 puffs into the lungs daily. What changed: Another medication with the same name was removed. Continue taking this medication, and follow the directions you see here.   traZODone 100 MG tablet Commonly known as: DESYREL Take 1 tablet (100 mg total) by  mouth at bedtime. Patient will pick up scripts today.       Disposition: Home.  Social Work consulted while inpatient as patient does not have insurance, limited resources and no car.  Will need to arrange for transportation to chemo etc.   Discharged Condition: Marissa [name] has met maximum benefit of inpatient care and is medically stable and cleared for discharge.  Patient is pending follow up as above.      Time spent on disposition:  32 Minutes.   Signed: Noe Gens,  NP-C [facility] Pgr: [phone] Office: [phone]  I examined the patient on the morning of discharge and noted marked improvement in air movement but still episodes of cough which were mostly dry and should be suppressed since she is just recovering from a pneumothorax.  I recommended the combination of Mucinex DM over-the-counter anti-#3 for both chest discomfort and cough and close follow-up in the office by Dr. Loanne Drilling as planned.  She also has appointments to follow-up with both oncology and radiation oncology.  Each maintenance medication was reviewed in detail including most importantly the difference between maintenance and as needed and under what circumstances the prns are to be used.    Christinia Gully, MD Pulmonary and [place] [phone] After 5:30 PM or weekends, use [facility] [phone]

## 2018-08-27 NOTE — Progress Notes (Signed)
Patient Saturations on 3 Liters of oxygen while Ambulating is 96%

## 2018-08-27 NOTE — Progress Notes (Signed)
START ON PATHWAY REGIMEN - Small Cell Lung     A cycle is every 21 days:     Etoposide      Cisplatin   **Always confirm dose/schedule in your pharmacy ordering system**  Patient Characteristics: Newly Diagnosed, Preoperative or Nonsurgical Candidate (Clinical Staging), First Line, Limited Stage, Nonsurgical Candidate Therapeutic Status: Newly Diagnosed, Preoperative or Nonsurgical Candidate (Clinical Staging) AJCC T Category: cT1c AJCC N Category: cN2 AJCC M Category: cM0 AJCC 8 Stage Grouping: IIIA Stage Classification: Limited Surgical Candidacy: Nonsurgical Candidate Intent of Therapy: Curative Intent, Discussed with Patient

## 2018-08-27 NOTE — Progress Notes (Signed)
Chest tube removed at bedside.  Occlusive dressing applied to site.  Reviewed instructions with patient for site care.  Follow up CXR ordered for 1600.    Noe Gens, NP-C [facility] Pgr: [phone] or if no answer [phone] 08/27/2018, 12:37 PM

## 2018-08-30 ENCOUNTER — Telehealth: Payer: Self-pay | Admitting: Hematology

## 2018-08-30 ENCOUNTER — Ambulatory Visit: Payer: Self-pay | Admitting: Pulmonary Disease

## 2018-08-30 ENCOUNTER — Telehealth: Payer: Self-pay | Admitting: *Deleted

## 2018-08-30 ENCOUNTER — Telehealth: Payer: Self-pay | Admitting: Pulmonary Disease

## 2018-08-30 NOTE — Telephone Encounter (Signed)
Oncology Nurse Navigator Documentation  Oncology Nurse Navigator Flowsheets 08/30/2018  Navigator Location [facility]  Navigator Encounter Type Telephone/I called and updated patient on her appt for her PET scan on 09/02/2018 arrive at 1:30 with nothing to eat or drink prior to scan.  She verbalized understanding of instructions.    Telephone Outgoing Call  Treatment Phase Pre-Tx/Tx Discussion  Barriers/Navigation Needs Coordination of Care;Education  Education Other  Interventions Coordination of Care  Coordination of Care Other  Acuity Level 2  Time Spent with Patient 30

## 2018-08-30 NOTE — Telephone Encounter (Signed)
That is what the tylenol #3 was for so can take up to 2 every 4 hours and if not adequate let us know - it works for cough or pain

## 2018-08-30 NOTE — Telephone Encounter (Signed)
Called and spoke with pt letting her know that the Tylenol #3 that was prescribed was to help with both cough and pain. Pt said that the med is not even touching her pain and is requesting for something else to be prescribed.

## 2018-08-30 NOTE — Telephone Encounter (Signed)
This patient was seen by Dr. Melvyn Novas  In hospital  Dr. Melvyn Novas  Can you take a look at this

## 2018-08-30 NOTE — Telephone Encounter (Signed)
Called and spoke with pt who states she is having pain in her shoulder and under her left breast all the way to the incision that is where she had a tube placed where her lung had collapsed.  Pt said her procedure was 7/28 and stated she just was released from hospital 7/31.  Pt also said that her left side is really bothering her.  Due to pt's pain, she is wanting to know if there is anythng that could be prescribed to help with the pain. On a scale of 0-10 with 0 being no pain, pt said her pain is up to about a 15. Tammy, please advise on this for pt. Thanks!

## 2018-08-30 NOTE — Telephone Encounter (Signed)
Added patient education and 1st cycle of chemo per staff message from [place]. Per YF check back with her later today or tomorrow for date/time for hosp fu due to patient needs pet 1st. Patient will be contacted once YF provides date/time for hosp fu.

## 2018-08-31 ENCOUNTER — Telehealth: Payer: Self-pay | Admitting: Internal Medicine

## 2018-08-31 ENCOUNTER — Encounter: Payer: Self-pay | Admitting: *Deleted

## 2018-08-31 ENCOUNTER — Encounter: Payer: Self-pay | Admitting: General Practice

## 2018-08-31 ENCOUNTER — Telehealth: Payer: Self-pay | Admitting: Hematology

## 2018-08-31 MED ORDER — OXYCODONE HCL 5 MG PO CAPS: ORAL_CAPSULE | ORAL | 0 refills | Status: DC

## 2018-08-31 MED ORDER — OXYCODONE HCL 5 MG PO TABS: ORAL_TABLET | ORAL | 0 refills | Status: DC

## 2018-08-31 NOTE — Telephone Encounter (Signed)
Called and spoke with pt letting her know that MW has fixed the oxycodone Rx and [facility]. Pt verbalized understanding. Nothing further needed.

## 2018-08-31 NOTE — Telephone Encounter (Signed)
Having gen cp on same side as Small cell ca with at of LUL not resp to tyl #3  rec oxyir 5 mg 1-2 q 4 h prn and let oncology take over pain management once establishes with them

## 2018-08-31 NOTE — Telephone Encounter (Signed)
Called and spoke with pt letting her know that the oxycodone had been sent to [facility] for her by [facility] and pt then stated that she was told by [facility] they did not have any oxycodone. Pt said she contacted MW and he told her that he would have to call [facility] to verify this before he could send the Rx to a different pharmacy for her.  I stated to pt that she needed to contact the main office number and not contact MW on his personal cell and she had me verify with her the office phone number. Pt said after we did speak with [facility] about the oxycodone if they in fact did not have any, the pharmacy she was needing the oxycodone to be sent to instead [facility] [address]  Attempted to call [place] to verify that they did not have any oxycodone in stock to be able to get pt's Rx taken care of but the pharmacy was currently closed and won't reopen until 2pm. Will hold in triage so we can call back after 2pm.

## 2018-08-31 NOTE — Telephone Encounter (Signed)
Pt called Dr Gustavus Bryant personal cell and told him to cancel the rx and send somewhere else  Not sure where she wants this sent  Need to find out where she wants this sent and cancel rx at any other pharmacy  Pt needs to be told that all calls need to come through our office and not Dr Gustavus Bryant personal cell phone

## 2018-08-31 NOTE — Telephone Encounter (Signed)
[facility] and spoke with [place] to verify that they did not have any oxycodone 5mg  capsule in stock and he verified that they do not.  The new pharmacy that pt's Rx will need to be sent [facility] [place] [address] [facility] not [facility] any capsules but they have plenty of tablets. Called and spoke with pt letting her know [facility] and not [facility] and asked her if it would be fine for the Rx to be tablets and pt said that would be fine.   After getting everything clarified with pharmacies and pt, the oxycodone will need to be sent in as a tablet form not capsule and the pharmacy that the Rx now needs to be sent to is [facility] [address] [place].

## 2018-08-31 NOTE — Progress Notes (Signed)
[place] CSW Progress Notes  Email sent to inpatient financial advocates to determine status of Medicaid and/or disability applications.  Awaiting reply.  [place] aware of need and will assist w appropriate [facility] resources when patient establishes for care at [facility].    Edwyna Shell, LCSW Clinical Social Worker Phone:  [phone]

## 2018-08-31 NOTE — Telephone Encounter (Signed)
[facility] [place] > done

## 2018-08-31 NOTE — Telephone Encounter (Signed)
MW saw pt while she was in the hospital. This is the reason why he authorized the oxycodone med.  [place] and spoke with [place] who stated that they do not carry narcotics at their pharmacy and the med would need to be sent to pt's other pharmacy listed of [facility].   I verbalized understanding and also mentioned the note we received about trying to figure out if it was wrong provider for the med and stated to [facility] that [place] saw pt while she was in the hospital and since he saw her due to prev pain med not working, he changed the med. Claiborne Billings verbalized understanding and stated she would let the pharmacist know.  Dr. Melvyn Novas, can you please send pt's oxycodone 5mg  Rx to [facility] due to [place] not able to fill med.

## 2018-08-31 NOTE — Telephone Encounter (Signed)
Confirmed 8/7 appointment with SW and YF. Patient aware YF will talk with her re upcoming appointments after f/u. Patient will get schedule at 8/7 visit.

## 2018-08-31 NOTE — Progress Notes (Signed)
Marissa [name]  Clinical Social [name] received referral from medical oncology for financial needs.  CSW contacted patient at home to offer support and assess for needs.  Patient stated she was uninsured and unemployed.  Patient reported that she was not seen by a financial advocate in the hospital to discuss medicaid or disability.  CSW and patient discussed social security disability and she was agreeable to a servant center referral.  [facility] completed referral and servant center will contact patient to begin the application process.  Patient is scheduled to see a member of the [facility] team when she is at the cancer center on 8/7 to discuss additional resources.     Johnnye Lana, MSW, LCSW, OSW-C Clinical Social Worker [facility] [phone]

## 2018-09-01 NOTE — Progress Notes (Signed)
Marissa [name]   [phone] [phone]   Clinic Follow up Note   Patient Care Team: Patient, No Pcp Per as PCP - General (General Practice)  Date of Service:  09/03/2018  CHIEF COMPLAINT: F/u of Small Cell Lung Cancer   SUMMARY OF ONCOLOGIC HISTORY: Oncology History  Small cell lung cancer, left ([place])  07/29/2018 Imaging   CT Angio Chest 07/29/18  IMPRESSION: 1. Malignant appearing left mediastinal and hilar lymphadenopathy narrowing the airways at the left hilum. In the left lung only a subtle 9 mm spiculated nodule is identified in the lingula. Consider small cell carcinoma. Bronchoscopy or mediastinal node sampling might be most valuable for diagnosis. 2. Indeterminate although low-density (which typically which indicates a benign adenoma) left adrenal nodule. 3.  Negative for acute pulmonary embolus.   08/24/2018 Definitive Surgery   Bronchoscopy and EBUS biopsy on 08/24/18 by Dr. Loanne Drilling   08/24/2018 Initial Biopsy   Diagnosis 08/24/18 FINE NEEDLE ASPIRATION, ENDOSCOPIC, (EBUS) STATION # 7 LYMPH NODE(SPECIMEN 1 OF 4 COLLECTED 08/24/18): NO MALIGNANT CELLS IDENTIFIED.  FINE NEEDLE ASPIRATION, ENDOSCOPIC, (EBUS) LEFT HILAR LYMPH NODE(SPECIMEN 2 OF 4 COLLECTED 08/24/18): NO MALIGNANT CELLS IDENTIFIED.  TRANSBRONCHIAL NEEDLE ASPIRATION, WANG, (SPECIMEN 3 OF 4 COLLECTED 08/24/18): NO MALIGNANT CELLS IDENTIFIED.  BRONCHIAL BRUSHING, LUL (SPECIMEN 4 OF 4 COLLECTED 08/24/18): MALIGNANT CELLS PRESENT, MOST CONSISTENT WITH SMALL CELL CARCINOMA. SEE COMMENT.   08/26/2018 Initial Diagnosis   Small cell lung cancer, left ([place])   08/26/2018 Imaging   CT AP W Contrast 08/26/18  IMPRESSION: 1. 2.9 cm left adrenal nodule cannot be definitively characterized on this study. Potentially a lipid poor adenoma, but metastatic disease not excluded. MRI of the abdomen with and without contrast may prove helpful to further evaluate. 2. No other findings to suggest  metastatic disease in the abdomen/pelvis. 3. Left base atelectasis with small left pleural effusion.   08/27/2018 Imaging   MRI brain 08/27/18  IMPRESSION: 1. Motion degraded, incomplete examination. 2. Subcentimeter focus of diffusion signal abnormality in the left cerebellum. No enhancement is evident to strongly suggest a metastasis, and this may reflect an acute to subacute small vessel infarct. 3. No evidence of intracranial metastatic disease elsewhere. 4. Given motion artifact and incomplete postcontrast imaging, consider short-term follow-up MRI (possibly with sedation) to further evaluate the left cerebellar abnormality and provide a more thorough evaluation for metastatic disease.   09/02/2018 PET scan   IMPRESSION: 1. Hypermetabolic left hilar mass is identified encasing the left mainstem bronchus resulting in postobstructive atelectasis in the left upper lobe. 2. Hypermetabolic left mediastinal nodal metastasis. 3. Loculated left pleural effusion is identified with mild increased FDG uptake. Cannot rule out malignant pleural effusion. 4. No evidence for metastatic disease to the abdomen or pelvis or skeletal structures.      Chemotherapy   Concurrent chemoRT with IV cisplatin on day 1 and etopside day 1-3 for every 3 weeks starting 8/11. Will start radiation with Dr. Lisbeth Renshaw with cycle 2.     Radiation Therapy   Concurrent chemoRT with Dr. Lisbeth Renshaw. Start with cycle 2 chemo.       CURRENT THERAPY:  Concurrent chemoRT with IV cisplatin on day 1 and etopside day 1-3 for every 3 weeks starting 8/11. Will start radiation with cycle 2.    INTERVAL HISTORY:  Marissa [name] is here for a follow up as outpatient. She presents to the clinic alone. She notes she is doing better since hospital discharge. She notes significant LUQ abdominal and flank pain  in front of where her draining chest tube was placed. This pain brings tears to her eyes at times. She has been taking oxycodone  34m q4hours.   REVIEW OF SYSTEMS:   Constitutional: Denies fevers, chills or abnormal weight loss Eyes: Denies blurriness of vision Ears, nose, mouth, throat, and face: Denies mucositis or sore throat Respiratory: Denies cough, dyspnea or wheezes (+) on continuous O2 canula Cardiovascular: Denies palpitation, chest discomfort or lower extremity swelling Gastrointestinal:  Denies nausea, heartburn or change in bowel habits (+) significant LUQ abdominal pain Skin: Denies abnormal skin rashes Lymphatics: Denies new lymphadenopathy or easy bruising Neurological:Denies numbness, tingling or new weaknesses Behavioral/Psych: Mood is stable, no new changes  All other systems were reviewed with the patient and are negative.  MEDICAL HISTORY:  Past Medical History:  Diagnosis Date   Asthma    Chronic headaches    COPD (chronic obstructive pulmonary disease) ([place]    Depression    Heart murmur     SURGICAL HISTORY: Past Surgical History:  Procedure Laterality Date   BRONCHIAL BIOPSY  08/24/2018   Procedure: BRONCHIAL BIOPSIES;  Surgeon: EMargaretha Seeds MD;  [facility];  Service: Cardiopulmonary;;   BRONCHIAL BRUSHINGS  08/24/2018   Procedure: BRONCHIAL BRUSHINGS;  Surgeon: EMargaretha Seeds MD;  [facility];  Service: Cardiopulmonary;;   BRONCHIAL NEEDLE ASPIRATION BIOPSY  08/24/2018   Procedure: BRONCHIAL NEEDLE ASPIRATION BIOPSIES;  Surgeon: EMargaretha Seeds MD;  [facility];  Service: Cardiopulmonary;;   ENDOBRONCHIAL ULTRASOUND N/A 08/24/2018   Procedure: ENDOBRONCHIAL ULTRASOUND;  Surgeon: EMargaretha Seeds MD;  [facility];  Service: Cardiopulmonary;  Laterality: N/A;   TUBAL LIGATION     VIDEO BRONCHOSCOPY  08/24/2018   Procedure: VIDEO BRONCHOSCOPY;  Surgeon: EMargaretha Seeds MD;  [facility];  Service: Cardiopulmonary;;    I have reviewed the social history and family history with the patient and they are  unchanged from previous note.  ALLERGIES:  is allergic to penicillins.  MEDICATIONS:  Current Outpatient Medications  Medication Sig Dispense Refill   butalbital-acetaminophen-caffeine (FIORICET) 50-325-40 MG tablet Take 1 tablet by mouth every 6 (six) hours as needed for headache. Patient will pick up scripts today. 60 tablet 3   diazepam (VALIUM) 10 MG tablet Take 10 mg by mouth daily as needed for anxiety.     ipratropium-albuterol (DUONEB) 0.5-2.5 (3) MG/3ML SOLN Take 3 mLs by nebulization every 6 (six) hours as needed. 360 mL 0   ondansetron (ZOFRAN) 8 MG tablet Take 1 tablet (8 mg total) by mouth 2 (two) times daily as needed. Start on the third day after cisplatin chemotherapy. 30 tablet 1   oxyCODONE (OXY IR/ROXICODONE) 5 MG immediate release tablet 1-2 every 4 hrs as needed for pain 60 tablet 0   predniSONE (DELTASONE) 20 MG tablet 2 tabs daily for 2 days, then 1 tab daily for 2 days, then 1/2 tab daily for 2 days 7 tablet 0   prochlorperazine (COMPAZINE) 10 MG tablet Take 1 tablet (10 mg total) by mouth every 6 (six) hours as needed (Nausea or vomiting). 30 tablet 1   traZODone (DESYREL) 100 MG tablet Take 1 tablet (100 mg total) by mouth at bedtime. Patient will pick up scripts today. 90 tablet 2   budesonide-formoterol (SYMBICORT) 160-4.5 MCG/ACT inhaler Inhale 2 puffs into the lungs 2 (two) times daily. (Patient not taking: Reported on 09/03/2018) 1 Inhaler 0   gabapentin (NEURONTIN) 300 MG capsule Take 1 capsule (300 mg total) by mouth  3 (three) times daily. Take once at night for first 5 days then twice daily for day 6-10, before increase to three times daily 90 capsule 0   nicotine (NICODERM CQ - DOSED IN MG/24 HOURS) 21 mg/24hr patch Place 1 patch (21 mg total) onto the skin daily. Patient will pick up scripts today. 42 patch 0   Tiotropium Bromide Monohydrate (SPIRIVA RESPIMAT) 2.5 MCG/ACT AERS Inhale 2 puffs into the lungs daily. (Patient not taking: Reported on  09/03/2018) 4 g 0   No current facility-administered medications for this visit.     PHYSICAL EXAMINATION: ECOG PERFORMANCE STATUS: 2 - Symptomatic, <50% confined to bed  Vitals:   09/03/18 1544  BP: 133/69  Pulse: (!) 114  Resp: 18  Temp: 98.5 F (36.9 C)  SpO2: 99%   Filed Weights   09/03/18 1544  Weight: 178 lb 8 oz (81 kg)    GENERAL:alert, no distress and comfortable SKIN: skin color, texture, turgor are normal, no rashes or significant lesions EYES: normal, Conjunctiva are pink and non-injected, sclera clear  NECK: supple, thyroid normal size, non-tender, without nodularity LYMPH:  no palpable lymphadenopathy in the cervical, axillary  LUNGS: clear to auscultation and percussion with normal breathing effort HEART: regular rate & rhythm and no murmurs and no lower extremity edema ABDOMEN:abdomen soft, non-tender and normal bowel sounds Musculoskeletal:no cyanosis of digits and no clubbing  NEURO: alert & oriented x 3 with fluent speech, no focal motor/sensory deficits  LABORATORY DATA:  I have reviewed the data as listed CBC Latest Ref Rng & Units 07/30/2018 07/29/2018 03/06/2013  WBC 4.0 - 10.5 K/uL 9.5 6.4 7.3  Hemoglobin 12.0 - 15.0 g/dL 9.3(L) 9.4(L) 15.0  Hematocrit 36.0 - 46.0 % 35.4(L) 36.1 45.8  Platelets 150 - 400 K/uL 447(H) 476(H) 367     CMP Latest Ref Rng & Units 07/30/2018 07/29/2018 03/06/2013  Glucose 70 - 99 mg/dL 135(H) 81 107(H)  BUN 6 - 20 mg/dL '12 6 7  ' Creatinine 0.44 - 1.00 mg/dL 0.47 0.45 0.52  Sodium 135 - 145 mmol/L 143 141 139  Potassium 3.5 - 5.1 mmol/L 4.4 3.8 3.8  Chloride 98 - 111 mmol/L 106 105 103  CO2 22 - 32 mmol/L '29 25 24  ' Calcium 8.9 - 10.3 mg/dL 9.3 8.8(L) 9.2  Total Protein 6.5 - 8.1 g/dL - 7.1 -  Total Bilirubin 0.3 - 1.2 mg/dL - 0.3 -  Alkaline Phos 38 - 126 U/L - 94 -  AST 15 - 41 U/L - 36 -  ALT 0 - 44 U/L - 34 -      RADIOGRAPHIC STUDIES: I have personally reviewed the radiological images as listed and agreed with the  findings in the report. Nm Pet Image Restag (ps) Skull Base To Thigh  Result Date: 09/03/2018 CLINICAL DATA:  Initial treatment strategy for Lung cancer, small cell. EXAM: NUCLEAR MEDICINE PET SKULL BASE TO THIGH TECHNIQUE: 9.1 mCi F-18 FDG was injected intravenously. Full-ring PET imaging was performed from the skull base to thigh after the radiotracer. CT data was obtained and used for attenuation correction and anatomic localization. Fasting blood glucose: 95 mg/dl COMPARISON:  CT chest 07/29/2018 FINDINGS: Mediastinal blood pool activity: SUV max 2.55 Liver activity: SUV max NA NECK: No hypermetabolic lymph nodes in the neck. Asymmetric increased uptake to the left mandible is identified within SUV max of 8.02. On the corresponding CT images there is what appears to be a large dental cavity and possible periapical abscess. Incidental CT findings: none CHEST:  Left hilar lung mass is identified the measuring approximately 5 cm with SUV max of 14.1 Simmons. There is encasement and narrowing of the distal left mainstem bronchus. Postobstructive atelectasis involving the anteromedial left upper lobe is identified. Large left-sided pre-vascular nodal mass measures 4.3 x 5.8 cm with SUV max of 14.17. Hypermetabolic left paratracheal lymph node measures 1.4 cm and has an SUV max of 13.4. Within the superior mediastinum there is a left-sided pre-vascular node measuring 1.6 cm with SUV max of 7.07. No FDG avid subcarinal, right paratracheal or right hilar lymph nodes identified. Morphologically benign appearing bilateral axillary lymph nodes exhibit mild FDG uptake. For example, a 9 mm left axillary lymph node with benign fatty hilum has an SUV max of 2.8. 8 mm right axillary lymph node with preservation of fatty hilum has a SUV max of 3.8. Loculated left pleural effusion extends along the oblique fissure and over the left apex. Increased FDG uptake within the dependent portion of the effusion has increased FDG uptake  with SUV max of 3.65 concerning for malignant effusion. Tiny nodule in the right apex measures 2 mm is too small to characterize by PET-CT. Incidental CT findings: Aortic atherosclerosis. ABDOMEN/PELVIS: No abnormal hypermetabolic activity within the liver, pancreas, adrenal glands, or spleen. No hypermetabolic lymph nodes in the abdomen or pelvis. Incidental CT findings: Nodule in the left adrenal gland measures 2.9 x 2.0 cm with SUV max of 2.76. This measures -1.38 mean Hounsfield units and is favored to represent a benign adenoma. SKELETON: No focal hypermetabolic activity to suggest skeletal metastasis. Incidental CT findings: none IMPRESSION: 1. Hypermetabolic left hilar mass is identified encasing the left mainstem bronchus resulting in postobstructive atelectasis in the left upper lobe. 2. Hypermetabolic left mediastinal nodal metastasis. 3. Loculated left pleural effusion is identified with mild increased FDG uptake. Cannot rule out malignant pleural effusion. 4. No evidence for metastatic disease to the abdomen or pelvis or skeletal structures. Electronically Signed   By: Kerby Moors M.D.   On: 09/03/2018 08:28     ASSESSMENT & PLAN:  Marissa [name] is a 53 y.o. female with   1. Small cell lung cancer, stage III, limited stage  -Recently diagnosed in 07/2018. Imaging showed 42m nodule of left lung with malignant appearing left mediastinal and hilar lymphadenopathy. Her Bronchoscopy biopsy confirmed mass to be small cell lung cancer while her left hilar and mediastinal lymph node biopsy were negative for malignant cells.  -CT AP and MRI brain negative for distant metastasis.  -I personally reviewed her PET scan from 09/02/18 which shows Hypermetabolic left mediastinal nodal metastasis, but no distant metastasis.  -Given her locally advanced disease (stage III cancer). Standard treatment includes concurrent chemoRT. Plan to start on 8/11 with IV chemo cisplatin on day 1 and etopside day 1-3 for every 3  weeks. Will start radiation with Dr. MLisbeth Renshawwith cycle 2 chemo. She is agreeable.  --Chemotherapy consent: Side effects including but does not not limited to, fatigue, nausea, vomiting, diarrhea, hair loss, neuropathy, fluid retention, renal and kidney dysfunction, neutropenic fever, needed for blood transfusion, bleeding, were discussed with patient in great detail. She agrees to proceed. -the goal of care is curative.  -we discussed high risk of recurrent after chemo and radiation, and surveillance plan. I recommend prophylactic cranial radiation after she completes chemo and thoracic radiation. -She will proceed with lab and chemo class on 8/10.  -F/u in 2 weeks    2. Smoking Cessation, COPD, recent acute left tension pneumothorax -She has  a h/o heavy smoking.  -She has COPD. She had recent execration with acute left tension pneumothorax in 07/2018.  -She underwent bronchoscopy and EBUS biopsy on 7/28 and was admitted after procedure for pneumothorax. Now resolved.  -She is currently on continuous canula oxygen, her breathing has improved.    3. Severe LUQ and left flank pain  -She had chest tube placed on left upper back S/p removal she has had severe radiating LUQ pain.  -She has been taking oxycodone 71m q4hours. I discussed with long term use she will become dependent at this frequency, she understands.  -Given this could be nerve damage causing her pain, I will call in gabapentin 3059m(09/03/18). I reviewed side effects of drowsiness. She will start once nightly for a week and can titrate up to TID if tolerable.      4. Iron deficient anemia -Developed during recent hospitalization.  -Treated with IV Iron on 7/3 and 7/31.  -Will monitor.   5. Financial Support  -Her medicaid is currently pending.  -She will meet with her financial advocate Shauna today  -She will also meet with [facility] financial office.    PLAN:  -I called in Gabapentin today -Lab and Chemo education class on  8/10 -Chemo Cisplatin day 1, etopside day 1-3 next week starting 8/11, with GCSF on day 5 -she met our financial advocate today -Lab and f/u in 2 weeks    No problem-specific Assessment & Plan notes found for this encounter.   No orders of the defined types were placed in this encounter.  All questions were answered. The patient knows to call the clinic with any problems, questions or concerns. No barriers to learning was detected. I spent 30 minutes counseling the patient face to face. The total time spent in the appointment was 40 minutes and more than 50% was on counseling and review of test results     YaTruitt MerleMD 09/03/2018   I, AmJoslyn Devonam acting as scribe for YaTruitt MerleMD.   I have reviewed the above documentation for accuracy and completeness, and I agree with the above.

## 2018-09-02 ENCOUNTER — Encounter (HOSPITAL_COMMUNITY)
Admission: RE | Admit: 2018-09-02 | Discharge: 2018-09-02 | Disposition: A | Discharge status: Home / Self Care | Payer: Medicaid Other | Source: Ambulatory Visit | Attending: Hematology | Admitting: Hematology

## 2018-09-02 ENCOUNTER — Other Ambulatory Visit: Payer: Self-pay | Admitting: Radiation Oncology

## 2018-09-02 ENCOUNTER — Other Ambulatory Visit: Payer: Self-pay

## 2018-09-02 DIAGNOSIS — C3490 Malignant neoplasm of unspecified part of unspecified bronchus or lung: Secondary | ICD-10-CM | POA: Diagnosis not present

## 2018-09-02 LAB — GLUCOSE, CAPILLARY: Glucose-Capillary: 96 mg/dL (ref 70–99)

## 2018-09-02 MED ORDER — FLUDEOXYGLUCOSE F - 18 (FDG) INJECTION
9.1000 | Freq: Once | INTRAVENOUS | Status: AC | PRN
  Administered 2018-09-02: 9.1 via INTRAVENOUS

## 2018-09-02 MED FILL — BUTALB-ACETAMIN-CAFF 50-325: 50-325-40 | 15 days supply | Qty: 60 | Fill #1

## 2018-09-03 ENCOUNTER — Inpatient Hospital Stay: Payer: Medicaid Other | Attending: Hematology | Admitting: Hematology

## 2018-09-03 ENCOUNTER — Other Ambulatory Visit: Payer: Self-pay

## 2018-09-03 ENCOUNTER — Encounter: Payer: Self-pay | Admitting: General Practice

## 2018-09-03 ENCOUNTER — Encounter: Payer: Self-pay | Admitting: Hematology

## 2018-09-03 ENCOUNTER — Inpatient Hospital Stay: Payer: Medicaid Other | Admitting: General Practice

## 2018-09-03 DIAGNOSIS — R110 Nausea: Secondary | ICD-10-CM | POA: Insufficient documentation

## 2018-09-03 DIAGNOSIS — M62838 Other muscle spasm: Secondary | ICD-10-CM | POA: Diagnosis not present

## 2018-09-03 DIAGNOSIS — Z5189 Encounter for other specified aftercare: Secondary | ICD-10-CM | POA: Diagnosis not present

## 2018-09-03 DIAGNOSIS — C3412 Malignant neoplasm of upper lobe, left bronchus or lung: Secondary | ICD-10-CM | POA: Diagnosis present

## 2018-09-03 DIAGNOSIS — R51 Headache: Secondary | ICD-10-CM | POA: Diagnosis not present

## 2018-09-03 DIAGNOSIS — R1032 Left lower quadrant pain: Secondary | ICD-10-CM | POA: Diagnosis not present

## 2018-09-03 DIAGNOSIS — I700 Atherosclerosis of aorta: Secondary | ICD-10-CM | POA: Diagnosis not present

## 2018-09-03 DIAGNOSIS — F17210 Nicotine dependence, cigarettes, uncomplicated: Secondary | ICD-10-CM | POA: Diagnosis not present

## 2018-09-03 DIAGNOSIS — D509 Iron deficiency anemia, unspecified: Secondary | ICD-10-CM | POA: Diagnosis not present

## 2018-09-03 DIAGNOSIS — C3490 Malignant neoplasm of unspecified part of unspecified bronchus or lung: Secondary | ICD-10-CM

## 2018-09-03 DIAGNOSIS — Z5111 Encounter for antineoplastic chemotherapy: Secondary | ICD-10-CM | POA: Diagnosis present

## 2018-09-03 DIAGNOSIS — J449 Chronic obstructive pulmonary disease, unspecified: Secondary | ICD-10-CM | POA: Insufficient documentation

## 2018-09-03 DIAGNOSIS — Z79899 Other long term (current) drug therapy: Secondary | ICD-10-CM | POA: Diagnosis not present

## 2018-09-03 DIAGNOSIS — C3492 Malignant neoplasm of unspecified part of left bronchus or lung: Secondary | ICD-10-CM

## 2018-09-03 DIAGNOSIS — C771 Secondary and unspecified malignant neoplasm of intrathoracic lymph nodes: Secondary | ICD-10-CM | POA: Insufficient documentation

## 2018-09-03 MED ORDER — GABAPENTIN 300 MG PO CAPS: 300.0000 mg | ORAL_CAPSULE | Freq: Three times a day (TID) | ORAL | 0 refills | Status: DC

## 2018-09-03 MED ORDER — ONDANSETRON HCL 8 MG PO TABS: 8.0000 mg | ORAL_TABLET | Freq: Two times a day (BID) | ORAL | 1 refills | Status: DC | PRN

## 2018-09-03 MED ORDER — PROCHLORPERAZINE MALEATE 10 MG PO TABS: 10.0000 mg | ORAL_TABLET | Freq: Four times a day (QID) | ORAL | 1 refills | Status: DC | PRN

## 2018-09-03 MED FILL — PROCHLORPERAZINE 10 MG TAB: 10 | 7 days supply | Qty: 30 | Fill #0

## 2018-09-03 MED FILL — GABAPENTIN 300 MG CAPSULE: 300 | 30 days supply | Qty: 90 | Fill #0

## 2018-09-03 NOTE — Progress Notes (Signed)
Met with patient to introduce myself as Arboriculturist and to offer available resources.  Approved patient for one-time $700 [place] to assist with personal expenses while going through treatment. Patient funds restricted to medication and gas cards due to being uninsured and having medication needs. Patient states she will use for medications.  Had provider send prescriptions to [facility] to utilize grant. Called [facility] to confirm funds enough to cover meds.  Advised patient she can proceed to [facility] to obtain medications at no charge to her. She verbalized understanding.  She has my card for any additional financial questions or concerns.

## 2018-09-03 NOTE — Progress Notes (Signed)
[place] CSW Progress Notes  Met w patient in lobby to introduce self, provide information on Support Center resources, enroll in [place] gas card program and give 1st of [place] gas/food card distributions.  Encouraged her to connect w Altamese Cabal, inpatient Financial Advocate who is working on her Medicaid and disability applications.  She does not have consistent access to a phone - borrows a friends.  Is expecting to get phone in mail within 10 days.  Gave patient contact information for Altamese Cabal and Stefanie Libel so she can explore resources available through [employer].  Encouraged to participate in support groups via phone as possible.  Edwyna Shell, LCSW Clinical Social Worker Phone:  [phone]

## 2018-09-04 ENCOUNTER — Encounter: Payer: Self-pay | Admitting: Hematology

## 2018-09-06 ENCOUNTER — Other Ambulatory Visit: Payer: Self-pay

## 2018-09-06 ENCOUNTER — Inpatient Hospital Stay: Payer: Medicaid Other

## 2018-09-06 DIAGNOSIS — Z5111 Encounter for antineoplastic chemotherapy: Secondary | ICD-10-CM | POA: Diagnosis not present

## 2018-09-06 DIAGNOSIS — C3490 Malignant neoplasm of unspecified part of unspecified bronchus or lung: Secondary | ICD-10-CM

## 2018-09-06 DIAGNOSIS — C3492 Malignant neoplasm of unspecified part of left bronchus or lung: Secondary | ICD-10-CM

## 2018-09-06 LAB — CBC WITH DIFFERENTIAL (CANCER CENTER ONLY)
Abs Immature Granulocytes: 0.03 10*3/uL (ref 0.00–0.07)
Basophils Absolute: 0.1 10*3/uL (ref 0.0–0.1)
Basophils Relative: 1 %
Eosinophils Absolute: 0.1 10*3/uL (ref 0.0–0.5)
Eosinophils Relative: 1 %
HCT: 37.2 % (ref 36.0–46.0)
Hemoglobin: 10.5 g/dL — ABNORMAL LOW (ref 12.0–15.0)
Immature Granulocytes: 0 %
Lymphocytes Relative: 11 %
Lymphs Abs: 1.2 10*3/uL (ref 0.7–4.0)
MCH: 24 pg — ABNORMAL LOW (ref 26.0–34.0)
MCHC: 28.2 g/dL — ABNORMAL LOW (ref 30.0–36.0)
MCV: 84.9 fL (ref 80.0–100.0)
Monocytes Absolute: 0.9 10*3/uL (ref 0.1–1.0)
Monocytes Relative: 8 %
Neutro Abs: 8.8 10*3/uL — ABNORMAL HIGH (ref 1.7–7.7)
Neutrophils Relative %: 79 %
Platelet Count: 389 10*3/uL (ref 150–400)
RBC: 4.38 MIL/uL (ref 3.87–5.11)
RDW: 26.2 % — ABNORMAL HIGH (ref 11.5–15.5)
WBC Count: 11 10*3/uL — ABNORMAL HIGH (ref 4.0–10.5)
nRBC: 0 % (ref 0.0–0.2)

## 2018-09-06 LAB — CMP (CANCER CENTER ONLY)
ALT: 60 U/L — ABNORMAL HIGH (ref 0–44)
AST: 29 U/L (ref 15–41)
Albumin: 3.3 g/dL — ABNORMAL LOW (ref 3.5–5.0)
Alkaline Phosphatase: 161 U/L — ABNORMAL HIGH (ref 38–126)
Anion gap: 11 (ref 5–15)
BUN: 7 mg/dL (ref 6–20)
CO2: 25 mmol/L (ref 22–32)
Calcium: 9.6 mg/dL (ref 8.9–10.3)
Chloride: 105 mmol/L (ref 98–111)
Creatinine: 0.69 mg/dL (ref 0.44–1.00)
GFR, Est AFR Am: >60 mL/min (ref >60)
GFR, Estimated: >60 mL/min (ref >60)
Glucose, Bld: 113 mg/dL — ABNORMAL HIGH (ref 70–99)
Potassium: 3.8 mmol/L (ref 3.5–5.1)
Sodium: 141 mmol/L (ref 135–145)
Total Bilirubin: 0.2 mg/dL — ABNORMAL LOW (ref 0.3–1.2)
Total Protein: 7.3 g/dL (ref 6.5–8.1)

## 2018-09-07 ENCOUNTER — Telehealth: Payer: Self-pay | Admitting: Hematology

## 2018-09-07 ENCOUNTER — Inpatient Hospital Stay: Payer: Medicaid Other

## 2018-09-07 ENCOUNTER — Other Ambulatory Visit: Payer: Self-pay

## 2018-09-07 VITALS — BP 113/67 | HR 99 | Temp 97.8°F | Resp 18

## 2018-09-07 DIAGNOSIS — C3492 Malignant neoplasm of unspecified part of left bronchus or lung: Secondary | ICD-10-CM

## 2018-09-07 DIAGNOSIS — Z5111 Encounter for antineoplastic chemotherapy: Secondary | ICD-10-CM | POA: Diagnosis not present

## 2018-09-07 MED ORDER — POTASSIUM CHLORIDE 2 MEQ/ML IV SOLN
Freq: Once | INTRAVENOUS | Status: AC
  Administered 2018-09-07: 09:00:00 via INTRAVENOUS
  Filled 2018-09-07: qty 10

## 2018-09-07 MED ORDER — SODIUM CHLORIDE 0.9 % IV SOLN
Freq: Once | INTRAVENOUS | Status: AC
  Administered 2018-09-07: 08:00:00 via INTRAVENOUS
  Filled 2018-09-07: qty 250

## 2018-09-07 MED ORDER — SODIUM CHLORIDE 0.9 % IV SOLN
Freq: Once | INTRAVENOUS | Status: AC
  Administered 2018-09-07: 11:00:00 via INTRAVENOUS
  Filled 2018-09-07: qty 5

## 2018-09-07 MED ORDER — SODIUM CHLORIDE 0.9 % IV SOLN
100.0000 mg/m2 | Freq: Once | INTRAVENOUS | Status: AC
  Administered 2018-09-07: 190 mg via INTRAVENOUS
  Filled 2018-09-07: qty 9.5

## 2018-09-07 MED ORDER — SODIUM CHLORIDE 0.9 % IV SOLN
80.0000 mg/m2 | Freq: Once | INTRAVENOUS | Status: AC
  Administered 2018-09-07: 152 mg via INTRAVENOUS
  Filled 2018-09-07: qty 152

## 2018-09-07 MED ORDER — PALONOSETRON HCL INJECTION 0.25 MG/5ML
0.2500 mg | Freq: Once | INTRAVENOUS | Status: AC
  Administered 2018-09-07: 0.25 mg via INTRAVENOUS

## 2018-09-07 MED ORDER — PALONOSETRON HCL INJECTION 0.25 MG/5ML
INTRAVENOUS | Status: AC
  Filled 2018-09-07: qty 5

## 2018-09-07 NOTE — Progress Notes (Signed)
Thoracic Location of Tumor / Histology: Small cell lung cancer, left lung  Patient presented   MRI Brain 08/27/2018: Sub-centimeter focus of diffusion signal abnormality in the left cerebellum.  No enhancement is evident to strongly suggest a metastasis, and this may reflect an acute to subacute small vessel infarct.  No evidence of intracranial metastatic disease elsewhere.  CT AP 08/26/2018: 2.9 cm left adrenal nodule cannot be definitively characterized on this study.  Potentially a lipid poor adenoma, but metastatic disease not excluded.  No other findings suggest metastatic disease in the abdomen or pelvis.  CTA chest 07/29/2018: Malignant appearing left mediastinal and hilar lymphadenopathy narrowing the airways at the left hilum.  In the left lung only a subtle 9 mm spiculated nodule is identified in the lingula.  Consider small call carcinoma.  Biopsies of  Left Lung 08/24/2018    Tobacco/Marijuana/Snuff/ETOH use: Current smoker, Trying to quit.  Past/Anticipated interventions by cardiothoracic surgery, if any:   Past/Anticipated interventions by medical oncology, if any:  Dr. Burr Medico 09/03/2018 -Concurrent chemoRT with IV cisplatin on day 1 and etopside day 1-3 for every 3 weeks starting 8/11.  Will start radiation with Dr. Lisbeth Renshaw with cycle 2. -Chemo start date 09/07/2018   Signs/Symptoms  Weight changes, if any: no  Respiratory complaints, if any: non-productive cough.  Occasional productive cough of clear sputum.  Hemoptysis, if any: No  Pain issues, if any: 8/10 pain in her left side, left shoulder, under left breast and headaches.  SAFETY ISSUES:  Prior radiation? No  Pacemaker/ICD? No  Possible current pregnancy? Postmenopausal  Is the patient on methotrexate? No  Current Complaints / other details:

## 2018-09-07 NOTE — Patient Instructions (Signed)
[facility] Discharge Instructions for Patients Receiving Chemotherapy  Today you received the following chemotherapy agents Etoposide (VP-16) and Cisplatin  To help prevent nausea and vomiting after your treatment, we encourage you to take your nausea medication as prescribed.    If you develop nausea and vomiting that is not controlled by your nausea medication, call the clinic.   BELOW ARE SYMPTOMS THAT SHOULD BE REPORTED IMMEDIATELY:  *FEVER GREATER THAN 100.5 F  *CHILLS WITH OR WITHOUT FEVER  NAUSEA AND VOMITING THAT IS NOT CONTROLLED WITH YOUR NAUSEA MEDICATION  *UNUSUAL SHORTNESS OF BREATH  *UNUSUAL BRUISING OR BLEEDING  TENDERNESS IN MOUTH AND THROAT WITH OR WITHOUT PRESENCE OF ULCERS  *URINARY PROBLEMS  *BOWEL PROBLEMS  UNUSUAL RASH Items with * indicate a potential emergency and should be followed up as soon as possible.  Feel free to call the clinic should you have any questions or concerns. The clinic phone number is [phone].  Please show the [facility] at check-in to the Emergency Department and triage nurse.  Etoposide, VP-16 injection What is this medicine? ETOPOSIDE, VP-16 (e toe POE side) is a chemotherapy drug. It is used to treat testicular cancer, lung cancer, and other cancers. This medicine may be used for other purposes; ask your health care provider or pharmacist if you have questions. COMMON BRAND NAME(S): Etopophos, Toposar, VePesid What should I tell my health care provider before I take this medicine? They need to know if you have any of these conditions:  infection  kidney disease  liver disease  low blood counts, like low white cell, platelet, or red cell counts  an unusual or allergic reaction to etoposide, other medicines, foods, dyes, or preservatives  pregnant or trying to get pregnant  breast-feeding How should I use this medicine? This medicine is for infusion into a vein. It is administered in a  hospital or clinic by a specially trained health care professional. Talk to your pediatrician regarding the use of this medicine in children. Special care may be needed. Overdosage: If you think you have taken too much of this medicine contact a poison control center or emergency room at once. NOTE: This medicine is only for you. Do not share this medicine with others. What if I miss a dose? It is important not to miss your dose. Call your doctor or health care professional if you are unable to keep an appointment. What may interact with this medicine?  aspirin  certain medications for seizures like carbamazepine, phenobarbital, phenytoin, valproic acid  cyclosporine  levamisole  warfarin This list may not describe all possible interactions. Give your health care provider a list of all the medicines, herbs, non-prescription drugs, or dietary supplements you use. Also tell them if you smoke, drink alcohol, or use illegal drugs. Some items may interact with your medicine. What should I watch for while using this medicine? Visit your doctor for checks on your progress. This drug may make you feel generally unwell. This is not uncommon, as chemotherapy can affect healthy cells as well as cancer cells. Report any side effects. Continue your course of treatment even though you feel ill unless your doctor tells you to stop. In some cases, you may be given additional medicines to help with side effects. Follow all directions for their use. Call your doctor or health care professional for advice if you get a fever, chills or sore throat, or other symptoms of a cold or flu. Do not treat yourself. This drug decreases  your body's ability to fight infections. Try to avoid being around people who are sick. This medicine may increase your risk to bruise or bleed. Call your doctor or health care professional if you notice any unusual bleeding. Talk to your doctor about your risk of cancer. You may be more at  risk for certain types of cancers if you take this medicine. Do not become pregnant while taking this medicine or for at least 6 months after stopping it. Women should inform their doctor if they wish to become pregnant or think they might be pregnant. Women of child-bearing potential will need to have a negative pregnancy test before starting this medicine. There is a potential for serious side effects to an unborn child. Talk to your health care professional or pharmacist for more information. Do not breast-feed an infant while taking this medicine. Men must use a latex condom during sexual contact with a woman while taking this medicine and for at least 4 months after stopping it. A latex condom is needed even if you have had a vasectomy. Contact your doctor right away if your partner becomes pregnant. Do not donate sperm while taking this medicine and for at least 4 months after you stop taking this medicine. Men should inform their doctors if they wish to father a child. This medicine may lower sperm counts. What side effects may I notice from receiving this medicine? Side effects that you should report to your doctor or health care professional as soon as possible:  allergic reactions like skin rash, itching or hives, swelling of the face, lips, or tongue  low blood counts - this medicine may decrease the number of white blood cells, red blood cells and platelets. You may be at increased risk for infections and bleeding.  signs of infection - fever or chills, cough, sore throat, pain or difficulty passing urine  signs of decreased platelets or bleeding - bruising, pinpoint red spots on the skin, black, tarry stools, blood in the urine  signs of decreased red blood cells - unusually weak or tired, fainting spells, lightheadedness  breathing problems  changes in vision  mouth or throat sores or ulcers  pain, redness, swelling or irritation at the injection site  pain, tingling, numbness  in the hands or feet  redness, blistering, peeling or loosening of the skin, including inside the mouth  seizures  vomiting Side effects that usually do not require medical attention (report to your doctor or health care professional if they continue or are bothersome):  diarrhea  hair loss  loss of appetite  nausea  stomach pain This list may not describe all possible side effects. Call your doctor for medical advice about side effects. You may report side effects to FDA at 1-800-FDA-1088. Where should I keep my medicine? This drug is given in a hospital or clinic and will not be stored at home. NOTE: This sheet is a summary. It may not cover all possible information. If you have questions about this medicine, talk to your doctor, pharmacist, or health care provider.  2020 Elsevier/Gold Standard (2015-01-05 11:53:23)  Cisplatin injection What is this medicine? CISPLATIN (SIS pla tin) is a chemotherapy drug. It targets fast dividing cells, like cancer cells, and causes these cells to die. This medicine is used to treat many types of cancer like bladder, ovarian, and testicular cancers. This medicine may be used for other purposes; ask your health care provider or pharmacist if you have questions. COMMON BRAND NAME(S): Platinol, Platinol -AQ What  should I tell my health care provider before I take this medicine? They need to know if you have any of these conditions:  blood disorders  hearing problems  kidney disease  recent or ongoing radiation therapy  an unusual or allergic reaction to cisplatin, carboplatin, other chemotherapy, other medicines, foods, dyes, or preservatives  pregnant or trying to get pregnant  breast-feeding How should I use this medicine? This drug is given as an infusion into a vein. It is administered in a hospital or clinic by a specially trained health care professional. Talk to your pediatrician regarding the use of this medicine in children.  Special care may be needed. Overdosage: If you think you have taken too much of this medicine contact a poison control center or emergency room at once. NOTE: This medicine is only for you. Do not share this medicine with others. What if I miss a dose? It is important not to miss a dose. Call your doctor or health care professional if you are unable to keep an appointment. What may interact with this medicine?  dofetilide  foscarnet  medicines for seizures  medicines to increase blood counts like filgrastim, pegfilgrastim, sargramostim  probenecid  pyridoxine used with altretamine  rituximab  some antibiotics like amikacin, gentamicin, neomycin, polymyxin B, streptomycin, tobramycin  sulfinpyrazone  vaccines  zalcitabine Talk to your doctor or health care professional before taking any of these medicines:  acetaminophen  aspirin  ibuprofen  ketoprofen  naproxen This list may not describe all possible interactions. Give your health care provider a list of all the medicines, herbs, non-prescription drugs, or dietary supplements you use. Also tell them if you smoke, drink alcohol, or use illegal drugs. Some items may interact with your medicine. What should I watch for while using this medicine? Your condition will be monitored carefully while you are receiving this medicine. You will need important blood work done while you are taking this medicine. This drug may make you feel generally unwell. This is not uncommon, as chemotherapy can affect healthy cells as well as cancer cells. Report any side effects. Continue your course of treatment even though you feel ill unless your doctor tells you to stop. In some cases, you may be given additional medicines to help with side effects. Follow all directions for their use. Call your doctor or health care professional for advice if you get a fever, chills or sore throat, or other symptoms of a cold or flu. Do not treat yourself. This  drug decreases your body's ability to fight infections. Try to avoid being around people who are sick. This medicine may increase your risk to bruise or bleed. Call your doctor or health care professional if you notice any unusual bleeding. Be careful brushing and flossing your teeth or using a toothpick because you may get an infection or bleed more easily. If you have any dental work done, tell your dentist you are receiving this medicine. Avoid taking products that contain aspirin, acetaminophen, ibuprofen, naproxen, or ketoprofen unless instructed by your doctor. These medicines may hide a fever. Do not become pregnant while taking this medicine. Women should inform their doctor if they wish to become pregnant or think they might be pregnant. There is a potential for serious side effects to an unborn child. Talk to your health care professional or pharmacist for more information. Do not breast-feed an infant while taking this medicine. Drink fluids as directed while you are taking this medicine. This will help protect your kidneys. Call  your doctor or health care professional if you get diarrhea. Do not treat yourself. What side effects may I notice from receiving this medicine? Side effects that you should report to your doctor or health care professional as soon as possible:  allergic reactions like skin rash, itching or hives, swelling of the face, lips, or tongue  signs of infection - fever or chills, cough, sore throat, pain or difficulty passing urine  signs of decreased platelets or bleeding - bruising, pinpoint red spots on the skin, black, tarry stools, nosebleeds  signs of decreased red blood cells - unusually weak or tired, fainting spells, lightheadedness  breathing problems  changes in hearing  gout pain  low blood counts - This drug may decrease the number of white blood cells, red blood cells and platelets. You may be at increased risk for infections and bleeding.  nausea  and vomiting  pain, swelling, redness or irritation at the injection site  pain, tingling, numbness in the hands or feet  problems with balance, movement  trouble passing urine or change in the amount of urine Side effects that usually do not require medical attention (report to your doctor or health care professional if they continue or are bothersome):  changes in vision  loss of appetite  metallic taste in the mouth or changes in taste This list may not describe all possible side effects. Call your doctor for medical advice about side effects. You may report side effects to FDA at 1-800-FDA-1088. Where should I keep my medicine? This drug is given in a hospital or clinic and will not be stored at home. NOTE: This sheet is a summary. It may not cover all possible information. If you have questions about this medicine, talk to your doctor, pharmacist, or health care provider.  2020 Elsevier/Gold Standard (2007-04-20 14:40:54)

## 2018-09-07 NOTE — Telephone Encounter (Signed)
Scheduled per los. Calendar was given to patient in infusion

## 2018-09-08 ENCOUNTER — Telehealth: Payer: Self-pay | Admitting: Hematology

## 2018-09-08 ENCOUNTER — Ambulatory Visit
Admission: RE | Admit: 2018-09-08 | Discharge: 2018-09-08 | Disposition: A | Discharge status: Home / Self Care | Payer: Self-pay | Source: Ambulatory Visit | Attending: Radiation Oncology | Admitting: Radiation Oncology

## 2018-09-08 ENCOUNTER — Other Ambulatory Visit: Payer: Self-pay | Admitting: Hematology

## 2018-09-08 ENCOUNTER — Encounter: Payer: Self-pay | Admitting: Radiation Oncology

## 2018-09-08 ENCOUNTER — Other Ambulatory Visit: Payer: Self-pay

## 2018-09-08 ENCOUNTER — Inpatient Hospital Stay: Payer: Medicaid Other

## 2018-09-08 ENCOUNTER — Telehealth: Payer: Self-pay

## 2018-09-08 VITALS — Ht 62.0 in | Wt 178.8 lb

## 2018-09-08 VITALS — BP 139/48 | HR 89 | Temp 98.7°F | Resp 18

## 2018-09-08 DIAGNOSIS — C3492 Malignant neoplasm of unspecified part of left bronchus or lung: Secondary | ICD-10-CM

## 2018-09-08 DIAGNOSIS — C3412 Malignant neoplasm of upper lobe, left bronchus or lung: Secondary | ICD-10-CM

## 2018-09-08 DIAGNOSIS — Z5111 Encounter for antineoplastic chemotherapy: Secondary | ICD-10-CM | POA: Diagnosis not present

## 2018-09-08 MED ORDER — SODIUM CHLORIDE 0.9 % IV SOLN
100.0000 mg/m2 | Freq: Once | INTRAVENOUS | Status: AC
  Administered 2018-09-08: 190 mg via INTRAVENOUS
  Filled 2018-09-08: qty 9.5

## 2018-09-08 MED ORDER — SODIUM CHLORIDE 0.9 % IV SOLN
Freq: Once | INTRAVENOUS | Status: AC
  Administered 2018-09-08: 08:00:00 via INTRAVENOUS
  Filled 2018-09-08: qty 250

## 2018-09-08 MED ORDER — SODIUM CHLORIDE 0.9 % IV SOLN: 10.0000 mg | Freq: Once | INTRAVENOUS | Status: DC

## 2018-09-08 MED ORDER — OXYCODONE HCL 5 MG PO TABS: 5.0000 mg | ORAL_TABLET | Freq: Four times a day (QID) | ORAL | 0 refills | Status: DC | PRN

## 2018-09-08 MED ORDER — DEXAMETHASONE SODIUM PHOSPHATE 10 MG/ML IJ SOLN
10.0000 mg | Freq: Once | INTRAMUSCULAR | Status: AC
  Administered 2018-09-08: 10 mg via INTRAVENOUS

## 2018-09-08 MED ORDER — DEXAMETHASONE SODIUM PHOSPHATE 10 MG/ML IJ SOLN
INTRAMUSCULAR | Status: AC
  Filled 2018-09-08: qty 1

## 2018-09-08 MED FILL — oxyCODONE HCL 5 MG TABS: 5 | 7 days supply | Qty: 30 | Fill #0

## 2018-09-08 NOTE — Telephone Encounter (Signed)
I called pt back and left her a message regarding her severe left chest pain, which is probably related to previous chest tube. I recommend her gradually increase gabapentin to 600 mg 3 times a day, I refilled oxycodone 5 mg every 6 hours as needed for severe pain.  If she agrees, I will also refer her to see a pain specialist to see if the can do nerve block for her pain.  I encouraged her to call me back if needed.  Marissa [name]  09/08/2018

## 2018-09-08 NOTE — Telephone Encounter (Signed)
Patient calls stating her since starting her treatment (first was 8/11)  that her pain has increased under her left breast and side. Pain I left shoulder down her arm. Rates pain 10/10  Having headaches as well.  Scared to take headache medicine because it has caffeine in at.  Denies any chest pain or increased shortness of breath. Explained to patient I need to discuss this with Dr. Burr Medico and get back to her.     She took her last pain pill this morning. She is asking for something for pain.  #[phone]

## 2018-09-08 NOTE — Progress Notes (Addendum)
Radiation Oncology         [phone] ________________________________  Initial Outpatient Consultation - Conducted via telephone due to current COVID-19 concerns for limiting patient exposure  I spoke with the patient to conduct this consult visit via telephone to spare the patient unnecessary potential exposure in the healthcare setting during the current COVID-19 pandemic. The patient was notified in advance and was offered a [place] meeting to allow for face to face communication but unfortunately reported that they did not have the appropriate resources/technology to support such a visit and instead preferred to proceed with a telephone consult.  ________________________________  Name: Marissa [name]        MRN: [id number]  Date of Service: 09/08/2018 DOB: May 08, 1965  [id number], No Pcp Per  [name], MD     REFERRING PHYSICIAN: [name], MD   DIAGNOSIS: The encounter diagnosis was Small cell lung cancer, left ([place]).   HISTORY OF PRESENT ILLNESS: Marissa [name] is a 53 y.o. female seen at the request of Dr. 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 The patient originally presented due to shortness of breath and a CT angiogram on 07/29/2018 revealed a left mediastinal and hilar grouping of adenopathy narrowing the airways at the left hilum there was a 9 mm spiculated nodule in the lingula, indeterminate low density left adrenal nodule and no evidence of embolism.  She was worked up by Dr. Loanne Drilling in pulmonology and underwent bronchoscopy with EBUS on 08/24/2018 which revealed a small cell carcinoma of the bronchial brushings.  No malignancy was noted in or of the left hilar nodes sampled or in the station #7 lymph node.  She went on to have staging imaging of the abdomen and pelvis on 08/26/2018 revealing a 2.9 cm left adrenal nodule.  Brain MRI on 08/27/2018 was also negative for gross evidence of disease but there was a signal abnormality in the left cerebellum possibly  representing small vessel infarct.  PET scan on 09/02/2018 following her hospital discharge revealed hypermetabolism in the left hilum, mediastinal nodal site, and a loculated pleural effusion with mild increased FDG uptake no evidence of metastatic disease was noted elsewhere.  She began systemic chemotherapy with cisplatin etoposide on 09/07/2018, she is considered limited stage, and is evaluated by phone to discuss chemoradiation to begin with cycle #2 which is tentatively scheduled for 09/28/2018. Of note she did have a chest tube due to a tension pneumothorax which continues to cause pain at the site.  PREVIOUS RADIATION THERAPY: No   PAST MEDICAL HISTORY:  Past Medical History:  Diagnosis Date   Asthma    Chronic headaches    COPD (chronic obstructive pulmonary disease) ([place])    Depression    Heart murmur        PAST SURGICAL HISTORY: Past Surgical History:  Procedure Laterality Date   BRONCHIAL BIOPSY  08/24/2018   Procedure: BRONCHIAL BIOPSIES;  Surgeon: Margaretha Seeds, MD;  Location: [facility];  Service: Cardiopulmonary;;   BRONCHIAL BRUSHINGS  08/24/2018   Procedure: BRONCHIAL BRUSHINGS;  Surgeon: Margaretha Seeds, MD;  Location: [facility];  Service: Cardiopulmonary;;   BRONCHIAL NEEDLE ASPIRATION BIOPSY  08/24/2018   Procedure: BRONCHIAL NEEDLE ASPIRATION BIOPSIES;  Surgeon: Margaretha Seeds, MD;  Location: [facility];  Service: Cardiopulmonary;;   ENDOBRONCHIAL ULTRASOUND N/A 08/24/2018   Procedure: ENDOBRONCHIAL ULTRASOUND;  Surgeon: Margaretha Seeds, MD;  Location: [facility];  Service: Cardiopulmonary;  Laterality: N/A;   TUBAL LIGATION     VIDEO  BRONCHOSCOPY  08/24/2018   Procedure: VIDEO BRONCHOSCOPY;  Surgeon: Margaretha Seeds, MD;  Location: [facility];  Service: Cardiopulmonary;;     FAMILY HISTORY:  Family History  Problem Relation Age of Onset   Heart disease Mother    Dementia Father      SOCIAL HISTORY:  reports that she has been  smoking cigarettes. She started smoking about 39 years ago. She has a 78.00 pack-year smoking history. She has never used smokeless tobacco. She reports current alcohol use. She reports previous drug use. Drug: Marijuana.   ALLERGIES: Penicillins   MEDICATIONS:  Current Outpatient Medications  Medication Sig Dispense Refill   budesonide-formoterol (SYMBICORT) 160-4.5 MCG/ACT inhaler Inhale 2 puffs into the lungs 2 (two) times daily. (Patient not taking: Reported on 09/03/2018) 1 Inhaler 0   butalbital-acetaminophen-caffeine (FIORICET) 50-325-40 MG tablet Take 1 tablet by mouth every 6 (six) hours as needed for headache. Patient will pick up scripts today. 60 tablet 3   diazepam (VALIUM) 10 MG tablet Take 10 mg by mouth daily as needed for anxiety.     gabapentin (NEURONTIN) 300 MG capsule Take 1 capsule (300 mg total) by mouth 3 (three) times daily. 90 capsule 0   ipratropium-albuterol (DUONEB) 0.5-2.5 (3) MG/3ML SOLN Take 3 mLs by nebulization every 6 (six) hours as needed. 360 mL 0   nicotine (NICODERM CQ - DOSED IN MG/24 HOURS) 21 mg/24hr patch Place 1 patch (21 mg total) onto the skin daily. Patient will pick up scripts today. 42 patch 0   ondansetron (ZOFRAN) 8 MG tablet Take 1 tablet (8 mg total) by mouth 2 (two) times daily as needed. Start on the third day after cisplatin chemotherapy. 30 tablet 1   oxyCODONE (OXY IR/ROXICODONE) 5 MG immediate release tablet 1-2 every 4 hrs as needed for pain 60 tablet 0   predniSONE (DELTASONE) 20 MG tablet 2 tabs daily for 2 days, then 1 tab daily for 2 days, then 1/2 tab daily for 2 days 7 tablet 0   prochlorperazine (COMPAZINE) 10 MG tablet Take 1 tablet (10 mg total) by mouth every 6 (six) hours as needed (Nausea or vomiting). 30 tablet 1   Tiotropium Bromide Monohydrate (SPIRIVA RESPIMAT) 2.5 MCG/ACT AERS Inhale 2 puffs into the lungs daily. (Patient not taking: Reported on 09/03/2018) 4 g 0   traZODone (DESYREL) 100 MG tablet Take 1 tablet  (100 mg total) by mouth at bedtime. Patient will pick up scripts today. 90 tablet 2   No current facility-administered medications for this encounter.      REVIEW OF SYSTEMS: On review of systems, the patient reports that she is doing well overall. Her main concern now is that she has chest wall pain below the site of where her chest tube had been. She does have some trouble with breathing when she exerts herself. She denies any chest pain, cough, fevers, chills, night sweats, unintended weight changes. She denies any bowel or bladder disturbances, and denies abdominal pain, nausea or vomiting. She denies any new musculoskeletal or joint aches or pains. A complete review of systems is obtained and is otherwise negative.   PHYSICAL EXAM:  Unable to assess due to encounter type.  ECOG = 1  0 - Asymptomatic (Fully active, able to carry on all predisease activities without restriction)  1 - Symptomatic but completely ambulatory (Restricted in physically strenuous activity but ambulatory and able to carry out work of a light or sedentary nature. For example, light housework, office work)  2 - Symptomatic, <  50% in bed during the day (Ambulatory and capable of all self care but unable to carry out any work activities. Up and about more than 50% of waking hours)  3 - Symptomatic, >50% in bed, but not bedbound (Capable of only limited self-care, confined to bed or chair 50% or more of waking hours)  4 - Bedbound (Completely disabled. Cannot carry on any self-care. Totally confined to bed or chair)  5 - Death   Eustace Pen MM, Creech RH, Tormey DC, et al. [phone]). "Toxicity and response criteria of the [facility]". [place] Oncol. 5 (6): 649-55    LABORATORY DATA:  Lab Results  Component Value Date   WBC 11.0 (H) 09/06/2018   HGB 10.5 (L) 09/06/2018   HCT 37.2 09/06/2018   MCV 84.9 09/06/2018   PLT 389 09/06/2018   Lab Results  Component Value Date   NA 141  09/06/2018   K 3.8 09/06/2018   CL 105 09/06/2018   CO2 25 09/06/2018   Lab Results  Component Value Date   ALT 60 (H) 09/06/2018   AST 29 09/06/2018   ALKPHOS 161 (H) 09/06/2018   BILITOT 0.2 (L) 09/06/2018      RADIOGRAPHY: Mr Jeri Cos GM Contrast  Result Date: 08/27/2018 CLINICAL DATA:  Staging of newly diagnosed small-cell lung cancer. Worsening headaches. EXAM: MRI HEAD WITHOUT AND WITH CONTRAST TECHNIQUE: Multiplanar, multiecho pulse sequences of the brain and surrounding structures were obtained without and with intravenous contrast. CONTRAST:  9 mL Gadavist COMPARISON:  None. FINDINGS: The patient terminated the examination prior to completion due to pain and claustrophobia. The study is mildly to moderately motion degraded, and only an axial T1 postcontrast sequence was obtained (planned sagittal and coronal postcontrast T1 imaging was not obtained). Brain: There is a 6 mm focus of mild trace diffusion hyperintensity (versus 2 smaller adjacent foci) medially in the left cerebellar hemisphere with normal ADC, at most subtle T2 hyperintensity, and no appreciable enhancement on the motion degraded axial T1 postcontrast series. No abnormal intracranial enhancement is identified elsewhere. No intracranial hemorrhage, midline shift, or extra-axial fluid collection is evident. The ventricles and sulci are normal. No significant cerebral white matter disease is seen. Vascular: Major intracranial vascular flow voids are preserved. Skull and upper cervical spine: Diminished bone marrow T1 signal intensity in the calvarium and upper cervical spine diffusely, nonspecific though can be seen with anemia, smoking, and obesity. Sinuses/Orbits: Unremarkable orbits. No significant inflammatory changes in the paranasal sinuses or mastoid air cells. Other: None. IMPRESSION: 1. Motion degraded, incomplete examination. 2. Subcentimeter focus of diffusion signal abnormality in the left cerebellum. No enhancement is  evident to strongly suggest a metastasis, and this may reflect an acute to subacute small vessel infarct. 3. No evidence of intracranial metastatic disease elsewhere. 4. Given motion artifact and incomplete postcontrast imaging, consider short-term follow-up MRI (possibly with sedation) to further evaluate the left cerebellar abnormality and provide a more thorough evaluation for metastatic disease. Electronically Signed   By: Logan Bores M.D.   On: 08/27/2018 12:01   Ct Abdomen Pelvis W Contrast  Result Date: 08/26/2018 CLINICAL DATA:  Small-cell lung cancer in the lingula.  Staging. EXAM: CT ABDOMEN AND PELVIS WITH CONTRAST TECHNIQUE: Multidetector CT imaging of the abdomen and pelvis was performed using the standard protocol following bolus administration of intravenous contrast. CONTRAST:  180mL OMNIPAQUE IOHEXOL 300 MG/ML SOLN, 49mL OMNIPAQUE IOHEXOL 300 MG/ML SOLN COMPARISON:  07/29/2018 FINDINGS: Lower chest: Heart appears enlarged. There is  left base atelectasis with small left effusion. Left pleural catheter identified but incompletely visualized. There is some gas in the left chest wall consistent with the presence of the pleural drain. Hepatobiliary: No suspicious focal abnormality within the liver parenchyma. There is no evidence for gallstones, gallbladder wall thickening, or pericholecystic fluid. No intrahepatic or extrahepatic biliary dilation. Pancreas: No focal mass lesion. No dilatation of the main duct. No intraparenchymal cyst. No peripancreatic edema. Spleen: No splenomegaly. No focal mass lesion. Adrenals/Urinary Tract: Right adrenal gland unremarkable. 2.9 cm left adrenal nodule shows heterogeneous enhancement and a relative washout value of 34%, indeterminate for adenoma. Metastatic lesion not excluded. Kidneys unremarkable. No evidence for hydroureter. The urinary bladder appears normal for the degree of distention. Stomach/Bowel: Stomach is unremarkable. No gastric wall thickening. No  evidence of outlet obstruction. Duodenum is normally positioned as is the ligament of Treitz. No small bowel wall thickening. No small bowel dilatation. The terminal ileum is normal. The appendix is normal. No gross colonic mass. No colonic wall thickening. Diverticular changes are noted in the left colon without evidence of diverticulitis. Vascular/Lymphatic: There is abdominal aortic atherosclerosis without aneurysm. There is no gastrohepatic or hepatoduodenal ligament lymphadenopathy. No intraperitoneal or retroperitoneal lymphadenopathy. No pelvic sidewall lymphadenopathy. Reproductive: The uterus is unremarkable.  There is no adnexal mass. Other: No intraperitoneal free fluid. Musculoskeletal: No worrisome lytic or sclerotic osseous abnormality. IMPRESSION: 1. 2.9 cm left adrenal nodule cannot be definitively characterized on this study. Potentially a lipid poor adenoma, but metastatic disease not excluded. MRI of the abdomen with and without contrast may prove helpful to further evaluate. 2. No other findings to suggest metastatic disease in the abdomen/pelvis. 3. Left base atelectasis with small left pleural effusion. Electronically Signed   By: Misty Stanley M.D.   On: 08/26/2018 16:43   Nm Pet Image Restag (ps) Skull Base To Thigh  Result Date: 09/03/2018 CLINICAL DATA:  Initial treatment strategy for Lung cancer, small cell. EXAM: NUCLEAR MEDICINE PET SKULL BASE TO THIGH TECHNIQUE: 9.1 mCi F-18 FDG was injected intravenously. Full-ring PET imaging was performed from the skull base to thigh after the radiotracer. CT data was obtained and used for attenuation correction and anatomic localization. Fasting blood glucose: 95 mg/dl COMPARISON:  CT chest 07/29/2018 FINDINGS: Mediastinal blood pool activity: SUV max 2.55 Liver activity: SUV max NA NECK: No hypermetabolic lymph nodes in the neck. Asymmetric increased uptake to the left mandible is identified within SUV max of 8.02. On the corresponding CT images  there is what appears to be a large dental cavity and possible periapical abscess. Incidental CT findings: none CHEST: Left hilar lung mass is identified the measuring approximately 5 cm with SUV max of 14.1 Simmons. There is encasement and narrowing of the distal left mainstem bronchus. Postobstructive atelectasis involving the anteromedial left upper lobe is identified. Large left-sided pre-vascular nodal mass measures 4.3 x 5.8 cm with SUV max of 14.17. Hypermetabolic left paratracheal lymph node measures 1.4 cm and has an SUV max of 13.4. Within the superior mediastinum there is a left-sided pre-vascular node measuring 1.6 cm with SUV max of 7.07. No FDG avid subcarinal, right paratracheal or right hilar lymph nodes identified. Morphologically benign appearing bilateral axillary lymph nodes exhibit mild FDG uptake. For example, a 9 mm left axillary lymph node with benign fatty hilum has an SUV max of 2.8. 8 mm right axillary lymph node with preservation of fatty hilum has a SUV max of 3.8. Loculated left pleural effusion extends along the oblique  fissure and over the left apex. Increased FDG uptake within the dependent portion of the effusion has increased FDG uptake with SUV max of 3.65 concerning for malignant effusion. Tiny nodule in the right apex measures 2 mm is too small to characterize by PET-CT. Incidental CT findings: Aortic atherosclerosis. ABDOMEN/PELVIS: No abnormal hypermetabolic activity within the liver, pancreas, adrenal glands, or spleen. No hypermetabolic lymph nodes in the abdomen or pelvis. Incidental CT findings: Nodule in the left adrenal gland measures 2.9 x 2.0 cm with SUV max of 2.76. This measures -1.38 mean Hounsfield units and is favored to represent a benign adenoma. SKELETON: No focal hypermetabolic activity to suggest skeletal metastasis. Incidental CT findings: none IMPRESSION: 1. Hypermetabolic left hilar mass is identified encasing the left mainstem bronchus resulting in  postobstructive atelectasis in the left upper lobe. 2. Hypermetabolic left mediastinal nodal metastasis. 3. Loculated left pleural effusion is identified with mild increased FDG uptake. Cannot rule out malignant pleural effusion. 4. No evidence for metastatic disease to the abdomen or pelvis or skeletal structures. Electronically Signed   By: Kerby Moors M.D.   On: 09/03/2018 08:28   Dg Chest Port 1 View  Result Date: 08/27/2018 CLINICAL DATA:  Follow-up pneumothorax EXAM: PORTABLE CHEST 1 VIEW COMPARISON:  August 27, 2018 FINDINGS: Again noted is left upper lobe collapse. No pneumothorax is seen. There is stable aeration of the left lung. The right lung remains clear. The cardiomediastinal silhouette is unchanged. IMPRESSION: 1. No pneumothorax. 2. Unchanged left upper lobe collapse due to hilar mass. Electronically Signed   By: Prudencio Pair M.D.   On: 08/27/2018 17:42   Dg Chest Port 1 View  Result Date: 08/27/2018 CLINICAL DATA:  Chest tube removal EXAM: PORTABLE CHEST 1 VIEW COMPARISON:  Same-day radiograph FINDINGS: Interval removal of a left pigtail thoracostomy tube. No discernible pneumothorax. Wedge-shaped opacity persists at the left lung apex related to left upper lobe collapse related to underlying obstructing mass. Right lung remains clear. No pleural effusion. IMPRESSION: 1. Interval removal of left-sided chest tube. No pneumothorax identified. 2. Persistent left upper lobe collapse related to obstructing mass. Electronically Signed   By: Davina Poke M.D.   On: 08/27/2018 13:31   Dg Chest Port 1 View  Result Date: 08/27/2018 CLINICAL DATA:  53 year old female with a history of pneumothorax and a left-sided chest tube in place EXAM: PORTABLE CHEST 1 VIEW COMPARISON:  Prior chest x-ray 08/26/2018 FINDINGS: Stable position of left apical pigtail thoracostomy tube. No evidence of recurrent pneumothorax. Persistent left upper lobe opacity likely reflecting collapse of the left upper lobe  secondary to an underlying obstructing mass. The right lung remains clear. No acute osseous abnormality. IMPRESSION: 1. Stable position of left pigtail thoracostomy tube. 2. No evidence of recurrent pneumothorax. 3. Persistent left hilar obstructing mass and resultant left upper lobe collapse. Electronically Signed   By: Jacqulynn Cadet M.D.   On: 08/27/2018 10:08   Dg Chest Port 1 View  Result Date: 08/26/2018 CLINICAL DATA:  Follow-up pneumothorax EXAM: PORTABLE CHEST 1 VIEW COMPARISON:  August 26, 2018 8:27 a.m. FINDINGS: Again noted is a left-sided pigtail catheter. Left upper lobe collapse is again identified. There is slightly improved aeration of the remainder of the left lung. The right lung remains clear. The cardiomediastinal silhouette is unchanged. IMPRESSION: Interval slight improvement in left lung aeration with stable left upper lobe collapse Electronically Signed   By: Prudencio Pair M.D.   On: 08/26/2018 18:19   Dg Chest [facility]  Result Date: 08/26/2018 CLINICAL DATA:  Follow-up pneumothorax EXAM: PORTABLE CHEST 1 VIEW COMPARISON:  Yesterday FINDINGS: Left chest tube in stable position. Continued left upper lobe collapse. There is improving left lung aeration though. The right lung is clear. Cardiomegaly. IMPRESSION: Improved left-sided aeration but persistent left upper lobe collapse. No visible pneumothorax. Cardiomegaly. Electronically Signed   By: Monte Fantasia M.D.   On: 08/26/2018 10:58   Dg Chest Port 1 View  Result Date: 08/25/2018 CLINICAL DATA:  Pneumothorax. EXAM: PORTABLE CHEST 1 VIEW COMPARISON:  Chest x-rays dated 08/24/2018 FINDINGS: Left chest tube is unchanged in position. No visible residual pneumothorax. However the, there is increased volume loss and consolidation in the left upper and lower lung zones. Subcutaneous emphysema on the left has almost completely resolved. Chronic cardiomegaly. Right lung is clear. Pulmonary vascularity on the right is normal. No  acute bone abnormality. 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 IMPRESSION: 1. Increasing volume loss and consolidation in the left upper and lower lung zones. 2. No residual pneumothorax. Electronically Signed   By: Lorriane Shire M.D.   On: 08/25/2018 08:20   Dg Chest Port 1 View  Result Date: 08/24/2018 CLINICAL DATA:  Chest tube placement EXAM: PORTABLE CHEST 1 VIEW COMPARISON:  Portable exam 1701 hours compared to 1552 hours FINDINGS: Interval placement of a pigtail LEFT thoracostomy tube. Resolution of previously identified large LEFT tension pneumothorax. Persistent atelectasis in LEFT upper lobe and portions of LEFT lower lobe. Resolution of mediastinal shift. Small amount of LEFT lateral chest wall gas. Heart size upper normal. Subsegmental atelectasis RIGHT lung base. IMPRESSION: Resolution of LEFT tension pneumothorax following thoracostomy tube insertion. Persistent significant atelectasis of LEFT lung and minimal subsegmental atelectasis at RIGHT lung base. Electronically Signed   By: Lavonia Dana M.D.   On: 08/24/2018 17:29   Dg Chest Port 1 View  Result Date: 08/24/2018 CLINICAL DATA:  Shortness of breath.  Status post bronchoscopy.  5 EXAM: PORTABLE CHEST 1 VIEW COMPARISON:  Chest x-ray dated 03/29/2018 FINDINGS: The patient has a large left tension pneumothorax with shift of the heart mediastinal structures to the right. There is chronic cardiomegaly. Pulmonary vascularity is normal. Right lung is clear. The left lung has almost completely collapsed. No acute bone abnormality. IMPRESSION: Large left tension pneumothorax. Critical Value/emergent results were called by telephone at the time of interpretation on 08/24/2018 at 4:36 pm to [place], [place] , who verbally acknowledged these results. Electronically Signed   By: Lorriane Shire M.D.   On: 08/24/2018 16:38       IMPRESSION/PLAN: 1. Limited Stage Small Cell Carcinoma of the left lingula. Dr. Lisbeth Renshaw discusses the pathology  findings and reviews the nature of limited stage small cell cancer of the lung. He recommends proceeding with definitive chemoRT to begin with her 2nd cycle of chemotherapy which is scheduled for 09/28/2018.  We discussed the risks, benefits, short, and long term effects of radiotherapy, and the patient is interested in proceeding. Dr. Lisbeth Renshaw discusses the delivery and logistics of radiotherapy and anticipates a course of 6 1/2 weeks of radiotherapy. We will proceed with simulation on 09/21/18 and will sign consent at that time, though she gives verbal consent to proceed. We will plan to start on 09/28/2018 with her second cycle of chemo. 2. PCI. The patient is counseled on the risks associated with small cell carcinomas metastasizing to the brain and the role for prophylactic cranial irradiation. We will revisit this after the completion of her chemotherapy and discussed  we would need another MRI of the brain prior to proceeding with PCI. 3. Claustrophobia with MRI scans. We discussed that we would be happy to help give Ativan prior to MRI scans and brain treatment when she is ready for PCI.  4. Chest wall pain at prior chest tube site. She is taking Neurontin and oxycodone which we agree with. She will continue to follow up with Dr. Burr Medico regarding duration of her medication.   Given current concerns for patient exposure during the COVID-19 pandemic, this encounter was conducted via telephone.  The patient has given verbal consent for this type of encounter. The time spent during this encounter was 45 minutes and 50% of that time was spent in the coordination of her care. The attendants for this meeting include Blenda Nicely, RN, Dr. Lisbeth Renshaw, Shona Simpson, [facility] and Oletta Lamas  During the encounter, Blenda Nicely, RN, Dr. Lisbeth Renshaw, and Shona Simpson [facility] were located at [facility] Radiation Oncology Department.  Galina Haddox  was located at home.   The above documentation reflects my direct findings  during this shared patient visit. Please see the separate note by Dr. Lisbeth Renshaw on this date for the remainder of the patient's plan of care.    Carola Rhine, PAC

## 2018-09-08 NOTE — Patient Instructions (Signed)
[facility] Discharge Instructions for Patients Receiving Chemotherapy  Today you received the following chemotherapy agents Etoposide (VP-16).  To help prevent nausea and vomiting after your treatment, we encourage you to take your nausea medication as prescribed.    If you develop nausea and vomiting that is not controlled by your nausea medication, call the clinic.   BELOW ARE SYMPTOMS THAT SHOULD BE REPORTED IMMEDIATELY:  *FEVER GREATER THAN 100.5 F  *CHILLS WITH OR WITHOUT FEVER  NAUSEA AND VOMITING THAT IS NOT CONTROLLED WITH YOUR NAUSEA MEDICATION  *UNUSUAL SHORTNESS OF BREATH  *UNUSUAL BRUISING OR BLEEDING  TENDERNESS IN MOUTH AND THROAT WITH OR WITHOUT PRESENCE OF ULCERS  *URINARY PROBLEMS  *BOWEL PROBLEMS  UNUSUAL RASH Items with * indicate a potential emergency and should be followed up as soon as possible.  Feel free to call the clinic should you have any questions or concerns. The clinic phone number is [phone].  Please show the [facility] at check-in to the Emergency Department and triage nurse.

## 2018-09-09 ENCOUNTER — Other Ambulatory Visit: Payer: Self-pay

## 2018-09-09 ENCOUNTER — Ambulatory Visit: Payer: Self-pay | Admitting: Pulmonary Disease

## 2018-09-09 ENCOUNTER — Inpatient Hospital Stay (HOSPITAL_BASED_OUTPATIENT_CLINIC_OR_DEPARTMENT_OTHER): Payer: Medicaid Other | Admitting: Medical

## 2018-09-09 ENCOUNTER — Inpatient Hospital Stay: Payer: Medicaid Other

## 2018-09-09 VITALS — BP 121/58 | HR 60 | Temp 98.9°F | Resp 18

## 2018-09-09 DIAGNOSIS — C3412 Malignant neoplasm of upper lobe, left bronchus or lung: Secondary | ICD-10-CM

## 2018-09-09 DIAGNOSIS — Z5111 Encounter for antineoplastic chemotherapy: Secondary | ICD-10-CM | POA: Diagnosis not present

## 2018-09-09 DIAGNOSIS — R0789 Other chest pain: Secondary | ICD-10-CM

## 2018-09-09 DIAGNOSIS — M62838 Other muscle spasm: Secondary | ICD-10-CM

## 2018-09-09 MED ORDER — SODIUM CHLORIDE 0.9 % IV SOLN
Freq: Once | INTRAVENOUS | Status: AC
  Administered 2018-09-09: 08:00:00 via INTRAVENOUS
  Filled 2018-09-09: qty 250

## 2018-09-09 MED ORDER — BACLOFEN 10 MG PO TABS: 10.0000 mg | ORAL_TABLET | Freq: Three times a day (TID) | ORAL | 0 refills | Status: DC

## 2018-09-09 MED ORDER — DEXAMETHASONE SODIUM PHOSPHATE 10 MG/ML IJ SOLN
INTRAMUSCULAR | Status: AC
  Filled 2018-09-09: qty 1

## 2018-09-09 MED ORDER — SODIUM CHLORIDE 0.9 % IV SOLN
100.0000 mg/m2 | Freq: Once | INTRAVENOUS | Status: AC
  Administered 2018-09-09: 190 mg via INTRAVENOUS
  Filled 2018-09-09: qty 9.5

## 2018-09-09 MED ORDER — SODIUM CHLORIDE 0.9 % IV SOLN: 10.0000 mg | Freq: Once | INTRAVENOUS | Status: DC

## 2018-09-09 MED ORDER — DEXAMETHASONE SODIUM PHOSPHATE 10 MG/ML IJ SOLN
10.0000 mg | Freq: Once | INTRAMUSCULAR | Status: AC
  Administered 2018-09-09: 10 mg via INTRAVENOUS

## 2018-09-09 MED FILL — BACLOFEN 10 MG TABS: 10 | 10 days supply | Qty: 30 | Fill #0

## 2018-09-09 NOTE — Progress Notes (Signed)
Pt states she noted small amount of blood on toilet tissue this morning after a BM, hx of hemorrhoids.  Also states she had sudden onset of intermittent cramping in L lower abdomen at 0300 this a.m.  Shelia Media PA informed.

## 2018-09-09 NOTE — Progress Notes (Deleted)
Subjective:   PATIENT ID: Marissa [name] GENDER: female DOB: 01-11-1966, MRN: [id number]   HPI  No chief complaint on file.   Reason for Visit: Hospital follow-up   Marissa [name] is a 53 year old female with COPD/asthma who presents to clinic for follow-up for mediastinal adenopathy.  She was seen as a pulmonary consult on 07/30/2018 while admitted for acute hypoxemic respiratory failure secondary to suspected COPD exacerbation.  During her work-up CTA demonstrated incidental left upper lobe spiculated lung mass and left hilar and mediastinal lymphadenopathy.  Endorses significant smoking history of 78 pack years and remains an active smoker. She is cutting down on smoking. She is now smoking <1/4 ppd now. She is compliant with her home oxygen and  uses her nebulizer once daily. Denies shortness of breath or wheezing however she has limited her activity significantly.  Social History: She previously smoked 2 ppd x 40 years   Environmental exposures: None  I have personally reviewed patient's past medical/family/social history, allergies, current medications.  Past Medical History:  Diagnosis Date  . Asthma   . Chronic headaches   . COPD (chronic obstructive pulmonary disease) ([place])   . Depression   . Heart murmur      Family History  Problem Relation Age of Onset  . Heart disease Mother   . Dementia Father      Social History   Occupational History  . Not on file  Tobacco Use  . Smoking status: Current Every Day Smoker    Packs/day: 2.00    Years: 39.00    Pack years: 78.00    Types: Cigarettes    Start date: 06/22/1979  . Smokeless tobacco: Never Used  Substance and Sexual Activity  . Alcohol use: Yes    Comment: occasional  . Drug use: Not Currently    Types: Marijuana  . Sexual activity: Yes    Birth control/protection: Surgical    Allergies  Allergen Reactions  . Penicillins Nausea And Vomiting    Did it involve swelling of the face/tongue/throat, SOB, or  low BP? No Did it involve sudden or severe rash/hives, skin peeling, or any reaction on the inside of your mouth or nose? No Did you need to seek medical attention at a hospital or doctor's office? No When did it last happen?20 years If all above answers are "NO", may proceed with cephalosporin use.     Outpatient Medications Prior to Visit  Medication Sig Dispense Refill  . budesonide-formoterol (SYMBICORT) 160-4.5 MCG/ACT inhaler Inhale 2 puffs into the lungs 2 (two) times daily. 1 Inhaler 0  . butalbital-acetaminophen-caffeine (FIORICET) 50-325-40 MG tablet Take 1 tablet by mouth every 6 (six) hours as needed for headache. Patient will pick up scripts today. 60 tablet 3  . diazepam (VALIUM) 10 MG tablet Take 10 mg by mouth daily as needed for anxiety.    . gabapentin (NEURONTIN) 300 MG capsule Take 1 capsule (300 mg total) by mouth 3 (three) times daily. 90 capsule 0  . ipratropium-albuterol (DUONEB) 0.5-2.5 (3) MG/3ML SOLN Take 3 mLs by nebulization every 6 (six) hours as needed. 360 mL 0  . nicotine (NICODERM CQ - DOSED IN MG/24 HOURS) 21 mg/24hr patch Place 1 patch (21 mg total) onto the skin daily. Patient will pick up scripts today. 42 patch 0  . ondansetron (ZOFRAN) 8 MG tablet Take 1 tablet (8 mg total) by mouth 2 (two) times daily as needed. Start on the third day after cisplatin chemotherapy. 30 tablet 1  . oxyCODONE (  OXY IR/ROXICODONE) 5 MG immediate release tablet Take 1 tablet (5 mg total) by mouth every 6 (six) hours as needed for severe pain. 1-2 every 4 hrs as needed for pain 30 tablet 0  . predniSONE (DELTASONE) 20 MG tablet 2 tabs daily for 2 days, then 1 tab daily for 2 days, then 1/2 tab daily for 2 days 7 tablet 0  . prochlorperazine (COMPAZINE) 10 MG tablet Take 1 tablet (10 mg total) by mouth every 6 (six) hours as needed (Nausea or vomiting). 30 tablet 1  . Tiotropium Bromide Monohydrate (SPIRIVA RESPIMAT) 2.5 MCG/ACT AERS Inhale 2 puffs into the lungs daily. 4 g 0   . traZODone (DESYREL) 100 MG tablet Take 1 tablet (100 mg total) by mouth at bedtime. Patient will pick up scripts today. 90 tablet 2   Facility-Administered Medications Prior to Visit  Medication Dose Route Frequency Provider Last Rate Last Dose  . etoposide (VEPESID) 190 mg in sodium chloride 0.9 % 500 mL chemo infusion  100 mg/m2 (Treatment Plan Recorded) Intravenous Once [name], MD        Review of Systems  Constitutional: Negative for chills, diaphoresis, fever, malaise/fatigue and weight loss.  HENT: Positive for ear pain and sore throat. Negative for congestion.   Respiratory: Negative for cough, hemoptysis, sputum production, shortness of breath and wheezing.   Cardiovascular: Negative for chest pain (chest tightness), palpitations and leg swelling.  Gastrointestinal: Negative for abdominal pain, heartburn and nausea.  Genitourinary: Negative for frequency.  Musculoskeletal: Negative for joint pain and myalgias.  Skin: Negative for itching and rash.  Neurological: Positive for headaches. Negative for dizziness and weakness.  Endo/Heme/Allergies: Does not bruise/bleed easily.  Psychiatric/Behavioral: Negative for depression. The patient is nervous/anxious.      Objective:   There were no vitals filed for this visit.    Physical Exam: General: Well-appearing, no acute distress HENT: [place], AT, OP clear, MMM Eyes: EOMI, no scleral icterus Respiratory: Clear to auscultation bilaterally.  No crackles, wheezing or rales Cardiovascular: RRR, -M/R/G, no JVD GI: BS+, soft, nontender Extremities:-Edema,-tenderness Neuro: AAO x4, CNII-XII grossly intact Skin: Intact, no rashes or bruising Psych: Normal mood, normal affect  Data Reviewed:  Imaging: CTA 08/16/2018 - no pulmonary embolism present.  Left hilar and mediastinal lymphadenopathy.  9 mm spiculated nodule in the lingula  PFT: None on file  Labs: CBC and BMP 07/30/2018 reviewed.  Overall unremarkable Hg 9.3, platelets  447, glucose 135  Imaging, labs and tests noted above have been reviewed independently by me.    Assessment & Plan:    Discussion: 53 year old female with probable COPD who presents with hilar adenopathy and spiculated lung nodule. Per Fransisco Beau calculator, her solitary nodule has at least a 12% risk of malignancy.  Would recommend diagnostic testing.  We discussed multiple modes of tissue sampling including CT-guided biopsy, bronchoscopy and surgery.  Bronchoscopy would potentially be able to offer both diagnostic and staging. We discussed risks and benefits of each procedure including infection, bleeding and pneumothorax.  After discussion, patient wishes to pursue diagnostic bronchoscopy.  During this clinic visit coordinating care with clinical pharmacist for bronchodilator therapy and education for patient's obstructive lung disease.  Hilar adenopathy Will plan for bronchoscopy with endobronchial ultrasound for tissue sampling Do not eat or drink on the night or the morning prior to procedure starting at 12:00 AM (midnight) Please arrange for a driver to take you to the procedure and take you home afterwards PROCEDURE SCHEDULED 08/24/18 at 1:00 PM  COPD/Asthma Will  schedule pulmonary function test START Symbicort 2 puffs twice a day. Take this EVERYDAY START Spiriva 2 puffs once a day. Take this EVERYDAY  Chronic hypoxemic respiratory failure CONTINUE supplemental oxygen for goal SpO2 >88% Will perform ambulatory oxygen titration at next visit  Tobacco Abuse QUIT SMOKING  Follow-up with me in 2 weeks  Financial difficulty While she was inpatient, [facility] We will send message to clinic coordinator to evaluate for resources and assistance to apply for Medicaid  Greater than 50% of this patient 45-minute office visit was spent face-to-face in counseling with the patient/family. We discussed medical diagnosis and treatment plan as noted.   No orders of the defined types were placed in this encounter.  No orders of the defined types were placed in this encounter.   No follow-ups on file.  [place], MD [facility] 09/09/2018 8:41 AM  Office Number [phone]

## 2018-09-09 NOTE — Patient Instructions (Signed)
[facility] Discharge Instructions for Patients Receiving Chemotherapy  Today you received the following chemotherapy agents Etoposide (VP-16).  To help prevent nausea and vomiting after your treatment, we encourage you to take your nausea medication as prescribed.    If you develop nausea and vomiting that is not controlled by your nausea medication, call the clinic.   BELOW ARE SYMPTOMS THAT SHOULD BE REPORTED IMMEDIATELY:  *FEVER GREATER THAN 100.5 F  *CHILLS WITH OR WITHOUT FEVER  NAUSEA AND VOMITING THAT IS NOT CONTROLLED WITH YOUR NAUSEA MEDICATION  *UNUSUAL SHORTNESS OF BREATH  *UNUSUAL BRUISING OR BLEEDING  TENDERNESS IN MOUTH AND THROAT WITH OR WITHOUT PRESENCE OF ULCERS  *URINARY PROBLEMS  *BOWEL PROBLEMS  UNUSUAL RASH Items with * indicate a potential emergency and should be followed up as soon as possible.  Feel free to call the clinic should you have any questions or concerns. The clinic phone number is [phone].  Please show the [facility] at check-in to the Emergency Department and triage nurse.

## 2018-09-10 ENCOUNTER — Telehealth: Payer: Self-pay | Admitting: *Deleted

## 2018-09-10 NOTE — Progress Notes (Signed)
Order for Fulphila changed to Neulasta in attempt for pt assistance. Pt is self pay. Pt will need to sign application for pt assistance when she is here on Sat 09/11/18 for inj.  I have communicated this to inj nurse.  Kennith Center, Pharm.D., CPP 09/10/2018@9 :18 AM

## 2018-09-10 NOTE — Progress Notes (Signed)
[facility] Progress Note   Marissa [name] [id number] [birth year] 53 y.o.  Marissa [name] is managed by Dr. 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  She is receiving etoposide today.  She will return for follow-up on 09/15/2018.  Muscle spasms in the left lower quadrant: The patient was given a prescription for baclofen which she can use 3 times daily.  Left chest wall pain: This is an ongoing issue with this patient.  I reassured her that we are hopeful that her left chest wall pain will improve as she continues to titrate up her gabapentin.  Please see After Visit Summary for patient specific instructions.  Future Appointments  Date Time Provider [place]  09/11/2018  8:30 AM [facility] None  09/13/2018 11:15 AM Margaretha Seeds, MD [facility] None  09/13/2018  1:50 PM Gildardo Pounds, NP [facility] None  09/15/2018  8:15 AM [facility] 6 [facility] None  09/15/2018  8:45 AM Alla Feeling, NP [facility] None  09/21/2018 11:00 AM Kyung Rudd, MD [facility] None  09/28/2018  9:00 AM [facility] INFUSION [facility] None  09/29/2018  8:30 AM [facility] INFUSION [facility] None  09/30/2018  9:00 AM [facility] INFUSION [facility] None    No orders of the defined types were placed in this encounter.      Subjective:   Patient ID:  Marissa [name] is a 53 y.o. (DOB 17-Aug-1965) female.  Chief Complaint: No chief complaint on file.   HPI [name] is a 53 year old female with a diagnosis of a small cell carcinoma of the lung who is  managed by Dr. Burr Medico and is receiving cycle 1, day 48 of cisplatin and etoposide today with the patient receiving etoposide only.  She was seen in the infusion room today.  She has a history of ongoing left chest pain which is believed to be secondary to her history of a left chest tube.  She also reports that she is having left lower quadrant abdominal pain since this morning.  She also noted some bright red blood on the toilet paper with wiping after having a bowel movement. She denies fevers, chills, sweats, or changes in activity.  Medications: I have reviewed the patient's current medications.  Allergies:  Allergies  Allergen Reactions   Penicillins Nausea And Vomiting    Did it involve swelling of the face/tongue/throat, SOB, or low BP? No Did it involve sudden or severe rash/hives, skin peeling, or any reaction on the inside of your mouth or nose? No Did you need to seek medical attention at a hospital or doctor's office? No When did it last happen?20 years If all above answers are "NO", may proceed with cephalosporin use.    Past Medical History:  Diagnosis Date   Asthma    Chronic headaches    COPD (chronic obstructive pulmonary disease) ([place])    Depression    Heart murmur     Past Surgical History:  Procedure Laterality Date   BRONCHIAL BIOPSY  08/24/2018   Procedure: BRONCHIAL BIOPSIES;  Surgeon: Margaretha Seeds, MD;  Location: [facility];  Service: Cardiopulmonary;;  BRONCHIAL BRUSHINGS  08/24/2018   Procedure: BRONCHIAL BRUSHINGS;  Surgeon: Margaretha Seeds, MD;  Location: [facility];  Service: Cardiopulmonary;;   BRONCHIAL NEEDLE ASPIRATION BIOPSY  08/24/2018   Procedure: BRONCHIAL NEEDLE ASPIRATION BIOPSIES;  Surgeon: Margaretha Seeds, MD;  Location: [facility];  Service: Cardiopulmonary;;   ENDOBRONCHIAL ULTRASOUND N/A 08/24/2018   Procedure: ENDOBRONCHIAL ULTRASOUND;  Surgeon: Margaretha Seeds, MD;  Location: [facility];  Service:  Cardiopulmonary;  Laterality: N/A;   TUBAL LIGATION     VIDEO BRONCHOSCOPY  08/24/2018   Procedure: VIDEO BRONCHOSCOPY;  Surgeon: Margaretha Seeds, MD;  Location: [facility];  Service: Cardiopulmonary;;    Family History  Problem Relation Age of Onset   Heart disease Mother    Dementia Father     Social History   Socioeconomic History   Marital status: Single    Spouse name: Not on file   Number of children: Not on file   Years of education: Not on file   Highest education level: Not on file  Occupational History   Not on file  Social Needs   Financial resource strain: Not on file   Food insecurity    Worry: Not on file    Inability: Not on file   Transportation needs    Medical: Yes    Non-medical: Yes  Tobacco Use   Smoking status: Current Every Day Smoker    Packs/day: 2.00    Years: 39.00    Pack years: 78.00    Types: Cigarettes    Start date: 06/22/1979   Smokeless tobacco: Never Used  Substance and Sexual Activity   Alcohol use: Yes    Comment: occasional   Drug use: Not Currently    Types: Marijuana   Sexual activity: Yes    Birth control/protection: Surgical  Lifestyle   Physical activity    Days per week: Not on file    Minutes per session: Not on file   Stress: Not on file  Relationships   Social connections    Talks on phone: Not on file    Gets together: Not on file    Attends religious service: Not on file    Active member of club or organization: Not on file    Attends meetings of clubs or organizations: Not on file    Relationship status: Not on file   Intimate partner violence    Fear of current or ex partner: Not on file    Emotionally abused: Not on file    Physically abused: Not on file    Forced sexual activity: Not on file  Other Topics Concern   Not on file  Social History Narrative   Not on file    Past Medical History, Surgical history, Social history, and Family history were reviewed and updated as  appropriate.   Please see review of systems for further details on the patient's review from today.   Review of Systems:  Review of Systems  Constitutional: Negative for activity change, appetite change, chills, diaphoresis and fever.  HENT: Negative for trouble swallowing.   Respiratory: Negative for cough, chest tightness and shortness of breath.   Cardiovascular: Positive for chest pain. Negative for palpitations and leg swelling.  Gastrointestinal: Positive for abdominal pain and anal bleeding. Negative for abdominal distention, constipation, diarrhea, nausea and vomiting.    Objective:   Physical Exam:  LMP 04/24/2012  ECOG: 1  Physical Exam Constitutional:      General: She is not in acute distress.  Appearance: She is not diaphoretic.     Comments: The patient is receiving oxygen via nasal cannula.  HENT:     Head: Normocephalic and atraumatic.     Mouth/Throat:     Pharynx: No oropharyngeal exudate.  Neck:     Musculoskeletal: Normal range of motion and neck supple.  Cardiovascular:     Rate and Rhythm: Normal rate and regular rhythm.     Heart sounds: Normal heart sounds. No murmur. No friction rub. No gallop.   Pulmonary:     Effort: Pulmonary effort is normal. No respiratory distress.     Breath sounds: Normal breath sounds. No wheezing or rales.  Abdominal:     General: Bowel sounds are normal. There is no distension.     Palpations: Abdomen is soft. There is no mass.     Tenderness: There is abdominal tenderness. There is no guarding or rebound.     Comments: There is tenderness in the left lower quadrant in the area of induration along the muscle consistent with muscle spasms.  Lymphadenopathy:     Cervical: No cervical adenopathy.  Skin:    General: Skin is warm and dry.     Findings: No erythema or rash.  Neurological:     Mental Status: She is alert.     Coordination: Coordination normal.     Lab Review:     Component Value Date/Time   NA 141  09/06/2018 1125   K 3.8 09/06/2018 1125   CL 105 09/06/2018 1125   CO2 25 09/06/2018 1125   GLUCOSE 113 (H) 09/06/2018 1125   BUN 7 09/06/2018 1125   CREATININE 0.69 09/06/2018 1125   CALCIUM 9.6 09/06/2018 1125   PROT 7.3 09/06/2018 1125   ALBUMIN 3.3 (L) 09/06/2018 1125   AST 29 09/06/2018 1125   ALT 60 (H) 09/06/2018 1125   ALKPHOS 161 (H) 09/06/2018 1125   BILITOT 0.2 (L) 09/06/2018 1125   GFRNONAA >60 09/06/2018 1125   GFRAA >60 09/06/2018 1125       Component Value Date/Time   WBC 11.0 (H) 09/06/2018 1125   WBC 9.5 07/30/2018 0247   RBC 4.38 09/06/2018 1125   HGB 10.5 (L) 09/06/2018 1125   HCT 37.2 09/06/2018 1125   PLT 389 09/06/2018 1125   MCV 84.9 09/06/2018 1125   MCH 24.0 (L) 09/06/2018 1125   MCHC 28.2 (L) 09/06/2018 1125   RDW 26.2 (H) 09/06/2018 1125   LYMPHSABS 1.2 09/06/2018 1125   MONOABS 0.9 09/06/2018 1125   EOSABS 0.1 09/06/2018 1125   BASOSABS 0.1 09/06/2018 1125   -------------------------------  Imaging from last 24 hours (if applicable):  Radiology interpretation: Mr Jeri Cos HF Contrast  Result Date: 08/27/2018 CLINICAL DATA:  Staging of newly diagnosed small-cell lung cancer. Worsening headaches. EXAM: MRI HEAD WITHOUT AND WITH CONTRAST TECHNIQUE: Multiplanar, multiecho pulse sequences of the brain and surrounding structures were obtained without and with intravenous contrast. CONTRAST:  9 mL Gadavist COMPARISON:  None. FINDINGS: The patient terminated the examination prior to completion due to pain and claustrophobia. The study is mildly to moderately motion degraded, and only an axial T1 postcontrast sequence was obtained (planned sagittal and coronal postcontrast T1 imaging was not obtained). Brain: There is a 6 mm focus of mild trace diffusion hyperintensity (versus 2 smaller adjacent foci) medially in the left cerebellar hemisphere with normal ADC, at most subtle T2 hyperintensity, and no appreciable enhancement on the motion degraded axial T1  postcontrast series. No abnormal intracranial enhancement is identified  elsewhere. No intracranial hemorrhage, midline shift, or extra-axial fluid collection is evident. The ventricles and sulci are normal. No significant cerebral white matter disease is seen. Vascular: Major intracranial vascular flow voids are preserved. Skull and upper cervical spine: Diminished bone marrow T1 signal intensity in the calvarium and upper cervical spine diffusely, nonspecific though can be seen with anemia, smoking, and obesity. Sinuses/Orbits: Unremarkable orbits. No significant inflammatory changes in the paranasal sinuses or mastoid air cells. Other: None. IMPRESSION: 1. Motion degraded, incomplete examination. 2. Subcentimeter focus of diffusion signal abnormality in the left cerebellum. No enhancement is evident to strongly suggest a metastasis, and this may reflect an acute to subacute small vessel infarct. 3. No evidence of intracranial metastatic disease elsewhere. 4. Given motion artifact and incomplete postcontrast imaging, consider short-term follow-up MRI (possibly with sedation) to further evaluate the left cerebellar abnormality and provide a more thorough evaluation for metastatic disease. Electronically Signed   By: Logan Bores M.D.   On: 08/27/2018 12:01   Ct Abdomen Pelvis W Contrast  Result Date: 08/26/2018 CLINICAL DATA:  Small-cell lung cancer in the lingula.  Staging. EXAM: CT ABDOMEN AND PELVIS WITH CONTRAST TECHNIQUE: Multidetector CT imaging of the abdomen and pelvis was performed using the standard protocol following bolus administration of intravenous contrast. CONTRAST:  198mL OMNIPAQUE IOHEXOL 300 MG/ML SOLN, 45mL OMNIPAQUE IOHEXOL 300 MG/ML SOLN COMPARISON:  07/29/2018 FINDINGS: Lower chest: Heart appears enlarged. There is left base atelectasis with small left effusion. Left pleural catheter identified but incompletely visualized. There is some gas in the left chest wall consistent with the  presence of the pleural drain. Hepatobiliary: No suspicious focal abnormality within the liver parenchyma. There is no evidence for gallstones, gallbladder wall thickening, or pericholecystic fluid. No intrahepatic or extrahepatic biliary dilation. Pancreas: No focal mass lesion. No dilatation of the main duct. No intraparenchymal cyst. No peripancreatic edema. Spleen: No splenomegaly. No focal mass lesion. Adrenals/Urinary Tract: Right adrenal gland unremarkable. 2.9 cm left adrenal nodule shows heterogeneous enhancement and a relative washout value of 34%, indeterminate for adenoma. Metastatic lesion not excluded. Kidneys unremarkable. No evidence for hydroureter. The urinary bladder appears normal for the degree of distention. Stomach/Bowel: Stomach is unremarkable. No gastric wall thickening. No evidence of outlet obstruction. Duodenum is normally positioned as is the ligament of Treitz. No small bowel wall thickening. No small bowel dilatation. The terminal ileum is normal. The appendix is normal. No gross colonic mass. No colonic wall thickening. Diverticular changes are noted in the left colon without evidence of diverticulitis. Vascular/Lymphatic: There is abdominal aortic atherosclerosis without aneurysm. There is no gastrohepatic or hepatoduodenal ligament lymphadenopathy. No intraperitoneal or retroperitoneal lymphadenopathy. No pelvic sidewall lymphadenopathy. Reproductive: The uterus is unremarkable.  There is no adnexal mass. Other: No intraperitoneal free fluid. Musculoskeletal: No worrisome lytic or sclerotic osseous abnormality. IMPRESSION: 1. 2.9 cm left adrenal nodule cannot be definitively characterized on this study. Potentially a lipid poor adenoma, but metastatic disease not excluded. MRI of the abdomen with and without contrast may prove helpful to further evaluate. 2. No other findings to suggest metastatic disease in the abdomen/pelvis. 3. Left base atelectasis with small left pleural  effusion. Electronically Signed   By: Misty Stanley M.D.   On: 08/26/2018 16:43   Nm Pet Image Restag (ps) Skull Base To Thigh  Result Date: 09/03/2018 CLINICAL DATA:  Initial treatment strategy for Lung cancer, small cell. EXAM: NUCLEAR MEDICINE PET SKULL BASE TO THIGH TECHNIQUE: 9.1 mCi F-18 FDG was injected intravenously. Full-ring PET imaging  was performed from the skull base to thigh after the radiotracer. CT data was obtained and used for attenuation correction and anatomic localization. Fasting blood glucose: 95 mg/dl COMPARISON:  CT chest 07/29/2018 FINDINGS: Mediastinal blood pool activity: SUV max 2.55 Liver activity: SUV max NA NECK: No hypermetabolic lymph nodes in the neck. Asymmetric increased uptake to the left mandible is identified within SUV max of 8.02. On the corresponding CT images there is what appears to be a large dental cavity and possible periapical abscess. Incidental CT findings: none CHEST: Left hilar lung mass is identified the measuring approximately 5 cm with SUV max of 14.1 Simmons. There is encasement and narrowing of the distal left mainstem bronchus. Postobstructive atelectasis involving the anteromedial left upper lobe is identified. Large left-sided pre-vascular nodal mass measures 4.3 x 5.8 cm with SUV max of 14.17. Hypermetabolic left paratracheal lymph node measures 1.4 cm and has an SUV max of 13.4. Within the superior mediastinum there is a left-sided pre-vascular node measuring 1.6 cm with SUV max of 7.07. No FDG avid subcarinal, right paratracheal or right hilar lymph nodes identified. Morphologically benign appearing bilateral axillary lymph nodes exhibit mild FDG uptake. For example, a 9 mm left axillary lymph node with benign fatty hilum has an SUV max of 2.8. 8 mm right axillary lymph node with preservation of fatty hilum has a SUV max of 3.8. Loculated left pleural effusion extends along the oblique fissure and over the left apex. Increased FDG uptake within the  dependent portion of the effusion has increased FDG uptake with SUV max of 3.65 concerning for malignant effusion. Tiny nodule in the right apex measures 2 mm is too small to characterize by PET-CT. Incidental CT findings: Aortic atherosclerosis. ABDOMEN/PELVIS: No abnormal hypermetabolic activity within the liver, pancreas, adrenal glands, or spleen. No hypermetabolic lymph nodes in the abdomen or pelvis. Incidental CT findings: Nodule in the left adrenal gland measures 2.9 x 2.0 cm with SUV max of 2.76. This measures -1.38 mean Hounsfield units and is favored to represent a benign adenoma. SKELETON: No focal hypermetabolic activity to suggest skeletal metastasis. Incidental CT findings: none IMPRESSION: 1. Hypermetabolic left hilar mass is identified encasing the left mainstem bronchus resulting in postobstructive atelectasis in the left upper lobe. 2. Hypermetabolic left mediastinal nodal metastasis. 3. Loculated left pleural effusion is identified with mild increased FDG uptake. Cannot rule out malignant pleural effusion. 4. No evidence for metastatic disease to the abdomen or pelvis or skeletal structures. Electronically Signed   By: Kerby Moors M.D.   On: 09/03/2018 08:28   Dg Chest Port 1 View  Result Date: 08/27/2018 CLINICAL DATA:  Follow-up pneumothorax EXAM: PORTABLE CHEST 1 VIEW COMPARISON:  August 27, 2018 FINDINGS: Again noted is left upper lobe collapse. No pneumothorax is seen. There is stable aeration of the left lung. The right lung remains clear. The cardiomediastinal silhouette is unchanged. IMPRESSION: 1. No pneumothorax. 2. Unchanged left upper lobe collapse due to hilar mass. Electronically Signed   By: Prudencio Pair M.D.   On: 08/27/2018 17:42   Dg Chest Port 1 View  Result Date: 08/27/2018 CLINICAL DATA:  Chest tube removal EXAM: PORTABLE CHEST 1 VIEW COMPARISON:  Same-day radiograph FINDINGS: Interval removal of a left pigtail thoracostomy tube. No discernible pneumothorax.  Wedge-shaped opacity persists at the left lung apex related to left upper lobe collapse related to underlying obstructing mass. Right lung remains clear. No pleural effusion. IMPRESSION: 1. Interval removal of left-sided chest tube. No pneumothorax identified. 2. Persistent  left upper lobe collapse related to obstructing mass. Electronically Signed   By: Davina Poke M.D.   On: 08/27/2018 13:31   Dg Chest Port 1 View  Result Date: 08/27/2018 CLINICAL DATA:  53 year old female with a history of pneumothorax and a left-sided chest tube in place EXAM: PORTABLE CHEST 1 VIEW COMPARISON:  Prior chest x-ray 08/26/2018 FINDINGS: Stable position of left apical pigtail thoracostomy tube. No evidence of recurrent pneumothorax. Persistent left upper lobe opacity likely reflecting collapse of the left upper lobe secondary to an underlying obstructing mass. The right lung remains clear. No acute osseous abnormality. IMPRESSION: 1. Stable position of left pigtail thoracostomy tube. 2. No evidence of recurrent pneumothorax. 3. Persistent left hilar obstructing mass and resultant left upper lobe collapse. Electronically Signed   By: Jacqulynn Cadet M.D.   On: 08/27/2018 10:08   Dg Chest Port 1 View  Result Date: 08/26/2018 CLINICAL DATA:  Follow-up pneumothorax EXAM: PORTABLE CHEST 1 VIEW COMPARISON:  August 26, 2018 8:27 a.m. FINDINGS: Again noted is a left-sided pigtail catheter. Left upper lobe collapse is again identified. There is slightly improved aeration of the remainder of the left lung. The right lung remains clear. The cardiomediastinal silhouette is unchanged. IMPRESSION: Interval slight improvement in left lung aeration with stable left upper lobe collapse Electronically Signed   By: Prudencio Pair M.D.   On: 08/26/2018 18:19   Dg Chest Port 1 View  Result Date: 08/26/2018 CLINICAL DATA:  Follow-up pneumothorax EXAM: PORTABLE CHEST 1 VIEW COMPARISON:  Yesterday FINDINGS: Left chest tube in stable position.  Continued left upper lobe collapse. There is improving left lung aeration though. The right lung is clear. Cardiomegaly. IMPRESSION: Improved left-sided aeration but persistent left upper lobe collapse. No visible pneumothorax. Cardiomegaly. Electronically Signed   By: Monte Fantasia M.D.   On: 08/26/2018 10:58   Dg Chest Port 1 View  Result Date: 08/25/2018 CLINICAL DATA:  Pneumothorax. EXAM: PORTABLE CHEST 1 VIEW COMPARISON:  Chest x-rays dated 08/24/2018 FINDINGS: Left chest tube is unchanged in position. No visible residual pneumothorax. However the, there is increased volume loss and consolidation in the left upper and lower lung zones. Subcutaneous emphysema on the left has almost completely resolved. Chronic cardiomegaly. Right lung is clear. Pulmonary vascularity on the right is normal. No acute bone abnormality. 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 IMPRESSION: 1. Increasing volume loss and consolidation in the left upper and lower lung zones. 2. No residual pneumothorax. Electronically Signed   By: Lorriane Shire M.D.   On: 08/25/2018 08:20   Dg Chest Port 1 View  Result Date: 08/24/2018 CLINICAL DATA:  Chest tube placement EXAM: PORTABLE CHEST 1 VIEW COMPARISON:  Portable exam 1701 hours compared to 1552 hours FINDINGS: Interval placement of a pigtail LEFT thoracostomy tube. Resolution of previously identified large LEFT tension pneumothorax. Persistent atelectasis in LEFT upper lobe and portions of LEFT lower lobe. Resolution of mediastinal shift. Small amount of LEFT lateral chest wall gas. Heart size upper normal. Subsegmental atelectasis RIGHT lung base. IMPRESSION: Resolution of LEFT tension pneumothorax following thoracostomy tube insertion. Persistent significant atelectasis of LEFT lung and minimal subsegmental atelectasis at RIGHT lung base. Electronically Signed   By: Lavonia Dana M.D.   On: 08/24/2018 17:29   Dg Chest Port 1 View  Result Date: 08/24/2018 CLINICAL  DATA:  Shortness of breath.  Status post bronchoscopy.  5 EXAM: PORTABLE CHEST 1 VIEW COMPARISON:  Chest x-ray dated 03/29/2018 FINDINGS: The patient has a large left  tension pneumothorax with shift of the heart mediastinal structures to the right. There is chronic cardiomegaly. Pulmonary vascularity is normal. Right lung is clear. The left lung has almost completely collapsed. No acute bone abnormality. IMPRESSION: Large left tension pneumothorax. Critical Value/emergent results were called by telephone at the time of interpretation on 08/24/2018 at 4:36 pm to [place], [place] , who verbally acknowledged these results. Electronically Signed   By: Lorriane Shire M.D.   On: 08/24/2018 16:38        This case was discussed with Dr. Burr Medico. She expressed agreement with my management of this patient.

## 2018-09-11 ENCOUNTER — Inpatient Hospital Stay: Payer: Medicaid Other

## 2018-09-11 ENCOUNTER — Emergency Department (HOSPITAL_COMMUNITY): Payer: Medicaid Other

## 2018-09-11 ENCOUNTER — Encounter (HOSPITAL_COMMUNITY): Payer: Self-pay

## 2018-09-11 ENCOUNTER — Other Ambulatory Visit: Payer: Self-pay

## 2018-09-11 ENCOUNTER — Emergency Department (HOSPITAL_COMMUNITY)
Admission: EM | Admit: 2018-09-11 | Discharge: 2018-09-11 | Disposition: A | Discharge status: Home / Self Care | Payer: Medicaid Other | Attending: Emergency Medicine | Admitting: Emergency Medicine

## 2018-09-11 VITALS — BP 128/54 | HR 88 | Temp 98.4°F | Resp 24

## 2018-09-11 DIAGNOSIS — J449 Chronic obstructive pulmonary disease, unspecified: Secondary | ICD-10-CM | POA: Diagnosis not present

## 2018-09-11 DIAGNOSIS — J45909 Unspecified asthma, uncomplicated: Secondary | ICD-10-CM | POA: Insufficient documentation

## 2018-09-11 DIAGNOSIS — F17210 Nicotine dependence, cigarettes, uncomplicated: Secondary | ICD-10-CM | POA: Insufficient documentation

## 2018-09-11 DIAGNOSIS — C3412 Malignant neoplasm of upper lobe, left bronchus or lung: Secondary | ICD-10-CM | POA: Diagnosis not present

## 2018-09-11 DIAGNOSIS — Z20828 Contact with and (suspected) exposure to other viral communicable diseases: Secondary | ICD-10-CM | POA: Insufficient documentation

## 2018-09-11 DIAGNOSIS — Z79899 Other long term (current) drug therapy: Secondary | ICD-10-CM | POA: Insufficient documentation

## 2018-09-11 DIAGNOSIS — Z5111 Encounter for antineoplastic chemotherapy: Secondary | ICD-10-CM | POA: Diagnosis not present

## 2018-09-11 DIAGNOSIS — J910 Malignant pleural effusion: Secondary | ICD-10-CM

## 2018-09-11 DIAGNOSIS — R0789 Other chest pain: Secondary | ICD-10-CM | POA: Diagnosis present

## 2018-09-11 LAB — BASIC METABOLIC PANEL
Anion gap: 8 (ref 5–15)
BUN: 16 mg/dL (ref 6–20)
CO2: 32 mmol/L (ref 22–32)
Calcium: 8.3 mg/dL — ABNORMAL LOW (ref 8.9–10.3)
Chloride: 98 mmol/L (ref 98–111)
Creatinine, Ser: 0.6 mg/dL (ref 0.44–1.00)
GFR calc Af Amer: >60 mL/min (ref >60)
GFR calc non Af Amer: >60 mL/min (ref >60)
Glucose, Bld: 102 mg/dL — ABNORMAL HIGH (ref 70–99)
Potassium: 3.4 mmol/L — ABNORMAL LOW (ref 3.5–5.1)
Sodium: 138 mmol/L (ref 135–145)

## 2018-09-11 LAB — CBC WITH DIFFERENTIAL/PLATELET
Abs Immature Granulocytes: 0.02 10*3/uL (ref 0.00–0.07)
Basophils Absolute: 0 10*3/uL (ref 0.0–0.1)
Basophils Relative: 1 %
Eosinophils Absolute: 0 10*3/uL (ref 0.0–0.5)
Eosinophils Relative: 1 %
HCT: 34.2 % — ABNORMAL LOW (ref 36.0–46.0)
Hemoglobin: 9.6 g/dL — ABNORMAL LOW (ref 12.0–15.0)
Immature Granulocytes: 0 %
Lymphocytes Relative: 26 %
Lymphs Abs: 1.3 10*3/uL (ref 0.7–4.0)
MCH: 24.3 pg — ABNORMAL LOW (ref 26.0–34.0)
MCHC: 28.1 g/dL — ABNORMAL LOW (ref 30.0–36.0)
MCV: 86.6 fL (ref 80.0–100.0)
Monocytes Absolute: 0.1 10*3/uL (ref 0.1–1.0)
Monocytes Relative: 1 %
Neutro Abs: 3.5 10*3/uL (ref 1.7–7.7)
Neutrophils Relative %: 71 %
Platelets: 378 10*3/uL (ref 150–400)
RBC: 3.95 MIL/uL (ref 3.87–5.11)
RDW: 25.6 % — ABNORMAL HIGH (ref 11.5–15.5)
WBC: 5 10*3/uL (ref 4.0–10.5)
nRBC: 0 % (ref 0.0–0.2)

## 2018-09-11 LAB — SARS CORONAVIRUS 2 BY RT PCR (HOSPITAL ORDER, PERFORMED IN ~~LOC~~ HOSPITAL LAB): SARS Coronavirus 2: NEGATIVE

## 2018-09-11 MED ORDER — PEGFILGRASTIM INJECTION 6 MG/0.6ML ~~LOC~~
6.0000 mg | PREFILLED_SYRINGE | Freq: Once | SUBCUTANEOUS | Status: AC
  Administered 2018-09-11: 6 mg via SUBCUTANEOUS

## 2018-09-11 MED ORDER — IOHEXOL 300 MG/ML  SOLN
75.0000 mL | Freq: Once | INTRAMUSCULAR | Status: AC | PRN
  Administered 2018-09-11: 75 mL via INTRAVENOUS

## 2018-09-11 MED ORDER — OXYCODONE-ACETAMINOPHEN 5-325 MG PO TABS
2.0000 | ORAL_TABLET | Freq: Once | ORAL | Status: AC
  Administered 2018-09-11: 2 via ORAL
  Filled 2018-09-11: qty 2

## 2018-09-11 MED ORDER — PEGFILGRASTIM INJECTION 6 MG/0.6ML ~~LOC~~
PREFILLED_SYRINGE | SUBCUTANEOUS | Status: AC
  Filled 2018-09-11: qty 0.6

## 2018-09-11 MED ORDER — SODIUM CHLORIDE (PF) 0.9 % IJ SOLN
INTRAMUSCULAR | Status: AC
  Filled 2018-09-11: qty 50

## 2018-09-11 MED ORDER — MORPHINE SULFATE (PF) 4 MG/ML IV SOLN
4.0000 mg | Freq: Once | INTRAVENOUS | Status: AC
  Administered 2018-09-11: 4 mg via INTRAVENOUS
  Filled 2018-09-11: qty 1

## 2018-09-11 NOTE — ED Triage Notes (Signed)
From [facility], came in this morning for therapy, c/o increasing SOB since last, night, c/o pain left shoulder. Pt had left sided pneumothorax during biopsy procedure on July 28th. Pt had Neulasta injection this morning.

## 2018-09-11 NOTE — ED Provider Notes (Signed)
[facility] DEPT Provider Note   CSN: [id number] Arrival date & time: 09/11/18  0845     History   Chief Complaint Chief Complaint  Patient presents with  . Shoulder Pain    left    HPI Marissa [name] is a 53 y.o. female.     Patient with history of O2 dependent COPD, continuous smoker, left lung CA presents with left sided chest pain affecting her left shoulder extending to below her breast and to the lateral chest wall. She c/o SOB as well. She reports symptoms are similar to recent PTX that was s/p lung biopsy on 08/22/18. She was admitted at that time with chest tube placement. Chest tube was removed on 08/26/18. She reports subsequent pain and soreness but pain has steadily increased over the last 3-4 days. No cough, fever, nausea, vomiting.   The history is provided by the patient. No language interpreter was used.  Shoulder Pain Associated symptoms: no fever     Past Medical History:  Diagnosis Date  . Asthma   . Chronic headaches   . COPD (chronic obstructive pulmonary disease) ([place])   . Depression   . Heart murmur     Patient Active Problem List   Diagnosis Date Noted  . 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 Pneumothorax 08/24/2018  . Pneumothorax after biopsy 08/24/2018  . Lymphadenopathy, hilar 08/20/2018  . Nodule of left lung 08/20/2018  . Mediastinal lymphadenopathy 08/20/2018  . Respiratory failure with hypoxia ([place]) 07/29/2018  . Headache 07/29/2018  . Microcytic anemia 07/29/2018  . Tobacco abuse 07/29/2018    Past Surgical History:  Procedure Laterality Date  . BRONCHIAL BIOPSY  08/24/2018   Procedure: BRONCHIAL BIOPSIES;  Surgeon: Margaretha Seeds, MD;  Location: [facility];  Service: Cardiopulmonary;;  . BRONCHIAL BRUSHINGS  08/24/2018   Procedure: BRONCHIAL BRUSHINGS;  Surgeon: Margaretha Seeds, MD;  Location: [facility];  Service: Cardiopulmonary;;  . BRONCHIAL NEEDLE ASPIRATION BIOPSY   08/24/2018   Procedure: BRONCHIAL NEEDLE ASPIRATION BIOPSIES;  Surgeon: Margaretha Seeds, MD;  Location: [facility];  Service: Cardiopulmonary;;  . ENDOBRONCHIAL ULTRASOUND N/A 08/24/2018   Procedure: ENDOBRONCHIAL ULTRASOUND;  Surgeon: Margaretha Seeds, MD;  Location: [facility];  Service: Cardiopulmonary;  Laterality: N/A;  . TUBAL LIGATION    . VIDEO BRONCHOSCOPY  08/24/2018   Procedure: VIDEO BRONCHOSCOPY;  Surgeon: Margaretha Seeds, MD;  Location: [facility];  Service: Cardiopulmonary;;     OB History   No obstetric history on file.      Home Medications    Prior to Admission medications   Medication Sig Start Date End Date Taking? Authorizing Provider  baclofen (LIORESAL) 10 MG tablet Take 1 tablet (10 mg total) by mouth 3 (three) times daily. 09/09/18   Tanner, Lyndon Code., PA-C  budesonide-formoterol (SYMBICORT) 160-4.5 MCG/ACT inhaler Inhale 2 puffs into the lungs 2 (two) times daily. 08/18/18   Margaretha Seeds, MD  butalbital-acetaminophen-caffeine (FIORICET) [phone] MG tablet Take 1 tablet by mouth every 6 (six) hours as needed for headache. Patient will pick up scripts today. 08/16/18 09/15/18  Gildardo Pounds, NP  diazepam (VALIUM) 10 MG tablet Take 10 mg by mouth daily as needed for anxiety.    [provider]  gabapentin (NEURONTIN) 300 MG capsule Take 1 capsule (300 mg total) by mouth 3 (three) times daily. 09/03/18   [name], MD  ipratropium-albuterol (DUONEB) 0.5-2.5 (3) MG/3ML SOLN Take 3 mLs by nebulization every 6 (six)  hours as needed. 08/27/18   Donita Brooks, NP  nicotine (NICODERM CQ - DOSED IN MG/24 HOURS) 21 mg/24hr patch Place 1 patch (21 mg total) onto the skin daily. Patient will pick up scripts today. 08/16/18 09/27/18  Gildardo Pounds, NP  ondansetron (ZOFRAN) 8 MG tablet Take 1 tablet (8 mg total) by mouth 2 (two) times daily as needed. Start on the third day after cisplatin chemotherapy. 09/03/18   [name], MD  oxyCODONE (OXY IR/ROXICODONE) 5 MG  immediate release tablet Take 1 tablet (5 mg total) by mouth every 6 (six) hours as needed for severe pain. 1-2 every 4 hrs as needed for pain 09/08/18   [name], MD  predniSONE (DELTASONE) 20 MG tablet 2 tabs daily for 2 days, then 1 tab daily for 2 days, then 1/2 tab daily for 2 days 08/27/18   Donita Brooks, NP  prochlorperazine (COMPAZINE) 10 MG tablet Take 1 tablet (10 mg total) by mouth every 6 (six) hours as needed (Nausea or vomiting). 09/03/18   [name], MD  Tiotropium Bromide Monohydrate (SPIRIVA RESPIMAT) 2.5 MCG/ACT AERS Inhale 2 puffs into the lungs daily. 08/18/18   Margaretha Seeds, MD  traZODone (DESYREL) 100 MG tablet Take 1 tablet (100 mg total) by mouth at bedtime. Patient will pick up scripts today. 08/16/18 11/14/18  Gildardo Pounds, NP    Family History Family History  Problem Relation Age of Onset  . Heart disease Mother   . Dementia Father     Social History Social History   Tobacco Use  . Smoking status: Current Every Day Smoker    Packs/day: 2.00    Years: 39.00    Pack years: 78.00    Types: Cigarettes    Start date: 06/22/1979  . Smokeless tobacco: Never Used  Substance Use Topics  . Alcohol use: Yes    Comment: occasional  . Drug use: Not Currently    Types: Marijuana     Allergies   Penicillins   Review of Systems Review of Systems  Constitutional: Negative for chills and fever.  HENT: Negative.   Respiratory: Positive for shortness of breath.   Cardiovascular: Positive for chest pain.  Gastrointestinal: Negative.  Negative for abdominal pain and nausea.  Musculoskeletal: Negative.   Skin: Negative.   Neurological: Negative.      Physical Exam Updated Vital Signs BP 127/61 (BP Location: Right Arm)   Pulse 77   Temp 98.5 F (36.9 C) (Oral)   Resp (!) 21   LMP 04/24/2012   SpO2 98%   Physical Exam Constitutional:      Appearance: She is well-developed.  HENT:     Head: Normocephalic.  Neck:     Musculoskeletal: Normal  range of motion and neck supple.  Cardiovascular:     Rate and Rhythm: Normal rate and regular rhythm.  Pulmonary:     Effort: Pulmonary effort is normal.     Breath sounds: Normal breath sounds.     Comments: Breath sounds present in all left fields.  Abdominal:     General: Bowel sounds are normal.     Palpations: Abdomen is soft.     Tenderness: There is no abdominal tenderness. There is no guarding or rebound.  Musculoskeletal: Normal range of motion.     Left lower leg: No edema.  Skin:    General: Skin is warm and dry.     Findings: No rash.  Neurological:     Mental Status: She is alert  and oriented to person, place, and time.      ED Treatments / Results  Labs (all labs ordered are listed, but only abnormal results are displayed) Labs Reviewed - No data to display  EKG None  Radiology No results found.  Procedures Procedures (including critical care time)  Medications Ordered in ED Medications  oxyCODONE-acetaminophen (PERCOCET/ROXICET) 5-325 MG per tablet 2 tablet (2 tablets Oral Given 09/11/18 0919)     Initial Impression / Assessment and Plan / ED Course  I have reviewed the triage vital signs and the nursing notes.  Pertinent labs & imaging results that were available during my care of the patient were reviewed by me and considered in my medical decision making (see chart for details).        Patient to ED with c/o left upper and lateral chest pain, progressively worse over 2-3 days. No fever. H/o recently diagnosed [place] lung cancer left upper lobe, PTX during biopsy, discharged home 7/31.   CXR significantly abnormal, however, little change since previous. ?PTX. Discussed findings with Dr. Orvan Seen, CT surgery, who requests CT scan to verify.   CT chest c/w malignant pleural effusion but no PTX. Results with reviewed with Dr. Orvan Seen who does not feel drainage of the effusion would provide significant benefit.   Recheck - patient is comfortable, sitting  up, in NAD. VSS. No hypoxia.   Discussed patient's condition with Dr. Burr Medico who feels she can go home with in-office follow up this week.   Final Clinical Impressions(s) / ED Diagnoses   Final diagnoses:  None   1. Chest pain 2. Left lung CA  ED Discharge Orders    None       Charlann Lange, Hershal Coria 09/11/18 1316    Isla Pence, MD 09/11/18 1408

## 2018-09-11 NOTE — ED Triage Notes (Signed)
Patient reports 10/10 pain in left shoulder. Stated last time this happened her lung was collapsed. Pain started last night. Vitals 127/61, o2 sat 98 3L [place] (on chronic o2), Hr 77, resp 23.

## 2018-09-11 NOTE — Discharge Instructions (Addendum)
You can increase your pain medication to 1-2 tablets for better control of pain.   You can be discharged home and should follow up with Dr. Burr Medico in the outpatient setting. Return to the emergency department with any high fever, severe pain or new concern.

## 2018-09-11 NOTE — Progress Notes (Signed)
Ms. Sigman presented to infusion room for Neulasta injection. She c/o progressive dyspnea at rest and increasing left shoulder pain since last night. Patient tachypneic with increased WOB. States her symptoms feel similar to when she recently developed pneumothorax. Informed Dr. Alen Blew of patient's complaints. Advised to have patient evaluated in ED. Patient aware and agrees to plan. Patient escorted to ED via [place] with O2 at 3Lpm. Report given to ED staff and care turned over to same without further incident.

## 2018-09-12 NOTE — Progress Notes (Deleted)
Subjective:   PATIENT ID: Marissa [name] GENDER: female DOB: 1965-10-13, MRN: [id number]   HPI  No chief complaint on file.   Reason for Visit:  Follow-up  Ms. Marissa [name] is a 53 year old female active smoker  with COPD/asthma who presents for follow-up.   She underwent EBUS on 2/77/41 which was complicated by pneumothorax secondary to prolonged mechanical ventilation. 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  Currently she does/does not smoke*** She is compliant/not compliant with her home oxygen***  Social History: She previously smoked 2 ppd x 40 years   Environmental exposures: None  I have personally reviewed patient's past medical/family/social history/allergies/current medications.  Past Medical History:  Diagnosis Date  . Asthma   . Chronic headaches   . COPD (chronic obstructive pulmonary disease) ([place])   . Depression   . Heart murmur      Family History  Problem Relation Age of Onset  . Heart disease Mother   . Dementia Father      Social History   Occupational History  . Not on file  Tobacco Use  . Smoking status: Current Every Day Smoker    Packs/day: 2.00    Years: 39.00    Pack years: 78.00    Types: Cigarettes    Start date: 06/22/1979  . Smokeless tobacco: Never Used  Substance and Sexual Activity  . Alcohol use: Yes    Comment: occasional  . Drug use: Not Currently    Types: Marijuana  . Sexual activity: Yes    Birth control/protection: Surgical    Allergies  Allergen Reactions  . Penicillins Nausea And Vomiting    Did it involve swelling of the face/tongue/throat, SOB, or low BP? No Did it involve sudden or severe rash/hives, skin peeling, or any reaction on the inside of your mouth or nose? No Did you need to seek medical attention at a hospital or doctor's office? No When did it last happen?20 years If all above answers are "NO", may proceed with cephalosporin use.     Outpatient  Medications Prior to Visit  Medication Sig Dispense Refill  . acetaminophen (TYLENOL) 500 MG tablet Take 500 mg by mouth every 6 (six) hours as needed for mild pain or headache.    . baclofen (LIORESAL) 10 MG tablet Take 1 tablet (10 mg total) by mouth 3 (three) times daily. 30 each 0  . budesonide-formoterol (SYMBICORT) 160-4.5 MCG/ACT inhaler Inhale 2 puffs into the lungs 2 (two) times daily. 1 Inhaler 0  . butalbital-acetaminophen-caffeine (FIORICET) 50-325-40 MG tablet Take 1 tablet by mouth every 6 (six) hours as needed for headache. Patient will pick up scripts today. 60 tablet 3  . gabapentin (NEURONTIN) 300 MG capsule Take 1 capsule (300 mg total) by mouth 3 (three) times daily. 90 capsule 0  . ipratropium-albuterol (DUONEB) 0.5-2.5 (3) MG/3ML SOLN Take 3 mLs by nebulization every 6 (six) hours as needed. (Patient taking differently: Take 3 mLs by nebulization every 6 (six) hours as needed (wheezing/shortness  of breath). ) 360 mL 0  . loratadine (CLARITIN) 10 MG tablet Take 10 mg by mouth daily as needed for allergies.    . nicotine (NICODERM CQ - DOSED IN MG/24 HOURS) 21 mg/24hr patch Place 1 patch (21 mg total) onto the skin daily. Patient will pick up scripts today. 42 patch 0  . nicotine polacrilex (NICORETTE) 4 MG gum Take 4 mg by mouth as needed for smoking cessation.    . ondansetron (ZOFRAN) 8  MG tablet Take 1 tablet (8 mg total) by mouth 2 (two) times daily as needed. Start on the third day after cisplatin chemotherapy. (Patient taking differently: Take 8 mg by mouth 2 (two) times daily as needed for nausea or vomiting. Start on the third day after cisplatin chemotherapy.) 30 tablet 1  . oxyCODONE (OXY IR/ROXICODONE) 5 MG immediate release tablet Take 1 tablet (5 mg total) by mouth every 6 (six) hours as needed for severe pain. 1-2 every 4 hrs as needed for pain 30 tablet 0  . predniSONE (DELTASONE) 20 MG tablet 2 tabs daily for 2 days, then 1 tab daily for 2 days, then 1/2 tab daily  for 2 days (Patient not taking: Reported on 09/11/2018) 7 tablet 0  . PRESCRIPTION MEDICATION Take 1 tablet by mouth as needed (anxiety/sleep).    . prochlorperazine (COMPAZINE) 10 MG tablet Take 1 tablet (10 mg total) by mouth every 6 (six) hours as needed (Nausea or vomiting). 30 tablet 1  . Tiotropium Bromide Monohydrate (SPIRIVA RESPIMAT) 2.5 MCG/ACT AERS Inhale 2 puffs into the lungs daily. (Patient not taking: Reported on 09/11/2018) 4 g 0  . traZODone (DESYREL) 100 MG tablet Take 1 tablet (100 mg total) by mouth at bedtime. Patient will pick up scripts today. (Patient taking differently: Take 100-200 mg by mouth at bedtime. Patient will pick up scripts today.) 90 tablet 2   No facility-administered medications prior to visit.     ROS   Objective:   There were no vitals filed for this visit.   Physical Exam: General: Well-appearing, no acute distress HENT: [place], AT Eyes: EOMI, no scleral icterus Respiratory: Clear to auscultation bilaterally.  No crackles, wheezing or rales Cardiovascular: RRR, -M/R/G, no JVD GI: BS+, soft, nontender Extremities:-Edema,-tenderness Neuro: AAO x4, CNII-XII grossly intact Skin: Intact, no rashes or bruising Psych: Normal mood, normal affect ***NEW***   Data Reviewed:  Imaging: CTA 08/16/2018 - no pulmonary embolism present.  Left hilar and mediastinal lymphadenopathy.  9 mm spiculated nodule in the lingula  PFT: None on file  Labs: CBC    Component Value Date/Time   WBC 5.0 09/11/2018 1001   RBC 3.95 09/11/2018 1001   HGB 9.6 (L) 09/11/2018 1001   HGB 10.5 (L) 09/06/2018 1125   HCT 34.2 (L) 09/11/2018 1001   PLT 378 09/11/2018 1001   PLT 389 09/06/2018 1125   MCV 86.6 09/11/2018 1001   MCH 24.3 (L) 09/11/2018 1001   MCHC 28.1 (L) 09/11/2018 1001   RDW 25.6 (H) 09/11/2018 1001   LYMPHSABS 1.3 09/11/2018 1001   MONOABS 0.1 09/11/2018 1001   EOSABS 0.0 09/11/2018 1001   BASOSABS 0.0 09/11/2018 1001   BMET    Component Value  Date/Time   NA 138 09/11/2018 1001   K 3.4 (L) 09/11/2018 1001   CL 98 09/11/2018 1001   CO2 32 09/11/2018 1001   GLUCOSE 102 (H) 09/11/2018 1001   BUN 16 09/11/2018 1001   CREATININE 0.60 09/11/2018 1001   CREATININE 0.69 09/06/2018 1125   CALCIUM 8.3 (L) 09/11/2018 1001   GFRNONAA >60 09/11/2018 1001   GFRNONAA >60 09/06/2018 1125   GFRAA >60 09/11/2018 1001   GFRAA >60 09/06/2018 1125   Left Upper Lobe Bronchial Brushing 08/24/18 Malignant cells present, consistent with small cell carcinoma  Imaging, labs and test noted above have been reviewed independently by me.    Assessment & Plan:   Discussion: 53 year old female with probable COPD who presents with hilar adenopathy and spiculated lung nodule. Per  Brock calculator, her solitary nodule has at least a 12% risk of malignancy.  Would recommend diagnostic testing.  We discussed multiple modes of tissue sampling including CT-guided biopsy, bronchoscopy and surgery.  Bronchoscopy would potentially be able to offer both diagnostic and staging. We discussed risks and benefits of each procedure including infection, bleeding and pneumothorax.  After discussion, patient wishes to pursue diagnostic bronchoscopy.  During this clinic visit coordinating care with clinical pharmacist for bronchodilator therapy and education for patient's obstructive lung disease.  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 Take this EVERYDAY CONTINUE Spiriva 2 puffs once a day. Take this EVERYDAY  Chronic hypoxemic respiratory failure CONTINUE supplemental oxygen for goal SpO2 >88% Will  perform ambulatory oxygen titration at next visit  Tobacco Abuse QUIT SMOKING  Financial difficulty While she was inpatient, [facility] We will send message to clinic coordinator to evaluate for resources and assistance to apply for Medicaid  No orders of the defined types were placed in this encounter.  No orders of the defined types were placed in this encounter.   No follow-ups on file.  [place], MD [facility] 09/12/2018 4:18 PM  Office Number [phone]

## 2018-09-13 ENCOUNTER — Ambulatory Visit: Payer: Self-pay | Admitting: Pulmonary Disease

## 2018-09-13 ENCOUNTER — Ambulatory Visit: Payer: Self-pay | Attending: Nurse Practitioner | Admitting: Nurse Practitioner

## 2018-09-13 ENCOUNTER — Telehealth: Payer: Self-pay | Admitting: Pulmonary Disease

## 2018-09-13 ENCOUNTER — Ambulatory Visit (INDEPENDENT_AMBULATORY_CARE_PROVIDER_SITE_OTHER): Payer: Self-pay | Admitting: Pulmonary Disease

## 2018-09-13 ENCOUNTER — Other Ambulatory Visit: Payer: Self-pay

## 2018-09-13 ENCOUNTER — Encounter: Payer: Self-pay | Admitting: Nurse Practitioner

## 2018-09-13 ENCOUNTER — Encounter: Payer: Self-pay | Admitting: Pulmonary Disease

## 2018-09-13 DIAGNOSIS — F5104 Psychophysiologic insomnia: Secondary | ICD-10-CM

## 2018-09-13 DIAGNOSIS — J90 Pleural effusion, not elsewhere classified: Secondary | ICD-10-CM

## 2018-09-13 DIAGNOSIS — C3490 Malignant neoplasm of unspecified part of unspecified bronchus or lung: Secondary | ICD-10-CM

## 2018-09-13 DIAGNOSIS — G43009 Migraine without aura, not intractable, without status migrainosus: Secondary | ICD-10-CM

## 2018-09-13 DIAGNOSIS — J42 Unspecified chronic bronchitis: Secondary | ICD-10-CM

## 2018-09-13 DIAGNOSIS — J9611 Chronic respiratory failure with hypoxia: Secondary | ICD-10-CM

## 2018-09-13 MED ORDER — TIOTROPIUM BROMIDE MONOHYDRATE 18 MCG IN CAPS: 18.0000 ug | ORAL_CAPSULE | Freq: Every day | RESPIRATORY_TRACT | 6 refills | Status: DC

## 2018-09-13 MED ORDER — BUDESONIDE-FORMOTEROL FUMARATE 160-4.5 MCG/ACT IN AERO: 2.0000 | INHALATION_SPRAY | Freq: Two times a day (BID) | RESPIRATORY_TRACT | 0 refills | Status: DC

## 2018-09-13 MED ORDER — TOPIRAMATE 50 MG PO TABS: 25.0000 mg | ORAL_TABLET | Freq: Every day | ORAL | 2 refills | Status: DC

## 2018-09-13 MED ORDER — ALBUTEROL SULFATE HFA 108 (90 BASE) MCG/ACT IN AERS: 2.0000 | INHALATION_SPRAY | Freq: Four times a day (QID) | RESPIRATORY_TRACT | 3 refills | Status: DC | PRN

## 2018-09-13 MED ORDER — TRAZODONE HCL 100 MG PO TABS: 100.0000 mg | ORAL_TABLET | Freq: Every day | ORAL | 2 refills | Status: DC

## 2018-09-13 NOTE — Progress Notes (Signed)
Virtual Visit via Telephone Note  I connected with Marissa [name] on 09/13/18 at  2:45 PM EDT by telephone and verified that I am speaking with the correct person using two identifiers.  Location: Patient: Marissa [name] Provider: Rodman Pickle, MD   I discussed the limitations, risks, security and privacy concerns of performing an evaluation and management service by telephone and the availability of in person appointments. I also discussed with the patient that there may be a patient responsible charge related to this service. The patient expressed understanding and agreed to proceed.   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 She was extubated post procedure and chest tube was placed with resolution of pneumothorax. 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 Since her hospital discharge, she returned to the ED on 8/15 for left shoulder pain. CT Chest obtained with interval increase in left pleural effusion with loculation. Oncology and CTS consulted in the ED. Patient discharged with plan for pain management.  Today, she reports ongoing left shoulder/upper back pain that can be debilitating. Feels short of breath with exertion. Reports productive cough with clear, this sputum. Denies fevers, chills, putrid odor. Denies wheezing.  Currently she does still smoking, decreased from 2ppds to 5 cigarettes a day.  She is compliant with her home oxygen   Observations/Objective: Able to speak full sentences without pausing. No audible wheezing.  Left Upper Lobe Bronchial Brushing 08/24/18 Malignant cells present, consistent with small cell carcinoma  Assessment and Plan:  Small cell carcinoma of the lung, limited disease, now with pleural  effusion Discussed utility of thoracentesis in upstaging patient with oncologist. She agreed this would change medical treatment plan. Will arrange for thoracentesis with the following labs:      Pleural- cytology, cell count, LDH, protein, glucose, triglyceride      Serum- LDH and protein  COPD/Asthma Schedule pulmonary function test prior to next visit in 3 months CONTINUE Symbicort 160-4.5 mcg 2 puffs twice a day CHANGE Spiriva Respimat to Spiriva Handihailer 18 mcg 1 inhalation per day  Chronic hypoxemic oxygen CONTINUE supplemental oxygen for goal oxygen saturations greater than 88%   Follow Up Instructions:  We will arrange for a procedure to drain the fluid from your left lung called a THORACENTESIS.  CONTINUE your Symbicort as directed  CHANGE Spiriva Respimat to Spiriva Handihaler  STRONGLY ADVISE AGAINST SMOKING WHEN WEARING OXYGEN.  I discussed the assessment and treatment plan with the patient. The patient was provided an opportunity to ask questions and all were answered. The patient agreed with the plan and demonstrated an understanding of the instructions.   The patient was advised to call back or seek an in-person evaluation if the symptoms worsen or if the condition fails to improve as anticipated.  I provided 25 minutes of non-face-to-face time during this encounter.    Rodman Pickle, MD '

## 2018-09-13 NOTE — Patient Instructions (Signed)
We will arrange for a procedure to drain the fluid from your left lung called a THORACENTESIS.  CONTINUE your Symbicort as directed  CHANGE Spiriva Respimat to Spiriva Handihaler  STRONGLY ADVISE AGAINST SMOKING WHEN WEARING OXYGEN.

## 2018-09-13 NOTE — Telephone Encounter (Signed)
ATC patient, unable to reach, Left message to call back

## 2018-09-13 NOTE — Progress Notes (Signed)
Virtual Visit via Telephone Note Due to national recommendations of social distancing due to [place] 19, telehealth visit is felt to be most appropriate for this patient at this time.  I discussed the limitations, risks, security and privacy concerns of performing an evaluation and management service by telephone and the availability of in person appointments. I also discussed with the patient that there may be a patient responsible charge related to this service. The patient expressed understanding and agreed to proceed.    I connected with Marissa [name] on 09/13/18  at   1:50 PM EDT  EDT by telephone and verified that I am speaking with the correct person using two identifiers.   Consent I discussed the limitations, risks, security and privacy concerns of performing an evaluation and management service by telephone and the availability of in person appointments. I also discussed with the patient that there may be a patient responsible charge related to this service. The patient expressed understanding and agreed to proceed.   Location of Patient: Private  Residence   Location of Provider: [place] [facility] [employer] Office    Persons participating in Telemedicine visit: Geryl Rankins FNP-BC [place]    History of Present Illness: Telemedicine visit for: Establish Care  has a past medical history of Asthma, Chronic headaches, COPD (chronic obstructive pulmonary disease) ([place]), Depression, Heart murmur, and Small cell lung cancer ([place]).   She states she was told by the Oncologist that fioricet was not recommended as it has caffeine in it and not recommended with her lung cancer. Will switch to small cell CA of the lung. Will prescribe 25-50 mg of topamax.      Past Medical History:  Diagnosis Date  . Asthma   . Chronic headaches   . COPD (chronic obstructive pulmonary disease) ([place])   . Depression   . Heart murmur   . Small cell lung cancer [facility])     Past  Surgical History:  Procedure Laterality Date  . BRONCHIAL BIOPSY  08/24/2018   Procedure: BRONCHIAL BIOPSIES;  Surgeon: Margaretha Seeds, MD;  Location: [facility];  Service: Cardiopulmonary;;  . BRONCHIAL BRUSHINGS  08/24/2018   Procedure: BRONCHIAL BRUSHINGS;  Surgeon: Margaretha Seeds, MD;  Location: [facility];  Service: Cardiopulmonary;;  . BRONCHIAL NEEDLE ASPIRATION BIOPSY  08/24/2018   Procedure: BRONCHIAL NEEDLE ASPIRATION BIOPSIES;  Surgeon: Margaretha Seeds, MD;  Location: [facility];  Service: Cardiopulmonary;;  . ENDOBRONCHIAL ULTRASOUND N/A 08/24/2018   Procedure: ENDOBRONCHIAL ULTRASOUND;  Surgeon: Margaretha Seeds, MD;  Location: [facility];  Service: Cardiopulmonary;  Laterality: N/A;  . TUBAL LIGATION    . VIDEO BRONCHOSCOPY  08/24/2018   Procedure: VIDEO BRONCHOSCOPY;  Surgeon: Margaretha Seeds, MD;  Location: [facility];  Service: Cardiopulmonary;;    Family History  Problem Relation Age of Onset  . Heart disease Mother   . Dementia Father     Social History   Socioeconomic History  . Marital status: Single    Spouse name: Not on file  . Number of children: Not on file  . Years of education: Not on file  . Highest education level: Not on file  Occupational History  . Not on file  Social Needs  . Financial resource strain: Not on file  . Food insecurity    Worry: Not on file    Inability: Not on file  . Transportation needs    Medical: Yes    Non-medical: Yes  Tobacco Use  . Smoking status:  Current Every Day Smoker    Packs/day: 2.00    Years: 39.00    Pack years: 78.00    Types: Cigarettes    Start date: 06/22/1979  . Smokeless tobacco: Never Used  Substance and Sexual Activity  . Alcohol use: Yes    Comment: occasional  . Drug use: Not Currently    Types: Marijuana  . Sexual activity: Yes    Birth control/protection: Surgical  Lifestyle  . Physical activity    Days per week: Not on file    Minutes per session: Not on file  .  Stress: Not on file  Relationships  . Social Herbalist on phone: Not on file    Gets together: Not on file    Attends religious service: Not on file    Active member of club or organization: Not on file    Attends meetings of clubs or organizations: Not on file    Relationship status: Not on file  Other Topics Concern  . Not on file  Social History Narrative  . Not on file     Observations/Objective: Awake, alert and oriented x 3   Review of Systems  Constitutional: Negative for fever, malaise/fatigue and weight loss.  HENT: Negative.  Negative for nosebleeds.   Eyes: Negative.  Negative for blurred vision, double vision and photophobia.  Respiratory: Negative.  Negative for cough and shortness of breath.   Cardiovascular: Negative.  Negative for chest pain, palpitations and leg swelling.  Gastrointestinal: Negative.  Negative for heartburn, nausea and vomiting.  Musculoskeletal: Positive for joint pain. Negative for myalgias.  Neurological: Positive for headaches. Negative for dizziness, focal weakness and seizures.  Psychiatric/Behavioral: Negative for suicidal ideas. The patient has insomnia.     Assessment and Plan: [name] was seen today for follow-up.  Diagnoses and all orders for this visit:  Migraine without aura and without status migrainosus, not intractable -     topiramate (TOPAMAX) 50 MG tablet; Take 0.5-1 tablets (25-50 mg total) by mouth daily.  Psychophysiological insomnia Well controlled -     traZODone (DESYREL) 100 MG tablet; Take 1 tablet (100 mg total) by mouth at bedtime. Patient will pick up scripts today.     Follow Up Instructions Return in about 2 months (around 11/13/2018).     I discussed the assessment and treatment plan with the patient. The patient was provided an opportunity to ask questions and all were answered. The patient agreed with the plan and demonstrated an understanding of the instructions.   The patient was advised  to call back or seek an in-person evaluation if the symptoms worsen or if the condition fails to improve as anticipated.  I provided 18 minutes of non-face-to-face time during this encounter including median intraservice time, reviewing previous notes, labs, imaging, medications and explaining diagnosis and management.  Gildardo Pounds, FNP-BC

## 2018-09-13 NOTE — Telephone Encounter (Signed)
Called patient per Dr. Cordelia Pen request to check on since she had cancelled again today for her appointment. Patient states she went Saturday for her shot at the cancer center and should was in so much pain and couldn't breathe she ended back up in the hospital and was told she had fluid around her lung. She is currently waiting a phone call from a doctor to let her know what to do moving forward.   Patient states she has stopped the spiriva due to it not working properly and "some guy at the hospital this weekend told her to stop taking it". She states she is getting low on the symbicort so she would like a refill. I told her this would not be a problem. Patient also requesting to have a rescue inhaler she can carry around with her. I told patient I would let Dr. Loanne Drilling know and I would send in RX to her pharmacy.   Spoke with Dr. Loanne Drilling she said Rx for albuterol is fine. She would like to see if patient would like to do a televisit today.   Called patient back and spoke with her, patient scheduled for televisit today at 1445.  Nothing further needed

## 2018-09-14 ENCOUNTER — Encounter (HOSPITAL_COMMUNITY): Payer: Self-pay | Admitting: Student

## 2018-09-14 ENCOUNTER — Other Ambulatory Visit: Payer: Self-pay

## 2018-09-14 ENCOUNTER — Ambulatory Visit (HOSPITAL_COMMUNITY)
Admission: RE | Admit: 2018-09-14 | Discharge: 2018-09-14 | Disposition: A | Discharge status: Home / Self Care | Payer: Medicaid Other | Source: Ambulatory Visit | Attending: Student | Admitting: Student

## 2018-09-14 ENCOUNTER — Telehealth: Payer: Self-pay | Admitting: *Deleted

## 2018-09-14 ENCOUNTER — Telehealth: Payer: Self-pay

## 2018-09-14 ENCOUNTER — Ambulatory Visit (HOSPITAL_COMMUNITY)
Admission: RE | Admit: 2018-09-14 | Discharge: 2018-09-14 | Disposition: A | Discharge status: Home / Self Care | Payer: Medicaid Other | Source: Ambulatory Visit | Attending: Pulmonary Disease | Admitting: Pulmonary Disease

## 2018-09-14 ENCOUNTER — Other Ambulatory Visit (HOSPITAL_COMMUNITY): Payer: Self-pay | Admitting: Student

## 2018-09-14 ENCOUNTER — Other Ambulatory Visit: Payer: Self-pay | Admitting: Hematology

## 2018-09-14 DIAGNOSIS — C3490 Malignant neoplasm of unspecified part of unspecified bronchus or lung: Secondary | ICD-10-CM

## 2018-09-14 DIAGNOSIS — R222 Localized swelling, mass and lump, trunk: Secondary | ICD-10-CM | POA: Diagnosis not present

## 2018-09-14 DIAGNOSIS — J90 Pleural effusion, not elsewhere classified: Secondary | ICD-10-CM | POA: Diagnosis not present

## 2018-09-14 HISTORY — PX: IR THORACENTESIS ASP PLEURAL SPACE W/IMG GUIDE: IMG5380

## 2018-09-14 LAB — BODY FLUID CELL COUNT WITH DIFFERENTIAL
Eos, Fluid: 0 %
Lymphs, Fluid: 27 %
Monocyte-Macrophage-Serous Fluid: 8 % — ABNORMAL LOW (ref 50–90)
Neutrophil Count, Fluid: 65 % — ABNORMAL HIGH (ref 0–25)
Total Nucleated Cell Count, Fluid: 850 cu mm (ref 0–1000)

## 2018-09-14 LAB — PROTEIN, PLEURAL OR PERITONEAL FLUID: Total protein, fluid: 4.8 g/dL

## 2018-09-14 LAB — LACTATE DEHYDROGENASE, PLEURAL OR PERITONEAL FLUID: LD, Fluid: 414 U/L — ABNORMAL HIGH (ref 3–23)

## 2018-09-14 LAB — GLUCOSE, PLEURAL OR PERITONEAL FLUID: Glucose, Fluid: 122 mg/dL

## 2018-09-14 MED ORDER — OXYCODONE HCL 5 MG PO TABS: 5.0000 mg | ORAL_TABLET | ORAL | 0 refills | Status: DC | PRN

## 2018-09-14 MED ORDER — LIDOCAINE HCL 1 % IJ SOLN
INTRAMUSCULAR | Status: DC | PRN
  Administered 2018-09-14: 10 mL

## 2018-09-14 MED ORDER — LIDOCAINE HCL 1 % IJ SOLN
INTRAMUSCULAR | Status: AC
  Filled 2018-09-14: qty 20

## 2018-09-14 MED FILL — oxyCODONE HCL 5 MG TABS: 5 | 5 days supply | Qty: 60 | Fill #0

## 2018-09-14 MED FILL — traZODone HCL 100 MG TABS: 100 | 30 days supply | Qty: 30 | Fill #0

## 2018-09-14 MED FILL — TOPIRAMATE 50 MG TABLET: 50 | 30 days supply | Qty: 30 | Fill #0

## 2018-09-14 NOTE — Telephone Encounter (Signed)
Notified of message below. Verbalized understanding 

## 2018-09-14 NOTE — Progress Notes (Addendum)
[place]   [phone] [phone]   Clinic Follow up Note   Patient Care Team: Gildardo Pounds, NP as PCP - General (Nurse Practitioner) 09/15/2018  CHIEF COMPLAINT: Follow-up small cell lung cancer  SUMMARY OF ONCOLOGIC HISTORY: Oncology History  Malignant neoplasm of bronchus of left upper lobe ([place])  07/29/2018 Imaging   CT Angio Chest 07/29/18  IMPRESSION: 1. Malignant appearing left mediastinal and hilar lymphadenopathy narrowing the airways at the left hilum. In the left lung only a subtle 9 mm spiculated nodule is identified in the lingula. Consider small cell carcinoma. Bronchoscopy or mediastinal node sampling might be most valuable for diagnosis. 2. Indeterminate although low-density (which typically which indicates a benign adenoma) left adrenal nodule. 3.  Negative for acute pulmonary embolus.   08/24/2018 Definitive Surgery   Bronchoscopy and EBUS biopsy on 08/24/18 by Dr. Loanne Drilling   08/24/2018 Initial Biopsy   Diagnosis 08/24/18 FINE NEEDLE ASPIRATION, ENDOSCOPIC, (EBUS) STATION # 7 LYMPH NODE(SPECIMEN 1 OF 4 COLLECTED 08/24/18): NO MALIGNANT CELLS IDENTIFIED.  FINE NEEDLE ASPIRATION, ENDOSCOPIC, (EBUS) LEFT HILAR LYMPH NODE(SPECIMEN 2 OF 4 COLLECTED 08/24/18): NO MALIGNANT CELLS IDENTIFIED.  TRANSBRONCHIAL NEEDLE ASPIRATION, WANG, (SPECIMEN 3 OF 4 COLLECTED 08/24/18): NO MALIGNANT CELLS IDENTIFIED.  BRONCHIAL BRUSHING, LUL (SPECIMEN 4 OF 4 COLLECTED 08/24/18): MALIGNANT CELLS PRESENT, MOST CONSISTENT WITH SMALL CELL CARCINOMA. SEE COMMENT.   08/26/2018 Initial Diagnosis   Small cell lung cancer, left ([place])   08/26/2018 Imaging   CT AP W Contrast 08/26/18  IMPRESSION: 1. 2.9 cm left adrenal nodule cannot be definitively characterized on this study. Potentially a lipid poor adenoma, but metastatic disease not excluded. MRI of the abdomen with and without contrast may prove helpful to further evaluate. 2. No other findings to  suggest metastatic disease in the abdomen/pelvis. 3. Left base atelectasis with small left pleural effusion.   08/27/2018 Imaging   MRI brain 08/27/18  IMPRESSION: 1. Motion degraded, incomplete examination. 2. Subcentimeter focus of diffusion signal abnormality in the left cerebellum. No enhancement is evident to strongly suggest a metastasis, and this may reflect an acute to subacute small vessel infarct. 3. No evidence of intracranial metastatic disease elsewhere. 4. Given motion artifact and incomplete postcontrast imaging, consider short-term follow-up MRI (possibly with sedation) to further evaluate the left cerebellar abnormality and provide a more thorough evaluation for metastatic disease.   09/02/2018 PET scan   IMPRESSION: 1. Hypermetabolic left hilar mass is identified encasing the left mainstem bronchus resulting in postobstructive atelectasis in the left upper lobe. 2. Hypermetabolic left mediastinal nodal metastasis. 3. Loculated left pleural effusion is identified with mild increased FDG uptake. Cannot rule out malignant pleural effusion. 4. No evidence for metastatic disease to the abdomen or pelvis or skeletal structures.     09/07/2018 -  Chemotherapy   Concurrent chemoRT with IV cisplatin on day 1 and etopside day 1-3 for every 3 weeks starting 8/11. Will start radiation with Dr. Lisbeth Renshaw with cycle 2.     Radiation Therapy   Concurrent chemoRT with Dr. Lisbeth Renshaw. Start with cycle 2 chemo.    09/14/2018 Procedure   Thoracentesis yielding 750 mL     CURRENT THERAPY: Concurrent chemoRT with IV cisplatin on day 1 and etopside day 1-3 for every 3 weeks starting 8/11. Will start radiation with cycle 2.   INTERVAL HISTORY: Marissa [name] returns for follow-up as scheduled.  She completed cycle 1 cisplatin and etoposide from 8/11-8/13, she received Udenyca on 8/15. She underwent thoracentesis on 09/14/18.  Her  dyspnea improved after this procedure, she remains on 3 L supplemental  O2 which is her baseline.  Cough is stable.  She reports severe left upper chest/shoulder pain rating "13-15 out of 10." She reports this is the same pain since her diagnosis but worsened after chemo. She was told to increase oxycodone to 1-2 tabs every 4 hours, she reports taking 4-5 tabs at a time every 2-3 hours without pain relief.  Pain is shooting and stabbing.  She takes gabapentin 300 mg in the afternoon at bedtime, morning dose makes her very drowsy. Pain did not seem to get worse after Udenyca. After chemo she developed mild intermittent nausea without vomiting, Compazine helps.  She has not started Zofran yet.  Denies constipation or diarrhea.  Taste is decreased but remains able to eat and drink.  Denies mucositis. Had a chill yesterday during an episode of nausea while sitting in front of a fan with cold rag. Denies fever. She has mild tinnitus in the left ear, denies ear pain, headache, hearing loss.  Denies neuropathy.   MEDICAL HISTORY:  Past Medical History:  Diagnosis Date   Asthma    Chronic headaches    COPD (chronic obstructive pulmonary disease) ([place])    Depression    Heart murmur    Small cell lung cancer ([place])     SURGICAL HISTORY: Past Surgical History:  Procedure Laterality Date   BRONCHIAL BIOPSY  08/24/2018   Procedure: BRONCHIAL BIOPSIES;  Surgeon: Margaretha Seeds, MD;  Location: [facility];  Service: Cardiopulmonary;;   BRONCHIAL BRUSHINGS  08/24/2018   Procedure: BRONCHIAL BRUSHINGS;  Surgeon: Margaretha Seeds, MD;  Location: [facility];  Service: Cardiopulmonary;;   BRONCHIAL NEEDLE ASPIRATION BIOPSY  08/24/2018   Procedure: BRONCHIAL NEEDLE ASPIRATION BIOPSIES;  Surgeon: Margaretha Seeds, MD;  Location: [facility];  Service: Cardiopulmonary;;   ENDOBRONCHIAL ULTRASOUND N/A 08/24/2018   Procedure: ENDOBRONCHIAL ULTRASOUND;  Surgeon: Margaretha Seeds, MD;  Location: [facility];  Service: Cardiopulmonary;  Laterality: N/A;   IR THORACENTESIS  ASP PLEURAL SPACE W/IMG GUIDE  09/14/2018   TUBAL LIGATION     VIDEO BRONCHOSCOPY  08/24/2018   Procedure: VIDEO BRONCHOSCOPY;  Surgeon: Margaretha Seeds, MD;  Location: [facility];  Service: Cardiopulmonary;;    I have reviewed the social history and family history with the patient and they are unchanged from previous note.  ALLERGIES:  is allergic to penicillins.  MEDICATIONS:  Current Outpatient Medications  Medication Sig Dispense Refill   albuterol (VENTOLIN HFA) 108 (90 Base) MCG/ACT inhaler Inhale 2 puffs into the lungs every 6 (six) hours as needed for wheezing or shortness of breath. 6.7 g 3   baclofen (LIORESAL) 10 MG tablet Take 1 tablet (10 mg total) by mouth 3 (three) times daily. 30 each 0   budesonide-formoterol (SYMBICORT) 160-4.5 MCG/ACT inhaler Inhale 2 puffs into the lungs 2 (two) times daily. 1 Inhaler 0   gabapentin (NEURONTIN) 300 MG capsule Take 1 capsule (300 mg total) by mouth 3 (three) times daily. 90 capsule 0   ipratropium-albuterol (DUONEB) 0.5-2.5 (3) MG/3ML SOLN Take 3 mLs by nebulization every 6 (six) hours as needed. (Patient taking differently: Take 3 mLs by nebulization every 6 (six) hours as needed (wheezing/shortness  of breath). ) 360 mL 0   loratadine (CLARITIN) 10 MG tablet Take 10 mg by mouth daily as needed for allergies.     nicotine (NICODERM CQ - DOSED IN MG/24 HOURS) 21 mg/24hr patch Place 1 patch (21 mg total) onto the  skin daily. Patient will pick up scripts today. 42 patch 0   nicotine polacrilex (NICORETTE) 4 MG gum Take 4 mg by mouth as needed for smoking cessation.     ondansetron (ZOFRAN) 8 MG tablet Take 1 tablet (8 mg total) by mouth 2 (two) times daily as needed. Start on the third day after cisplatin chemotherapy. (Patient taking differently: Take 8 mg by mouth 2 (two) times daily as needed for nausea or vomiting. Start on the third day after cisplatin chemotherapy.) 30 tablet 1   oxyCODONE (OXY IR/ROXICODONE) 5 MG immediate  release tablet Take 1-2 tablets (5-10 mg total) by mouth every 4 (four) hours as needed for severe pain. 1-2 every 4 hrs as needed for pain 60 tablet 0   PRESCRIPTION MEDICATION Take 1 tablet by mouth as needed (anxiety/sleep).     prochlorperazine (COMPAZINE) 10 MG tablet Take 1 tablet (10 mg total) by mouth every 6 (six) hours as needed (Nausea or vomiting). 30 tablet 1   topiramate (TOPAMAX) 50 MG tablet Take 0.5-1 tablets (25-50 mg total) by mouth daily. 60 tablet 2   traZODone (DESYREL) 100 MG tablet Take 1 tablet (100 mg total) by mouth at bedtime. Patient will pick up scripts today. 90 tablet 2   methylPREDNISolone (MEDROL DOSEPAK) 4 MG TBPK tablet Take as prescribed 21 tablet 0   morphine (Marissa CONTIN) 15 MG 12 hr tablet Take 1 tablet (15 mg total) by mouth every 12 (twelve) hours. 60 tablet 0   tiotropium (SPIRIVA) 18 MCG inhalation capsule Place 1 capsule (18 mcg total) into inhaler and inhale daily. (Patient not taking: Reported on 09/15/2018) 30 capsule 6   No current facility-administered medications for this visit.     PHYSICAL EXAMINATION: ECOG PERFORMANCE STATUS: 1-2  Vitals:   09/15/18 0855  BP: (!) 113/59  Pulse: 100  Resp: 18  Temp: 99.2 F (37.3 C)  SpO2: 100%   Filed Weights   09/15/18 0855  Weight: 172 lb 14.4 oz (78.4 kg)    GENERAL:alert, no distress and comfortable SKIN: no rash  EYES:  sclera clear OROPHARYNX: missing teeth LUNGS: clear to auscultation with normal breathing effort, 3 L o2, no wheezes  HEART: regular rate, rhythm; no lower extremity edema Musculoskeletal:no cyanosis of digits. Tenderness and limited ROM to LUE  NEURO: alert & oriented x 3 with fluent speech, normal gait Limited exam for covid19 outbreak   LABORATORY DATA:  I have reviewed the data as listed CBC Latest Ref Rng & Units 09/15/2018 09/11/2018 09/06/2018  WBC 4.0 - 10.5 K/uL 7.3 5.0 11.0(H)  Hemoglobin 12.0 - 15.0 g/dL 10.4(L) 9.6(L) 10.5(L)  Hematocrit 36.0 - 46.0 %  36.3 34.2(L) 37.2  Platelets 150 - 400 K/uL 265 378 389     CMP Latest Ref Rng & Units 09/15/2018 09/11/2018 09/06/2018  Glucose 70 - 99 mg/dL 126(H) 102(H) 113(H)  BUN 6 - 20 mg/dL 12 16 7   Creatinine 0.44 - 1.00 mg/dL 0.73 0.60 0.69  Sodium 135 - 145 mmol/L 141 138 141  Potassium 3.5 - 5.1 mmol/L 3.6 3.4(L) 3.8  Chloride 98 - 111 mmol/L 103 98 105  CO2 22 - 32 mmol/L 29 32 25  Calcium 8.9 - 10.3 mg/dL 9.4 8.3(L) 9.6  Total Protein 6.5 - 8.1 g/dL 7.7 - 7.3  Total Bilirubin 0.3 - 1.2 mg/dL 0.2(L) - 0.2(L)  Alkaline Phos 38 - 126 U/L 178(H) - 161(H)  AST 15 - 41 U/L 10(L) - 29  ALT 0 - 44 U/L 16 -  60(H)      RADIOGRAPHIC STUDIES: I have personally reviewed the radiological images as listed and agreed with the findings in the report. Dg Chest 1 View  Result Date: 09/14/2018 CLINICAL DATA:  Status post thoracentesis EXAM: CHEST  1 VIEW COMPARISON:  September 11, 2018 chest radiograph and chest CT FINDINGS: No pneumothorax. There is somewhat less pleural effusion on the left. There remains small partially loculated pleural effusion on the left with left base atelectasis. There is again noted complete left upper lobe collapse. Right lung is clear. Heart is upper normal in size with pulmonary vascularity normal. Mass arising from the right hilar region is apparent but better seen on recent CT. No adenopathy seen to the right of midline by radiography. IMPRESSION: Pleural effusions somewhat smaller. No pneumothorax. Small left pleural effusion with left lower lobe atelectasis present. Left perihilar region mass with collapse of the left upper lobe remains. Right lung clear. Stable cardiac silhouette. Electronically Signed   By: Lowella Grip III M.D.   On: 09/14/2018 09:45   Ir Thoracentesis Asp Pleural Space W/img Guide  Result Date: 09/14/2018 INDICATION: Patient with history of small cell carcinoma of left lung, dyspnea, and left pleural effusion. Request is made for diagnostic and  therapeutic left thoracentesis. EXAM: ULTRASOUND GUIDED DIAGNOSTIC AND THERAPEUTIC LEFT THORACENTESIS MEDICATIONS: 15 mL 1% lidocaine COMPLICATIONS: None immediate. PROCEDURE: An ultrasound guided thoracentesis was thoroughly discussed with the patient and questions answered. The benefits, risks, alternatives and complications were also discussed. The patient understands and wishes to proceed with the procedure. Written consent was obtained. Ultrasound was performed to localize and mark an adequate pocket of fluid in the left chest. The area was then prepped and draped in the normal sterile fashion. 1% Lidocaine was used for local anesthesia. Under ultrasound guidance a 6 Fr Safe-T-Centesis catheter was introduced. Thoracentesis was performed. The catheter was removed and a dressing applied. FINDINGS: A total of approximately 750 mL of hazy amber fluid was removed. Samples were sent to the laboratory as requested by the clinical team. IMPRESSION: Successful ultrasound guided left thoracentesis yielding 750 mL of pleural fluid. Read by: Earley Abide, PA-C Electronically Signed   By: Corrie Mckusick D.O.   On: 09/14/2018 10:59     ASSESSMENT & PLAN:  Marissa [name] is a 53 y.o. female with   1.Small cell lung cancer, stage III, limited stage  -Recently diagnosed in 07/2018. Imaging showed 46mm nodule of left lung with malignant appearing left mediastinal and hilar lymphadenopathy. Her Bronchoscopy biopsy confirmed mass to be small cell lung cancer while her left hilar andmediastinal lymph node biopsy were negative for malignant cells.  -CT AP and MRI brain negative for distant metastasis.  - PET scan 09/02/18 shows Hypermetabolic left mediastinal nodal metastasis, but no distant metastasis.  -Given her locally advanced disease (stage III cancer) Dr. Burr Medico initiated IV chemo cisplatin on day 1 and etopside day 1-3 for every 3 weeks; s/p cycle 1 on 8/11-8/13 and udenyca on 8/15. She tolerated cycle 1 well with  intermittent mild fatigue. -Plan to start radiation with Dr. Lisbeth Renshaw with cycle 2 chemo -s/p thoracentesis, cytology is pending  2. Smoking Cessation, COPD, recent acute left tension pneumothorax -s/p bronchoscopy and EBUS biopsy on 7/28 and wasadmitted after procedure for pneumothorax. Now resolved.  -She is currently on continuous nasal canula oxygen  3. Severe LUQ and left flank pain  -She had chest tube placed on left upper back S/p removal she has had severe radiating LUQ pain.  -  started on oxycodone 5 mg q6, over last few days patient developed left shoulder pain and increased her pain medication, taking 20-25 mg q2-3 hours  -started on gabapentin for neuropathic component    4. Iron deficient anemia s/p IV Feraheme 7/3, 7/31 -hgb 10.4 today   5. Financial Support  -Her medicaid is currently pending, followed by financial support staff   Dispo:  Marissa [name] appears stable. She completed cycle 1 systemic chemotherapy with cisplatin on day 1 and etoposide on days 1-3 with Udenyca. She tolerated well with mild intermittent nausea mostly controlled with compazine. We reviewed symptom management. She can begin using zofran after day 3 chemo and alternate with compazine PRN. We reviewed diet and hydration. She underwent thoracentesis on 09/14/18, cytology is pending. Labs reviewed today, no significant cytopenias, CMP stable.  She developed severe left shoulder/upper chest pain, exam shows tenderness and limited ROM. I recommend to increase gabapentin to 300 mg PM and 600 mg HS, begin long acting Marissa contin 15 mg q12, and continue oxyIR 5 mg 1-2 tabs q6 hours PRN for breakthrough. We discussed addictive nature of these pain medications. Goal is to wean as soon as possible once pain control is achieved. She denies previous abuse/addiction. I reviewed PDMP. I urged her to add stool softener or laxative PRN if she develops opioid-induced constipation. She agrees to referral to pain specialist to  discuss alternative pain control options, I placed urgent referral. Marissa [name] was seen with Dr. Burr Medico who recommends short course of steroids for shoulder pain if this has an inflammatory component.  She will return for follow up and cycle 2 chemo in 2 weeks.    Orders Placed This Encounter  Procedures   Ambulatory referral to Pain Clinic    Referral Priority:   Urgent    Referral Type:   Consultation    Referral Reason:   Specialty Services Required    Requested Specialty:   Pain Medicine    Number of Visits Requested:   1   All questions were answered. The patient knows to call the clinic with any problems, questions or concerns. No barriers to learning was detected.     Alla Feeling, NP 09/15/18   Addendum  Marissa [name] returns for follow up. She tolerated chemo well overall last week. Her main concern is left chest pain. The pian at lateral left low chest has improved, however she has developed severe pain at left upper chest around shoulder, with some limited ROM of left shoulder, and tenderness in that area. No neck or left arm edema. I will let her try a short course of steroid to see if it help with her pain. Her pain is poorly controlled, will add Marissa contin 15mg  q12h, and refer her to pain specilalist Dr. Maryjean Ka (I spoke with him yesterday) for left intercostal nerve block and her pain management. She agrees with the plan. She underwent left thoracentesis, cytology is negative for malignant cells.   I spoke with her pulmonologist Dr. Loanne Drilling 2 days ago to coordinate her care. Both of [place] feel thoracentesis to rule out malignant pleural effusion and infection was necessary.   Will see her back before cycle 2 chemo, she is going to start concurrent radiation with cycle 2 chemo.  [name]  09/15/2018

## 2018-09-14 NOTE — Procedures (Signed)
PROCEDURE SUMMARY:  Successful image-guided left thoracentesis. Yielded 750 milliliters of hazy amber fluid. Patient tolerated procedure well. No immediate complications. EBL < 5 mL.  Specimen was sent for labs. CXR ordered.  Geddis Areliz Rothman PA-C 09/14/2018 9:39 AM

## 2018-09-14 NOTE — Telephone Encounter (Signed)
Pt left a message stating that she needs something more than the Oxycodone 5 mg. States it doesn't last more than 2 hours.

## 2018-09-14 NOTE — Telephone Encounter (Signed)
Attempted to call pt but unable to reach.left message for pt to return call. 

## 2018-09-14 NOTE — Telephone Encounter (Signed)
She can take oxycodone 5-10mg  every 4 hours as needed, will adjust her pain meds tomorrow when we see her, thanks    [name] MD

## 2018-09-14 NOTE — Telephone Encounter (Signed)
Left vm for pt regarding prescreening questions for appointment on 8/19.

## 2018-09-15 ENCOUNTER — Telehealth: Payer: Self-pay | Admitting: Nurse Practitioner

## 2018-09-15 ENCOUNTER — Inpatient Hospital Stay: Payer: Medicaid Other

## 2018-09-15 ENCOUNTER — Inpatient Hospital Stay (HOSPITAL_BASED_OUTPATIENT_CLINIC_OR_DEPARTMENT_OTHER): Payer: Medicaid Other | Admitting: Nurse Practitioner

## 2018-09-15 ENCOUNTER — Encounter: Payer: Self-pay | Admitting: Nurse Practitioner

## 2018-09-15 ENCOUNTER — Other Ambulatory Visit: Payer: Self-pay

## 2018-09-15 VITALS — BP 113/59 | HR 100 | Temp 99.2°F | Resp 18 | Ht 62.0 in | Wt 172.9 lb

## 2018-09-15 DIAGNOSIS — M25512 Pain in left shoulder: Secondary | ICD-10-CM

## 2018-09-15 DIAGNOSIS — C3490 Malignant neoplasm of unspecified part of unspecified bronchus or lung: Secondary | ICD-10-CM

## 2018-09-15 DIAGNOSIS — C3492 Malignant neoplasm of unspecified part of left bronchus or lung: Secondary | ICD-10-CM

## 2018-09-15 DIAGNOSIS — R0789 Other chest pain: Secondary | ICD-10-CM

## 2018-09-15 DIAGNOSIS — Z5111 Encounter for antineoplastic chemotherapy: Secondary | ICD-10-CM | POA: Diagnosis not present

## 2018-09-15 LAB — TRIGLYCERIDES, BODY FLUIDS: Triglycerides, Fluid: 62 mg/dL

## 2018-09-15 LAB — CMP (CANCER CENTER ONLY)
ALT: 16 U/L (ref 0–44)
AST: 10 U/L — ABNORMAL LOW (ref 15–41)
Albumin: 3.5 g/dL (ref 3.5–5.0)
Alkaline Phosphatase: 178 U/L — ABNORMAL HIGH (ref 38–126)
Anion gap: 9 (ref 5–15)
BUN: 12 mg/dL (ref 6–20)
CO2: 29 mmol/L (ref 22–32)
Calcium: 9.4 mg/dL (ref 8.9–10.3)
Chloride: 103 mmol/L (ref 98–111)
Creatinine: 0.73 mg/dL (ref 0.44–1.00)
GFR, Est AFR Am: >60 mL/min (ref >60)
GFR, Estimated: >60 mL/min (ref >60)
Glucose, Bld: 126 mg/dL — ABNORMAL HIGH (ref 70–99)
Potassium: 3.6 mmol/L (ref 3.5–5.1)
Sodium: 141 mmol/L (ref 135–145)
Total Bilirubin: 0.2 mg/dL — ABNORMAL LOW (ref 0.3–1.2)
Total Protein: 7.7 g/dL (ref 6.5–8.1)

## 2018-09-15 LAB — CBC WITH DIFFERENTIAL (CANCER CENTER ONLY)
Abs Immature Granulocytes: 0.04 10*3/uL (ref 0.00–0.07)
Basophils Absolute: 0.1 10*3/uL (ref 0.0–0.1)
Basophils Relative: 1 %
Eosinophils Absolute: 0.1 10*3/uL (ref 0.0–0.5)
Eosinophils Relative: 2 %
HCT: 36.3 % (ref 36.0–46.0)
Hemoglobin: 10.4 g/dL — ABNORMAL LOW (ref 12.0–15.0)
Immature Granulocytes: 1 %
Lymphocytes Relative: 21 %
Lymphs Abs: 1.5 10*3/uL (ref 0.7–4.0)
MCH: 24.6 pg — ABNORMAL LOW (ref 26.0–34.0)
MCHC: 28.7 g/dL — ABNORMAL LOW (ref 30.0–36.0)
MCV: 86 fL (ref 80.0–100.0)
Monocytes Absolute: 0.3 10*3/uL (ref 0.1–1.0)
Monocytes Relative: 4 %
Neutro Abs: 5.2 10*3/uL (ref 1.7–7.7)
Neutrophils Relative %: 71 %
Platelet Count: 265 10*3/uL (ref 150–400)
RBC: 4.22 MIL/uL (ref 3.87–5.11)
RDW: 25 % — ABNORMAL HIGH (ref 11.5–15.5)
WBC Count: 7.3 10*3/uL (ref 4.0–10.5)
nRBC: 0 % (ref 0.0–0.2)

## 2018-09-15 MED ORDER — METHYLPREDNISOLONE 4 MG PO TBPK: ORAL_TABLET | ORAL | 0 refills | Status: DC

## 2018-09-15 MED ORDER — MORPHINE SULFATE ER 15 MG PO TBCR: 15.0000 mg | EXTENDED_RELEASE_TABLET | Freq: Two times a day (BID) | ORAL | 0 refills | Status: DC

## 2018-09-15 MED FILL — MORPHINE SULF ER 15 MG TAB: 15 | 30 days supply | Qty: 60 | Fill #0

## 2018-09-15 MED FILL — METHYLPREDNISOLONE 4 MG TBP: 4 | 6 days supply | Qty: 21 | Fill #0

## 2018-09-15 NOTE — Telephone Encounter (Signed)
Scheduled appt per 8/19 los.  Spoke with patient and she is aware of the appt date and time.

## 2018-09-15 NOTE — Telephone Encounter (Signed)
ATC pt, there was no answer and I could not leave a message. We have attempted to contact pt several times with no success or call back from pt. Per triage protocol, message will be closed.

## 2018-09-16 ENCOUNTER — Telehealth: Payer: Self-pay

## 2018-09-16 NOTE — Telephone Encounter (Signed)
-----   Message from [name], MD sent at 09/15/2018 10:01 PM EDT ----- Marissa [name],  Please let pt know that her cytology from left thoracentesis was negative for cancer cells.   Dr. Loanne Drilling, I spoke with Dr. Maryjean Ka and I referred her to him for intercostal nerve block.   Thanks  Krista Blue

## 2018-09-16 NOTE — Telephone Encounter (Signed)
Left voice message on patient identified voice mail box, per Dr. Burr Medico informed her cytology from left thoracentesis was negative for cancer cells.  Also trying to find out if Dr. Maryjean Ka (pain management) can do an intercostal nerve block where she had the chest tube.

## 2018-09-21 ENCOUNTER — Other Ambulatory Visit: Payer: Self-pay

## 2018-09-21 ENCOUNTER — Ambulatory Visit
Admission: RE | Admit: 2018-09-21 | Discharge: 2018-09-21 | Disposition: A | Discharge status: Home / Self Care | Payer: Medicaid Other | Source: Ambulatory Visit | Attending: Radiation Oncology | Admitting: Radiation Oncology

## 2018-09-21 DIAGNOSIS — C3412 Malignant neoplasm of upper lobe, left bronchus or lung: Secondary | ICD-10-CM | POA: Insufficient documentation

## 2018-09-21 DIAGNOSIS — Z510 Encounter for antineoplastic radiation therapy: Secondary | ICD-10-CM | POA: Insufficient documentation

## 2018-09-24 DIAGNOSIS — Z510 Encounter for antineoplastic radiation therapy: Secondary | ICD-10-CM | POA: Diagnosis not present

## 2018-09-27 NOTE — Addendum Note (Signed)
Addended by: Tora Kindred on: 09/27/2018 03:30 PM   Modules accepted: Orders
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
## 2018-09-28 ENCOUNTER — Inpatient Hospital Stay: Payer: Medicaid Other

## 2018-09-28 ENCOUNTER — Inpatient Hospital Stay (HOSPITAL_BASED_OUTPATIENT_CLINIC_OR_DEPARTMENT_OTHER): Payer: Medicaid Other | Admitting: Nurse Practitioner

## 2018-09-28 ENCOUNTER — Encounter: Payer: Self-pay | Admitting: Nurse Practitioner

## 2018-09-28 ENCOUNTER — Other Ambulatory Visit: Payer: Self-pay

## 2018-09-28 ENCOUNTER — Ambulatory Visit (HOSPITAL_COMMUNITY)
Admission: RE | Admit: 2018-09-28 | Discharge: 2018-09-28 | Disposition: A | Discharge status: Home / Self Care | Payer: Medicaid Other | Source: Ambulatory Visit | Attending: Nurse Practitioner | Admitting: Nurse Practitioner

## 2018-09-28 ENCOUNTER — Inpatient Hospital Stay: Payer: Medicaid Other | Attending: Hematology

## 2018-09-28 ENCOUNTER — Ambulatory Visit
Admission: RE | Admit: 2018-09-28 | Discharge: 2018-09-28 | Disposition: A | Discharge status: Home / Self Care | Payer: Medicaid Other | Source: Ambulatory Visit | Attending: Radiation Oncology | Admitting: Radiation Oncology

## 2018-09-28 ENCOUNTER — Telehealth: Payer: Self-pay | Admitting: Nurse Practitioner

## 2018-09-28 VITALS — BP 134/75 | HR 95 | Temp 98.3°F | Resp 18 | Ht 62.0 in | Wt 174.0 lb

## 2018-09-28 DIAGNOSIS — R109 Unspecified abdominal pain: Secondary | ICD-10-CM | POA: Diagnosis not present

## 2018-09-28 DIAGNOSIS — M25561 Pain in right knee: Secondary | ICD-10-CM

## 2018-09-28 DIAGNOSIS — C771 Secondary and unspecified malignant neoplasm of intrathoracic lymph nodes: Secondary | ICD-10-CM | POA: Diagnosis not present

## 2018-09-28 DIAGNOSIS — R112 Nausea with vomiting, unspecified: Secondary | ICD-10-CM | POA: Insufficient documentation

## 2018-09-28 DIAGNOSIS — E876 Hypokalemia: Secondary | ICD-10-CM | POA: Diagnosis not present

## 2018-09-28 DIAGNOSIS — C3492 Malignant neoplasm of unspecified part of left bronchus or lung: Secondary | ICD-10-CM

## 2018-09-28 DIAGNOSIS — R110 Nausea: Secondary | ICD-10-CM | POA: Insufficient documentation

## 2018-09-28 DIAGNOSIS — G8929 Other chronic pain: Secondary | ICD-10-CM | POA: Insufficient documentation

## 2018-09-28 DIAGNOSIS — C3412 Malignant neoplasm of upper lobe, left bronchus or lung: Secondary | ICD-10-CM

## 2018-09-28 DIAGNOSIS — Z5111 Encounter for antineoplastic chemotherapy: Secondary | ICD-10-CM | POA: Diagnosis present

## 2018-09-28 DIAGNOSIS — Z79899 Other long term (current) drug therapy: Secondary | ICD-10-CM | POA: Diagnosis not present

## 2018-09-28 DIAGNOSIS — Z510 Encounter for antineoplastic radiation therapy: Secondary | ICD-10-CM | POA: Insufficient documentation

## 2018-09-28 DIAGNOSIS — C3490 Malignant neoplasm of unspecified part of unspecified bronchus or lung: Secondary | ICD-10-CM

## 2018-09-28 DIAGNOSIS — M25512 Pain in left shoulder: Secondary | ICD-10-CM

## 2018-09-28 DIAGNOSIS — D509 Iron deficiency anemia, unspecified: Secondary | ICD-10-CM | POA: Diagnosis not present

## 2018-09-28 LAB — CBC WITH DIFFERENTIAL (CANCER CENTER ONLY)
Abs Immature Granulocytes: 0.15 10*3/uL — ABNORMAL HIGH (ref 0.00–0.07)
Basophils Absolute: 0.1 10*3/uL (ref 0.0–0.1)
Basophils Relative: 1 %
Eosinophils Absolute: 0.1 10*3/uL (ref 0.0–0.5)
Eosinophils Relative: 1 %
HCT: 40.7 % (ref 36.0–46.0)
Hemoglobin: 11.8 g/dL — ABNORMAL LOW (ref 12.0–15.0)
Immature Granulocytes: 1 %
Lymphocytes Relative: 13 %
Lymphs Abs: 1.4 10*3/uL (ref 0.7–4.0)
MCH: 25.5 pg — ABNORMAL LOW (ref 26.0–34.0)
MCHC: 29 g/dL — ABNORMAL LOW (ref 30.0–36.0)
MCV: 87.9 fL (ref 80.0–100.0)
Monocytes Absolute: 0.9 10*3/uL (ref 0.1–1.0)
Monocytes Relative: 8 %
Neutro Abs: 7.9 10*3/uL — ABNORMAL HIGH (ref 1.7–7.7)
Neutrophils Relative %: 76 %
Platelet Count: 540 10*3/uL — ABNORMAL HIGH (ref 150–400)
RBC: 4.63 MIL/uL (ref 3.87–5.11)
RDW: 25 % — ABNORMAL HIGH (ref 11.5–15.5)
WBC Count: 10.5 10*3/uL (ref 4.0–10.5)
nRBC: 0 % (ref 0.0–0.2)

## 2018-09-28 LAB — CMP (CANCER CENTER ONLY)
ALT: 39 U/L (ref 0–44)
AST: 34 U/L (ref 15–41)
Albumin: 3.6 g/dL (ref 3.5–5.0)
Alkaline Phosphatase: 163 U/L — ABNORMAL HIGH (ref 38–126)
Anion gap: 9 (ref 5–15)
BUN: 10 mg/dL (ref 6–20)
CO2: 25 mmol/L (ref 22–32)
Calcium: 9.8 mg/dL (ref 8.9–10.3)
Chloride: 108 mmol/L (ref 98–111)
Creatinine: 0.83 mg/dL (ref 0.44–1.00)
GFR, Est AFR Am: >60 mL/min (ref >60)
GFR, Estimated: >60 mL/min (ref >60)
Glucose, Bld: 112 mg/dL — ABNORMAL HIGH (ref 70–99)
Potassium: 3.9 mmol/L (ref 3.5–5.1)
Sodium: 142 mmol/L (ref 135–145)
Total Bilirubin: <0.2 mg/dL — ABNORMAL LOW (ref 0.3–1.2)
Total Protein: 7.7 g/dL (ref 6.5–8.1)

## 2018-09-28 LAB — MAGNESIUM: Magnesium: 1.9 mg/dL (ref 1.7–2.4)

## 2018-09-28 MED ORDER — SODIUM CHLORIDE 0.9 % IV SOLN
80.0000 mg/m2 | Freq: Once | INTRAVENOUS | Status: AC
  Administered 2018-09-28: 152 mg via INTRAVENOUS
  Filled 2018-09-28: qty 152

## 2018-09-28 MED ORDER — PALONOSETRON HCL INJECTION 0.25 MG/5ML
0.2500 mg | Freq: Once | INTRAVENOUS | Status: AC
  Administered 2018-09-28: 0.25 mg via INTRAVENOUS

## 2018-09-28 MED ORDER — SODIUM CHLORIDE 0.9 % IV SOLN
Freq: Once | INTRAVENOUS | Status: AC
  Administered 2018-09-28: 12:00:00 via INTRAVENOUS
  Filled 2018-09-28: qty 5

## 2018-09-28 MED ORDER — POTASSIUM CHLORIDE 2 MEQ/ML IV SOLN
Freq: Once | INTRAVENOUS | Status: AC
  Administered 2018-09-28: 10:00:00 via INTRAVENOUS
  Filled 2018-09-28: qty 10

## 2018-09-28 MED ORDER — PALONOSETRON HCL INJECTION 0.25 MG/5ML
INTRAVENOUS | Status: AC
  Filled 2018-09-28: qty 5

## 2018-09-28 MED ORDER — SODIUM CHLORIDE 0.9 % IV SOLN
Freq: Once | INTRAVENOUS | Status: AC
  Administered 2018-09-28: 10:00:00 via INTRAVENOUS
  Filled 2018-09-28: qty 250

## 2018-09-28 MED ORDER — OXYCODONE HCL 5 MG PO TABS: 5.0000 mg | ORAL_TABLET | ORAL | 0 refills | Status: DC | PRN

## 2018-09-28 MED ORDER — SODIUM CHLORIDE 0.9 % IV SOLN
100.0000 mg/m2 | Freq: Once | INTRAVENOUS | Status: AC
  Administered 2018-09-28: 190 mg via INTRAVENOUS
  Filled 2018-09-28: qty 9.5

## 2018-09-28 MED FILL — oxyCODONE HCL 5 MG TABS: 5 | 5 days supply | Qty: 60 | Fill #0

## 2018-09-28 NOTE — Progress Notes (Signed)
  Radiation Oncology         [phone] ________________________________  Name: Marissa [name] MRN: [id number]  Date: 09/21/2018  DOB: 1966/01/15  SIMULATION AND TREATMENT PLANNING NOTE  DIAGNOSIS:     ICD-10-CM   1. Malignant neoplasm of bronchus of left upper lobe ([place])  C34.12      Site:  chest  NARRATIVE:  The patient was brought to the [facility].  Identity was confirmed.  All relevant records and images related to the planned course of therapy were reviewed.   Written consent to proceed with treatment was confirmed which was freely given after reviewing the details related to the planned course of therapy had been reviewed with the patient.  Then, the patient was set-up in a stable reproducible  supine position for radiation therapy.  CT images were obtained.  Surface markings were placed.    Medically necessary complex treatment device(s) for immobilization:  Vac-lock bag.   The CT images were loaded into the planning software.  Then the target and avoidance structures were contoured.  Treatment planning then occurred.  The radiation prescription was entered and confirmed.  A total of 4 complex treatment devices were fabricated which relate to the designed radiation treatment fields. Additional reduced fields will be used as necessary to improve the dose homogeneity of the plan. Each of these customized fields/ complex treatment devices will be used on a daily basis during the radiation course. I have requested : 3D Simulation  I have requested a DVH of the following structures: target volume, spinal cord, lungs, heart.   The patient will undergo daily image guidance to ensure accurate localization of the target, and adequate minimize dose to the normal surrounding structures in close proximity to the target.  PLAN:  The patient will receive 60 Gy in 30 fractions initially. The patient will then receive a 6 Gy boost for a final dose of 66 Gy.  Special treatment  procedure The patient will also receive concurrent chemotherapy during the treatment. The patient may therefore experience increased toxicity or side effects and the patient will be monitored for such problems. This may require extra lab work as necessary. This therefore constitutes a special treatment procedure.   ________________________________   Jodelle Gross, MD, PhD

## 2018-09-28 NOTE — Progress Notes (Signed)
  Radiation Oncology         [phone] ________________________________  Name: Marissa [name] MRN: [id number]  Date: 09/21/2018  DOB: 11-12-1965  RESPIRATORY MOTION MANAGEMENT SIMULATION  NARRATIVE:  In order to account for effect of respiratory motion on target structures and other organs in the planning and delivery of radiotherapy, this patient underwent respiratory motion management simulation.  To accomplish this, when the patient was brought to the CT simulation planning suite, 4D respiratoy motion management CT images were obtained.  The CT images were loaded into the planning software.  Then, using a variety of tools including Cine, MIP, and standard views, the target volume and planning target volumes (PTV) were delineated.  Avoidance structures were contoured.  Treatment planning then occurred.  Dose volume histograms were generated and reviewed for each of the requested structure.  The resulting plan was carefully reviewed and approved today.   ------------------------------------------------  Jodelle Gross, MD, PhD

## 2018-09-28 NOTE — Patient Instructions (Signed)
[place] Discharge Instructions for Patients Receiving Chemotherapy  Today you received the following chemotherapy agents: Cisplatin, Etoposide  To help prevent nausea and vomiting after your treatment, we encourage you to take your nausea medication as directed.   If you develop nausea and vomiting that is not controlled by your nausea medication, call the clinic.   BELOW ARE SYMPTOMS THAT SHOULD BE REPORTED IMMEDIATELY:  *FEVER GREATER THAN 100.5 F  *CHILLS WITH OR WITHOUT FEVER  NAUSEA AND VOMITING THAT IS NOT CONTROLLED WITH YOUR NAUSEA MEDICATION  *UNUSUAL SHORTNESS OF BREATH  *UNUSUAL BRUISING OR BLEEDING  TENDERNESS IN MOUTH AND THROAT WITH OR WITHOUT PRESENCE OF ULCERS  *URINARY PROBLEMS  *BOWEL PROBLEMS  UNUSUAL RASH Items with * indicate a potential emergency and should be followed up as soon as possible.  Feel free to call the clinic should you have any questions or concerns. The clinic phone number is [phone].  Please show the [place] at check-in to the Emergency Department and triage nurse.

## 2018-09-28 NOTE — Progress Notes (Signed)
Nutrition Assessment   Reason for Assessment:  Patient identified on Malnutrition Screening tool for weight loss and poor appetite   ASSESSMENT:  54 year old female with small cell lung cancer followed by Dr. Burr Medico.  Past medical history of COPD, ht murmur, depression, pneumothorax.  Patient receiving concurrent chemotherapy and radiation therapy.    Met with patient during infusion.  Patient reports that she has taste alterations.  Reports that cereal with whole milk really taste like is should.  Also ate beef stew with potatoes and carrots last night that was good.    Nutrition Focused Physical Exam: deferred   Medications: zofran, comapzine, medrol (for shoulder pain)   Labs: glucose 126   Anthropometrics:   Height: 62 inches Weight: 174 lb 187 lb on 07/29/2018 BMI: 31  7% weight loss in the last 2 months   Estimated Energy Needs  Kcals: 1700-1975 Protein: 75-100 g Fluid: > 1.7 L/d   NUTRITION DIAGNOSIS: Inadequate oral intake related to cancer related treatment side effects as evidenced by taste alterations and 7% weight loss in the last 2 months    INTERVENTION:  Discussed strategies to help with taste changes.  Handout provided.   Contact information provided   MONITORING, EVALUATION, GOAL: Patient will consume adequate calories and protein to maintain lean muscle mass during treatment   Next Visit: Sept 22 during infusion  Carah Barrientes B. Zenia Resides, [place], [place] Registered Dietitian [phone] (pager)

## 2018-09-28 NOTE — Telephone Encounter (Signed)
Scheduled appt per 9/1 los.

## 2018-09-29 ENCOUNTER — Telehealth: Payer: Self-pay | Admitting: Hematology

## 2018-09-29 ENCOUNTER — Telehealth: Payer: Self-pay | Admitting: Nurse Practitioner

## 2018-09-29 ENCOUNTER — Encounter: Payer: Self-pay | Admitting: General Practice

## 2018-09-29 ENCOUNTER — Inpatient Hospital Stay: Payer: Medicaid Other

## 2018-09-29 ENCOUNTER — Ambulatory Visit: Payer: Medicaid Other

## 2018-09-29 NOTE — Progress Notes (Signed)
[place] CSW Progress Notes  Call from [employer], [employer].  Pt needs to sign paperwork necessary for Medicaid application - [employer] hopes to complete this process while pt is at [facility] tomorrow for infusion/radiation.  [place] states they have already filed patient's application for [place].  Edwyna Shell, LCSW Clinical Social Worker Phone:  [phone]

## 2018-09-29 NOTE — Telephone Encounter (Signed)
Called pt per 9/1 sch message -  R/s appt per 9/1 sch  message . Pt is aware.

## 2018-09-29 NOTE — Telephone Encounter (Signed)
I called Ms. Depaz and gave her knee Xray report which shows degenerative changes in the medial right knee. I recommend NSAIDs, ice, rest for now. We will monitor. She had vomiting last night and this morning and cancelled her radiation and chemo for today. She is feeling better now. Taking compazine and drinking well. She rescheduled her chemo appointment. Has no further needs at this time.  Cira Rue, NP 09/29/2018

## 2018-09-29 NOTE — Progress Notes (Signed)
[place] CSW Progress Notes  CSW will give [employer] gift cards (second disbursement of four) at her appt tomorrow.  Will leave at front desk.  [place] will attempt to connect w patient tomorrow in order to complete required [facility] for [facility] application.  Edwyna Shell, LCSW Clinical Social Worker Phone:  [phone]

## 2018-09-29 NOTE — Progress Notes (Signed)
[place] CSW Progress Notes  Call to patient to discuss her transportation needs.  States she has cancelled todays appointments - has been using [place] transportation for all rides to [facility] appointments.  Does not pay for rides to [facility].  Is currently working w [employer] for disability application and w Altamese Cabal for her Medicaid application.  States that she has signed all paperwork sent to her, is awaiting additional paperwork for her signature to fully complete application process.  Will not take gas card from [employer], wants to reserve grant fund for medications.    Pt states she wants to use [employer] for medications only, declined card offered by [employer] so these funds can be used for medications.  CSW will provide the second $50 [employer] - pt states she has used these funds for food.  Will leave this at check in desk on 9/3.  Pt states she is still in process of signing paperwork for disability application [facility]) and Medicaid ([facility], [place] at [employer]).  CSW will double check status of these application, messages left.  Pt states she called and cancelled today's appointment for radiation - states she is not feeling well enough to come ("wobbly on my feet", didn't sleep well, nauseous).  States she has cancelled these appts - as well as Personal assistant.  CSW encouraged patient to make all appointments, even offered to see if transport could be arranged at last minute.  Pt states she will "make it up on Friday."  Edwyna Shell, [place] Worker Phone:  [phone]

## 2018-09-30 ENCOUNTER — Telehealth: Payer: Self-pay | Admitting: *Deleted

## 2018-09-30 ENCOUNTER — Inpatient Hospital Stay: Payer: Medicaid Other

## 2018-09-30 ENCOUNTER — Inpatient Hospital Stay: Payer: Medicaid Other | Admitting: General Practice

## 2018-09-30 ENCOUNTER — Ambulatory Visit
Admission: RE | Admit: 2018-09-30 | Discharge: 2018-09-30 | Disposition: A | Discharge status: Home / Self Care | Payer: Medicaid Other | Source: Ambulatory Visit | Attending: Radiation Oncology | Admitting: Radiation Oncology

## 2018-09-30 ENCOUNTER — Other Ambulatory Visit: Payer: Self-pay

## 2018-09-30 VITALS — BP 111/60 | HR 77 | Temp 98.8°F | Resp 20

## 2018-09-30 DIAGNOSIS — Z510 Encounter for antineoplastic radiation therapy: Secondary | ICD-10-CM | POA: Diagnosis not present

## 2018-09-30 DIAGNOSIS — C3412 Malignant neoplasm of upper lobe, left bronchus or lung: Secondary | ICD-10-CM

## 2018-09-30 DIAGNOSIS — Z5111 Encounter for antineoplastic chemotherapy: Secondary | ICD-10-CM | POA: Diagnosis not present

## 2018-09-30 DIAGNOSIS — C3490 Malignant neoplasm of unspecified part of unspecified bronchus or lung: Secondary | ICD-10-CM

## 2018-09-30 MED ORDER — SODIUM CHLORIDE 0.9 % IV SOLN
100.0000 mg/m2 | Freq: Once | INTRAVENOUS | Status: AC
  Administered 2018-09-30: 190 mg via INTRAVENOUS
  Filled 2018-09-30: qty 9.5

## 2018-09-30 MED ORDER — SODIUM CHLORIDE 0.9 % IV SOLN
Freq: Once | INTRAVENOUS | Status: AC
  Administered 2018-09-30: 10:00:00 via INTRAVENOUS
  Filled 2018-09-30: qty 250

## 2018-09-30 MED ORDER — DEXAMETHASONE SODIUM PHOSPHATE 10 MG/ML IJ SOLN
INTRAMUSCULAR | Status: AC
  Filled 2018-09-30: qty 1

## 2018-09-30 MED ORDER — DEXAMETHASONE SODIUM PHOSPHATE 10 MG/ML IJ SOLN
10.0000 mg | Freq: Once | INTRAMUSCULAR | Status: AC
  Administered 2018-09-30: 10 mg via INTRAVENOUS

## 2018-09-30 NOTE — Patient Instructions (Signed)
[facility] Discharge Instructions for Patients Receiving Chemotherapy  Today you received the following chemotherapy agents: Etoposide.  To help prevent nausea and vomiting after your treatment, we encourage you to take your nausea medication as directed.   If you develop nausea and vomiting that is not controlled by your nausea medication, call the clinic.   BELOW ARE SYMPTOMS THAT SHOULD BE REPORTED IMMEDIATELY:  *FEVER GREATER THAN 100.5 F  *CHILLS WITH OR WITHOUT FEVER  NAUSEA AND VOMITING THAT IS NOT CONTROLLED WITH YOUR NAUSEA MEDICATION  *UNUSUAL SHORTNESS OF BREATH  *UNUSUAL BRUISING OR BLEEDING  TENDERNESS IN MOUTH AND THROAT WITH OR WITHOUT PRESENCE OF ULCERS  *URINARY PROBLEMS  *BOWEL PROBLEMS  UNUSUAL RASH Items with * indicate a potential emergency and should be followed up as soon as possible.  Feel free to call the clinic should you have any questions or concerns. The clinic phone number is [phone].  Please show the [place] at check-in to the Emergency Department and triage nurse.

## 2018-09-30 NOTE — Progress Notes (Signed)
[place] CSW Progress Notes  Checked in w patient in infusion - gave second $42 disbursement from [employer] as well as option for [employer] produce distribution program.  Pt will utilize both of these for food.  Confirmed w Altamese Cabal that she met w patient at today's visit prior to check in and has obtained required paperwork to begin [facility] application.  Edwyna Shell, LCSW Clinical Social Worker Phone:  [phone]

## 2018-09-30 NOTE — Telephone Encounter (Signed)
Per Adin Hector, NP, called Dr. Maryjean Ka office regarding referral for pain management. CMA stated, referral is under under current review.

## 2018-10-01 ENCOUNTER — Ambulatory Visit
Admission: RE | Admit: 2018-10-01 | Discharge: 2018-10-01 | Disposition: A | Discharge status: Home / Self Care | Payer: Medicaid Other | Source: Ambulatory Visit | Attending: Radiation Oncology | Admitting: Radiation Oncology

## 2018-10-01 ENCOUNTER — Other Ambulatory Visit: Payer: Self-pay | Admitting: Hematology

## 2018-10-01 ENCOUNTER — Inpatient Hospital Stay: Payer: Medicaid Other

## 2018-10-01 ENCOUNTER — Telehealth: Payer: Self-pay | Admitting: Hematology

## 2018-10-01 ENCOUNTER — Other Ambulatory Visit: Payer: Self-pay

## 2018-10-01 VITALS — BP 135/61 | HR 69 | Temp 98.3°F | Resp 18

## 2018-10-01 DIAGNOSIS — Z5111 Encounter for antineoplastic chemotherapy: Secondary | ICD-10-CM | POA: Diagnosis not present

## 2018-10-01 DIAGNOSIS — C3412 Malignant neoplasm of upper lobe, left bronchus or lung: Secondary | ICD-10-CM

## 2018-10-01 DIAGNOSIS — Z510 Encounter for antineoplastic radiation therapy: Secondary | ICD-10-CM | POA: Diagnosis not present

## 2018-10-01 MED ORDER — DEXAMETHASONE SODIUM PHOSPHATE 10 MG/ML IJ SOLN
INTRAMUSCULAR | Status: AC
  Filled 2018-10-01: qty 1

## 2018-10-01 MED ORDER — SODIUM CHLORIDE 0.9 % IV SOLN
100.0000 mg/m2 | Freq: Once | INTRAVENOUS | Status: AC
  Administered 2018-10-01: 190 mg via INTRAVENOUS
  Filled 2018-10-01: qty 9.5

## 2018-10-01 MED ORDER — SODIUM CHLORIDE 0.9 % IV SOLN
Freq: Once | INTRAVENOUS | Status: AC
  Administered 2018-10-01: 13:00:00 via INTRAVENOUS
  Filled 2018-10-01: qty 250

## 2018-10-01 MED ORDER — DEXAMETHASONE SODIUM PHOSPHATE 10 MG/ML IJ SOLN
10.0000 mg | Freq: Once | INTRAMUSCULAR | Status: AC
  Administered 2018-10-01: 10 mg via INTRAVENOUS

## 2018-10-01 NOTE — Progress Notes (Signed)
Pt C/O HA every day after radiation since Tuesday, has hx of HA's, is rating her pain an 8 today.  Shelia Media PA informed, pt instructed to tell radiation staff today at her appointment.  Ok for pt to take her own topamax now per V. Tanner PA.

## 2018-10-01 NOTE — Telephone Encounter (Signed)
Called pt per 9/4 sch message - pt aware of changes and will get an updated schedule today after treatment .

## 2018-10-01 NOTE — Patient Instructions (Signed)
[facility] Discharge Instructions for Patients Receiving Chemotherapy  Today you received the following chemotherapy agents: Etoposide.  To help prevent nausea and vomiting after your treatment, we encourage you to take your nausea medication as directed.   If you develop nausea and vomiting that is not controlled by your nausea medication, call the clinic.   BELOW ARE SYMPTOMS THAT SHOULD BE REPORTED IMMEDIATELY:  *FEVER GREATER THAN 100.5 F  *CHILLS WITH OR WITHOUT FEVER  NAUSEA AND VOMITING THAT IS NOT CONTROLLED WITH YOUR NAUSEA MEDICATION  *UNUSUAL SHORTNESS OF BREATH  *UNUSUAL BRUISING OR BLEEDING  TENDERNESS IN MOUTH AND THROAT WITH OR WITHOUT PRESENCE OF ULCERS  *URINARY PROBLEMS  *BOWEL PROBLEMS  UNUSUAL RASH Items with * indicate a potential emergency and should be followed up as soon as possible.  Feel free to call the clinic should you have any questions or concerns. The clinic phone number is [phone].  Please show the [place] at check-in to the Emergency Department and triage nurse.

## 2018-10-02 ENCOUNTER — Inpatient Hospital Stay: Payer: Medicaid Other

## 2018-10-05 ENCOUNTER — Ambulatory Visit: Payer: Medicaid Other

## 2018-10-05 MED FILL — TOPIRAMATE 50 MG TABLET: 50 | 30 days supply | Qty: 30 | Fill #1

## 2018-10-06 ENCOUNTER — Ambulatory Visit
Admission: RE | Admit: 2018-10-06 | Discharge: 2018-10-06 | Disposition: A | Discharge status: Home / Self Care | Payer: Medicaid Other | Source: Ambulatory Visit | Attending: Radiation Oncology | Admitting: Radiation Oncology

## 2018-10-06 ENCOUNTER — Other Ambulatory Visit: Payer: Self-pay

## 2018-10-06 DIAGNOSIS — Z510 Encounter for antineoplastic radiation therapy: Secondary | ICD-10-CM | POA: Diagnosis not present

## 2018-10-07 ENCOUNTER — Telehealth: Payer: Self-pay

## 2018-10-07 ENCOUNTER — Ambulatory Visit: Payer: Medicaid Other

## 2018-10-07 ENCOUNTER — Other Ambulatory Visit: Payer: Self-pay

## 2018-10-07 DIAGNOSIS — C3412 Malignant neoplasm of upper lobe, left bronchus or lung: Secondary | ICD-10-CM

## 2018-10-07 MED ORDER — SODIUM CHLORIDE 0.9 % IV SOLN
INTRAVENOUS | Status: DC
  Filled 2018-10-07: qty 250

## 2018-10-07 NOTE — Telephone Encounter (Signed)
Spoke with patient and informed her to be here tomorrow 9/11 at 11:30 for labs and IVF prior to her RT treatment.  She verbalized an understanding and states she will do this, scheduling message was sent and orders were placed.

## 2018-10-07 NOTE — Telephone Encounter (Signed)
Received notification from Marissa [name] in RT that Marissa [name] cancelled her appointment for radiation treatment today.  Marissa [name] c/o N/V/D and T99.9.   I called the Marissa [name] and spoke with her she says her temperature is down to 97 and does not feel like she has a fever.  Started feeling badly after her chemo treatment last Friday 9/4.  She had nausea every since treatment, yesterday had a couple of episodes of diarrhea, however this morning 4-5 episodes of diarrhea and vomiting this morning.  Vomiting has stopped and diarrhea seems to have just about stopped.  She has been taking the antiemetics she has on hand.  I instructed her to get OTC Imodium to have on hand as well. To take 2 tablets at first episode of diarrhea and one tablet with every episode thereafter.  She thinks she may need some IVF.   I told her I would see what we could arrange prior to her RT tomorrow afternoon and let her know.

## 2018-10-08 ENCOUNTER — Inpatient Hospital Stay: Payer: Self-pay | Attending: Hematology | Admitting: Hematology

## 2018-10-08 ENCOUNTER — Encounter: Payer: Self-pay | Admitting: Hematology

## 2018-10-08 ENCOUNTER — Inpatient Hospital Stay: Payer: Medicaid Other

## 2018-10-08 ENCOUNTER — Telehealth: Payer: Self-pay | Admitting: Hematology

## 2018-10-08 ENCOUNTER — Other Ambulatory Visit: Payer: Self-pay

## 2018-10-08 ENCOUNTER — Ambulatory Visit
Admission: RE | Admit: 2018-10-08 | Discharge: 2018-10-08 | Disposition: A | Discharge status: Home / Self Care | Payer: Medicaid Other | Source: Ambulatory Visit | Attending: Radiation Oncology | Admitting: Radiation Oncology

## 2018-10-08 VITALS — BP 150/75 | HR 81 | Temp 98.5°F | Resp 18 | Ht 62.0 in | Wt 172.6 lb

## 2018-10-08 DIAGNOSIS — J90 Pleural effusion, not elsewhere classified: Secondary | ICD-10-CM | POA: Insufficient documentation

## 2018-10-08 DIAGNOSIS — C3412 Malignant neoplasm of upper lobe, left bronchus or lung: Secondary | ICD-10-CM

## 2018-10-08 DIAGNOSIS — R630 Anorexia: Secondary | ICD-10-CM | POA: Insufficient documentation

## 2018-10-08 DIAGNOSIS — Z5111 Encounter for antineoplastic chemotherapy: Secondary | ICD-10-CM | POA: Diagnosis not present

## 2018-10-08 DIAGNOSIS — T50905A Adverse effect of unspecified drugs, medicaments and biological substances, initial encounter: Secondary | ICD-10-CM

## 2018-10-08 DIAGNOSIS — Z79899 Other long term (current) drug therapy: Secondary | ICD-10-CM | POA: Insufficient documentation

## 2018-10-08 DIAGNOSIS — R112 Nausea with vomiting, unspecified: Secondary | ICD-10-CM

## 2018-10-08 DIAGNOSIS — R197 Diarrhea, unspecified: Secondary | ICD-10-CM

## 2018-10-08 DIAGNOSIS — Z510 Encounter for antineoplastic radiation therapy: Secondary | ICD-10-CM | POA: Diagnosis not present

## 2018-10-08 DIAGNOSIS — R110 Nausea: Secondary | ICD-10-CM | POA: Insufficient documentation

## 2018-10-08 LAB — CMP (CANCER CENTER ONLY)
ALT: 13 U/L (ref 0–44)
AST: 14 U/L — ABNORMAL LOW (ref 15–41)
Albumin: 3.9 g/dL (ref 3.5–5.0)
Alkaline Phosphatase: 105 U/L (ref 38–126)
Anion gap: 7 (ref 5–15)
BUN: 19 mg/dL (ref 6–20)
CO2: 27 mmol/L (ref 22–32)
Calcium: 8.6 mg/dL — ABNORMAL LOW (ref 8.9–10.3)
Chloride: 108 mmol/L (ref 98–111)
Creatinine: 0.83 mg/dL (ref 0.44–1.00)
GFR, Est AFR Am: >60 mL/min (ref >60)
GFR, Estimated: >60 mL/min (ref >60)
Glucose, Bld: 100 mg/dL — ABNORMAL HIGH (ref 70–99)
Potassium: 3.6 mmol/L (ref 3.5–5.1)
Sodium: 142 mmol/L (ref 135–145)
Total Bilirubin: <0.2 mg/dL — ABNORMAL LOW (ref 0.3–1.2)
Total Protein: 7.4 g/dL (ref 6.5–8.1)

## 2018-10-08 LAB — CBC WITH DIFFERENTIAL (CANCER CENTER ONLY)
Abs Immature Granulocytes: 0.03 10*3/uL (ref 0.00–0.07)
Basophils Absolute: 0.1 10*3/uL (ref 0.0–0.1)
Basophils Relative: 2 %
Eosinophils Absolute: 0 10*3/uL (ref 0.0–0.5)
Eosinophils Relative: 0 %
HCT: 36.4 % (ref 36.0–46.0)
Hemoglobin: 11 g/dL — ABNORMAL LOW (ref 12.0–15.0)
Immature Granulocytes: 1 %
Lymphocytes Relative: 18 %
Lymphs Abs: 1 10*3/uL (ref 0.7–4.0)
MCH: 26.6 pg (ref 26.0–34.0)
MCHC: 30.2 g/dL (ref 30.0–36.0)
MCV: 87.9 fL (ref 80.0–100.0)
Monocytes Absolute: 0.3 10*3/uL (ref 0.1–1.0)
Monocytes Relative: 6 %
Neutro Abs: 4.1 10*3/uL (ref 1.7–7.7)
Neutrophils Relative %: 73 %
Platelet Count: 241 10*3/uL (ref 150–400)
RBC: 4.14 MIL/uL (ref 3.87–5.11)
RDW: 22.8 % — ABNORMAL HIGH (ref 11.5–15.5)
WBC Count: 5.6 10*3/uL (ref 4.0–10.5)
nRBC: 0 % (ref 0.0–0.2)

## 2018-10-08 LAB — MAGNESIUM: Magnesium: 2 mg/dL (ref 1.7–2.4)

## 2018-10-08 MED ORDER — SONAFINE EX EMUL
1.0000 "application " | Freq: Once | CUTANEOUS | Status: AC
  Administered 2018-10-08: 1 via TOPICAL

## 2018-10-08 MED ORDER — NYSTATIN 100000 UNIT/GM EX POWD: Freq: Four times a day (QID) | CUTANEOUS | 1 refills | Status: DC

## 2018-10-08 MED ORDER — SODIUM CHLORIDE 0.9 % IV SOLN
Freq: Once | INTRAVENOUS | Status: DC
  Administered 2018-10-08: 13:00:00 via INTRAVENOUS
  Filled 2018-10-08: qty 250

## 2018-10-08 MED FILL — PROCHLORPERAZINE 10 MG TAB: 10 | 7 days supply | Qty: 30 | Fill #1

## 2018-10-08 MED FILL — NYSTATIN 100000 UNIT/GM POW: 100000 | 7 days supply | Qty: 15 | Fill #0

## 2018-10-08 NOTE — Progress Notes (Signed)
Pt here for patient teaching.  Pt given Radiation and You booklet and Sonafine.  Reviewed areas of pertinence such as fatigue, hair loss, skin changes, throat changes, cough and shortness of breath . Pt able to give teach back of to pat skin and use unscented/gentle soap,apply Sonafine bid and avoid applying anything to skin within 4 hours of treatment. Pt verbalized understanding of information given and will contact nursing with any questions or concerns.     Gloriajean Dell. Leonie Green, BSN

## 2018-10-08 NOTE — Progress Notes (Signed)
Patient was given two coupons for Ensure to supplement.

## 2018-10-08 NOTE — Progress Notes (Signed)
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)  07/29/2018 Imaging   CT Angio Chest 07/29/18  IMPRESSION: 1. Malignant appearing left mediastinal and hilar lymphadenopathy narrowing the airways at the left hilum. In the left lung only a subtle 9 mm spiculated nodule is identified in the lingula. Consider small cell carcinoma. Bronchoscopy or mediastinal node sampling might be most valuable for diagnosis. 2. Indeterminate although low-density (which typically which indicates a benign adenoma) left adrenal nodule. 3.  Negative for acute pulmonary embolus.   08/24/2018 Definitive Surgery   Bronchoscopy and EBUS biopsy on 08/24/18 by Dr. Loanne Drilling   08/24/2018 Initial Biopsy   Diagnosis 08/24/18 FINE NEEDLE ASPIRATION, ENDOSCOPIC, (EBUS) STATION # 7 LYMPH NODE(SPECIMEN 1 OF 4 COLLECTED 08/24/18): NO MALIGNANT CELLS IDENTIFIED.  FINE NEEDLE ASPIRATION, ENDOSCOPIC, (EBUS) LEFT HILAR LYMPH NODE(SPECIMEN 2 OF 4 COLLECTED 08/24/18): NO MALIGNANT CELLS IDENTIFIED.  TRANSBRONCHIAL NEEDLE ASPIRATION, WANG, (SPECIMEN 3 OF 4 COLLECTED 08/24/18): NO MALIGNANT CELLS IDENTIFIED.  BRONCHIAL BRUSHING, LUL (SPECIMEN 4 OF 4 COLLECTED 08/24/18): MALIGNANT CELLS PRESENT, MOST CONSISTENT WITH SMALL CELL CARCINOMA. SEE COMMENT.   08/26/2018 Initial Diagnosis   Small cell lung cancer, left ([place])   08/26/2018 Imaging   CT AP W Contrast 08/26/18  IMPRESSION: 1. 2.9 cm left adrenal nodule cannot be definitively characterized on this study. Potentially a lipid poor adenoma, but metastatic disease not excluded. MRI of the abdomen with and without contrast may prove helpful to further evaluate. 2. No other findings to suggest  metastatic disease in the abdomen/pelvis. 3. Left base atelectasis with small left pleural effusion.   08/27/2018 Imaging   MRI brain 08/27/18  IMPRESSION: 1. Motion degraded, incomplete examination. 2. Subcentimeter focus of diffusion signal abnormality in the left cerebellum. No enhancement is evident to strongly suggest a metastasis, and this may reflect an acute to subacute small vessel infarct. 3. No evidence of intracranial metastatic disease elsewhere. 4. Given motion artifact and incomplete postcontrast imaging, consider short-term follow-up MRI (possibly with sedation) to further evaluate the left cerebellar abnormality and provide a more thorough evaluation for metastatic disease.   09/02/2018 PET scan   IMPRESSION: 1. Hypermetabolic left hilar mass is identified encasing the left mainstem bronchus resulting in postobstructive atelectasis in the left upper lobe. 2. Hypermetabolic left mediastinal nodal metastasis. 3. Loculated left pleural effusion is identified with mild increased FDG uptake. Cannot rule out malignant pleural effusion. 4. No evidence for metastatic disease to the abdomen or pelvis or skeletal structures.     09/07/2018 -  Chemotherapy   Concurrent chemoRT with IV cisplatin on day 1 and etopside day 1-3 for every 3 weeks starting 8/11. Will start radiation with Dr. Lisbeth Renshaw with cycle 2.    09/14/2018 Procedure   Thoracentesis yielding 750 mL   09/28/2018 -  Radiation Therapy   Concurrent chemoRT with Dr. Lisbeth Renshaw. Start with cycle 2 chemo on 09/28/18      CURRENT THERAPY: Concurrent chemo and radiation  INTERVAL HISTORY: Marissa [name] called to my office for recurrent nausea and vomiting since after chemotherapy on September 4.  She started radiation on September 8.  She has been taking Compazine 4 times a day, still have intermittent vomiting.  Her appetite is very low, she eats little,  piece of bread most of time.  She has tried protein powder, has not started any  nutritional supplement.  She lost 2 pounds in the past 10 days.  She is able to function at home.  She still has severe left chest and shoulder pain, especially after a difficult position at radiation.  She takes oxycodone 4 to 5 tablets a day.  She also noticed mild skin rash on her back, chest.  No fever or chills, no diarrhea.  Review of system otherwise negative.  MEDICAL HISTORY:  Past Medical History:  Diagnosis Date  . Asthma   . Chronic headaches   . COPD (chronic obstructive pulmonary disease) ([place])   . Depression   . Heart murmur   . Small cell lung cancer ([place])     SURGICAL HISTORY: Past Surgical History:  Procedure Laterality Date  . BRONCHIAL BIOPSY  08/24/2018   Procedure: BRONCHIAL BIOPSIES;  Surgeon: Margaretha Seeds, MD;  Location: [facility];  Service: Cardiopulmonary;;  . BRONCHIAL BRUSHINGS  08/24/2018   Procedure: BRONCHIAL BRUSHINGS;  Surgeon: Margaretha Seeds, MD;  Location: [facility];  Service: Cardiopulmonary;;  . BRONCHIAL NEEDLE ASPIRATION BIOPSY  08/24/2018   Procedure: BRONCHIAL NEEDLE ASPIRATION BIOPSIES;  Surgeon: Margaretha Seeds, MD;  Location: [facility];  Service: Cardiopulmonary;;  . ENDOBRONCHIAL ULTRASOUND N/A 08/24/2018   Procedure: ENDOBRONCHIAL ULTRASOUND;  Surgeon: Margaretha Seeds, MD;  Location: [facility];  Service: Cardiopulmonary;  Laterality: N/A;  . IR THORACENTESIS ASP PLEURAL SPACE W/IMG GUIDE  09/14/2018  . TUBAL LIGATION    . VIDEO BRONCHOSCOPY  08/24/2018   Procedure: VIDEO BRONCHOSCOPY;  Surgeon: Margaretha Seeds, MD;  Location: [facility];  Service: Cardiopulmonary;;    I have reviewed the social history and family history with the patient and they are unchanged from previous note.  ALLERGIES:  is allergic to penicillins.  MEDICATIONS:  Current Outpatient Medications  Medication Sig Dispense Refill  . albuterol (VENTOLIN HFA) 108 (90 Base) MCG/ACT inhaler Inhale 2 puffs into the lungs every 6 (six) hours as  needed for wheezing or shortness of breath. 6.7 g 3  . baclofen (LIORESAL) 10 MG tablet Take 1 tablet (10 mg total) by mouth 3 (three) times daily. (Patient not taking: Reported on 10/08/2018) 30 each 0  . budesonide-formoterol (SYMBICORT) 160-4.5 MCG/ACT inhaler Inhale 2 puffs into the lungs 2 (two) times daily. 1 Inhaler 0  . gabapentin (NEURONTIN) 300 MG capsule Take 1 capsule (300 mg total) by mouth 3 (three) times daily. 90 capsule 0  . ipratropium-albuterol (DUONEB) 0.5-2.5 (3) MG/3ML SOLN Take 3 mLs by nebulization every 6 (six) hours as needed. (Patient taking differently: Take 3 mLs by nebulization every 6 (six) hours as needed (wheezing/shortness  of breath). ) 360 mL 0  . loratadine (CLARITIN) 10 MG tablet Take 10 mg by mouth daily as needed for allergies.    Marland Kitchen morphine (MS CONTIN) 15 MG 12 hr tablet Take 1 tablet (15 mg total) by mouth every 12 (twelve) hours. 60 tablet 0  . nicotine polacrilex (NICORETTE) 4 MG gum Take 4 mg by mouth as needed for smoking cessation.    Marland Kitchen nystatin (MYCOSTATIN/NYSTOP) powder Apply topically 4 (four) times daily. 15 g 1  . ondansetron (ZOFRAN) 8 MG tablet Take 1 tablet (8 mg total) by mouth 2 (two) times daily as needed. Start on the third day after cisplatin chemotherapy. (Patient taking differently: Take 8 mg by mouth 2 (two) times daily as needed for nausea or vomiting. Start on the  third day after cisplatin chemotherapy.) 30 tablet 1  . oxyCODONE (OXY IR/ROXICODONE) 5 MG immediate release tablet Take 1-2 tablets (5-10 mg total) by mouth every 4 (four) hours as needed for severe pain. 60 tablet 0  . PRESCRIPTION MEDICATION Take 1 tablet by mouth as needed (anxiety/sleep).    . prochlorperazine (COMPAZINE) 10 MG tablet Take 1 tablet (10 mg total) by mouth every 6 (six) hours as needed (Nausea or vomiting). 30 tablet 1  . tiotropium (SPIRIVA) 18 MCG inhalation capsule Place 1 capsule (18 mcg total) into inhaler and inhale daily. (Patient not taking: Reported on  09/28/2018) 30 capsule 6  . topiramate (TOPAMAX) 50 MG tablet Take 0.5-1 tablets (25-50 mg total) by mouth daily. 60 tablet 2  . traZODone (DESYREL) 100 MG tablet Take 1 tablet (100 mg total) by mouth at bedtime. Patient will pick up scripts today. 90 tablet 2   No current facility-administered medications for this visit.     PHYSICAL EXAMINATION: ECOG PERFORMANCE STATUS: 2 - Symptomatic, <50% confined to bed  There were no vitals filed for this visit. There were no vitals filed for this visit.  GENERAL:alert, no distress and comfortable SKIN: skin color, texture, turgor are normal, no rashes or significant lesions, except a few papular rash on the upper neck.   NECK: supple, thyroid normal size, non-tender, without nodularity LYMPH:  no palpable lymphadenopathy in the cervical, axillary or inguinal LUNGS: clear to auscultation and percussion with normal breathing effort HEART: regular rate & rhythm and no murmurs and no lower extremity edema ABDOMEN:abdomen soft, non-tender and normal bowel sounds Musculoskeletal:no cyanosis of digits and no clubbing  NEURO: alert & oriented x 3 with fluent speech, no focal motor/sensory deficits  LABORATORY DATA:  I have reviewed the data as listed CBC Latest Ref Rng & Units 10/08/2018 09/28/2018 09/15/2018  WBC 4.0 - 10.5 K/uL 5.6 10.5 7.3  Hemoglobin 12.0 - 15.0 g/dL 11.0(L) 11.8(L) 10.4(L)  Hematocrit 36.0 - 46.0 % 36.4 40.7 36.3  Platelets 150 - 400 K/uL 241 540(H) 265     CMP Latest Ref Rng & Units 10/08/2018 09/28/2018 09/15/2018  Glucose 70 - 99 mg/dL 100(H) 112(H) 126(H)  BUN 6 - 20 mg/dL 19 10 12   Creatinine 0.44 - 1.00 mg/dL 0.83 0.83 0.73  Sodium 135 - 145 mmol/L 142 142 141  Potassium 3.5 - 5.1 mmol/L 3.6 3.9 3.6  Chloride 98 - 111 mmol/L 108 108 103  CO2 22 - 32 mmol/L 27 25 29   Calcium 8.9 - 10.3 mg/dL 8.6(L) 9.8 9.4  Total Protein 6.5 - 8.1 g/dL 7.4 7.7 7.7  Total Bilirubin 0.3 - 1.2 mg/dL <0.2(L) <0.2(L) 0.2(L)  Alkaline Phos 38 -  126 U/L 105 163(H) 178(H)  AST 15 - 41 U/L 14(L) 34 10(L)  ALT 0 - 44 U/L 13 39 16      RADIOGRAPHIC STUDIES: I have personally reviewed the radiological images as listed and agreed with the findings in the report. No results found.   ASSESSMENT & PLAN:  1.  Nausea, vomiting, poor appetite, secondary to chemo 2.  Small cell lung cancer, stage III 3.  COPD 4.  History of tension pneumothorax after biopsy, resolved   Plan -I again reviewed dizzy antiemetics, she will continue Compazine 3-4 times a day before meals, and use Zofran as needed in between -I encouraged her to start nutritional supplement, such as Ensure boost, we give her her coupons today -She will follow-up with our dietitian -She will get IV fluids 1  L normal saline today, lab reviewed no significant dehydration or electrolyte abnormality. -Continue MS Contin and oxycodone as needed for left chest pain, will call Dr. Donell Sievert office regarding her referral -We will schedule her to see NP Lacie middle of next week with IV fluids   All questions were answered. The patient knows to call the clinic with any problems, questions or concerns. No barriers to learning was detected.  I spent 15 minutes counseling the patient face to face. The total time spent in the appointment was 20 minutes and more than 50% was on counseling and review of test results     [name], MD 10/08/18

## 2018-10-08 NOTE — Telephone Encounter (Signed)
Scheduled appt per 9/11 los. Spoke with patient and she is aware of her appt date and time.

## 2018-10-08 NOTE — Patient Instructions (Signed)
Dehydration, Adult  Dehydration is a condition in which there is not enough fluid or water in the body. This happens when you lose more fluids than you take in. Important organs, such as the kidneys, brain, and heart, cannot function without a proper amount of fluids. Any loss of fluids from the body can lead to dehydration. Dehydration can range from mild to severe. This condition should be treated right away to prevent it from becoming severe. What are the causes? This condition may be caused by:  Vomiting.  Diarrhea.  Excessive sweating, such as from heat exposure or exercise.  Not drinking enough fluid, especially: ? When ill. ? While doing activity that requires a lot of energy.  Excessive urination.  Fever.  Infection.  Certain medicines, such as medicines that cause the body to lose excess fluid (diuretics).  Inability to access safe drinking water.  Reduced physical ability to get adequate water and food. What increases the risk? This condition is more likely to develop in people:  Who have a poorly controlled long-term (chronic) illness, such as diabetes, heart disease, or kidney disease.  Who are age 52 or older.  Who are disabled.  Who live in a place with high altitude.  Who play endurance sports. What are the signs or symptoms? Symptoms of mild dehydration may include:  Thirst.  Dry lips.  Slightly dry mouth.  Dry, warm skin.  Dizziness. Symptoms of moderate dehydration may include:  Very dry mouth.  Muscle cramps.  Dark urine. Urine may be the color of tea.  Decreased urine production.  Decreased tear production.  Heartbeat that is irregular or faster than normal (palpitations).  Headache.  Light-headedness, especially when you stand up from a sitting position.  Fainting (syncope). Symptoms of severe dehydration may include:  Changes in skin, such as: ? Cold and clammy skin. ? Blotchy (mottled) or pale skin. ? Skin that does  not quickly return to normal after being lightly pinched and released (poor skin turgor).  Changes in body fluids, such as: ? Extreme thirst. ? No tear production. ? Inability to sweat when body temperature is high, such as in hot weather. ? Very little urine production.  Changes in vital signs, such as: ? Weak pulse. ? Pulse that is more than 100 beats a minute when sitting still. ? Rapid breathing. ? Low blood pressure.  Other changes, such as: ? Sunken eyes. ? Cold hands and feet. ? Confusion. ? Lack of energy (lethargy). ? Difficulty waking up from sleep. ? Short-term weight loss. ? Unconsciousness. How is this diagnosed? This condition is diagnosed based on your symptoms and a physical exam. Blood and urine tests may be done to help confirm the diagnosis. How is this treated? Treatment for this condition depends on the severity. Mild or moderate dehydration can often be treated at home. Treatment should be started right away. Do not wait until dehydration becomes severe. Severe dehydration is an emergency and it needs to be treated in a hospital. Treatment for mild dehydration may include:  Drinking more fluids.  Replacing salts and minerals in your blood (electrolytes) that you may have lost. Treatment for moderate dehydration may include:  Drinking an oral rehydration solution (ORS). This is a drink that helps you replace fluids and electrolytes (rehydrate). It can be found at pharmacies and retail stores. Treatment for severe dehydration may include:  Receiving fluids through an IV tube.  Receiving an electrolyte solution through a feeding tube that is passed through your nose and  into your stomach (nasogastric tube, or NG tube).  Correcting any abnormalities in electrolytes.  Treating the underlying cause of dehydration. Follow these instructions at home:  If directed by your health care provider, drink an ORS: ? Make an ORS by following instructions on the  package. ? Start by drinking small amounts, about  cup (120 mL) every 5-10 minutes. ? Slowly increase how much you drink until you have taken the amount recommended by your health care provider.  Drink enough clear fluid to keep your urine clear or pale yellow. If you were told to drink an ORS, finish the ORS first, then start slowly drinking other clear fluids. Drink fluids such as: ? Water. Do not drink only water. Doing that can lead to having too little salt (sodium) in the body (hyponatremia). ? Ice chips. ? Fruit juice that you have added water to (diluted fruit juice). ? Low-calorie sports drinks.  Avoid: ? Alcohol. ? Drinks that contain a lot of sugar. These include high-calorie sports drinks, fruit juice that is not diluted, and soda. ? Caffeine. ? Foods that are greasy or contain a lot of fat or sugar.  Take over-the-counter and prescription medicines only as told by your health care provider.  Do not take sodium tablets. This can lead to having too much sodium in the body (hypernatremia).  Eat foods that contain a healthy balance of electrolytes, such as bananas, oranges, potatoes, tomatoes, and spinach.  Keep all follow-up visits as told by your health care provider. This is important. Contact a health care provider if:  You have abdominal pain that: ? Gets worse. ? Stays in one area (localizes).  You have a rash.  You have a stiff neck.  You are more irritable than usual.  You are sleepier or more difficult to wake up than usual.  You feel weak or dizzy.  You feel very thirsty.  You have urinated only a small amount of very dark urine over 6-8 hours. Get help right away if:  You have symptoms of severe dehydration.  You cannot drink fluids without vomiting.  Your symptoms get worse with treatment.  You have a fever.  You have a severe headache.  You have vomiting or diarrhea that: ? Gets worse. ? Does not go away.  You have blood or green matter  (bile) in your vomit.  You have blood in your stool. This may cause stool to look black and tarry.  You have not urinated in 6-8 hours.  You faint.  Your heart rate while sitting still is over 100 beats a minute.  You have trouble breathing. This information is not intended to replace advice given to you by your health care provider. Make sure you discuss any questions you have with your health care provider. Document Released: 01/13/2005 Document Revised: 12/26/2016 Document Reviewed: 03/09/2015 Elsevier Patient Education  2020 [employer].

## 2018-10-11 ENCOUNTER — Inpatient Hospital Stay: Payer: Medicaid Other | Admitting: Hematology

## 2018-10-11 ENCOUNTER — Other Ambulatory Visit: Payer: Self-pay

## 2018-10-11 ENCOUNTER — Ambulatory Visit
Admission: RE | Admit: 2018-10-11 | Discharge: 2018-10-11 | Disposition: A | Discharge status: Home / Self Care | Payer: Medicaid Other | Source: Ambulatory Visit | Attending: Radiation Oncology | Admitting: Radiation Oncology

## 2018-10-11 ENCOUNTER — Inpatient Hospital Stay: Payer: Medicaid Other

## 2018-10-11 DIAGNOSIS — Z510 Encounter for antineoplastic radiation therapy: Secondary | ICD-10-CM | POA: Diagnosis not present

## 2018-10-12 ENCOUNTER — Other Ambulatory Visit: Payer: Self-pay

## 2018-10-12 ENCOUNTER — Ambulatory Visit: Payer: Self-pay | Admitting: Nurse Practitioner

## 2018-10-12 ENCOUNTER — Telehealth: Payer: Self-pay | Admitting: Nurse Practitioner

## 2018-10-12 ENCOUNTER — Ambulatory Visit: Payer: Self-pay

## 2018-10-12 ENCOUNTER — Ambulatory Visit
Admission: RE | Admit: 2018-10-12 | Discharge: 2018-10-12 | Disposition: A | Discharge status: Home / Self Care | Payer: Medicaid Other | Source: Ambulatory Visit | Attending: Radiation Oncology | Admitting: Radiation Oncology

## 2018-10-12 DIAGNOSIS — M25512 Pain in left shoulder: Secondary | ICD-10-CM

## 2018-10-12 DIAGNOSIS — C3492 Malignant neoplasm of unspecified part of left bronchus or lung: Secondary | ICD-10-CM

## 2018-10-12 DIAGNOSIS — Z510 Encounter for antineoplastic radiation therapy: Secondary | ICD-10-CM | POA: Diagnosis not present

## 2018-10-12 MED ORDER — MORPHINE SULFATE ER 15 MG PO TBCR: 15.0000 mg | EXTENDED_RELEASE_TABLET | Freq: Two times a day (BID) | ORAL | 0 refills | Status: DC

## 2018-10-12 MED ORDER — BACLOFEN 10 MG PO TABS: 10.0000 mg | ORAL_TABLET | Freq: Three times a day (TID) | ORAL | 0 refills | Status: DC | PRN

## 2018-10-12 MED ORDER — OXYCODONE HCL 5 MG PO TABS: 5.0000 mg | ORAL_TABLET | ORAL | 0 refills | Status: DC | PRN

## 2018-10-12 MED FILL — BACLOFEN 10 MG TABS: 10 | 10 days supply | Qty: 30 | Fill #0

## 2018-10-12 NOTE — Telephone Encounter (Signed)
I called [name] due to no show for lab and f/u with me this morning. She is waiting on her ride from our transportation service that did not come. She is feeling OK. She started drinking Boost 1 per day but trying to save them for after chemo when she needs it more. Denies much n/v/d this week, took an anti-emetic this morning and feels well. She does report intermittent menstrual-like cramps but has not had a cycle in 5 years. Denies bleeding. She was prescribed baclofen for cramps a month ago which helped. Cramps come monthly. Other back and shoulder pain controlled on current pain regimen. Will refill pain meds today to fill on 9/17. Headaches are worse after chemo, she is waiting on topamax refill from PCP. Denies fever, chills. She is breathing well. She prefers to cancel IVF today and reschedule for week after chemo, I will send schedule message. Will see her 9/21 with next cycle. She will be here later today for radiation. She understands the plan and appreciates the call.  Marissa Rue, NP  10/12/2018

## 2018-10-13 ENCOUNTER — Other Ambulatory Visit: Payer: Self-pay

## 2018-10-13 ENCOUNTER — Ambulatory Visit
Admission: RE | Admit: 2018-10-13 | Discharge: 2018-10-13 | Disposition: A | Discharge status: Home / Self Care | Payer: Medicaid Other | Source: Ambulatory Visit | Attending: Radiation Oncology | Admitting: Radiation Oncology

## 2018-10-13 DIAGNOSIS — Z510 Encounter for antineoplastic radiation therapy: Secondary | ICD-10-CM | POA: Diagnosis not present

## 2018-10-14 ENCOUNTER — Ambulatory Visit
Admission: RE | Admit: 2018-10-14 | Discharge: 2018-10-14 | Disposition: A | Discharge status: Home / Self Care | Payer: Medicaid Other | Source: Ambulatory Visit | Attending: Radiation Oncology | Admitting: Radiation Oncology

## 2018-10-14 ENCOUNTER — Other Ambulatory Visit: Payer: Self-pay

## 2018-10-14 DIAGNOSIS — Z510 Encounter for antineoplastic radiation therapy: Secondary | ICD-10-CM | POA: Diagnosis not present

## 2018-10-14 MED FILL — MORPHINE SULF ER 15 MG TAB: 15 | 30 days supply | Qty: 60 | Fill #0

## 2018-10-14 MED FILL — oxyCODONE HCL 5 MG TABS: 5 | 5 days supply | Qty: 60 | Fill #0

## 2018-10-14 NOTE — Progress Notes (Signed)
[place]   Telephone:([phone]) [phone] Fax:([phone]) [phone]   Clinic Follow up Note   Patient Care Team: Gildardo Pounds, NP as PCP - General (Nurse Practitioner)  Date of Service:  10/18/2018  CHIEF COMPLAINT:  F/u of Small Cell Lung Cancer   SUMMARY OF ONCOLOGIC HISTORY: Oncology History Overview Note  Cancer Staging Small cell lung cancer, left upper lobe ([place]) Staging form: Lung, AJCC 8th Edition - Clinical stage from 08/24/2018: Stage IIIA (cT1a, cN2, cM0) - Signed by [name], MD on 10/17/2018    Small cell lung cancer, left upper lobe ([place])  07/29/2018 Imaging   CT Angio Chest 07/29/18  IMPRESSION: 1. Malignant appearing left mediastinal and hilar lymphadenopathy narrowing the airways at the left hilum. In the left lung only a subtle 9 mm spiculated nodule is identified in the lingula. Consider small cell carcinoma. Bronchoscopy or mediastinal node sampling might be most valuable for diagnosis. 2. Indeterminate although low-density (which typically which indicates a benign adenoma) left adrenal nodule. 3.  Negative for acute pulmonary embolus.   08/24/2018 Definitive Surgery   Bronchoscopy and EBUS biopsy on 08/24/18 by Dr. Loanne Drilling   08/24/2018 Initial Biopsy   Diagnosis 08/24/18 FINE NEEDLE ASPIRATION, ENDOSCOPIC, (EBUS) STATION # 7 LYMPH NODE(SPECIMEN 1 OF 4 COLLECTED 08/24/18): NO MALIGNANT CELLS IDENTIFIED.  FINE NEEDLE ASPIRATION, ENDOSCOPIC, (EBUS) LEFT HILAR LYMPH NODE(SPECIMEN 2 OF 4 COLLECTED 08/24/18): NO MALIGNANT CELLS IDENTIFIED.  TRANSBRONCHIAL NEEDLE ASPIRATION, WANG, (SPECIMEN 3 OF 4 COLLECTED 08/24/18): NO MALIGNANT CELLS IDENTIFIED.  BRONCHIAL BRUSHING, LUL (SPECIMEN 4 OF 4 COLLECTED 08/24/18): MALIGNANT CELLS PRESENT, MOST CONSISTENT WITH SMALL CELL CARCINOMA. SEE COMMENT.   08/24/2018 Cancer Staging   Staging form: Lung, AJCC 8th Edition - Clinical stage from 08/24/2018: Stage IIIA (cT1a, cN2, cM0) - Signed by [name], MD on 10/17/2018  08/26/2018 Initial Diagnosis   Small cell lung cancer, left ([place])   08/26/2018 Imaging   CT AP W Contrast 08/26/18  IMPRESSION: 1. 2.9 cm left adrenal nodule cannot be definitively characterized on this study. Potentially a lipid poor adenoma, but metastatic disease not excluded. MRI of the abdomen with and without contrast may prove helpful to further evaluate. 2. No other findings to suggest metastatic disease in the abdomen/pelvis. 3. Left base atelectasis with small left pleural effusion.   08/27/2018 Imaging   MRI brain 08/27/18  IMPRESSION: 1. Motion degraded, incomplete examination. 2. Subcentimeter focus of diffusion signal abnormality in the left cerebellum. No enhancement is evident to strongly suggest a metastasis, and this may reflect an acute to subacute small vessel infarct. 3. No evidence of intracranial metastatic disease elsewhere. 4. Given motion artifact and incomplete postcontrast imaging, consider short-term follow-up MRI (possibly with sedation) to further evaluate the left cerebellar abnormality and provide a more thorough evaluation for metastatic disease.   09/02/2018 PET scan   IMPRESSION: 1. Hypermetabolic left hilar mass is identified encasing the left mainstem bronchus resulting in postobstructive atelectasis in the left upper lobe. 2. Hypermetabolic left mediastinal nodal metastasis. 3. Loculated left pleural effusion is identified with mild increased FDG uptake. Cannot rule out malignant pleural effusion. 4. No evidence for metastatic disease to the abdomen or pelvis or skeletal structures.     09/07/2018 -  Chemotherapy   Concurrent chemoRT with IV cisplatin on day 1 and etopside day 1-3 for every 3 weeks starting 8/11. Will start radiation with Dr. Lisbeth Renshaw with cycle 2.    09/14/2018 Procedure   Thoracentesis yielding 750 mL   09/28/2018 -  Radiation  Therapy   Concurrent chemoRT with Dr. Lisbeth Renshaw. Start with cycle 2 chemo on 09/28/18       CURRENT  THERAPY:  Concurrent chemoRT with IV cisplatin on day 1 and etopside day 1-3 for every 3 weeks starting 8/11. Started radiation with cycle 2 on 9/1.   INTERVAL HISTORY:  Marissa [name] is here for a follow up and treatment. She presents to the clinic alone. She notes needle pain in her head that will last for 5 seconds and then go away. That started 2 days ago and will happen twice a day. She notes her vision is adequate. She notes film in her eyes. She notes her nausea has not recurred. She notes her left boob is causing her 13/10 pain. She notes she still cleans and takes care of her home. She takes MS Contin BID and Oxycodone 2 times a day in between morphine. She takes Gab 300mg  in the morning and 600mg  at night. She needs a refill. She denies fever, chills, but does have dry cough mildly. She notes no GI issues except mild bowel cramps. She notes she feels thirsty but still drinks juice as much as possible. She notes she has not heard from Dr. Kathaleen [facility].     REVIEW OF SYSTEMS:   Constitutional: Denies fevers, chills or abnormal weight loss (+) Intermittent Needle like head pain  Eyes: Denies blurriness of vision Ears, nose, mouth, throat, and face: Denies mucositis or sore throat Respiratory: Denies cough, dyspnea or wheezes Cardiovascular: Denies palpitation, chest discomfort or lower extremity swelling Gastrointestinal:  Denies nausea, heartburn or change in bowel habits Skin: Denies abnormal skin rashes MSK: (+) Left shoulder pain and left chest pain  Lymphatics: Denies new lymphadenopathy or easy bruising Neurological:Denies numbness, tingling or new weaknesses Behavioral/Psych: Mood is stable, no new changes  All other systems were reviewed with the patient and are negative.  MEDICAL HISTORY:  Past Medical History:  Diagnosis Date  . Asthma   . Chronic headaches   . COPD (chronic obstructive pulmonary disease) ([place])   . Depression   . Heart murmur   . Small cell lung cancer  ([place])     SURGICAL HISTORY: Past Surgical History:  Procedure Laterality Date  . BRONCHIAL BIOPSY  08/24/2018   Procedure: BRONCHIAL BIOPSIES;  Surgeon: Margaretha Seeds, MD;  Location: [facility];  Service: Cardiopulmonary;;  . BRONCHIAL BRUSHINGS  08/24/2018   Procedure: BRONCHIAL BRUSHINGS;  Surgeon: Margaretha Seeds, MD;  Location: [facility];  Service: Cardiopulmonary;;  . BRONCHIAL NEEDLE ASPIRATION BIOPSY  08/24/2018   Procedure: BRONCHIAL NEEDLE ASPIRATION BIOPSIES;  Surgeon: Margaretha Seeds, MD;  Location: [facility];  Service: Cardiopulmonary;;  . ENDOBRONCHIAL ULTRASOUND N/A 08/24/2018   Procedure: ENDOBRONCHIAL ULTRASOUND;  Surgeon: Margaretha Seeds, MD;  Location: [facility];  Service: Cardiopulmonary;  Laterality: N/A;  . IR THORACENTESIS ASP PLEURAL SPACE W/IMG GUIDE  09/14/2018  . TUBAL LIGATION    . VIDEO BRONCHOSCOPY  08/24/2018   Procedure: VIDEO BRONCHOSCOPY;  Surgeon: Margaretha Seeds, MD;  Location: [facility];  Service: Cardiopulmonary;;    I have reviewed the social history and family history with the patient and they are unchanged from previous note.  ALLERGIES:  is allergic to penicillins.  MEDICATIONS:  Current Outpatient Medications  Medication Sig Dispense Refill  . albuterol (VENTOLIN HFA) 108 (90 Base) MCG/ACT inhaler Inhale 2 puffs into the lungs every 6 (six) hours as needed for wheezing or shortness of breath. 6.7 g 3  . baclofen (LIORESAL) 10  MG tablet Take 1 tablet (10 mg total) by mouth 3 (three) times daily as needed for muscle spasms. 30 each 0  . budesonide-formoterol (SYMBICORT) 160-4.5 MCG/ACT inhaler Inhale 2 puffs into the lungs 2 (two) times daily. 1 Inhaler 0  . gabapentin (NEURONTIN) 300 MG capsule Take 1 capsule (300 mg total) by mouth 3 (three) times daily. 90 capsule 0  . ipratropium-albuterol (DUONEB) 0.5-2.5 (3) MG/3ML SOLN Take 3 mLs by nebulization every 6 (six) hours as needed. (Patient taking differently: Take 3 mLs by  nebulization every 6 (six) hours as needed (wheezing/shortness  of breath). ) 360 mL 0  . loratadine (CLARITIN) 10 MG tablet Take 10 mg by mouth daily as needed for allergies.    Marland Kitchen morphine (MS CONTIN) 15 MG 12 hr tablet Take 1 tablet (15 mg total) by mouth every 12 (twelve) hours. 60 tablet 0  . nicotine polacrilex (NICORETTE) 4 MG gum Take 4 mg by mouth as needed for smoking cessation.    Marland Kitchen nystatin (MYCOSTATIN/NYSTOP) powder Apply topically 4 (four) times daily. 15 g 1  . ondansetron (ZOFRAN) 8 MG tablet Take 1 tablet (8 mg total) by mouth 2 (two) times daily as needed. Start on the third day after cisplatin chemotherapy. (Patient taking differently: Take 8 mg by mouth 2 (two) times daily as needed for nausea or vomiting. Start on the third day after cisplatin chemotherapy.) 30 tablet 1  . oxyCODONE (OXY IR/ROXICODONE) 5 MG immediate release tablet Take 1-2 tablets (5-10 mg total) by mouth every 4 (four) hours as needed for severe pain. 60 tablet 0  . PRESCRIPTION MEDICATION Take 1 tablet by mouth as needed (anxiety/sleep).    . prochlorperazine (COMPAZINE) 10 MG tablet Take 1 tablet (10 mg total) by mouth every 6 (six) hours as needed (Nausea or vomiting). 30 tablet 1  . tiotropium (SPIRIVA) 18 MCG inhalation capsule Place 1 capsule (18 mcg total) into inhaler and inhale daily. (Patient not taking: Reported on 09/28/2018) 30 capsule 6  . topiramate (TOPAMAX) 50 MG tablet Take 0.5-1 tablets (25-50 mg total) by mouth daily. 60 tablet 2  . traZODone (DESYREL) 100 MG tablet Take 1 tablet (100 mg total) by mouth at bedtime. Patient will pick up scripts today. 90 tablet 2   No current facility-administered medications for this visit.    Facility-Administered Medications Ordered in Other Visits  Medication Dose Route Frequency Provider Last Rate Last Dose  . heparin lock flush 100 unit/mL  500 Units Intracatheter Once PRN [name], MD      . sodium chloride flush (NS) 0.9 % injection 10 mL  10 mL  Intracatheter PRN [name], MD        PHYSICAL EXAMINATION: ECOG PERFORMANCE STATUS: 2 - Symptomatic, <50% confined to bed  Vitals with BMI 10/18/2018  Height   Weight 172 lbs 8 oz  BMI 35.57  Systolic 322  Diastolic 61  Pulse 93    GENERAL:alert, no distress and comfortable SKIN: skin color, texture, turgor are normal, no rashes or significant lesions EYES: normal, Conjunctiva are pink and non-injected, sclera clear  NECK: supple, thyroid normal size, non-tender, without nodularity LYMPH:  no palpable lymphadenopathy in the cervical, axillary  LUNGS: clear to auscultation and percussion with normal breathing effort HEART: regular rate & rhythm and no murmurs and no lower extremity edema ABDOMEN:abdomen soft, non-tender and normal bowel sounds Musculoskeletal:no cyanosis of digits and no clubbing  NEURO: alert & oriented x 3 with fluent speech, no focal motor/sensory deficits  LABORATORY  DATA:  I have reviewed the data as listed CBC Latest Ref Rng & Units 10/18/2018 10/08/2018 09/28/2018  WBC 4.0 - 10.5 K/uL 2.2(L) 5.6 10.5  Hemoglobin 12.0 - 15.0 g/dL 11.9(L) 11.0(L) 11.8(L)  Hematocrit 36.0 - 46.0 % 39.2 36.4 40.7  Platelets 150 - 400 K/uL 314 241 540(H)     CMP Latest Ref Rng & Units 10/18/2018 10/08/2018 09/28/2018  Glucose 70 - 99 mg/dL 112(H) 100(H) 112(H)  BUN 6 - 20 mg/dL 11 19 10   Creatinine 0.44 - 1.00 mg/dL 0.68 0.83 0.83  Sodium 135 - 145 mmol/L 142 142 142  Potassium 3.5 - 5.1 mmol/L 3.1(L) 3.6 3.9  Chloride 98 - 111 mmol/L 106 108 108  CO2 22 - 32 mmol/L 27 27 25   Calcium 8.9 - 10.3 mg/dL 9.6 8.6(L) 9.8  Total Protein 6.5 - 8.1 g/dL 7.6 7.4 7.7  Total Bilirubin 0.3 - 1.2 mg/dL <0.2(L) <0.2(L) <0.2(L)  Alkaline Phos 38 - 126 U/L 119 105 163(H)  AST 15 - 41 U/L 10(L) 14(L) 34  ALT 0 - 44 U/L 12 13 39      RADIOGRAPHIC STUDIES: I have personally reviewed the radiological images as listed and agreed with the findings in the report. No results found.    ASSESSMENT & PLAN:  Marissa [name] is a 53 y.o. female with   1.Small cell lung cancer, stage III, limited stage  -Recently diagnosed in 07/2018. Imaging showed 66mm nodule of left lung with malignant appearing left mediastinal and hilar lymphadenopathy. Her Bronchoscopy biopsy confirmed mass to be small cell lung cancer while her left hilar andmediastinal lymph node biopsy were negative for malignant cells.  -CT AP and MRI brain negative for distant metastasis.  -I personally reviewed her PET scan from 09/02/18 which shows Hypermetabolic left mediastinal nodal metastasis, but no distant metastasis.  -Given her locally advanced disease (stage III cancer). Standard treatment includes concurrent chemoRT. On 09/07/18 I started on with IV chemo cisplatin on day 1 and etopside day 1-3 for every 3 weeks. Will start radiation with Dr. Lisbeth Renshaw with cycle 2 chemo on 09/28/18.  -She has been tolerating treatment moderately well with manageable nausea and fatigue, which have improved this week. She recently has been having intermittent needle like head pain, no other neurological changes. Will monitor.  -Labs reviewed, CBC and CMP WNL except WBC 2.2, Hg 11.9, ANC 1.1, K 3.1, BG 112, AST 10. Overall adequate to proceed with C3D1 Cisplatin and Etopside today with reduce dose. I encouraged her to watch for infections given neutropenia. I will start her on oral potassium .  -I offered her the flu shot in the next few weeks when her blood counts improve. She is agreeable.  -F/u next week    2. Smoking Cessation, COPD, H/o acute left tension pneumothorax, resolved -She has a h/o heavy smoking.  -She has COPD. She had recent execration with acute left tension pneumothorax in 07/2018.  -She underwent bronchoscopy and EBUS biopsy on 7/28 and wasadmitted after procedure for pneumothorax. Now resolved.  -She was previously on continuous canula oxygen, her breathing has improved.  3. Severe LUQ and left flank pain  -She had  chest tube placed on left upper back S/p removal she has had severe radiating LUQ pain.  -Given this could be nerve damage causing her pain, on 09/03/18 I called in gabapentin 300mg . She can titrate up to TID if tolerable. -She continues MS Contin BID and Oxycodone 10mg . I discussed with long term use she will become  dependent at this frequency, she understands. I previously referred her to Dr. Donell Sievert office for pain management.  -She mainly has pain in LUQ and left shoulder. She takes MS Contin 15mg  BID and Oxycodone 5mg  twice a day in between long acting. She Still has pain in left flank for which she takes Gabapentin 300mg  in the AM and 600mg  in the PM. Refilled Gabapentin today (10/18/18) -She still has not been contacted by Dr. Kathaleen Grinder [facility] for appointment, I sent referral again today   4. Iron deficient anemia -Developed during recent hospitalization.  -Treated with IV Iron on 7/3 and 7/31. Anemia has improved  -Will monitor.   5. Nausea, vomiting, poor appetite, secondary to chemo  -I previously reviewed antiemetics, she will continue Compazine 3-4 times a day before meals, and use Zofran as needed in between -I encouraged her to start nutritional supplement, such as Ensure boost -She will follow-up with our dietitian -will give IV Fluids as needed. Nausea has improved and currently well managed on antiemetics.   8. Hypokalemia  -K at 3.1 (10/18/18) -I will call in Oral potassium to take twice daily for 3 days then continue once daily.    PLAN:  -I refilled Gabapentin today  -I called in oral K today.  -Labs reviewed and adequate to proceed with C3D1 Cisplatin and etopside today at reduced dose due to her mild neutropenia  -Continue Radiation  -Lab and f/u in 1 and 2 weeks    No problem-specific Assessment & Plan notes found for this encounter.   No orders of the defined types were placed in this encounter.  All questions were answered. The patient knows to call the  clinic with any problems, questions or concerns. No barriers to learning was detected. I spent 20 minutes counseling the patient face to face. The total time spent in the appointment was 25 minutes and more than 50% was on counseling and review of test results     [name], MD 10/18/2018   I, Joslyn Devon, am acting as scribe for [name], MD.   I have reviewed the above documentation for accuracy and completeness, and I agree with the above.

## 2018-10-15 ENCOUNTER — Ambulatory Visit
Admission: RE | Admit: 2018-10-15 | Discharge: 2018-10-15 | Disposition: A | Discharge status: Home / Self Care | Payer: Medicaid Other | Source: Ambulatory Visit | Attending: Radiation Oncology | Admitting: Radiation Oncology

## 2018-10-15 ENCOUNTER — Other Ambulatory Visit: Payer: Self-pay

## 2018-10-15 DIAGNOSIS — Z510 Encounter for antineoplastic radiation therapy: Secondary | ICD-10-CM | POA: Diagnosis not present

## 2018-10-18 ENCOUNTER — Inpatient Hospital Stay: Payer: Medicaid Other

## 2018-10-18 ENCOUNTER — Ambulatory Visit
Admission: RE | Admit: 2018-10-18 | Discharge: 2018-10-18 | Disposition: A | Discharge status: Home / Self Care | Payer: Medicaid Other | Source: Ambulatory Visit | Attending: Radiation Oncology | Admitting: Radiation Oncology

## 2018-10-18 ENCOUNTER — Inpatient Hospital Stay (HOSPITAL_BASED_OUTPATIENT_CLINIC_OR_DEPARTMENT_OTHER): Payer: Medicaid Other | Admitting: Hematology

## 2018-10-18 ENCOUNTER — Encounter: Payer: Self-pay | Admitting: Hematology

## 2018-10-18 ENCOUNTER — Other Ambulatory Visit: Payer: Self-pay

## 2018-10-18 VITALS — BP 161/61 | HR 93 | Temp 98.5°F | Resp 17 | Wt 172.5 lb

## 2018-10-18 DIAGNOSIS — C3490 Malignant neoplasm of unspecified part of unspecified bronchus or lung: Secondary | ICD-10-CM

## 2018-10-18 DIAGNOSIS — C3412 Malignant neoplasm of upper lobe, left bronchus or lung: Secondary | ICD-10-CM

## 2018-10-18 DIAGNOSIS — Z5111 Encounter for antineoplastic chemotherapy: Secondary | ICD-10-CM | POA: Diagnosis not present

## 2018-10-18 DIAGNOSIS — C3492 Malignant neoplasm of unspecified part of left bronchus or lung: Secondary | ICD-10-CM

## 2018-10-18 DIAGNOSIS — Z510 Encounter for antineoplastic radiation therapy: Secondary | ICD-10-CM | POA: Diagnosis not present

## 2018-10-18 LAB — CBC WITH DIFFERENTIAL (CANCER CENTER ONLY)
Abs Immature Granulocytes: 0 10*3/uL (ref 0.00–0.07)
Basophils Absolute: 0 10*3/uL (ref 0.0–0.1)
Basophils Relative: 1 %
Eosinophils Absolute: 0.1 10*3/uL (ref 0.0–0.5)
Eosinophils Relative: 3 %
HCT: 39.2 % (ref 36.0–46.0)
Hemoglobin: 11.9 g/dL — ABNORMAL LOW (ref 12.0–15.0)
Immature Granulocytes: 0 %
Lymphocytes Relative: 31 %
Lymphs Abs: 0.7 10*3/uL (ref 0.7–4.0)
MCH: 27.5 pg (ref 26.0–34.0)
MCHC: 30.4 g/dL (ref 30.0–36.0)
MCV: 90.7 fL (ref 80.0–100.0)
Monocytes Absolute: 0.3 10*3/uL (ref 0.1–1.0)
Monocytes Relative: 13 %
Neutro Abs: 1.1 10*3/uL — ABNORMAL LOW (ref 1.7–7.7)
Neutrophils Relative %: 52 %
Platelet Count: 314 10*3/uL (ref 150–400)
RBC: 4.32 MIL/uL (ref 3.87–5.11)
RDW: 22.3 % — ABNORMAL HIGH (ref 11.5–15.5)
WBC Count: 2.2 10*3/uL — ABNORMAL LOW (ref 4.0–10.5)
nRBC: 0 % (ref 0.0–0.2)

## 2018-10-18 LAB — CMP (CANCER CENTER ONLY)
ALT: 12 U/L (ref 0–44)
AST: 10 U/L — ABNORMAL LOW (ref 15–41)
Albumin: 4.1 g/dL (ref 3.5–5.0)
Alkaline Phosphatase: 119 U/L (ref 38–126)
Anion gap: 9 (ref 5–15)
BUN: 11 mg/dL (ref 6–20)
CO2: 27 mmol/L (ref 22–32)
Calcium: 9.6 mg/dL (ref 8.9–10.3)
Chloride: 106 mmol/L (ref 98–111)
Creatinine: 0.68 mg/dL (ref 0.44–1.00)
GFR, Est AFR Am: >60 mL/min (ref >60)
GFR, Estimated: >60 mL/min (ref >60)
Glucose, Bld: 112 mg/dL — ABNORMAL HIGH (ref 70–99)
Potassium: 3.1 mmol/L — ABNORMAL LOW (ref 3.5–5.1)
Sodium: 142 mmol/L (ref 135–145)
Total Bilirubin: <0.2 mg/dL — ABNORMAL LOW (ref 0.3–1.2)
Total Protein: 7.6 g/dL (ref 6.5–8.1)

## 2018-10-18 MED ORDER — SODIUM CHLORIDE 0.9 % IV SOLN
70.0000 mg/m2 | Freq: Once | INTRAVENOUS | Status: AC
  Administered 2018-10-18: 133 mg via INTRAVENOUS
  Filled 2018-10-18: qty 133

## 2018-10-18 MED ORDER — PALONOSETRON HCL INJECTION 0.25 MG/5ML
0.2500 mg | Freq: Once | INTRAVENOUS | Status: AC
  Administered 2018-10-18: 0.25 mg via INTRAVENOUS

## 2018-10-18 MED ORDER — PALONOSETRON HCL INJECTION 0.25 MG/5ML
INTRAVENOUS | Status: AC
  Filled 2018-10-18: qty 5

## 2018-10-18 MED ORDER — POTASSIUM CHLORIDE CRYS ER 20 MEQ PO TBCR: 20.0000 meq | EXTENDED_RELEASE_TABLET | Freq: Every day | ORAL | 1 refills | Status: DC

## 2018-10-18 MED ORDER — POTASSIUM CHLORIDE 2 MEQ/ML IV SOLN
Freq: Once | INTRAVENOUS | Status: AC
  Administered 2018-10-18: 12:00:00 via INTRAVENOUS
  Filled 2018-10-18: qty 10

## 2018-10-18 MED ORDER — GABAPENTIN 300 MG PO CAPS: ORAL_CAPSULE | ORAL | 0 refills | Status: DC

## 2018-10-18 MED ORDER — HEPARIN SOD (PORK) LOCK FLUSH 100 UNIT/ML IV SOLN
500.0000 [IU] | Freq: Once | INTRAVENOUS | Status: DC | PRN
  Filled 2018-10-18: qty 5

## 2018-10-18 MED ORDER — SODIUM CHLORIDE 0.9 % IV SOLN
90.0000 mg/m2 | Freq: Once | INTRAVENOUS | Status: AC
  Administered 2018-10-18: 170 mg via INTRAVENOUS
  Filled 2018-10-18: qty 8.5

## 2018-10-18 MED ORDER — SODIUM CHLORIDE 0.9% FLUSH
10.0000 mL | INTRAVENOUS | Status: DC | PRN
  Filled 2018-10-18: qty 10

## 2018-10-18 MED ORDER — SODIUM CHLORIDE 0.9 % IV SOLN
Freq: Once | INTRAVENOUS | Status: AC
  Administered 2018-10-18: 11:00:00 via INTRAVENOUS
  Filled 2018-10-18: qty 250

## 2018-10-18 MED ORDER — SODIUM CHLORIDE 0.9 % IV SOLN
Freq: Once | INTRAVENOUS | Status: AC
  Administered 2018-10-18: 14:00:00 via INTRAVENOUS
  Filled 2018-10-18: qty 5

## 2018-10-18 NOTE — Progress Notes (Signed)
Okay to treat with today's labs per Dr. Burr Medico and will dose reduce.

## 2018-10-18 NOTE — Progress Notes (Signed)
Per Dr. Burr Medico: OK to run post hydration IVF concurrently with Cisplatin. Infusion RNs updated

## 2018-10-18 NOTE — Patient Instructions (Signed)
[facility] Discharge Instructions for Patients Receiving Chemotherapy  Today you received the following chemotherapy agents: Etoposide, Cisplatin  To help prevent nausea and vomiting after your treatment, we encourage you to take your nausea medication as directed.   If you develop nausea and vomiting that is not controlled by your nausea medication, call the clinic.   BELOW ARE SYMPTOMS THAT SHOULD BE REPORTED IMMEDIATELY:  *FEVER GREATER THAN 100.5 F  *CHILLS WITH OR WITHOUT FEVER  NAUSEA AND VOMITING THAT IS NOT CONTROLLED WITH YOUR NAUSEA MEDICATION  *UNUSUAL SHORTNESS OF BREATH  *UNUSUAL BRUISING OR BLEEDING  TENDERNESS IN MOUTH AND THROAT WITH OR WITHOUT PRESENCE OF ULCERS  *URINARY PROBLEMS  *BOWEL PROBLEMS  UNUSUAL RASH Items with * indicate a potential emergency and should be followed up as soon as possible.  Feel free to call the clinic should you have any questions or concerns. The clinic phone number is [phone].  Please show the [place] at check-in to the Emergency Department and triage nurse.

## 2018-10-19 ENCOUNTER — Other Ambulatory Visit: Payer: Self-pay

## 2018-10-19 ENCOUNTER — Inpatient Hospital Stay: Payer: Medicaid Other

## 2018-10-19 ENCOUNTER — Ambulatory Visit
Admission: RE | Admit: 2018-10-19 | Discharge: 2018-10-19 | Disposition: A | Discharge status: Home / Self Care | Payer: Medicaid Other | Source: Ambulatory Visit | Attending: Radiation Oncology | Admitting: Radiation Oncology

## 2018-10-19 ENCOUNTER — Telehealth: Payer: Self-pay | Admitting: Nurse Practitioner

## 2018-10-19 VITALS — BP 140/70 | HR 73 | Temp 98.7°F | Resp 18

## 2018-10-19 DIAGNOSIS — C3490 Malignant neoplasm of unspecified part of unspecified bronchus or lung: Secondary | ICD-10-CM

## 2018-10-19 DIAGNOSIS — Z5111 Encounter for antineoplastic chemotherapy: Secondary | ICD-10-CM | POA: Diagnosis not present

## 2018-10-19 DIAGNOSIS — Z510 Encounter for antineoplastic radiation therapy: Secondary | ICD-10-CM | POA: Diagnosis not present

## 2018-10-19 DIAGNOSIS — C3412 Malignant neoplasm of upper lobe, left bronchus or lung: Secondary | ICD-10-CM

## 2018-10-19 MED ORDER — DEXAMETHASONE SODIUM PHOSPHATE 10 MG/ML IJ SOLN
INTRAMUSCULAR | Status: AC
  Filled 2018-10-19: qty 1

## 2018-10-19 MED ORDER — SODIUM CHLORIDE 0.9 % IV SOLN
90.0000 mg/m2 | Freq: Once | INTRAVENOUS | Status: AC
  Administered 2018-10-19: 170 mg via INTRAVENOUS
  Filled 2018-10-19: qty 8.5

## 2018-10-19 MED ORDER — SODIUM CHLORIDE 0.9 % IV SOLN
Freq: Once | INTRAVENOUS | Status: AC
  Administered 2018-10-19: 11:00:00 via INTRAVENOUS
  Filled 2018-10-19: qty 250

## 2018-10-19 MED ORDER — DEXAMETHASONE SODIUM PHOSPHATE 10 MG/ML IJ SOLN
10.0000 mg | Freq: Once | INTRAMUSCULAR | Status: AC
  Administered 2018-10-19: 10 mg via INTRAVENOUS

## 2018-10-19 MED FILL — POTASSIUM CHLORIDE CRYS ER: 20 | 27 days supply | Qty: 30 | Fill #0

## 2018-10-19 MED FILL — GABAPENTIN 300 MG CAPSULE: 300 | 30 days supply | Qty: 90 | Fill #0

## 2018-10-19 NOTE — Progress Notes (Signed)
Nutrition Follow-up:  Patient with small cell lung cancer followed by Dr. Burr Medico.  Patient receiving chemotherapy and radiation therapy.    Met with patient during infusion.  Patient reports that she does not eat well the week of chemotherapy.  Has had issues with nausea and vomiting.  Currently controlled.  Reports that she has been eating fruits (banana, oranges, grapes) recently.  Drinks 1 boost per day during the week of chemotherapy when appetite is down.  Does not want to drink them more often due to not wanting to get tired of them.      Medications: reviewed  Labs: reviewed  Anthropometrics:   Weight 172 lb 8 oz on 9/21 slight decrease from 174 lb on 9/1.     NUTRITION DIAGNOSIS: Inadequate oral intake stable   INTERVENTION:  Encouraged adding protein source with fruit.  We discussed options for patient to try.  Encouraged her to continue oral nutrition supplements.  Coupons given Patient has contact information    MONITORING, EVALUATION, GOAL: patient will consume adequate calories and protein to maintain lean muscle mass during treatment   NEXT VISIT: to be determined with treatment  Marissa [name] B. Zenia Resides, [place], [place] Registered Dietitian [phone] (pager)

## 2018-10-19 NOTE — Patient Instructions (Signed)
[facility] Discharge Instructions for Patients Receiving Chemotherapy  Today you received the following chemotherapy agents: Etoposide.  To help prevent nausea and vomiting after your treatment, we encourage you to take your nausea medication as directed.   If you develop nausea and vomiting that is not controlled by your nausea medication, call the clinic.   BELOW ARE SYMPTOMS THAT SHOULD BE REPORTED IMMEDIATELY:  *FEVER GREATER THAN 100.5 F  *CHILLS WITH OR WITHOUT FEVER  NAUSEA AND VOMITING THAT IS NOT CONTROLLED WITH YOUR NAUSEA MEDICATION  *UNUSUAL SHORTNESS OF BREATH  *UNUSUAL BRUISING OR BLEEDING  TENDERNESS IN MOUTH AND THROAT WITH OR WITHOUT PRESENCE OF ULCERS  *URINARY PROBLEMS  *BOWEL PROBLEMS  UNUSUAL RASH Items with * indicate a potential emergency and should be followed up as soon as possible.  Feel free to call the clinic should you have any questions or concerns. The clinic phone number is [phone].  Please show the [facility] at check-in to the Emergency Department and triage nurse.

## 2018-10-19 NOTE — Telephone Encounter (Signed)
Scheduled appt per 9/21 los.  Left a voice messgae of appt date and time.

## 2018-10-20 ENCOUNTER — Other Ambulatory Visit: Payer: Self-pay

## 2018-10-20 ENCOUNTER — Ambulatory Visit
Admission: RE | Admit: 2018-10-20 | Discharge: 2018-10-20 | Disposition: A | Discharge status: Home / Self Care | Payer: Medicaid Other | Source: Ambulatory Visit | Attending: Radiation Oncology | Admitting: Radiation Oncology

## 2018-10-20 ENCOUNTER — Inpatient Hospital Stay: Payer: Medicaid Other

## 2018-10-20 VITALS — BP 141/64 | HR 63 | Temp 98.0°F | Resp 18

## 2018-10-20 DIAGNOSIS — Z510 Encounter for antineoplastic radiation therapy: Secondary | ICD-10-CM | POA: Diagnosis not present

## 2018-10-20 DIAGNOSIS — C3412 Malignant neoplasm of upper lobe, left bronchus or lung: Secondary | ICD-10-CM

## 2018-10-20 DIAGNOSIS — Z5111 Encounter for antineoplastic chemotherapy: Secondary | ICD-10-CM | POA: Diagnosis not present

## 2018-10-20 MED ORDER — DEXAMETHASONE SODIUM PHOSPHATE 10 MG/ML IJ SOLN
INTRAMUSCULAR | Status: AC
  Filled 2018-10-20: qty 1

## 2018-10-20 MED ORDER — DEXAMETHASONE SODIUM PHOSPHATE 10 MG/ML IJ SOLN
10.0000 mg | Freq: Once | INTRAMUSCULAR | Status: AC
  Administered 2018-10-20: 10 mg via INTRAVENOUS

## 2018-10-20 MED ORDER — SODIUM CHLORIDE 0.9 % IV SOLN
Freq: Once | INTRAVENOUS | Status: AC
  Administered 2018-10-20: 12:00:00 via INTRAVENOUS
  Filled 2018-10-20: qty 250

## 2018-10-20 MED ORDER — SODIUM CHLORIDE 0.9 % IV SOLN
90.0000 mg/m2 | Freq: Once | INTRAVENOUS | Status: AC
  Administered 2018-10-20: 170 mg via INTRAVENOUS
  Filled 2018-10-20: qty 8.5

## 2018-10-20 NOTE — Patient Instructions (Signed)
[facility] Discharge Instructions for Patients Receiving Chemotherapy  Today you received the following chemotherapy agents: Etoposide.  To help prevent nausea and vomiting after your treatment, we encourage you to take your nausea medication as directed.   If you develop nausea and vomiting that is not controlled by your nausea medication, call the clinic.   BELOW ARE SYMPTOMS THAT SHOULD BE REPORTED IMMEDIATELY:  *FEVER GREATER THAN 100.5 F  *CHILLS WITH OR WITHOUT FEVER  NAUSEA AND VOMITING THAT IS NOT CONTROLLED WITH YOUR NAUSEA MEDICATION  *UNUSUAL SHORTNESS OF BREATH  *UNUSUAL BRUISING OR BLEEDING  TENDERNESS IN MOUTH AND THROAT WITH OR WITHOUT PRESENCE OF ULCERS  *URINARY PROBLEMS  *BOWEL PROBLEMS  UNUSUAL RASH Items with * indicate a potential emergency and should be followed up as soon as possible.  Feel free to call the clinic should you have any questions or concerns. The clinic phone number is [phone].  Please show the [facility] at check-in to the Emergency Department and triage nurse.

## 2018-10-21 ENCOUNTER — Ambulatory Visit
Admission: RE | Admit: 2018-10-21 | Discharge: 2018-10-21 | Disposition: A | Discharge status: Home / Self Care | Payer: Medicaid Other | Source: Ambulatory Visit | Attending: Radiation Oncology | Admitting: Radiation Oncology

## 2018-10-21 ENCOUNTER — Other Ambulatory Visit: Payer: Self-pay

## 2018-10-21 DIAGNOSIS — Z510 Encounter for antineoplastic radiation therapy: Secondary | ICD-10-CM | POA: Diagnosis not present

## 2018-10-22 ENCOUNTER — Ambulatory Visit
Admission: RE | Admit: 2018-10-22 | Discharge: 2018-10-22 | Disposition: A | Discharge status: Home / Self Care | Payer: Medicaid Other | Source: Ambulatory Visit | Attending: Radiation Oncology | Admitting: Radiation Oncology

## 2018-10-22 ENCOUNTER — Other Ambulatory Visit: Payer: Self-pay | Admitting: Radiation Oncology

## 2018-10-22 ENCOUNTER — Other Ambulatory Visit: Payer: Self-pay

## 2018-10-22 ENCOUNTER — Ambulatory Visit: Payer: Self-pay

## 2018-10-22 DIAGNOSIS — Z510 Encounter for antineoplastic radiation therapy: Secondary | ICD-10-CM | POA: Diagnosis not present

## 2018-10-22 MED ORDER — SUCRALFATE 1 G PO TABS: 1.0000 g | ORAL_TABLET | Freq: Four times a day (QID) | ORAL | 2 refills | Status: DC

## 2018-10-22 MED FILL — SUCRALFATE 1 GM TABLET: 1 | 30 days supply | Qty: 120 | Fill #0

## 2018-10-25 ENCOUNTER — Ambulatory Visit: Payer: Medicaid Other

## 2018-10-25 DIAGNOSIS — Z510 Encounter for antineoplastic radiation therapy: Secondary | ICD-10-CM | POA: Diagnosis not present

## 2018-10-26 ENCOUNTER — Other Ambulatory Visit: Payer: Self-pay

## 2018-10-26 ENCOUNTER — Ambulatory Visit: Payer: Medicaid Other

## 2018-10-26 ENCOUNTER — Ambulatory Visit: Payer: Self-pay | Admitting: Nurse Practitioner

## 2018-10-26 ENCOUNTER — Inpatient Hospital Stay: Payer: Medicaid Other

## 2018-10-26 ENCOUNTER — Inpatient Hospital Stay: Payer: Medicaid Other | Admitting: Nurse Practitioner

## 2018-10-26 ENCOUNTER — Telehealth: Payer: Self-pay

## 2018-10-26 NOTE — Telephone Encounter (Signed)
Patient left a voicemail to say she had a death in the family and would not be able to make her appointments today. Lacie Burton-NP updated and informed that it was fine for patient to miss today (it was just a symptom check). She did say that if patient felt poorly this week to have her call our office and try to be seen by Alfredia Client or Cassie Heilingoepter-PA. Radiation Oncology updated on patient's situation.  Called patient back and left a voicemail with the above information. Expressed our condolences for her loss and encouraged her to call us with any issues concerns before her next appointments on 11/01/18

## 2018-10-27 ENCOUNTER — Ambulatory Visit: Payer: Medicaid Other

## 2018-10-27 ENCOUNTER — Other Ambulatory Visit: Payer: Self-pay | Admitting: Pulmonary Disease

## 2018-10-28 ENCOUNTER — Ambulatory Visit: Payer: Medicaid Other

## 2018-10-29 ENCOUNTER — Ambulatory Visit: Payer: Medicaid Other

## 2018-10-31 NOTE — Progress Notes (Signed)
[facility]   [phone] [phone]) [phone]   Clinic Follow up Note   Patient Care Team: Gildardo Pounds, NP as PCP - General (Nurse Practitioner) 11/01/2018  CHIEF COMPLAINT: F/u SCLC  SUMMARY OF ONCOLOGIC HISTORY: Oncology History Overview Note  Cancer Staging Small cell lung cancer, left upper lobe ([place]) Staging form: Lung, AJCC 8th Edition - Clinical stage from 08/24/2018: Stage IIIA (cT1a, cN2, cM0) - Signed by [name], MD on 10/17/2018    Small cell lung cancer, left upper lobe ([place])  07/29/2018 Imaging   CT Angio Chest 07/29/18  IMPRESSION: 1. Malignant appearing left mediastinal and hilar lymphadenopathy narrowing the airways at the left hilum. In the left lung only a subtle 9 mm spiculated nodule is identified in the lingula. Consider small cell carcinoma. Bronchoscopy or mediastinal node sampling might be most valuable for diagnosis. 2. Indeterminate although low-density (which typically which indicates a benign adenoma) left adrenal nodule. 3.  Negative for acute pulmonary embolus.   08/24/2018 Definitive Surgery   Bronchoscopy and EBUS biopsy on 08/24/18 by Dr. Loanne Drilling   08/24/2018 Initial Biopsy   Diagnosis 08/24/18 FINE NEEDLE ASPIRATION, ENDOSCOPIC, (EBUS) STATION # 7 LYMPH NODE(SPECIMEN 1 OF 4 COLLECTED 08/24/18): NO MALIGNANT CELLS IDENTIFIED.  FINE NEEDLE ASPIRATION, ENDOSCOPIC, (EBUS) LEFT HILAR LYMPH NODE(SPECIMEN 2 OF 4 COLLECTED 08/24/18): NO MALIGNANT CELLS IDENTIFIED.  TRANSBRONCHIAL NEEDLE ASPIRATION, WANG, (SPECIMEN 3 OF 4 COLLECTED 08/24/18): NO MALIGNANT CELLS IDENTIFIED.  BRONCHIAL BRUSHING, LUL (SPECIMEN 4 OF 4 COLLECTED 08/24/18): MALIGNANT CELLS PRESENT, MOST CONSISTENT WITH SMALL CELL CARCINOMA. SEE COMMENT.   08/24/2018 Cancer Staging   Staging form: Lung, AJCC 8th Edition - Clinical stage from 08/24/2018: Stage IIIA (cT1a, cN2, cM0) - Signed by [name], MD on 10/17/2018   08/26/2018 Initial Diagnosis   Small cell  lung cancer, left ([place])   08/26/2018 Imaging   CT AP W Contrast 08/26/18  IMPRESSION: 1. 2.9 cm left adrenal nodule cannot be definitively characterized on this study. Potentially a lipid poor adenoma, but metastatic disease not excluded. MRI of the abdomen with and without contrast may prove helpful to further evaluate. 2. No other findings to suggest metastatic disease in the abdomen/pelvis. 3. Left base atelectasis with small left pleural effusion.   08/27/2018 Imaging   MRI brain 08/27/18  IMPRESSION: 1. Motion degraded, incomplete examination. 2. Subcentimeter focus of diffusion signal abnormality in the left cerebellum. No enhancement is evident to strongly suggest a metastasis, and this may reflect an acute to subacute small vessel infarct. 3. No evidence of intracranial metastatic disease elsewhere. 4. Given motion artifact and incomplete postcontrast imaging, consider short-term follow-up MRI (possibly with sedation) to further evaluate the left cerebellar abnormality and provide a more thorough evaluation for metastatic disease.   09/02/2018 PET scan   IMPRESSION: 1. Hypermetabolic left hilar mass is identified encasing the left mainstem bronchus resulting in postobstructive atelectasis in the left upper lobe. 2. Hypermetabolic left mediastinal nodal metastasis. 3. Loculated left pleural effusion is identified with mild increased FDG uptake. Cannot rule out malignant pleural effusion. 4. No evidence for metastatic disease to the abdomen or pelvis or skeletal structures.     09/07/2018 -  Chemotherapy   Concurrent chemoRT with IV cisplatin on day 1 and etopside day 1-3 for every 3 weeks starting 8/11. Will start radiation with Dr. Lisbeth Renshaw with cycle 2.    09/14/2018 Procedure   Thoracentesis yielding 750 mL   09/28/2018 -  Radiation Therapy   Concurrent chemoRT with Dr. Lisbeth Renshaw. Start  with cycle 2 chemo on 09/28/18      CURRENT THERAPY: Concurrent chemoRT with IV cisplatin  on day 1 and etopside day 1-3 for every 3 weeks starting 8/11. Started radiation with cycle 2 on 9/1.   INTERVAL HISTORY: Ms. Harkless returns for f/u as scheduled. She completed cycle 3 chemo on 9/21 and continues RT. She had to cancel toxicity check f/u on 9/29 and radiation last week because her brother passed away from OD. She helped out with the arrangements and over used her arms. Has more left shoulder and upper breast/side pain. She had to take up to 4 oxycodone at once. Pain is usually adequately controlled on MS contin BID and 10 mg oxy q4. She needs a refill. She has intermittent sore throat on swallowing, tried carafate which made her vomit bc of large pill size. Also has sporadic nausea related to smell aversions. Throat pain resolved. Eating and drinking well. She had constipation last week that resolved with laxative and stool softener. Denies fever, chills, chest pain, dyspnea. Has mild intermittent dry cough, unchanged. She noticed an itching rash to base of her head and upper back, and a few spots on her arms. Only new medication is carafate. No new detergent or soap. No environmental exposures.    MEDICAL HISTORY:  Past Medical History:  Diagnosis Date  . Asthma   . Chronic headaches   . COPD (chronic obstructive pulmonary disease) ([place])   . Depression   . Heart murmur   . Small cell lung cancer ([place])     SURGICAL HISTORY: Past Surgical History:  Procedure Laterality Date  . BRONCHIAL BIOPSY  08/24/2018   Procedure: BRONCHIAL BIOPSIES;  Surgeon: Margaretha Seeds, MD;  Location: [facility];  Service: Cardiopulmonary;;  . BRONCHIAL BRUSHINGS  08/24/2018   Procedure: BRONCHIAL BRUSHINGS;  Surgeon: Margaretha Seeds, MD;  Location: [facility];  Service: Cardiopulmonary;;  . BRONCHIAL NEEDLE ASPIRATION BIOPSY  08/24/2018   Procedure: BRONCHIAL NEEDLE ASPIRATION BIOPSIES;  Surgeon: Margaretha Seeds, MD;  Location: [facility];  Service: Cardiopulmonary;;  . ENDOBRONCHIAL  ULTRASOUND N/A 08/24/2018   Procedure: ENDOBRONCHIAL ULTRASOUND;  Surgeon: Margaretha Seeds, MD;  Location: [facility];  Service: Cardiopulmonary;  Laterality: N/A;  . IR THORACENTESIS ASP PLEURAL SPACE W/IMG GUIDE  09/14/2018  . TUBAL LIGATION    . VIDEO BRONCHOSCOPY  08/24/2018   Procedure: VIDEO BRONCHOSCOPY;  Surgeon: Margaretha Seeds, MD;  Location: [facility];  Service: Cardiopulmonary;;    I have reviewed the social history and family history with the patient and they are unchanged from previous note.  ALLERGIES:  is allergic to penicillins.  MEDICATIONS:  Current Outpatient Medications  Medication Sig Dispense Refill  . baclofen (LIORESAL) 10 MG tablet Take 1 tablet (10 mg total) by mouth 3 (three) times daily as needed for muscle spasms. 30 each 0  . gabapentin (NEURONTIN) 300 MG capsule Take 1 tab in morning and 2 tabs at bedtime 90 capsule 0  . morphine (MS CONTIN) 15 MG 12 hr tablet Take 1 tablet (15 mg total) by mouth every 12 (twelve) hours. 60 tablet 0  . nystatin (MYCOSTATIN/NYSTOP) powder Apply topically 4 (four) times daily. 15 g 1  . ondansetron (ZOFRAN) 8 MG tablet Take 1 tablet (8 mg total) by mouth 2 (two) times daily as needed. Start on the third day after cisplatin chemotherapy. (Patient taking differently: Take 8 mg by mouth 2 (two) times daily as needed for nausea or vomiting. Start on the third day after  cisplatin chemotherapy.) 30 tablet 1  . oxyCODONE (OXY IR/ROXICODONE) 5 MG immediate release tablet Take 1-2 tablets (5-10 mg total) by mouth every 4 (four) hours as needed for severe pain. 60 tablet 0  . potassium chloride SA (K-DUR) 20 MEQ tablet Take 1 tablet (20 mEq total) by mouth daily. 30 tablet 1  . PRESCRIPTION MEDICATION Take 1 tablet by mouth as needed (anxiety/sleep).    . prochlorperazine (COMPAZINE) 10 MG tablet Take 1 tablet (10 mg total) by mouth every 6 (six) hours as needed (Nausea or vomiting). 30 tablet 1  . traZODone (DESYREL) 100 MG tablet  Take 1 tablet (100 mg total) by mouth at bedtime. Patient will pick up scripts today. 90 tablet 2  . albuterol (VENTOLIN HFA) 108 (90 Base) MCG/ACT inhaler Inhale 2 puffs into the lungs every 6 (six) hours as needed for wheezing or shortness of breath. (Patient not taking: Reported on 11/01/2018) 6.7 g 3  . budesonide-formoterol (SYMBICORT) 160-4.5 MCG/ACT inhaler Inhale 2 puffs into the lungs 2 (two) times daily. (Patient not taking: Reported on 11/01/2018) 1 Inhaler 0  . ipratropium-albuterol (DUONEB) 0.5-2.5 (3) MG/3ML SOLN Take 3 mLs by nebulization every 6 (six) hours as needed. (Patient not taking: Reported on 11/01/2018) 360 mL 0  . loratadine (CLARITIN) 10 MG tablet Take 10 mg by mouth daily as needed for allergies.    . nicotine polacrilex (NICORETTE) 4 MG gum Take 4 mg by mouth as needed for smoking cessation.    . sucralfate (CARAFATE) 1 g tablet Take 1 tablet (1 g total) by mouth 4 (four) times daily. (Patient not taking: Reported on 11/01/2018) 120 tablet 2  . tiotropium (SPIRIVA) 18 MCG inhalation capsule Place 1 capsule (18 mcg total) into inhaler and inhale daily. (Patient not taking: Reported on 11/01/2018) 30 capsule 6  . topiramate (TOPAMAX) 50 MG tablet Take 0.5-1 tablets (25-50 mg total) by mouth daily. 60 tablet 2   Current Facility-Administered Medications  Medication Dose Route Frequency Provider Last Rate Last Dose  . influenza vac split quadrivalent PF (FLUARIX) injection 0.5 mL  0.5 mL Intramuscular Once Alla Feeling, NP        PHYSICAL EXAMINATION: ECOG PERFORMANCE STATUS: 1 - Symptomatic but completely ambulatory  Vitals:   11/01/18 1519  BP: (!) 155/88  Pulse: (!) 101  Resp: 18  Temp: 98.5 F (36.9 C)  SpO2: 99%   Filed Weights   11/01/18 1519  Weight: 169 lb 4.8 oz (76.8 kg)    GENERAL:alert, no distress and comfortable SKIN: raised erythematous lesions to base of head and few scattered areas on her arms bilaterally  EYES: sclera clear OROPHARYNX: no  thrush or ulcers LUNGS: respirations even and unlabored, normal breathing effort  HEART:  no lower extremity edema ABDOMEN:abdomen round  Musculoskeletal:no cyanosis of digits NEURO: alert & oriented x 3 with fluent speech, normal gait Limited/distanced exam for COVID19 outbreak  LABORATORY DATA:  I have reviewed the data as listed CBC Latest Ref Rng & Units 11/01/2018 10/18/2018 10/08/2018  WBC 4.0 - 10.5 K/uL 3.4(L) 2.2(L) 5.6  Hemoglobin 12.0 - 15.0 g/dL 11.2(L) 11.9(L) 11.0(L)  Hematocrit 36.0 - 46.0 % 35.6(L) 39.2 36.4  Platelets 150 - 400 K/uL 162 314 241     CMP Latest Ref Rng & Units 11/01/2018 10/18/2018 10/08/2018  Glucose 70 - 99 mg/dL 91 112(H) 100(H)  BUN 6 - 20 mg/dL 14 11 19   Creatinine 0.44 - 1.00 mg/dL 0.74 0.68 0.83  Sodium 135 - 145 mmol/L 142 142  142  Potassium 3.5 - 5.1 mmol/L 3.5 3.1(L) 3.6  Chloride 98 - 111 mmol/L 104 106 108  CO2 22 - 32 mmol/L 30 27 27   Calcium 8.9 - 10.3 mg/dL 9.9 9.6 8.6(L)  Total Protein 6.5 - 8.1 g/dL 7.3 7.6 7.4  Total Bilirubin 0.3 - 1.2 mg/dL <0.2(L) <0.2(L) <0.2(L)  Alkaline Phos 38 - 126 U/L 100 119 105  AST 15 - 41 U/L 10(L) 10(L) 14(L)  ALT 0 - 44 U/L 10 12 13       RADIOGRAPHIC STUDIES: I have personally reviewed the radiological images as listed and agreed with the findings in the report. No results found.   ASSESSMENT & PLAN: Anglea Gordner is a 53 y.o. female with   1.Small cell lung cancer, stage III, limited stage  -Recently diagnosed in 07/2018. Imaging showed 31mm nodule of left lung with malignant appearing left mediastinal and hilar lymphadenopathy. Her Bronchoscopy biopsy confirmed mass to be small cell lung cancer while her left hilar andmediastinal lymph node biopsy were negative for malignant cells.  -CT AP and MRI brain negative for distant metastasis.  - PET scan from 09/02/18 which shows Hypermetabolic left mediastinal nodal metastasis, but no distant metastasis.  -Given her locally advanced disease (stage III  cancer). Standard treatment includes concurrent chemoRT. She began IV chemo cisplatin on day 1 and etopside day 1-3 for every 3 weeks on 8/11 -She began radiation per Dr. Lisbeth Renshaw with cycle 2 chemo on 09/28/18.  -Ms. Wiswell appears stable today. She continues to tolerate treatment moderately well with fatigue and intermittent nausea with vomiting. She manages symptoms with po medication. She is able to maintain adequate nutrition and hydration.  -she does not need supportive care today -labs stable -she will receive the flu vaccine today -due to canceling RT for death in the family, she has added on treatments until 10/28.  -she will return next week for f/u and cycle 4 chemo.   2. Smoking Cessation, COPD, H/o acute left tension pneumothorax -She has a h/o heavy smoking.  -She has COPD. She had recent execration with acute left tension pneumothorax in 07/2018.  -She underwent bronchoscopy and EBUS biopsy on 7/28 and wasadmitted after procedure for pneumothorax. Now resolved.  -She was previously on continuous canula oxygen -she has mild intermittent dry cough, no other chest pain, dyspnea. Breathing has improved  -pending PFTs per Dr. Loanne Drilling  3. Severe LUQ and left flank pain  -She had chest tube placed on left upper back S/p removal she has had severe radiating LUQ pain.  -Given this could be nerve damage causing her pain, on 09/03/18 she started gabapentin 300mg . She can titrate up to TID if tolerable. -She mainly has pain in LUQ and left shoulder. She takes MS Contin 15mg  BID and Oxycodone 5mg  twice a day in between long acting. She Still has pain in left flank for which she takes Gabapentin 300mg  in the AM and 600mg  in the PM. Refilled Gabapentin  -she admittedly takes up to 20 mg oxycodone rarely if she has extreme pain with overexertion.  -pain is usually managed with 10 mg q4 with MS contin BID. I strongly encouraged her to take as prescribed -I reviewed side effects and addictive potential  of opioids, our goal is to wean as soon as possible. She understands.  -she can not be seen by Dr. Maryjean Ka office until her Medicaid is approved, I will f/u with SW/financial aid    4. Iron deficient anemia -Developed during recent hospitalization.  -Treated  with IV Iron on 7/3 and 7/31.  -anemia improved after iron -Hgb 11.2 today, stable  5. Nausea, vomiting, poor appetite, secondary to chemo  -has sporadic nausea, especially around certain foods/smells -supported with compazine, zofran, and IVF PRN -stable  6. Hypokalemia  -normalized on K supplement, continue   PLAN: -Labs reviewed -Continue RT -Return for lab, f/u and cycle 4 chemo next week -Refer back to SW/financial aid to review status of medicaid  -Refilled Oxycodone    All questions were answered. The patient knows to call the clinic with any problems, questions or concerns. No barriers to learning was detected. I spent 20 counseling the patient face to face. The total time spent in the appointment was 25 minutes and more than 50% was on counseling and review of test results     Alla Feeling, NP 11/01/18

## 2018-11-01 ENCOUNTER — Inpatient Hospital Stay: Payer: Medicaid Other | Attending: Hematology

## 2018-11-01 ENCOUNTER — Inpatient Hospital Stay (HOSPITAL_BASED_OUTPATIENT_CLINIC_OR_DEPARTMENT_OTHER): Payer: Medicaid Other | Admitting: Nurse Practitioner

## 2018-11-01 ENCOUNTER — Other Ambulatory Visit: Payer: Self-pay

## 2018-11-01 ENCOUNTER — Other Ambulatory Visit (HOSPITAL_COMMUNITY): Admission: RE | Admit: 2018-11-01 | Payer: Self-pay | Source: Ambulatory Visit

## 2018-11-01 ENCOUNTER — Ambulatory Visit
Admission: RE | Admit: 2018-11-01 | Discharge: 2018-11-01 | Disposition: A | Discharge status: Home / Self Care | Payer: Medicaid Other | Source: Ambulatory Visit | Attending: Radiation Oncology | Admitting: Radiation Oncology

## 2018-11-01 ENCOUNTER — Encounter: Payer: Self-pay | Admitting: Nurse Practitioner

## 2018-11-01 VITALS — BP 155/88 | HR 101 | Temp 98.5°F | Resp 18 | Ht 62.0 in | Wt 169.3 lb

## 2018-11-01 DIAGNOSIS — Z23 Encounter for immunization: Secondary | ICD-10-CM | POA: Insufficient documentation

## 2018-11-01 DIAGNOSIS — Z510 Encounter for antineoplastic radiation therapy: Secondary | ICD-10-CM | POA: Diagnosis not present

## 2018-11-01 DIAGNOSIS — C771 Secondary and unspecified malignant neoplasm of intrathoracic lymph nodes: Secondary | ICD-10-CM | POA: Insufficient documentation

## 2018-11-01 DIAGNOSIS — Z79899 Other long term (current) drug therapy: Secondary | ICD-10-CM | POA: Diagnosis not present

## 2018-11-01 DIAGNOSIS — Z5111 Encounter for antineoplastic chemotherapy: Secondary | ICD-10-CM | POA: Diagnosis not present

## 2018-11-01 DIAGNOSIS — C3492 Malignant neoplasm of unspecified part of left bronchus or lung: Secondary | ICD-10-CM

## 2018-11-01 DIAGNOSIS — C3412 Malignant neoplasm of upper lobe, left bronchus or lung: Secondary | ICD-10-CM | POA: Diagnosis present

## 2018-11-01 DIAGNOSIS — C3490 Malignant neoplasm of unspecified part of unspecified bronchus or lung: Secondary | ICD-10-CM

## 2018-11-01 DIAGNOSIS — M25512 Pain in left shoulder: Secondary | ICD-10-CM

## 2018-11-01 LAB — CBC WITH DIFFERENTIAL (CANCER CENTER ONLY)
Abs Immature Granulocytes: 0.01 10*3/uL (ref 0.00–0.07)
Basophils Absolute: 0.1 10*3/uL (ref 0.0–0.1)
Basophils Relative: 2 %
Eosinophils Absolute: 0 10*3/uL (ref 0.0–0.5)
Eosinophils Relative: 1 %
HCT: 35.6 % — ABNORMAL LOW (ref 36.0–46.0)
Hemoglobin: 11.2 g/dL — ABNORMAL LOW (ref 12.0–15.0)
Immature Granulocytes: 0 %
Lymphocytes Relative: 16 %
Lymphs Abs: 0.5 10*3/uL — ABNORMAL LOW (ref 0.7–4.0)
MCH: 28.9 pg (ref 26.0–34.0)
MCHC: 31.5 g/dL (ref 30.0–36.0)
MCV: 92 fL (ref 80.0–100.0)
Monocytes Absolute: 0.4 10*3/uL (ref 0.1–1.0)
Monocytes Relative: 11 %
Neutro Abs: 2.4 10*3/uL (ref 1.7–7.7)
Neutrophils Relative %: 70 %
Platelet Count: 162 10*3/uL (ref 150–400)
RBC: 3.87 MIL/uL (ref 3.87–5.11)
RDW: 19 % — ABNORMAL HIGH (ref 11.5–15.5)
WBC Count: 3.4 10*3/uL — ABNORMAL LOW (ref 4.0–10.5)
nRBC: 0 % (ref 0.0–0.2)

## 2018-11-01 LAB — CMP (CANCER CENTER ONLY)
ALT: 10 U/L (ref 0–44)
AST: 10 U/L — ABNORMAL LOW (ref 15–41)
Albumin: 3.7 g/dL (ref 3.5–5.0)
Alkaline Phosphatase: 100 U/L (ref 38–126)
Anion gap: 8 (ref 5–15)
BUN: 14 mg/dL (ref 6–20)
CO2: 30 mmol/L (ref 22–32)
Calcium: 9.9 mg/dL (ref 8.9–10.3)
Chloride: 104 mmol/L (ref 98–111)
Creatinine: 0.74 mg/dL (ref 0.44–1.00)
GFR, Est AFR Am: >60 mL/min (ref >60)
GFR, Estimated: >60 mL/min (ref >60)
Glucose, Bld: 91 mg/dL (ref 70–99)
Potassium: 3.5 mmol/L (ref 3.5–5.1)
Sodium: 142 mmol/L (ref 135–145)
Total Bilirubin: <0.2 mg/dL — ABNORMAL LOW (ref 0.3–1.2)
Total Protein: 7.3 g/dL (ref 6.5–8.1)

## 2018-11-01 MED ORDER — INFLUENZA VAC SPLIT QUAD 0.5 ML IM SUSY
0.5000 mL | PREFILLED_SYRINGE | Freq: Once | INTRAMUSCULAR | Status: AC
  Administered 2018-11-01: 0.5 mL via INTRAMUSCULAR

## 2018-11-01 MED ORDER — INFLUENZA VAC A&B SA ADJ QUAD 0.5 ML IM PRSY: 0.5000 mL | PREFILLED_SYRINGE | Freq: Once | INTRAMUSCULAR | Status: DC

## 2018-11-01 MED ORDER — INFLUENZA VAC SPLIT QUAD 0.5 ML IM SUSY
PREFILLED_SYRINGE | INTRAMUSCULAR | Status: AC
  Filled 2018-11-01: qty 0.5

## 2018-11-01 MED ORDER — OXYCODONE HCL 5 MG PO TABS: 5.0000 mg | ORAL_TABLET | ORAL | 0 refills | Status: DC | PRN

## 2018-11-01 MED FILL — oxyCODONE HCL 5 MG TABS: 5 | 5 days supply | Qty: 60 | Fill #0

## 2018-11-02 ENCOUNTER — Encounter: Payer: Self-pay | Admitting: General Practice

## 2018-11-02 ENCOUNTER — Telehealth: Payer: Self-pay | Admitting: Nurse Practitioner

## 2018-11-02 ENCOUNTER — Encounter (HOSPITAL_COMMUNITY): Payer: Self-pay | Admitting: General Practice

## 2018-11-02 ENCOUNTER — Other Ambulatory Visit: Payer: Self-pay

## 2018-11-02 ENCOUNTER — Inpatient Hospital Stay (HOSPITAL_COMMUNITY): Admission: RE | Admit: 2018-11-02 | Payer: Self-pay | Source: Ambulatory Visit

## 2018-11-02 ENCOUNTER — Ambulatory Visit
Admission: RE | Admit: 2018-11-02 | Discharge: 2018-11-02 | Disposition: A | Discharge status: Home / Self Care | Payer: Medicaid Other | Source: Ambulatory Visit | Attending: Radiation Oncology | Admitting: Radiation Oncology

## 2018-11-02 DIAGNOSIS — Z510 Encounter for antineoplastic radiation therapy: Secondary | ICD-10-CM | POA: Diagnosis not present

## 2018-11-02 NOTE — Progress Notes (Signed)
[place] CSW Progress Note  Request from Patrick Jupiter to follow up on patient's [place] application.  This is being handled by [place] - update requested by SM.  Edwyna Shell, LCSW Clinical Social Worker Phone:  [phone]

## 2018-11-02 NOTE — Telephone Encounter (Signed)
Scheduled appt per 10/5 los.  Spoke with pt and she is aware of her appt dates and time.

## 2018-11-02 NOTE — Progress Notes (Signed)
Error  Edwyna Shell, [place] Worker Phone:  [phone]

## 2018-11-02 NOTE — Progress Notes (Signed)
[facility] CSW Progress Notes  Per Altamese Cabal, application is still pending with the Department of Social Services, it can take up to 90 days for them to make a determination. My coworker Norville Haggard (P#: [phone]) is now handling the account and she has been in contact with the patient.   Edwyna Shell, LCSW Clinical Social Worker Phone:  [phone]

## 2018-11-03 ENCOUNTER — Ambulatory Visit
Admission: RE | Admit: 2018-11-03 | Discharge: 2018-11-03 | Disposition: A | Discharge status: Home / Self Care | Payer: Medicaid Other | Source: Ambulatory Visit | Attending: Radiation Oncology | Admitting: Radiation Oncology

## 2018-11-03 ENCOUNTER — Other Ambulatory Visit: Payer: Self-pay | Admitting: Pulmonary Disease

## 2018-11-03 ENCOUNTER — Other Ambulatory Visit: Payer: Self-pay

## 2018-11-03 DIAGNOSIS — J9611 Chronic respiratory failure with hypoxia: Secondary | ICD-10-CM

## 2018-11-03 DIAGNOSIS — Z510 Encounter for antineoplastic radiation therapy: Secondary | ICD-10-CM | POA: Diagnosis not present

## 2018-11-03 NOTE — Progress Notes (Signed)
pft  

## 2018-11-04 ENCOUNTER — Ambulatory Visit: Payer: Self-pay | Admitting: Pulmonary Disease

## 2018-11-04 ENCOUNTER — Ambulatory Visit
Admission: RE | Admit: 2018-11-04 | Discharge: 2018-11-04 | Disposition: A | Discharge status: Home / Self Care | Payer: Medicaid Other | Source: Ambulatory Visit | Attending: Radiation Oncology | Admitting: Radiation Oncology

## 2018-11-04 ENCOUNTER — Other Ambulatory Visit: Payer: Self-pay

## 2018-11-04 DIAGNOSIS — Z510 Encounter for antineoplastic radiation therapy: Secondary | ICD-10-CM | POA: Diagnosis not present

## 2018-11-05 ENCOUNTER — Other Ambulatory Visit: Payer: Self-pay

## 2018-11-05 ENCOUNTER — Ambulatory Visit
Admission: RE | Admit: 2018-11-05 | Discharge: 2018-11-05 | Disposition: A | Discharge status: Home / Self Care | Payer: Medicaid Other | Source: Ambulatory Visit | Attending: Radiation Oncology | Admitting: Radiation Oncology

## 2018-11-05 DIAGNOSIS — Z510 Encounter for antineoplastic radiation therapy: Secondary | ICD-10-CM | POA: Diagnosis not present

## 2018-11-08 ENCOUNTER — Other Ambulatory Visit: Payer: Self-pay

## 2018-11-08 ENCOUNTER — Ambulatory Visit
Admission: RE | Admit: 2018-11-08 | Discharge: 2018-11-08 | Disposition: A | Discharge status: Home / Self Care | Payer: Medicaid Other | Source: Ambulatory Visit | Attending: Radiation Oncology | Admitting: Radiation Oncology

## 2018-11-08 DIAGNOSIS — Z510 Encounter for antineoplastic radiation therapy: Secondary | ICD-10-CM | POA: Diagnosis not present

## 2018-11-09 ENCOUNTER — Ambulatory Visit
Admission: RE | Admit: 2018-11-09 | Discharge: 2018-11-09 | Disposition: A | Discharge status: Home / Self Care | Payer: Medicaid Other | Source: Ambulatory Visit | Attending: Radiation Oncology | Admitting: Radiation Oncology

## 2018-11-09 ENCOUNTER — Other Ambulatory Visit: Payer: Self-pay

## 2018-11-09 DIAGNOSIS — Z510 Encounter for antineoplastic radiation therapy: Secondary | ICD-10-CM | POA: Diagnosis not present

## 2018-11-09 NOTE — Progress Notes (Signed)
[facility]   Telephone:(336) [phone] Fax:([phone]) [phone]   Clinic Follow up Note   Patient Care Team: Gildardo Pounds, NP as PCP - General (Nurse Practitioner) 11/10/2018  CHIEF COMPLAINT: F/u SCLC  SUMMARY OF ONCOLOGIC HISTORY: Oncology History Overview Note  Cancer Staging Small cell lung cancer, left upper lobe ([place]) Staging form: Lung, AJCC 8th Edition - Clinical stage from 08/24/2018: Stage IIIA (cT1a, cN2, cM0) - Signed by [name], MD on 10/17/2018    Small cell lung cancer, left upper lobe ([place])  07/29/2018 Imaging   CT Angio Chest 07/29/18  IMPRESSION: 1. Malignant appearing left mediastinal and hilar lymphadenopathy narrowing the airways at the left hilum. In the left lung only a subtle 9 mm spiculated nodule is identified in the lingula. Consider small cell carcinoma. Bronchoscopy or mediastinal node sampling might be most valuable for diagnosis. 2. Indeterminate although low-density (which typically which indicates a benign adenoma) left adrenal nodule. 3.  Negative for acute pulmonary embolus.   08/24/2018 Definitive Surgery   Bronchoscopy and EBUS biopsy on 08/24/18 by Dr. Loanne Drilling   08/24/2018 Initial Biopsy   Diagnosis 08/24/18 FINE NEEDLE ASPIRATION, ENDOSCOPIC, (EBUS) STATION # 7 LYMPH NODE(SPECIMEN 1 OF 4 COLLECTED 08/24/18): NO MALIGNANT CELLS IDENTIFIED.  FINE NEEDLE ASPIRATION, ENDOSCOPIC, (EBUS) LEFT HILAR LYMPH NODE(SPECIMEN 2 OF 4 COLLECTED 08/24/18): NO MALIGNANT CELLS IDENTIFIED.  TRANSBRONCHIAL NEEDLE ASPIRATION, WANG, (SPECIMEN 3 OF 4 COLLECTED 08/24/18): NO MALIGNANT CELLS IDENTIFIED.  BRONCHIAL BRUSHING, LUL (SPECIMEN 4 OF 4 COLLECTED 08/24/18): MALIGNANT CELLS PRESENT, MOST CONSISTENT WITH SMALL CELL CARCINOMA. SEE COMMENT.   08/24/2018 Cancer Staging   Staging form: Lung, AJCC 8th Edition - Clinical stage from 08/24/2018: Stage IIIA (cT1a, cN2, cM0) - Signed by [name], MD on 10/17/2018   08/26/2018 Initial Diagnosis   Small  cell lung cancer, left ([place])   08/26/2018 Imaging   CT AP W Contrast 08/26/18  IMPRESSION: 1. 2.9 cm left adrenal nodule cannot be definitively characterized on this study. Potentially a lipid poor adenoma, but metastatic disease not excluded. MRI of the abdomen with and without contrast may prove helpful to further evaluate. 2. No other findings to suggest metastatic disease in the abdomen/pelvis. 3. Left base atelectasis with small left pleural effusion.   08/27/2018 Imaging   MRI brain 08/27/18  IMPRESSION: 1. Motion degraded, incomplete examination. 2. Subcentimeter focus of diffusion signal abnormality in the left cerebellum. No enhancement is evident to strongly suggest a metastasis, and this may reflect an acute to subacute small vessel infarct. 3. No evidence of intracranial metastatic disease elsewhere. 4. Given motion artifact and incomplete postcontrast imaging, consider short-term follow-up MRI (possibly with sedation) to further evaluate the left cerebellar abnormality and provide a more thorough evaluation for metastatic disease.   09/02/2018 PET scan   IMPRESSION: 1. Hypermetabolic left hilar mass is identified encasing the left mainstem bronchus resulting in postobstructive atelectasis in the left upper lobe. 2. Hypermetabolic left mediastinal nodal metastasis. 3. Loculated left pleural effusion is identified with mild increased FDG uptake. Cannot rule out malignant pleural effusion. 4. No evidence for metastatic disease to the abdomen or pelvis or skeletal structures.     09/07/2018 -  Chemotherapy   Concurrent chemoRT with IV cisplatin on day 1 and etopside day 1-3 for every 3 weeks starting 8/11. Will start radiation with Dr. Lisbeth Renshaw with cycle 2.    09/14/2018 Procedure   Thoracentesis yielding 750 mL   09/28/2018 -  Radiation Therapy   Concurrent chemoRT with Dr. Lisbeth Renshaw. Start  with cycle 2 chemo on 09/28/18      CURRENT THERAPY: Concurrent chemoRT with IV  cisplatin on day 1 and etopside day 1-3 for every 3 weeks starting 8/11.Startedradiation with cycle 2 on 9/1.  INTERVAL HISTORY: Marissa [name] returns for f/u and treatment as scheduled. She completed cycle 3 on 9/21- 9/23. She continues RT. She reports a flare of right knee pain recently and back pressure after prolonged standing. She takes baclofen PRN which helps somewhat. She is able to remain functional. Her left shoulder pain and left chest beneath her breast pains are stable, still severe at times. She takes MS contin and 20-25 mg oxy IR q4. She has tried 10 mg which does not touch her pain. She has taken this dose mostly consistently since she's been on this medication. Denies drowsiness or confusion. She has intermittent constipation that is managed with stool softener. She had one episode of bleeding with straining recently, has known hemorrhoids. Mild nausea is controlled with compazine. Appetite is normal. No recent fever or chills. Cough with phlegm is stable. She still smokes, 1 cigarette lasts 5 hours. She is trying to quit. Denies chest pain or dyspnea. Denies neuropathy.    MEDICAL HISTORY:  Past Medical History:  Diagnosis Date  . Asthma   . Chronic headaches   . COPD (chronic obstructive pulmonary disease) ([place])   . Depression   . Heart murmur   . Small cell lung cancer ([place])     SURGICAL HISTORY: Past Surgical History:  Procedure Laterality Date  . BRONCHIAL BIOPSY  08/24/2018   Procedure: BRONCHIAL BIOPSIES;  Surgeon: Margaretha Seeds, MD;  Location: [facility];  Service: Cardiopulmonary;;  . BRONCHIAL BRUSHINGS  08/24/2018   Procedure: BRONCHIAL BRUSHINGS;  Surgeon: Margaretha Seeds, MD;  Location: [facility];  Service: Cardiopulmonary;;  . BRONCHIAL NEEDLE ASPIRATION BIOPSY  08/24/2018   Procedure: BRONCHIAL NEEDLE ASPIRATION BIOPSIES;  Surgeon: Margaretha Seeds, MD;  Location: [facility];  Service: Cardiopulmonary;;  . ENDOBRONCHIAL ULTRASOUND N/A 08/24/2018    Procedure: ENDOBRONCHIAL ULTRASOUND;  Surgeon: Margaretha Seeds, MD;  Location: [facility];  Service: Cardiopulmonary;  Laterality: N/A;  . IR THORACENTESIS ASP PLEURAL SPACE W/IMG GUIDE  09/14/2018  . TUBAL LIGATION    . VIDEO BRONCHOSCOPY  08/24/2018   Procedure: VIDEO BRONCHOSCOPY;  Surgeon: Margaretha Seeds, MD;  Location: [facility];  Service: Cardiopulmonary;;    I have reviewed the social history and family history with the patient and they are unchanged from previous note.  ALLERGIES:  is allergic to penicillins.  MEDICATIONS:  Current Outpatient Medications  Medication Sig Dispense Refill  . baclofen (LIORESAL) 10 MG tablet Take 1 tablet (10 mg total) by mouth 3 (three) times daily as needed for muscle spasms. 30 each 0  . gabapentin (NEURONTIN) 300 MG capsule Take 1 tab in morning and 2 tabs at bedtime 90 capsule 0  . loratadine (CLARITIN) 10 MG tablet Take 10 mg by mouth daily as needed for allergies.    Marland Kitchen morphine (MS CONTIN) 30 MG 12 hr tablet Take 1 tablet (30 mg total) by mouth every 12 (twelve) hours. 60 tablet 0  . nystatin (MYCOSTATIN/NYSTOP) powder Apply topically 4 (four) times daily. 15 g 1  . ondansetron (ZOFRAN) 8 MG tablet Take 1 tablet (8 mg total) by mouth 2 (two) times daily as needed. Start on the third day after cisplatin chemotherapy. (Patient taking differently: Take 8 mg by mouth 2 (two) times daily as needed for nausea or vomiting. Start  on the third day after cisplatin chemotherapy.) 30 tablet 1  . oxyCODONE (OXY IR/ROXICODONE) 5 MG immediate release tablet Take 1-2 tablets (5-10 mg total) by mouth every 4 (four) hours as needed for severe pain. 60 tablet 0  . potassium chloride SA (K-DUR) 20 MEQ tablet Take 1 tablet (20 mEq total) by mouth daily. 30 tablet 1  . PRESCRIPTION MEDICATION Take 1 tablet by mouth as needed (anxiety/sleep).    . prochlorperazine (COMPAZINE) 10 MG tablet Take 1 tablet (10 mg total) by mouth every 6 (six) hours as needed (Nausea  or vomiting). 30 tablet 1  . sucralfate (CARAFATE) 1 g tablet Take 1 tablet (1 g total) by mouth 4 (four) times daily. 120 tablet 2  . traZODone (DESYREL) 100 MG tablet Take 1 tablet (100 mg total) by mouth at bedtime. Patient will pick up scripts today. 90 tablet 2  . albuterol (VENTOLIN HFA) 108 (90 Base) MCG/ACT inhaler Inhale 2 puffs into the lungs every 6 (six) hours as needed for wheezing or shortness of breath. (Patient not taking: Reported on 11/01/2018) 6.7 g 3  . budesonide-formoterol (SYMBICORT) 160-4.5 MCG/ACT inhaler Inhale 2 puffs into the lungs 2 (two) times daily. (Patient not taking: Reported on 11/01/2018) 1 Inhaler 0  . ipratropium-albuterol (DUONEB) 0.5-2.5 (3) MG/3ML SOLN Take 3 mLs by nebulization every 6 (six) hours as needed. (Patient not taking: Reported on 11/01/2018) 360 mL 0  . nicotine polacrilex (NICORETTE) 4 MG gum Take 4 mg by mouth as needed for smoking cessation.    Marland Kitchen tiotropium (SPIRIVA) 18 MCG inhalation capsule Place 1 capsule (18 mcg total) into inhaler and inhale daily. (Patient not taking: Reported on 11/01/2018) 30 capsule 6  . topiramate (TOPAMAX) 50 MG tablet Take 0.5-1 tablets (25-50 mg total) by mouth daily. 60 tablet 2   No current facility-administered medications for this visit.    Facility-Administered Medications Ordered in Other Visits  Medication Dose Route Frequency Provider Last Rate Last Dose  . CISplatin (PLATINOL) 152 mg in sodium chloride 0.9 % 500 mL chemo infusion  80 mg/m2 (Treatment Plan Recorded) Intravenous Once [name], MD      . etoposide (VEPESID) 190 mg in sodium chloride 0.9 % 500 mL chemo infusion  100 mg/m2 (Treatment Plan Recorded) Intravenous Once [name], MD      . fosaprepitant (EMEND) 150 mg, dexamethasone (DECADRON) 12 mg in sodium chloride 0.9 % 145 mL IVPB   Intravenous Once [name], MD      . palonosetron (ALOXI) injection 0.25 mg  0.25 mg Intravenous Once [name], MD        PHYSICAL EXAMINATION: ECOG PERFORMANCE  STATUS: 1 - Symptomatic but completely ambulatory  Vitals:   11/10/18 0839  BP: 116/61  Pulse: (!) 104  Resp: 18  Temp: 98.3 F (36.8 C)  SpO2: 96%   Filed Weights   11/10/18 0839  Weight: 168 lb 8 oz (76.4 kg)    GENERAL:alert, no distress and comfortable SKIN: no rash  EYES: sclera clear OROPHARYNX: no thrush or ulcers  LYMPH:  no palpable cervical or supraclavicular lymphadenopathy LUNGS: clear with normal breathing effort HEART: regular rate & rhythm, no lower extremity edema ABDOMEN:abdomen round Musculoskeletal:no cyanosis of digits. No focal spinal tenderness  NEURO: alert & oriented x 3 with fluent speech, normal gait  LABORATORY DATA:  I have reviewed the data as listed CBC Latest Ref Rng & Units 11/10/2018 11/01/2018 10/18/2018  WBC 4.0 - 10.5 K/uL 3.7(L) 3.4(L) 2.2(L)  Hemoglobin 12.0 -  15.0 g/dL 12.8 11.2(L) 11.9(L)  Hematocrit 36.0 - 46.0 % 40.1 35.6(L) 39.2  Platelets 150 - 400 K/uL 319 162 314     CMP Latest Ref Rng & Units 11/10/2018 11/01/2018 10/18/2018  Glucose 70 - 99 mg/dL 111(H) 91 112(H)  BUN 6 - 20 mg/dL 12 14 11   Creatinine 0.44 - 1.00 mg/dL 0.75 0.74 0.68  Sodium 135 - 145 mmol/L 143 142 142  Potassium 3.5 - 5.1 mmol/L 3.7 3.5 3.1(L)  Chloride 98 - 111 mmol/L 106 104 106  CO2 22 - 32 mmol/L 25 30 27   Calcium 8.9 - 10.3 mg/dL 9.4 9.9 9.6  Total Protein 6.5 - 8.1 g/dL 7.5 7.3 7.6  Total Bilirubin 0.3 - 1.2 mg/dL <0.2(L) <0.2(L) <0.2(L)  Alkaline Phos 38 - 126 U/L 87 100 119  AST 15 - 41 U/L 12(L) 10(L) 10(L)  ALT 0 - 44 U/L 11 10 12       RADIOGRAPHIC STUDIES: I have personally reviewed the radiological images as listed and agreed with the findings in the report. No results found.   ASSESSMENT & PLAN: Marissa [name] a 53 y.o.femalewith   1.Small cell lung cancer, stage III, limited stage  -Diagnosed in 07/2018. Imaging showed 69mm nodule of left lung with malignant appearing left mediastinal and hilar lymphadenopathy. Her Bronchoscopy  biopsy confirmed mass to be small cell lung cancer while her left hilar andmediastinal lymph node biopsy were negative for malignant cells.  -CT AP and MRI brain negative for distant metastasis.  - PET scan from 09/02/18 which shows Hypermetabolic left mediastinal nodal metastasis, but no distant metastasis.  -Given her locally advanced disease (stage III cancer). Standard treatment includes concurrent chemoRT. She began IV chemo cisplatin on day 1 and etopside day 1-3 for every 3 weeks on 8/11 -She began radiation per Dr. Lisbeth Renshaw with cycle 2 chemo on 09/28/18.  -Ms. How appears stable today. She continues to tolerate treatment moderately well with fatigue and intermittent n/v that is managed well with compazine. I refilled for her today. She is able to maintain adequate nutrition and hydration.  -chemo was dose-reduced with cycle 3 due to mild neutropenia, ANC recovered well and is normal today. CBC and CMP otherwise stable.  -She will proceed with cycle 4 chemo at full dose. She will complete RT on 10/28 which will complete standard treatment -she will return for labs weekly x2 and f/u in 2 weeks on the last day of radiation -then proceed with surveillance. We will monitor her closely with scan in 3 months. I reviewed small cell lung cancer has a high risk of recurrence and metastasis, especially to the brain.  -She has not discussed cranial radiation with Dr. Lisbeth Renshaw yet.   2. Smoking Cessation, COPD,H/oacute left tension pneumothorax -She has a h/o heavy smoking.  -She has COPD. She had recent execration with acute left tension pneumothorax in 07/2018.  -She underwent bronchoscopy and EBUS biopsy on 7/28 and wasadmitted after procedure for pneumothorax. Now resolved.  -Shewaspreviouslyoncontinuous canula oxygen -pending PFTs per Dr. Loanne Drilling -Chronic cough is stable; no other chest pain, dyspnea, or hypoxia   3. Severe LUQ and left flank pain  -She had chest tube placed on left upper back  S/p removal she has had severe radiating LUQ pain.  -Given this could be nerve damage causing her pain, on 09/03/18 she started gabapentin 300mg . She can titrate up to TID if tolerable. -She mainly has pain in LUQ and left shoulder. She takes MS Contin 15mg  BID and Oxycodone 5mg   twice a day in between long acting. She Still has pain in left flank for which she takes Gabapentin 300mg  in the AM and 600mg  in the PM. Refilled Gabapentin  -she admittedly takes up to 20 mg oxycodone rarely if she has extreme pain with overexertion.  -pain is usually managed with 10 mg q4 with MS contin BID. I strongly encouraged her to take as prescribed -she can not be seen by Dr. Maryjean Ka office until her Medicaid is approved which is pending, I checked with SW last week. [facility] financial aid is handling this.  -Her pain is not controlled, she admits to take 20-25 mg oxy IR consistently which does not always help -I will increase MS contin to 30 mg q12. I again strongly encouraged her to take no more than 10 mg oxy IR for breakthrough q4. We reviewed the addictive nature of opioids. She agrees. I also explained my goal is to wean her off pain medication asap. She understands.  -She has back pressure and spasm lately, possibly related to weight shift from right knee pain. I encouraged her to use lumbar support when she stands doing ADLs, she agrees.   4. Iron deficient anemia -Developed during recent hospitalization.  -Treated with IV Iron on 7/3 and 7/31. -anemia improved after iron  5.Nausea, vomiting, poor appetite, secondary to chemo  -has sporadic nausea, especially around certain foods/smells -supported with compazine, zofran, and IVF PRN -stable  6. Hypokalemia  -normalized on K supplement, continue   PLAN: -Labs reviewed -Proceed with cycle 4 chemo today, increase to full dose  -Complete RT 10/28 -Lab next week -F/u in 2 weeks -Reviewed high risk for recurrence and metastasis and surveillance plan  (scan in 3 months) -Lumbar support for back pain -Refilled meds -Increased MS contin to 30 mg q12, strongly encouraged her to use oxy 10 mg q4 no more than that, goal to wean off opioids asap  All questions were answered. The patient knows to call the clinic with any problems, questions or concerns. No barriers to learning was detected. I spent 20 minutes counseling the patient face to face. The total time spent in the appointment was 25 minutes and more than 50% was on counseling and review of test results     Alla Feeling, NP 11/10/18

## 2018-11-10 ENCOUNTER — Inpatient Hospital Stay: Payer: Medicaid Other

## 2018-11-10 ENCOUNTER — Other Ambulatory Visit: Payer: Self-pay

## 2018-11-10 ENCOUNTER — Inpatient Hospital Stay (HOSPITAL_BASED_OUTPATIENT_CLINIC_OR_DEPARTMENT_OTHER): Payer: Medicaid Other | Admitting: Nurse Practitioner

## 2018-11-10 ENCOUNTER — Other Ambulatory Visit: Payer: Self-pay | Admitting: Hematology

## 2018-11-10 ENCOUNTER — Encounter: Payer: Self-pay | Admitting: Nurse Practitioner

## 2018-11-10 ENCOUNTER — Ambulatory Visit
Admission: RE | Admit: 2018-11-10 | Discharge: 2018-11-10 | Disposition: A | Discharge status: Home / Self Care | Payer: Medicaid Other | Source: Ambulatory Visit | Attending: Radiation Oncology | Admitting: Radiation Oncology

## 2018-11-10 ENCOUNTER — Ambulatory Visit: Payer: Medicaid Other

## 2018-11-10 VITALS — BP 116/61 | HR 104 | Temp 98.3°F | Resp 18 | Ht 62.0 in | Wt 168.5 lb

## 2018-11-10 VITALS — HR 90

## 2018-11-10 DIAGNOSIS — C3412 Malignant neoplasm of upper lobe, left bronchus or lung: Secondary | ICD-10-CM

## 2018-11-10 DIAGNOSIS — Z5111 Encounter for antineoplastic chemotherapy: Secondary | ICD-10-CM | POA: Diagnosis not present

## 2018-11-10 DIAGNOSIS — C3490 Malignant neoplasm of unspecified part of unspecified bronchus or lung: Secondary | ICD-10-CM

## 2018-11-10 DIAGNOSIS — Z510 Encounter for antineoplastic radiation therapy: Secondary | ICD-10-CM | POA: Diagnosis not present

## 2018-11-10 DIAGNOSIS — M25512 Pain in left shoulder: Secondary | ICD-10-CM

## 2018-11-10 DIAGNOSIS — C3492 Malignant neoplasm of unspecified part of left bronchus or lung: Secondary | ICD-10-CM

## 2018-11-10 LAB — CMP (CANCER CENTER ONLY)
ALT: 11 U/L (ref 0–44)
AST: 12 U/L — ABNORMAL LOW (ref 15–41)
Albumin: 3.9 g/dL (ref 3.5–5.0)
Alkaline Phosphatase: 87 U/L (ref 38–126)
Anion gap: 12 (ref 5–15)
BUN: 12 mg/dL (ref 6–20)
CO2: 25 mmol/L (ref 22–32)
Calcium: 9.4 mg/dL (ref 8.9–10.3)
Chloride: 106 mmol/L (ref 98–111)
Creatinine: 0.75 mg/dL (ref 0.44–1.00)
GFR, Est AFR Am: >60 mL/min (ref >60)
GFR, Estimated: >60 mL/min (ref >60)
Glucose, Bld: 111 mg/dL — ABNORMAL HIGH (ref 70–99)
Potassium: 3.7 mmol/L (ref 3.5–5.1)
Sodium: 143 mmol/L (ref 135–145)
Total Bilirubin: <0.2 mg/dL — ABNORMAL LOW (ref 0.3–1.2)
Total Protein: 7.5 g/dL (ref 6.5–8.1)

## 2018-11-10 LAB — CBC WITH DIFFERENTIAL (CANCER CENTER ONLY)
Abs Immature Granulocytes: 0.02 10*3/uL (ref 0.00–0.07)
Basophils Absolute: 0 10*3/uL (ref 0.0–0.1)
Basophils Relative: 1 %
Eosinophils Absolute: 0.1 10*3/uL (ref 0.0–0.5)
Eosinophils Relative: 3 %
HCT: 40.1 % (ref 36.0–46.0)
Hemoglobin: 12.8 g/dL (ref 12.0–15.0)
Immature Granulocytes: 1 %
Lymphocytes Relative: 15 %
Lymphs Abs: 0.6 10*3/uL — ABNORMAL LOW (ref 0.7–4.0)
MCH: 29.5 pg (ref 26.0–34.0)
MCHC: 31.9 g/dL (ref 30.0–36.0)
MCV: 92.4 fL (ref 80.0–100.0)
Monocytes Absolute: 0.4 10*3/uL (ref 0.1–1.0)
Monocytes Relative: 11 %
Neutro Abs: 2.6 10*3/uL (ref 1.7–7.7)
Neutrophils Relative %: 69 %
Platelet Count: 319 10*3/uL (ref 150–400)
RBC: 4.34 MIL/uL (ref 3.87–5.11)
RDW: 18.6 % — ABNORMAL HIGH (ref 11.5–15.5)
WBC Count: 3.7 10*3/uL — ABNORMAL LOW (ref 4.0–10.5)
nRBC: 0 % (ref 0.0–0.2)

## 2018-11-10 MED ORDER — PALONOSETRON HCL INJECTION 0.25 MG/5ML
INTRAVENOUS | Status: AC
  Filled 2018-11-10: qty 5

## 2018-11-10 MED ORDER — PROCHLORPERAZINE MALEATE 10 MG PO TABS: 10.0000 mg | ORAL_TABLET | Freq: Four times a day (QID) | ORAL | 1 refills | Status: DC | PRN

## 2018-11-10 MED ORDER — SODIUM CHLORIDE 0.9 % IV SOLN
Freq: Once | INTRAVENOUS | Status: AC
  Administered 2018-11-10: 09:00:00 via INTRAVENOUS
  Filled 2018-11-10: qty 250

## 2018-11-10 MED ORDER — OXYCODONE HCL 5 MG PO TABS: 5.0000 mg | ORAL_TABLET | ORAL | 0 refills | Status: DC | PRN

## 2018-11-10 MED ORDER — SODIUM CHLORIDE 0.9 % IV SOLN
Freq: Once | INTRAVENOUS | Status: AC
  Administered 2018-11-10: 12:00:00 via INTRAVENOUS
  Filled 2018-11-10: qty 5

## 2018-11-10 MED ORDER — MORPHINE SULFATE ER 30 MG PO TBCR: 30.0000 mg | EXTENDED_RELEASE_TABLET | Freq: Two times a day (BID) | ORAL | 0 refills | Status: DC

## 2018-11-10 MED ORDER — PALONOSETRON HCL INJECTION 0.25 MG/5ML
0.2500 mg | Freq: Once | INTRAVENOUS | Status: AC
  Administered 2018-11-10: 0.25 mg via INTRAVENOUS

## 2018-11-10 MED ORDER — SODIUM CHLORIDE 0.9 % IV SOLN
80.0000 mg/m2 | Freq: Once | INTRAVENOUS | Status: AC
  Administered 2018-11-10: 152 mg via INTRAVENOUS
  Filled 2018-11-10: qty 152

## 2018-11-10 MED ORDER — SODIUM CHLORIDE 0.9 % IV SOLN
100.0000 mg/m2 | Freq: Once | INTRAVENOUS | Status: AC
  Administered 2018-11-10: 190 mg via INTRAVENOUS
  Filled 2018-11-10: qty 9.5

## 2018-11-10 MED ORDER — BACLOFEN 10 MG PO TABS: 10.0000 mg | ORAL_TABLET | Freq: Three times a day (TID) | ORAL | 0 refills | Status: DC | PRN

## 2018-11-10 MED ORDER — POTASSIUM CHLORIDE 2 MEQ/ML IV SOLN
Freq: Once | INTRAVENOUS | Status: AC
  Administered 2018-11-10: 10:00:00 via INTRAVENOUS
  Filled 2018-11-10: qty 10

## 2018-11-10 MED FILL — PROCHLORPERAZINE 10 MG TAB: 10 | 7 days supply | Qty: 30 | Fill #0

## 2018-11-10 MED FILL — BACLOFEN 10 MG TABS: 10 | 10 days supply | Qty: 30 | Fill #0

## 2018-11-10 MED FILL — oxyCODONE HCL 5 MG TABS: 5 | 5 days supply | Qty: 60 | Fill #0

## 2018-11-11 ENCOUNTER — Telehealth: Payer: Self-pay | Admitting: Nurse Practitioner

## 2018-11-11 ENCOUNTER — Other Ambulatory Visit: Payer: Self-pay

## 2018-11-11 ENCOUNTER — Ambulatory Visit: Payer: Self-pay

## 2018-11-11 ENCOUNTER — Ambulatory Visit
Admission: RE | Admit: 2018-11-11 | Discharge: 2018-11-11 | Disposition: A | Discharge status: Home / Self Care | Payer: Medicaid Other | Source: Ambulatory Visit | Attending: Radiation Oncology | Admitting: Radiation Oncology

## 2018-11-11 ENCOUNTER — Inpatient Hospital Stay: Payer: Medicaid Other

## 2018-11-11 ENCOUNTER — Ambulatory Visit: Payer: Medicaid Other

## 2018-11-11 VITALS — BP 141/63 | HR 77 | Temp 98.2°F | Resp 16

## 2018-11-11 DIAGNOSIS — Z5111 Encounter for antineoplastic chemotherapy: Secondary | ICD-10-CM | POA: Diagnosis not present

## 2018-11-11 DIAGNOSIS — Z510 Encounter for antineoplastic radiation therapy: Secondary | ICD-10-CM | POA: Diagnosis not present

## 2018-11-11 DIAGNOSIS — C3412 Malignant neoplasm of upper lobe, left bronchus or lung: Secondary | ICD-10-CM

## 2018-11-11 MED ORDER — DEXAMETHASONE SODIUM PHOSPHATE 10 MG/ML IJ SOLN
10.0000 mg | Freq: Once | INTRAMUSCULAR | Status: AC
  Administered 2018-11-11: 10 mg via INTRAVENOUS

## 2018-11-11 MED ORDER — DEXAMETHASONE SODIUM PHOSPHATE 10 MG/ML IJ SOLN
INTRAMUSCULAR | Status: AC
  Filled 2018-11-11: qty 1

## 2018-11-11 MED ORDER — SODIUM CHLORIDE 0.9 % IV SOLN
100.0000 mg/m2 | Freq: Once | INTRAVENOUS | Status: AC
  Administered 2018-11-11: 190 mg via INTRAVENOUS
  Filled 2018-11-11: qty 9.5

## 2018-11-11 MED ORDER — SODIUM CHLORIDE 0.9 % IV SOLN
Freq: Once | INTRAVENOUS | Status: AC
  Administered 2018-11-11: 15:00:00 via INTRAVENOUS
  Filled 2018-11-11: qty 250

## 2018-11-11 MED FILL — MORPHINE SULF ER 30 MG TAB: 30 | 30 days supply | Qty: 60 | Fill #0

## 2018-11-11 NOTE — Telephone Encounter (Signed)
Scheduled appt per 10/14 los.  Spoke with patient and she is aware of her appt date and time.

## 2018-11-11 NOTE — Patient Instructions (Signed)
[facility] Discharge Instructions for Patients Receiving Chemotherapy  Today you received the following chemotherapy agents:  Etoposide  To help prevent nausea and vomiting after your treatment, we encourage you to take your nausea medication as prescribed.   If you develop nausea and vomiting that is not controlled by your nausea medication, call the clinic.   BELOW ARE SYMPTOMS THAT SHOULD BE REPORTED IMMEDIATELY:  *FEVER GREATER THAN 100.5 F  *CHILLS WITH OR WITHOUT FEVER  NAUSEA AND VOMITING THAT IS NOT CONTROLLED WITH YOUR NAUSEA MEDICATION  *UNUSUAL SHORTNESS OF BREATH  *UNUSUAL BRUISING OR BLEEDING  TENDERNESS IN MOUTH AND THROAT WITH OR WITHOUT PRESENCE OF ULCERS  *URINARY PROBLEMS  *BOWEL PROBLEMS  UNUSUAL RASH Items with * indicate a potential emergency and should be followed up as soon as possible.  Feel free to call the clinic should you have any questions or concerns. The clinic phone number is [phone].  Please show the [facility] at check-in to the Emergency Department and triage nurse.

## 2018-11-12 ENCOUNTER — Ambulatory Visit: Payer: Medicaid Other

## 2018-11-12 ENCOUNTER — Other Ambulatory Visit: Payer: Self-pay

## 2018-11-12 ENCOUNTER — Ambulatory Visit: Payer: Self-pay

## 2018-11-12 ENCOUNTER — Inpatient Hospital Stay: Payer: Medicaid Other

## 2018-11-12 ENCOUNTER — Ambulatory Visit
Admission: RE | Admit: 2018-11-12 | Discharge: 2018-11-12 | Disposition: A | Discharge status: Home / Self Care | Payer: Medicaid Other | Source: Ambulatory Visit | Attending: Radiation Oncology | Admitting: Radiation Oncology

## 2018-11-12 VITALS — BP 137/51 | HR 88 | Temp 98.3°F | Resp 16

## 2018-11-12 DIAGNOSIS — C3412 Malignant neoplasm of upper lobe, left bronchus or lung: Secondary | ICD-10-CM

## 2018-11-12 DIAGNOSIS — Z510 Encounter for antineoplastic radiation therapy: Secondary | ICD-10-CM | POA: Diagnosis not present

## 2018-11-12 DIAGNOSIS — Z5111 Encounter for antineoplastic chemotherapy: Secondary | ICD-10-CM | POA: Diagnosis not present

## 2018-11-12 MED ORDER — SODIUM CHLORIDE 0.9 % IV SOLN
Freq: Once | INTRAVENOUS | Status: AC
  Administered 2018-11-12: 15:00:00 via INTRAVENOUS
  Filled 2018-11-12: qty 250

## 2018-11-12 MED ORDER — DEXAMETHASONE SODIUM PHOSPHATE 10 MG/ML IJ SOLN
10.0000 mg | Freq: Once | INTRAMUSCULAR | Status: AC
  Administered 2018-11-12: 10 mg via INTRAVENOUS

## 2018-11-12 MED ORDER — DEXAMETHASONE SODIUM PHOSPHATE 10 MG/ML IJ SOLN
INTRAMUSCULAR | Status: AC
  Filled 2018-11-12: qty 1

## 2018-11-12 MED ORDER — SODIUM CHLORIDE 0.9 % IV SOLN
100.0000 mg/m2 | Freq: Once | INTRAVENOUS | Status: AC
  Administered 2018-11-12: 190 mg via INTRAVENOUS
  Filled 2018-11-12: qty 9.5

## 2018-11-12 NOTE — Patient Instructions (Signed)
[facility] Discharge Instructions for Patients Receiving Chemotherapy  Today you received the following chemotherapy agents: etoposide  To help prevent nausea and vomiting after your treatment, we encourage you to take your nausea medication as directed.   If you develop nausea and vomiting that is not controlled by your nausea medication, call the clinic.   BELOW ARE SYMPTOMS THAT SHOULD BE REPORTED IMMEDIATELY:  *FEVER GREATER THAN 100.5 F  *CHILLS WITH OR WITHOUT FEVER  NAUSEA AND VOMITING THAT IS NOT CONTROLLED WITH YOUR NAUSEA MEDICATION  *UNUSUAL SHORTNESS OF BREATH  *UNUSUAL BRUISING OR BLEEDING  TENDERNESS IN MOUTH AND THROAT WITH OR WITHOUT PRESENCE OF ULCERS  *URINARY PROBLEMS  *BOWEL PROBLEMS  UNUSUAL RASH Items with * indicate a potential emergency and should be followed up as soon as possible.  Feel free to call the clinic should you have any questions or concerns. The clinic phone number is [phone].  Please show the CHEMO ALERT CARD at check-in to the Emergency Department and triage nurse.   

## 2018-11-15 ENCOUNTER — Ambulatory Visit: Payer: Medicaid Other

## 2018-11-16 ENCOUNTER — Ambulatory Visit: Payer: Medicaid Other

## 2018-11-17 ENCOUNTER — Ambulatory Visit: Payer: Medicaid Other

## 2018-11-17 ENCOUNTER — Inpatient Hospital Stay: Payer: Medicaid Other

## 2018-11-18 ENCOUNTER — Ambulatory Visit: Payer: Medicaid Other

## 2018-11-18 ENCOUNTER — Other Ambulatory Visit: Payer: Self-pay | Admitting: Nurse Practitioner

## 2018-11-18 ENCOUNTER — Telehealth: Payer: Self-pay

## 2018-11-18 MED ORDER — CLINDAMYCIN HCL 150 MG PO CAPS: ORAL_CAPSULE | ORAL | 0 refills | Status: DC

## 2018-11-18 MED FILL — CLINDAMYCIN HCL 150 MG CAPS: 150 | 6 days supply | Qty: 22 | Fill #0

## 2018-11-18 NOTE — Telephone Encounter (Signed)
Patient calls for Cira Rue NP requesting an antibiotic stating she has an abscessed tooth.  Her [phone]

## 2018-11-18 NOTE — Progress Notes (Signed)
I called patient back regarding her concern for abscessed tooth. Sunday a tooth broke off with retained fragments. Her jaw became swollen and tender. It hurts to eat. No drainage. Oragel did not help. She does not have a dentist. This has happened before and is requesting antibiotics. Due to PCN intolerance, I prescribed clindamycin 150 mg take 2 stat then 1 capsule TID until gone. I encouraged her to f/u with a dentist. She verbalizes understanding and appreciates the call.  Cira Rue, NP  11/18/2018

## 2018-11-19 ENCOUNTER — Ambulatory Visit: Payer: Medicaid Other

## 2018-11-22 ENCOUNTER — Ambulatory Visit: Payer: Medicaid Other

## 2018-11-23 ENCOUNTER — Ambulatory Visit: Payer: Medicaid Other

## 2018-11-24 ENCOUNTER — Ambulatory Visit
Admission: RE | Admit: 2018-11-24 | Discharge: 2018-11-24 | Disposition: A | Discharge status: Home / Self Care | Payer: Medicaid Other | Source: Ambulatory Visit | Attending: Radiation Oncology | Admitting: Radiation Oncology

## 2018-11-24 ENCOUNTER — Other Ambulatory Visit: Payer: Self-pay

## 2018-11-24 ENCOUNTER — Telehealth: Payer: Self-pay | Admitting: Medical Oncology

## 2018-11-24 ENCOUNTER — Ambulatory Visit: Payer: Medicaid Other

## 2018-11-24 DIAGNOSIS — Z510 Encounter for antineoplastic radiation therapy: Secondary | ICD-10-CM | POA: Diagnosis not present

## 2018-11-24 NOTE — Telephone Encounter (Signed)
Confirmed appts  Pt reports that she is  coughing more often  with some pick tinged "pflegm"

## 2018-11-25 ENCOUNTER — Inpatient Hospital Stay: Payer: Medicaid Other

## 2018-11-25 ENCOUNTER — Encounter: Payer: Self-pay | Admitting: General Practice

## 2018-11-25 ENCOUNTER — Encounter: Payer: Self-pay | Admitting: Hematology

## 2018-11-25 ENCOUNTER — Inpatient Hospital Stay (HOSPITAL_BASED_OUTPATIENT_CLINIC_OR_DEPARTMENT_OTHER): Payer: Medicaid Other | Admitting: Hematology

## 2018-11-25 ENCOUNTER — Ambulatory Visit: Payer: Medicaid Other

## 2018-11-25 ENCOUNTER — Other Ambulatory Visit: Payer: Self-pay

## 2018-11-25 ENCOUNTER — Ambulatory Visit
Admission: RE | Admit: 2018-11-25 | Discharge: 2018-11-25 | Disposition: A | Discharge status: Home / Self Care | Payer: Medicaid Other | Source: Ambulatory Visit | Attending: Radiation Oncology | Admitting: Radiation Oncology

## 2018-11-25 VITALS — BP 126/65 | HR 96 | Temp 98.8°F | Resp 18 | Ht 62.0 in | Wt 166.0 lb

## 2018-11-25 DIAGNOSIS — Z510 Encounter for antineoplastic radiation therapy: Secondary | ICD-10-CM | POA: Diagnosis not present

## 2018-11-25 DIAGNOSIS — C3412 Malignant neoplasm of upper lobe, left bronchus or lung: Secondary | ICD-10-CM | POA: Diagnosis not present

## 2018-11-25 DIAGNOSIS — Z5111 Encounter for antineoplastic chemotherapy: Secondary | ICD-10-CM | POA: Diagnosis not present

## 2018-11-25 DIAGNOSIS — M25512 Pain in left shoulder: Secondary | ICD-10-CM | POA: Diagnosis not present

## 2018-11-25 DIAGNOSIS — C3490 Malignant neoplasm of unspecified part of unspecified bronchus or lung: Secondary | ICD-10-CM

## 2018-11-25 DIAGNOSIS — C3492 Malignant neoplasm of unspecified part of left bronchus or lung: Secondary | ICD-10-CM

## 2018-11-25 LAB — CBC WITH DIFFERENTIAL (CANCER CENTER ONLY)
Abs Immature Granulocytes: 0 10*3/uL (ref 0.00–0.07)
Basophils Absolute: 0 10*3/uL (ref 0.0–0.1)
Basophils Relative: 1 %
Eosinophils Absolute: 0 10*3/uL (ref 0.0–0.5)
Eosinophils Relative: 1 %
HCT: 28.8 % — ABNORMAL LOW (ref 36.0–46.0)
Hemoglobin: 9 g/dL — ABNORMAL LOW (ref 12.0–15.0)
Immature Granulocytes: 0 %
Lymphocytes Relative: 18 %
Lymphs Abs: 0.4 10*3/uL — ABNORMAL LOW (ref 0.7–4.0)
MCH: 30.5 pg (ref 26.0–34.0)
MCHC: 31.3 g/dL (ref 30.0–36.0)
MCV: 97.6 fL (ref 80.0–100.0)
Monocytes Absolute: 0.3 10*3/uL (ref 0.1–1.0)
Monocytes Relative: 17 %
Neutro Abs: 1.2 10*3/uL — ABNORMAL LOW (ref 1.7–7.7)
Neutrophils Relative %: 63 %
Platelet Count: 186 10*3/uL (ref 150–400)
RBC: 2.95 MIL/uL — ABNORMAL LOW (ref 3.87–5.11)
RDW: 17.5 % — ABNORMAL HIGH (ref 11.5–15.5)
WBC Count: 2 10*3/uL — ABNORMAL LOW (ref 4.0–10.5)
nRBC: 0 % (ref 0.0–0.2)

## 2018-11-25 LAB — CMP (CANCER CENTER ONLY)
ALT: 9 U/L (ref 0–44)
AST: 10 U/L — ABNORMAL LOW (ref 15–41)
Albumin: 3.5 g/dL (ref 3.5–5.0)
Alkaline Phosphatase: 81 U/L (ref 38–126)
Anion gap: 9 (ref 5–15)
BUN: 17 mg/dL (ref 6–20)
CO2: 28 mmol/L (ref 22–32)
Calcium: 9.1 mg/dL (ref 8.9–10.3)
Chloride: 105 mmol/L (ref 98–111)
Creatinine: 0.93 mg/dL (ref 0.44–1.00)
GFR, Est AFR Am: >60 mL/min (ref >60)
GFR, Estimated: >60 mL/min (ref >60)
Glucose, Bld: 103 mg/dL — ABNORMAL HIGH (ref 70–99)
Potassium: 4.2 mmol/L (ref 3.5–5.1)
Sodium: 142 mmol/L (ref 135–145)
Total Bilirubin: <0.2 mg/dL — ABNORMAL LOW (ref 0.3–1.2)
Total Protein: 6.7 g/dL (ref 6.5–8.1)

## 2018-11-25 MED ORDER — OXYCODONE HCL 5 MG PO TABS: 5.0000 mg | ORAL_TABLET | Freq: Four times a day (QID) | ORAL | 0 refills | Status: DC | PRN

## 2018-11-25 MED ORDER — LORAZEPAM 0.5 MG PO TABS: 0.5000 mg | ORAL_TABLET | Freq: Two times a day (BID) | ORAL | 0 refills | Status: DC | PRN

## 2018-11-25 MED FILL — LORazepam 0.5 MG TABS: 0.5 | 15 days supply | Qty: 30 | Fill #0

## 2018-11-25 MED FILL — oxyCODONE HCL 5 MG TABS: 5 | 15 days supply | Qty: 60 | Fill #0

## 2018-11-25 NOTE — Progress Notes (Signed)
[facility] CSW Progress Notes  Met with patient at request of medical oncologist.  Patient has stated that she "feels like she could hurt someone."  CSW met with patient in exam room to explore issues with patient.    Patient reports that she feels irritable/tense when with other people. "I wish they would just leave me alone."  Lives w boyfriend and roommate - boyfriend normally works out of town but has been unable to work recently.  Thus he has been at home more often.  She is currently in chemotherapy and feels fatigued/nauseous - however, both boyfriend and roommate expect that she will continue to cook/clean and have not offered to help.  Is frustrated by "their constant talking and fussing."  Does not like the close living environment, has few skills to deal with her irritability and impulsivity.  Denies current suicidal or homicidal ideation - reports she is frustrated with others, feels isolated and alone. "No one is willing to help me."    Explored consequences of "hurting someone."  Pt voices that "I do not want to do anything stupid, I could go to jail and then I wouldn't get my radiation treatments."  Also is looking forward to visiting with friend who is at her house.  Could possibly go stay with friend for a period of time.  Discussed how to notice when she is becoming overwhelmed and close to losing control.  Pt reports that her breath becomes short/choppy, then she feels her hands poised to strike someone.  Discussed how to walk away when she notices breathing changes, practiced utilizing deep breathing to regain control over actions.    Patient aware that she may need referral to pain control clinic, rates pain at 8/10 especially after exertion.  This is also increasing her irritability.    Oncologist is providing additional support for anxiety management with medications.  Will explore option of pain management clinic referral, depending on status of Medicaid.    CSW will explore options  for patient including status of her disability application - these funds would allow her to move into her own place rather than sharing crowded living quarters, contact DSS to determine status of her Medicaid, link patient with either outpatient mental health services or PCP for additional behavioral health support.  Patient agreeable to this plan. CSW will follow up with patient by phone w additional resources.    CSW spoke w [facility] - they will investigate status of disability claim and determine whether additional records are required for determination.  [facility] to investigate options for behavioral health support.  Will call [employer] to see about status of Medicaid.   , LCSW Clinical Social Worker Phone:  [phone]  

## 2018-11-25 NOTE — Progress Notes (Signed)
[facility]   [phone] [phone]   Clinic Follow up Note   Patient Care Team: Gildardo Pounds, NP as PCP - General (Nurse Practitioner)  Date of Service:  11/25/2018  CHIEF COMPLAINT: F/u of Small Cell Lung Cancer  SUMMARY OF ONCOLOGIC HISTORY: Oncology History Overview Note  Cancer Staging Small cell lung cancer, left upper lobe ([place]) Staging form: Lung, AJCC 8th Edition - Clinical stage from 08/24/2018: Stage IIIA (cT1a, cN2, cM0) - Signed by [name], MD on 10/17/2018    Small cell lung cancer, left upper lobe ([place])  07/29/2018 Imaging   CT Angio Chest 07/29/18  IMPRESSION: 1. Malignant appearing left mediastinal and hilar lymphadenopathy narrowing the airways at the left hilum. In the left lung only a subtle 9 mm spiculated nodule is identified in the lingula. Consider small cell carcinoma. Bronchoscopy or mediastinal node sampling might be most valuable for diagnosis. 2. Indeterminate although low-density (which typically which indicates a benign adenoma) left adrenal nodule. 3.  Negative for acute pulmonary embolus.   08/24/2018 Definitive Surgery   Bronchoscopy and EBUS biopsy on 08/24/18 by Dr. Loanne Drilling   08/24/2018 Initial Biopsy   Diagnosis 08/24/18 FINE NEEDLE ASPIRATION, ENDOSCOPIC, (EBUS) STATION # 7 LYMPH NODE(SPECIMEN 1 OF 4 COLLECTED 08/24/18): NO MALIGNANT CELLS IDENTIFIED.  FINE NEEDLE ASPIRATION, ENDOSCOPIC, (EBUS) LEFT HILAR LYMPH NODE(SPECIMEN 2 OF 4 COLLECTED 08/24/18): NO MALIGNANT CELLS IDENTIFIED.  TRANSBRONCHIAL NEEDLE ASPIRATION, WANG, (SPECIMEN 3 OF 4 COLLECTED 08/24/18): NO MALIGNANT CELLS IDENTIFIED.  BRONCHIAL BRUSHING, LUL (SPECIMEN 4 OF 4 COLLECTED 08/24/18): MALIGNANT CELLS PRESENT, MOST CONSISTENT WITH SMALL CELL CARCINOMA. SEE COMMENT.   08/24/2018 Cancer Staging   Staging form: Lung, AJCC 8th Edition - Clinical stage from 08/24/2018: Stage IIIA (cT1a, cN2, cM0) - Signed by [name], MD on 10/17/2018  08/26/2018 Initial Diagnosis   Small cell lung cancer, left ([place])   08/26/2018 Imaging   CT AP W Contrast 08/26/18  IMPRESSION: 1. 2.9 cm left adrenal nodule cannot be definitively characterized on this study. Potentially a lipid poor adenoma, but metastatic disease not excluded. MRI of the abdomen with and without contrast may prove helpful to further evaluate. 2. No other findings to suggest metastatic disease in the abdomen/pelvis. 3. Left base atelectasis with small left pleural effusion.   08/27/2018 Imaging   MRI brain 08/27/18  IMPRESSION: 1. Motion degraded, incomplete examination. 2. Subcentimeter focus of diffusion signal abnormality in the left cerebellum. No enhancement is evident to strongly suggest a metastasis, and this may reflect an acute to subacute small vessel infarct. 3. No evidence of intracranial metastatic disease elsewhere. 4. Given motion artifact and incomplete postcontrast imaging, consider short-term follow-up MRI (possibly with sedation) to further evaluate the left cerebellar abnormality and provide a more thorough evaluation for metastatic disease.   09/02/2018 PET scan   IMPRESSION: 1. Hypermetabolic left hilar mass is identified encasing the left mainstem bronchus resulting in postobstructive atelectasis in the left upper lobe. 2. Hypermetabolic left mediastinal nodal metastasis. 3. Loculated left pleural effusion is identified with mild increased FDG uptake. Cannot rule out malignant pleural effusion. 4. No evidence for metastatic disease to the abdomen or pelvis or skeletal structures.     09/07/2018 - 11/10/2018 Chemotherapy   Concurrent chemoRT with IV cisplatin on day 1 and etopside day 1-3 for every 3 weeks starting 8/11. Will start radiation with Dr. Lisbeth Renshaw with cycle 2. She completed 4 cycles on 11/10/18   09/14/2018 Procedure   Thoracentesis yielding 750 mL   09/28/2018 -  12/03/2018 Radiation Therapy   Concurrent chemoRT with Dr. Lisbeth Renshaw.  Start with cycle 2 chemo on 09/28/18 and completed on 12/03/18.       CURRENT THERAPY:  Complete RT on 11/6   INTERVAL HISTORY:  Marissa [name] is here for a follow up and treatment. She presents to the clinic alone. She notes lately her tolerance level has been decreased from worsening anxiety and irritation. She does not want to be around people as this triggers this feeling. She has not felt this strongly before and she feels she has it in her to hurt someone. She denies any self inflicting self harm, she is unsure of depressive feelings. She notes she does not like to feel this way. She is not sure about Psychiatry or talking to someone. She use to be on antidepressants before and made her feel worse. She does not want to try again. She also notes having skin rash of upper arms.   She notes from a cancer standpoint she is recovering well from chemo and is tolerating Radiation. She denies fever or chills. She notes low appetite. She notes in order to eat she has needed to smoke marijuana as needed. She denies use of any other drugs.   She notes she still has pain under her left breast. She has been taking Gabapentin 300mg  in the AM and 600mg  in the PM. She uses MS Contin BID. She takes Oxycodone 6-8 tablets a day which she uses in between her morphine.  She notes she has tooth abscess which has been causing her significant pain. She has also used Tylenol for this. She is still able to swallow.  She notes 1-2 episodes of blood last week when she coughed. She notes this has not recurred since then.    REVIEW OF SYSTEMS:   Constitutional: Denies fevers, chills or abnormal weight loss (+) Low appetite  Eyes: Denies blurriness of vision Ears, nose, mouth, throat, and face: Denies mucositis or sore throat Respiratory: Denies cough, dyspnea or wheezes Cardiovascular: Denies palpitation, chest discomfort or lower extremity swelling Gastrointestinal:  Denies nausea, heartburn or change in bowel habits  Skin: (+) Mild skin rash of upper arms MSK: (+) Pain under left breast  Lymphatics: Denies new lymphadenopathy or easy bruising Neurological:Denies numbness, tingling or new weaknesses Behavioral/Psych: Mood is stable, no new changes (+) Worsened anxiety   All other systems were reviewed with the patient and are negative.  MEDICAL HISTORY:  Past Medical History:  Diagnosis Date  . Asthma   . Chronic headaches   . COPD (chronic obstructive pulmonary disease) ([place])   . Depression   . Heart murmur   . Small cell lung cancer ([place])     SURGICAL HISTORY: Past Surgical History:  Procedure Laterality Date  . BRONCHIAL BIOPSY  08/24/2018   Procedure: BRONCHIAL BIOPSIES;  Surgeon: Margaretha Seeds, MD;  Location: [facility];  Service: Cardiopulmonary;;  . BRONCHIAL BRUSHINGS  08/24/2018   Procedure: BRONCHIAL BRUSHINGS;  Surgeon: Margaretha Seeds, MD;  Location: [facility];  Service: Cardiopulmonary;;  . BRONCHIAL NEEDLE ASPIRATION BIOPSY  08/24/2018   Procedure: BRONCHIAL NEEDLE ASPIRATION BIOPSIES;  Surgeon: Margaretha Seeds, MD;  Location: [facility];  Service: Cardiopulmonary;;  . ENDOBRONCHIAL ULTRASOUND N/A 08/24/2018   Procedure: ENDOBRONCHIAL ULTRASOUND;  Surgeon: Margaretha Seeds, MD;  Location: [facility];  Service: Cardiopulmonary;  Laterality: N/A;  . IR THORACENTESIS ASP PLEURAL SPACE W/IMG GUIDE  09/14/2018  . TUBAL LIGATION    . VIDEO BRONCHOSCOPY  08/24/2018  Procedure: VIDEO BRONCHOSCOPY;  Surgeon: Margaretha Seeds, MD;  Location: [facility];  Service: Cardiopulmonary;;    I have reviewed the social history and family history with the patient and they are unchanged from previous note.  ALLERGIES:  is allergic to penicillins.  MEDICATIONS:  Current Outpatient Medications  Medication Sig Dispense Refill  . baclofen (LIORESAL) 10 MG tablet Take 1 tablet (10 mg total) by mouth 3 (three) times daily as needed for muscle spasms. 30 each 0  . clindamycin  (CLEOCIN) 150 MG capsule Take 2 capsules immediately then 1 capsule three (3) times per day until gone 22 capsule 0  . gabapentin (NEURONTIN) 300 MG capsule Take 1 tab in morning and 2 tabs at bedtime 90 capsule 0  . loratadine (CLARITIN) 10 MG tablet Take 10 mg by mouth daily as needed for allergies.    Marland Kitchen morphine (MS CONTIN) 30 MG 12 hr tablet Take 1 tablet (30 mg total) by mouth every 12 (twelve) hours. 60 tablet 0  . nystatin (MYCOSTATIN/NYSTOP) powder Apply topically 4 (four) times daily. 15 g 1  . ondansetron (ZOFRAN) 8 MG tablet Take 1 tablet (8 mg total) by mouth 2 (two) times daily as needed. Start on the third day after cisplatin chemotherapy. (Patient taking differently: Take 8 mg by mouth 2 (two) times daily as needed for nausea or vomiting. Start on the third day after cisplatin chemotherapy.) 30 tablet 1  . oxyCODONE (OXY IR/ROXICODONE) 5 MG immediate release tablet Take 1 tablet (5 mg total) by mouth every 6 (six) hours as needed for severe pain. 60 tablet 0  . potassium chloride SA (K-DUR) 20 MEQ tablet Take 1 tablet (20 mEq total) by mouth daily. 30 tablet 1  . PRESCRIPTION MEDICATION Take 1 tablet by mouth as needed (anxiety/sleep).    . prochlorperazine (COMPAZINE) 10 MG tablet Take 1 tablet (10 mg total) by mouth every 6 (six) hours as needed (Nausea or vomiting). 30 tablet 1  . sucralfate (CARAFATE) 1 g tablet Take 1 tablet (1 g total) by mouth 4 (four) times daily. 120 tablet 2  . LORazepam (ATIVAN) 0.5 MG tablet Take 1 tablet (0.5 mg total) by mouth every 12 (twelve) hours as needed for anxiety. 30 tablet 0   No current facility-administered medications for this visit.     PHYSICAL EXAMINATION: ECOG PERFORMANCE STATUS: 2 - Symptomatic, <50% confined to bed  Vitals:   11/25/18 1425  BP: 126/65  Pulse: 96  Resp: 18  Temp: 98.8 F (37.1 C)  SpO2: 98%   Filed Weights   11/25/18 1425  Weight: 166 lb (75.3 kg)    GENERAL:alert, no distress and comfortable SKIN: skin  color, texture, turgor are normal (+) Mild skin rash of forearms  EYES: normal, Conjunctiva are pink and non-injected, sclera clear  NECK: supple, thyroid normal size, non-tender, without nodularity LYMPH:  no palpable lymphadenopathy in the cervical, axillary  LUNGS: clear to auscultation and percussion with normal breathing effort HEART: regular rate & rhythm and no murmurs and no lower extremity edema ABDOMEN:abdomen soft, non-tender and normal bowel sounds Musculoskeletal:no cyanosis of digits and no clubbing  NEURO: alert & oriented x 3 with fluent speech, no focal motor/sensory deficits  LABORATORY DATA:  I have reviewed the data as listed CBC Latest Ref Rng & Units 11/25/2018 11/10/2018 11/01/2018  WBC 4.0 - 10.5 K/uL 2.0(L) 3.7(L) 3.4(L)  Hemoglobin 12.0 - 15.0 g/dL 9.0(L) 12.8 11.2(L)  Hematocrit 36.0 - 46.0 % 28.8(L) 40.1 35.6(L)  Platelets 150 -  400 K/uL 186 319 162     CMP Latest Ref Rng & Units 11/25/2018 11/10/2018 11/01/2018  Glucose 70 - 99 mg/dL 103(H) 111(H) 91  BUN 6 - 20 mg/dL 17 12 14   Creatinine 0.44 - 1.00 mg/dL 0.93 0.75 0.74  Sodium 135 - 145 mmol/L 142 143 142  Potassium 3.5 - 5.1 mmol/L 4.2 3.7 3.5  Chloride 98 - 111 mmol/L 105 106 104  CO2 22 - 32 mmol/L 28 25 30   Calcium 8.9 - 10.3 mg/dL 9.1 9.4 9.9  Total Protein 6.5 - 8.1 g/dL 6.7 7.5 7.3  Total Bilirubin 0.3 - 1.2 mg/dL <0.2(L) <0.2(L) <0.2(L)  Alkaline Phos 38 - 126 U/L 81 87 100  AST 15 - 41 U/L 10(L) 12(L) 10(L)  ALT 0 - 44 U/L 9 11 10       RADIOGRAPHIC STUDIES: I have personally reviewed the radiological images as listed and agreed with the findings in the report. No results found.   ASSESSMENT & PLAN:  Marissa [name] is a 53 y.o. female with   1.Small cell lung cancer, stage III, limited stage  -Recently diagnosed in 07/2018. Imaging showed 28mm nodule of left lung with malignant appearing left mediastinal and hilar lymphadenopathy. Her Bronchoscopy biopsy confirmed mass to be small cell  lung cancer while her left hilar andmediastinal lymph node biopsy were negative for malignant cells.  -CT AP and MRI brain negative for distant metastasis.  -PET scan from 09/02/18 shows Hypermetabolic left mediastinal nodal metastasis, but no distant metastasis.  -Given her locally advanced disease (stage III cancer). Standard treatment includes concurrent chemoRT. On 09/07/18 I started on with IV chemo cisplatin on day 1 and etopside day 1-3 for every 3 weeks which she completed 4 cycles on 11/10/18. She started radiation with Dr. Lisbeth Renshaw with cycle 2 chemo on 09/28/18. Plan for final Radiation treatment on 12/03/18.   -She is recovering well from chemo and tolerating RT well. She still has pain under her left breast. She also had mild blood production from cough twice last week. I discussed this could be from RT. Physical exam benign. Labs reviewed, CBC and CMP WNL except WBC 2, HG 9, ANC 1.2, BG 103, AST 10. LDH still pending.  -To monitor her mental status will f/u virtually next week. F/u in person in 4 weeks.  -plan to repeat staging CT scan in about 3 months   2. Smoking Cessation, COPD, H/o acute left tension pneumothorax, resolved -She has a h/o heavy smoking.  -She has COPD. She had recent execration with acute left tension pneumothorax in 07/2018.  -She underwent bronchoscopy and EBUS biopsy on 7/28 and wasadmitted after procedure for pneumothorax. Now resolved.  -She was previously on continuous canula oxygen, her breathing has improved.   3. Severe LUQ and left flank pain  -She had chest tube placed on left upper back S/p removal she has had severe radiating LUQ pain.  -Given this could be nerve damage causing her pain, on 09/03/18 I called in gabapentin 300mg . She can titrate up to TID if tolerable. -I previously discussed with long term use she will become dependent at this frequency, she understands. I previously referred her to Dr. Donell Sievert office for pain management. She can not be  seen by Dr. Maryjean Ka office until her Medicaid is approved which is pending, I checked with SW last week. [facility] financial aid is handling this.  -Since completing chemo she mainly has pain in under left breast and has recent teeth abscess. She takes MS  Contin 15mg  BID and Oxycodone 5mg  6-8 tablets a day in between long acting.  -She will continue Gabapentin 300mg  in the AM and 600mg  in the PM.  -I discussed her use of oxycodone is moderate to high dose. I discussed starting to reduce and ween her down from Oxycodone. She understands. I will refill her Oxycodone today (11/25/18). I also highly advised she keep her medications locked up. She agrees.   4. Iron deficient anemia  -Developed during recent hospitalization.  -Treated with IV Iron on 7/3 and 7/31. Anemia has improved  -Will monitor. Hg at 9 today (11/25/18)  5. Nausea, vomiting, poor appetite, secondary to chemo  -Since she completed chemo her N&V have resolved  -She notes still having a low appetite. She has not gained back the weight she lost since cancer diagnosis.  -She has been taking marijuana as needed to help her taste and help her eat.   6. Hypokalemia  -normalized on K supplement, continue   7. Anxiety/Depression, irritability  -She has a h/o of anxiety and depression and was previously on antidepressants but did not tolerate well. She feels it made her worse and does not want to try again -She notes this week her tolerance level has been high from irritation and anxiety triggered by being around people. She feels she has it in herself to hurt others but denies ideation of suicidal.  -She notes she uses Marijuana to help her eat, but this does not irritate her. She is on Moderate to high dose oxycodone. She denies any other use of recreational drugs.  -I discussed option of anxiety medication such as Xanax or Ativan. She is interested, I will call in Ativan BID (11/25/18). I advised her not to take with Oxycodone or MS Contin.   -She does not need help sleeping while on Gabapentin 600mg  nightly.  -I asked our SW Vicente Males to speak with her after her visit with me  -will f/u her next week    PLAN:  -I called in Ativan #30 tablets today for her worsening anxiety and irritability, and refilled oxycodone, she will try to reduce her oxycodone amount, she knows she can take oxycodone 5 mg no more than 4 tabs a day  -Continue RT, plan to complete on 11/6 -phone visit in 1 week  -Lab and f/u in 4 weeks    No problem-specific Assessment & Plan notes found for this encounter.   No orders of the defined types were placed in this encounter.  All questions were answered. The patient knows to call the clinic with any problems, questions or concerns. No barriers to learning was detected. I spent 30 minutes counseling the patient face to face. The total time spent in the appointment was 40 minutes and more than 50% was on counseling and review of test results     [name], MD 11/25/2018   I, Joslyn Devon, am acting as scribe for [name], MD.   I have reviewed the above documentation for accuracy and completeness, and I agree with the above.

## 2018-11-26 ENCOUNTER — Telehealth: Payer: Self-pay | Admitting: Hematology

## 2018-11-26 ENCOUNTER — Ambulatory Visit: Payer: Medicaid Other

## 2018-11-26 ENCOUNTER — Encounter: Payer: Self-pay | Admitting: General Practice

## 2018-11-26 ENCOUNTER — Other Ambulatory Visit: Payer: Self-pay

## 2018-11-26 ENCOUNTER — Telehealth: Payer: Self-pay | Admitting: *Deleted

## 2018-11-26 ENCOUNTER — Ambulatory Visit
Admission: RE | Admit: 2018-11-26 | Discharge: 2018-11-26 | Disposition: A | Discharge status: Home / Self Care | Payer: Medicaid Other | Source: Ambulatory Visit | Attending: Radiation Oncology | Admitting: Radiation Oncology

## 2018-11-26 DIAGNOSIS — Z510 Encounter for antineoplastic radiation therapy: Secondary | ICD-10-CM | POA: Diagnosis not present

## 2018-11-26 NOTE — Telephone Encounter (Signed)
[facility]/Dr Maryjean Ka & left message requesting scheduling consult since pt has medicaid now per Dr Burr Medico.

## 2018-11-26 NOTE — Progress Notes (Signed)
Marissa [name] Progress Notes  Called patient - provided information that per [facility] her Medicaid has been approved 9/1/ - 09/27/2018.  Advised that I spoke w [facility] re her SSI application - they continue to work on it and have asked that she contact them to get regular updates on progress.  Gave her their contact information so she can reach [facility].  Advised that I have spoken w [place], her PCP - RN CM Eden Lathe has asked their behavioral health clinician to determine if their support for mental health needs would be appropriate.  [name] also discussed referral to outside mental health providers.  Referred to [facility] [phone]), referral information sent to practice.  Appointment scheduled w psychiatrist Dr Darleene Cleaver on Nov 20 at 10 AM.  Appointment with therapy on Nov 10 at 1 PM.  Patient advised by phone and given full details of practice location, contact information, time/date of appointment.  Marissa Shell, LCSW Clinical Social Worker Phone:  [phone] Cell:  [phone]

## 2018-11-26 NOTE — Telephone Encounter (Signed)
Scheduled appt per 10/29 los.,  Spoke with pt and she is aware of her appt date and time.

## 2018-11-28 ENCOUNTER — Other Ambulatory Visit: Payer: Self-pay | Admitting: Hematology

## 2018-11-28 DIAGNOSIS — C3412 Malignant neoplasm of upper lobe, left bronchus or lung: Secondary | ICD-10-CM

## 2018-11-28 DIAGNOSIS — F329 Major depressive disorder, single episode, unspecified: Secondary | ICD-10-CM

## 2018-11-28 DIAGNOSIS — F32A Depression, unspecified: Secondary | ICD-10-CM

## 2018-11-28 DIAGNOSIS — F419 Anxiety disorder, unspecified: Secondary | ICD-10-CM

## 2018-11-29 ENCOUNTER — Telehealth: Payer: Self-pay

## 2018-11-29 ENCOUNTER — Ambulatory Visit: Payer: Medicaid Other

## 2018-11-29 ENCOUNTER — Other Ambulatory Visit: Payer: Self-pay

## 2018-11-29 ENCOUNTER — Ambulatory Visit
Admission: RE | Admit: 2018-11-29 | Discharge: 2018-11-29 | Disposition: A | Discharge status: Home / Self Care | Payer: Medicaid Other | Source: Ambulatory Visit | Attending: Radiation Oncology | Admitting: Radiation Oncology

## 2018-11-29 DIAGNOSIS — Z510 Encounter for antineoplastic radiation therapy: Secondary | ICD-10-CM | POA: Diagnosis present

## 2018-11-29 DIAGNOSIS — C3412 Malignant neoplasm of upper lobe, left bronchus or lung: Secondary | ICD-10-CM | POA: Diagnosis not present

## 2018-11-29 NOTE — Telephone Encounter (Signed)
Faxed Demographics, Medicaid information and OV note from 11/25/2018 to Dr. Clydell Hakim for pain management at fax [phone]

## 2018-11-29 NOTE — Telephone Encounter (Signed)
Received notification that patient has upcoming appointment for 11/20 at 10:00 am at [place] [phone], sent to HIM for scan to chart.

## 2018-11-30 ENCOUNTER — Ambulatory Visit
Admission: RE | Admit: 2018-11-30 | Discharge: 2018-11-30 | Disposition: A | Discharge status: Home / Self Care | Payer: Medicaid Other | Source: Ambulatory Visit | Attending: Radiation Oncology | Admitting: Radiation Oncology

## 2018-11-30 ENCOUNTER — Ambulatory Visit: Payer: Medicaid Other

## 2018-11-30 ENCOUNTER — Other Ambulatory Visit: Payer: Self-pay

## 2018-11-30 ENCOUNTER — Telehealth: Payer: Self-pay

## 2018-11-30 DIAGNOSIS — Z510 Encounter for antineoplastic radiation therapy: Secondary | ICD-10-CM | POA: Diagnosis not present

## 2018-11-30 NOTE — Telephone Encounter (Signed)
Patient left VM asking if her appointment with Dr. Burr Medico on 12/02/18 could be moved to after her radiation appointment at 12:45 that same day. Called patient back to let her know that her appointment with Dr. Burr Medico would be a phone visit, not an in-person session. Patient stated if that was the case then she would be able to keep the morning appointment. No other concerns identified at this time.

## 2018-12-01 ENCOUNTER — Telehealth: Payer: Self-pay

## 2018-12-01 ENCOUNTER — Ambulatory Visit: Payer: Medicaid Other

## 2018-12-01 ENCOUNTER — Ambulatory Visit: Admission: RE | Admit: 2018-12-01 | Payer: Medicaid Other | Source: Ambulatory Visit

## 2018-12-01 NOTE — Telephone Encounter (Signed)
Spoke with Ms Shropshire states she is sick has been throwing up all day is not coming in

## 2018-12-01 NOTE — Progress Notes (Signed)
[facility]   Telephone:([phone]) [phone] Fax:(336) [phone]   Clinic Follow up Note   Patient Care Team: Gildardo Pounds, NP as PCP - General (Nurse Practitioner)   I connected with Marissa [name] on 12/02/2018 at  9:20 AM EST by telephone visit and verified that I am speaking with the correct person using two identifiers.  I discussed the limitations, risks, security and privacy concerns of performing an evaluation and management service by telephone and the availability of in person appointments. I also discussed with the patient that there may be a patient responsible charge related to this service. The patient expressed understanding and agreed to proceed.   Other persons participating in the visit and their role in the encounter:  None   Patient's location:  Home  Provider's location:  Office   CHIEF COMPLAINT: F/u of Small Cell Lung Cancer  SUMMARY OF ONCOLOGIC HISTORY: Oncology History Overview Note  Cancer Staging Small cell lung cancer, left upper lobe ([place]) Staging form: Lung, AJCC 8th Edition - Clinical stage from 08/24/2018: Stage IIIA (cT1a, cN2, cM0) - Signed by [name], MD on 10/17/2018    Small cell lung cancer, left upper lobe ([place])  07/29/2018 Imaging   CT Angio Chest 07/29/18  IMPRESSION: 1. Malignant appearing left mediastinal and hilar lymphadenopathy narrowing the airways at the left hilum. In the left lung only a subtle 9 mm spiculated nodule is identified in the lingula. Consider small cell carcinoma. Bronchoscopy or mediastinal node sampling might be most valuable for diagnosis. 2. Indeterminate although low-density (which typically which indicates a benign adenoma) left adrenal nodule. 3.  Negative for acute pulmonary embolus.   08/24/2018 Definitive Surgery   Bronchoscopy and EBUS biopsy on 08/24/18 by Dr. Loanne Drilling   08/24/2018 Initial Biopsy   Diagnosis 08/24/18 FINE NEEDLE ASPIRATION, ENDOSCOPIC, (EBUS) STATION # 7 LYMPH NODE(SPECIMEN 1 OF 4  COLLECTED 08/24/18): NO MALIGNANT CELLS IDENTIFIED.  FINE NEEDLE ASPIRATION, ENDOSCOPIC, (EBUS) LEFT HILAR LYMPH NODE(SPECIMEN 2 OF 4 COLLECTED 08/24/18): NO MALIGNANT CELLS IDENTIFIED.  TRANSBRONCHIAL NEEDLE ASPIRATION, WANG, (SPECIMEN 3 OF 4 COLLECTED 08/24/18): NO MALIGNANT CELLS IDENTIFIED.  BRONCHIAL BRUSHING, LUL (SPECIMEN 4 OF 4 COLLECTED 08/24/18): MALIGNANT CELLS PRESENT, MOST CONSISTENT WITH SMALL CELL CARCINOMA. SEE COMMENT.   08/24/2018 Cancer Staging   Staging form: Lung, AJCC 8th Edition - Clinical stage from 08/24/2018: Stage IIIA (cT1a, cN2, cM0) - Signed by [name], MD on 10/17/2018   08/26/2018 Initial Diagnosis   Small cell lung cancer, left ([place])   08/26/2018 Imaging   CT AP W Contrast 08/26/18  IMPRESSION: 1. 2.9 cm left adrenal nodule cannot be definitively characterized on this study. Potentially a lipid poor adenoma, but metastatic disease not excluded. MRI of the abdomen with and without contrast may prove helpful to further evaluate. 2. No other findings to suggest metastatic disease in the abdomen/pelvis. 3. Left base atelectasis with small left pleural effusion.   08/27/2018 Imaging   MRI brain 08/27/18  IMPRESSION: 1. Motion degraded, incomplete examination. 2. Subcentimeter focus of diffusion signal abnormality in the left cerebellum. No enhancement is evident to strongly suggest a metastasis, and this may reflect an acute to subacute small vessel infarct. 3. No evidence of intracranial metastatic disease elsewhere. 4. Given motion artifact and incomplete postcontrast imaging, consider short-term follow-up MRI (possibly with sedation) to further evaluate the left cerebellar abnormality and provide a more thorough evaluation for metastatic disease.   09/02/2018 PET scan   IMPRESSION: 1. Hypermetabolic left hilar mass is identified encasing the  left mainstem bronchus resulting in postobstructive atelectasis in the left upper lobe. 2. Hypermetabolic  left mediastinal nodal metastasis. 3. Loculated left pleural effusion is identified with mild increased FDG uptake. Cannot rule out malignant pleural effusion. 4. No evidence for metastatic disease to the abdomen or pelvis or skeletal structures.     09/07/2018 - 11/10/2018 Chemotherapy   Concurrent chemoRT with IV cisplatin on day 1 and etopside day 1-3 for every 3 weeks starting 8/11. Will start radiation with Dr. Lisbeth Renshaw with cycle 2. She completed 4 cycles on 11/10/18   09/14/2018 Procedure   Thoracentesis yielding 750 mL   09/28/2018 - 12/03/2018 Radiation Therapy   Concurrent chemoRT with Dr. Lisbeth Renshaw. Start with cycle 2 chemo on 09/28/18 and completed on 12/03/18.       CURRENT THERAPY:  Complete RT on 11/6  INTERVAL HISTORY:  Marissa [name] is here for a follow up. she identified themselves by birth date.  She has developed recurrent nausea and vomiting since yesterday, she was not sure if it is related to what I she ate yesterday.  No abdominal pain, diarrhea, fever, or other new symptoms.  She has not been able to drink fluids, and still eats something.  She has mild dizziness this morning, no chest discomfort.  She missed her radiation yesterday due to the nausea and vomiting, and is not sure if she is able to come in today.  Her mood and irritability has much improved since last week, she been taking Ativan as needed, which she thinks it helps a lot.  The rest of review of system were negative.   MEDICAL HISTORY:  Past Medical History:  Diagnosis Date  . Asthma   . Chronic headaches   . COPD (chronic obstructive pulmonary disease) ([place])   . Depression   . Heart murmur   . Small cell lung cancer ([place])     SURGICAL HISTORY: Past Surgical History:  Procedure Laterality Date  . BRONCHIAL BIOPSY  08/24/2018   Procedure: BRONCHIAL BIOPSIES;  Surgeon: Margaretha Seeds, MD;  Location: [facility];  Service: Cardiopulmonary;;  . BRONCHIAL BRUSHINGS  08/24/2018   Procedure: BRONCHIAL  BRUSHINGS;  Surgeon: Margaretha Seeds, MD;  Location: [facility];  Service: Cardiopulmonary;;  . BRONCHIAL NEEDLE ASPIRATION BIOPSY  08/24/2018   Procedure: BRONCHIAL NEEDLE ASPIRATION BIOPSIES;  Surgeon: Margaretha Seeds, MD;  Location: [facility];  Service: Cardiopulmonary;;  . ENDOBRONCHIAL ULTRASOUND N/A 08/24/2018   Procedure: ENDOBRONCHIAL ULTRASOUND;  Surgeon: Margaretha Seeds, MD;  Location: [facility];  Service: Cardiopulmonary;  Laterality: N/A;  . IR THORACENTESIS ASP PLEURAL SPACE W/IMG GUIDE  09/14/2018  . TUBAL LIGATION    . VIDEO BRONCHOSCOPY  08/24/2018   Procedure: VIDEO BRONCHOSCOPY;  Surgeon: Margaretha Seeds, MD;  Location: [facility];  Service: Cardiopulmonary;;    I have reviewed the social history and family history with the patient and they are unchanged from previous note.  ALLERGIES:  is allergic to penicillins.  MEDICATIONS:  Current Outpatient Medications  Medication Sig Dispense Refill  . baclofen (LIORESAL) 10 MG tablet Take 1 tablet (10 mg total) by mouth 3 (three) times daily as needed for muscle spasms. 30 each 0  . clindamycin (CLEOCIN) 150 MG capsule Take 2 capsules immediately then 1 capsule three (3) times per day until gone 22 capsule 0  . gabapentin (NEURONTIN) 300 MG capsule Take 1 tab in morning and 2 tabs at bedtime 90 capsule 0  . loratadine (CLARITIN) 10 MG tablet Take 10 mg by mouth  daily as needed for allergies.    Marland Kitchen LORazepam (ATIVAN) 0.5 MG tablet Take 1 tablet (0.5 mg total) by mouth every 12 (twelve) hours as needed for anxiety. 30 tablet 0  . morphine (MS CONTIN) 30 MG 12 hr tablet Take 1 tablet (30 mg total) by mouth every 12 (twelve) hours. 60 tablet 0  . nystatin (MYCOSTATIN/NYSTOP) powder Apply topically 4 (four) times daily. 15 g 1  . ondansetron (ZOFRAN) 8 MG tablet Take 1 tablet (8 mg total) by mouth 2 (two) times daily as needed. Start on the third day after cisplatin chemotherapy. (Patient taking differently: Take 8 mg by  mouth 2 (two) times daily as needed for nausea or vomiting. Start on the third day after cisplatin chemotherapy.) 30 tablet 1  . oxyCODONE (OXY IR/ROXICODONE) 5 MG immediate release tablet Take 1 tablet (5 mg total) by mouth every 6 (six) hours as needed for severe pain. 60 tablet 0  . potassium chloride SA (K-DUR) 20 MEQ tablet Take 1 tablet (20 mEq total) by mouth daily. 30 tablet 1  . PRESCRIPTION MEDICATION Take 1 tablet by mouth as needed (anxiety/sleep).    . prochlorperazine (COMPAZINE) 10 MG tablet Take 1 tablet (10 mg total) by mouth every 6 (six) hours as needed (Nausea or vomiting). 30 tablet 1  . sucralfate (CARAFATE) 1 g tablet Take 1 tablet (1 g total) by mouth 4 (four) times daily. 120 tablet 2   No current facility-administered medications for this visit.     PHYSICAL EXAMINATION: ECOG PERFORMANCE STATUS: 3 - Symptomatic, >50% confined to bed  No vitals taken today, Exam not performed today   LABORATORY DATA:  I have reviewed the data as listed CBC Latest Ref Rng & Units 11/25/2018 11/10/2018 11/01/2018  WBC 4.0 - 10.5 K/uL 2.0(L) 3.7(L) 3.4(L)  Hemoglobin 12.0 - 15.0 g/dL 9.0(L) 12.8 11.2(L)  Hematocrit 36.0 - 46.0 % 28.8(L) 40.1 35.6(L)  Platelets 150 - 400 K/uL 186 319 162     CMP Latest Ref Rng & Units 11/25/2018 11/10/2018 11/01/2018  Glucose 70 - 99 mg/dL 103(H) 111(H) 91  BUN 6 - 20 mg/dL 17 12 14   Creatinine 0.44 - 1.00 mg/dL 0.93 0.75 0.74  Sodium 135 - 145 mmol/L 142 143 142  Potassium 3.5 - 5.1 mmol/L 4.2 3.7 3.5  Chloride 98 - 111 mmol/L 105 106 104  CO2 22 - 32 mmol/L 28 25 30   Calcium 8.9 - 10.3 mg/dL 9.1 9.4 9.9  Total Protein 6.5 - 8.1 g/dL 6.7 7.5 7.3  Total Bilirubin 0.3 - 1.2 mg/dL <0.2(L) <0.2(L) <0.2(L)  Alkaline Phos 38 - 126 U/L 81 87 100  AST 15 - 41 U/L 10(L) 12(L) 10(L)  ALT 0 - 44 U/L 9 11 10       RADIOGRAPHIC STUDIES: I have personally reviewed the radiological images as listed and agreed with the findings in the report. No  results found.   ASSESSMENT & PLAN:  Marissa [name] is a 53 y.o. female with   2. Nausea and vomiting -It started yesterday, he has normal bowel movement, no abdominal pain, fever or other new -I encouraged her to drink more fluids, and come in for IV fluids today.  She states she may not be able to command for radiation today, if not, she is agreeable to get IV fluids tomorrow. I will arrange it  -I encouraged her to use Zofran and Compazine as needed for nausea  2. Agitation and irritability, anxiety  -She has baseline anxiety, denies depression.  She became very agitated and irritable last week, and stated she may hurt someone else  -Much improved after taking Ativan as needed -We have referred her to  [facility], she will call them back and schedule her appointment   3.Small cell lung cancer, stage III, limited stage  -she completed concurrent chemoRT  -plan to repeat staging CT scan in about 3 months      PLAN: -I recommend her to take zofran and compazine alternatively for nausea, and drink more fluids  -will arrange IVF today or tomorrow  -f/u in 2 weeks    No problem-specific Assessment & Plan notes found for this encounter.   No orders of the defined types were placed in this encounter.  I discussed the assessment and treatment plan with the patient. The patient was provided an opportunity to ask questions and all were answered. The patient agreed with the plan and demonstrated an understanding of the instructions.  The patient was advised to call back or seek an in-person evaluation if the symptoms worsen or if the condition fails to improve as anticipated.  I provided 15 minutes of non face-to-face telephone visit time during this encounter, and > 50% was spent counseling as documented under my assessment & plan.    [name], MD 12/02/2018   I, Joslyn Devon, am acting as scribe for [name], MD.   I have reviewed the above documentation for  accuracy and completeness, and I agree with the above.

## 2018-12-02 ENCOUNTER — Ambulatory Visit: Payer: Medicaid Other

## 2018-12-02 ENCOUNTER — Telehealth: Payer: Self-pay

## 2018-12-02 ENCOUNTER — Inpatient Hospital Stay: Payer: Medicaid Other | Attending: Hematology | Admitting: Hematology

## 2018-12-02 ENCOUNTER — Encounter: Payer: Self-pay | Admitting: Hematology

## 2018-12-02 DIAGNOSIS — C3412 Malignant neoplasm of upper lobe, left bronchus or lung: Secondary | ICD-10-CM | POA: Diagnosis not present

## 2018-12-02 NOTE — Telephone Encounter (Signed)
Spoke with patient per Dr. Burr Medico was given an appointment for this afternoon for IV fluids.  She declines for today and wants for tomorrow.  I have arranged this at 3:00 pm spoke with patient and explained that she would feel better if she got fluids today, still declined wanting visit tomorrow.

## 2018-12-03 ENCOUNTER — Ambulatory Visit: Payer: Medicaid Other

## 2018-12-03 ENCOUNTER — Telehealth: Payer: Self-pay | Admitting: Hematology

## 2018-12-03 NOTE — Telephone Encounter (Signed)
No los per 11/5.

## 2018-12-06 ENCOUNTER — Telehealth: Payer: Self-pay

## 2018-12-06 ENCOUNTER — Ambulatory Visit: Payer: Medicaid Other

## 2018-12-06 NOTE — Telephone Encounter (Signed)
Patient called back states she can not come today she has other appointments all day L3 notified

## 2018-12-06 NOTE — Telephone Encounter (Signed)
Patient states that she is scared to take the Morphine anymore.  Last week on Thursday after taking it she had vomiting and chills, highest temperature got was 99.  This lasted all the way until Saturday.  I explained to her that she has been taking the Morphine for over a month at 30 mg q 12 hours and this was unlikely related to this. Even after reassuring her she does not want to take it.  She would like a refill on the Oxycodone.  I explained I would speak with Dr. Burr Medico and call her back.

## 2018-12-06 NOTE — Telephone Encounter (Signed)
Spoke with the patient and gave her times for her last 3 treatments.

## 2018-12-07 ENCOUNTER — Telehealth: Payer: Self-pay | Admitting: Licensed Clinical Social Worker

## 2018-12-07 ENCOUNTER — Ambulatory Visit: Payer: Medicaid Other

## 2018-12-07 ENCOUNTER — Other Ambulatory Visit: Payer: Self-pay | Admitting: Hematology

## 2018-12-07 ENCOUNTER — Ambulatory Visit
Admission: RE | Admit: 2018-12-07 | Discharge: 2018-12-07 | Disposition: A | Discharge status: Home / Self Care | Payer: Medicaid Other | Source: Ambulatory Visit | Attending: Radiation Oncology | Admitting: Radiation Oncology

## 2018-12-07 DIAGNOSIS — M25512 Pain in left shoulder: Secondary | ICD-10-CM

## 2018-12-07 DIAGNOSIS — C3492 Malignant neoplasm of unspecified part of left bronchus or lung: Secondary | ICD-10-CM

## 2018-12-07 DIAGNOSIS — Z510 Encounter for antineoplastic radiation therapy: Secondary | ICD-10-CM | POA: Diagnosis not present

## 2018-12-07 MED ORDER — OXYCODONE HCL 5 MG PO TABS: 5.0000 mg | ORAL_TABLET | ORAL | 0 refills | Status: DC | PRN

## 2018-12-07 MED FILL — oxyCODONE HCL 5 MG TABS: 5 | 14 days supply | Qty: 84 | Fill #0

## 2018-12-07 NOTE — Telephone Encounter (Signed)
Notified of message below

## 2018-12-07 NOTE — Telephone Encounter (Signed)
I have refilled her oxycodone 5mg , and changed it from q6h to q4h as needed for pain, I removed MS contin from her medication list and will not refill that. I called in to [facility], please let pt know, thanks  [name] MD

## 2018-12-07 NOTE — Telephone Encounter (Signed)
Call placed to patient to follow up on behavioral health referral. LCSW left message requesting a return call.

## 2018-12-08 ENCOUNTER — Ambulatory Visit
Admission: RE | Admit: 2018-12-08 | Discharge: 2018-12-08 | Disposition: A | Discharge status: Home / Self Care | Payer: Medicaid Other | Source: Ambulatory Visit | Attending: Radiation Oncology | Admitting: Radiation Oncology

## 2018-12-08 ENCOUNTER — Other Ambulatory Visit: Payer: Self-pay

## 2018-12-08 ENCOUNTER — Ambulatory Visit: Payer: Medicaid Other

## 2018-12-08 DIAGNOSIS — Z510 Encounter for antineoplastic radiation therapy: Secondary | ICD-10-CM | POA: Diagnosis not present

## 2018-12-09 ENCOUNTER — Ambulatory Visit
Admission: RE | Admit: 2018-12-09 | Discharge: 2018-12-09 | Disposition: A | Discharge status: Home / Self Care | Payer: Medicaid Other | Source: Ambulatory Visit | Attending: Radiation Oncology | Admitting: Radiation Oncology

## 2018-12-09 ENCOUNTER — Other Ambulatory Visit: Payer: Self-pay

## 2018-12-09 ENCOUNTER — Encounter: Payer: Self-pay | Admitting: Radiation Oncology

## 2018-12-09 DIAGNOSIS — Z510 Encounter for antineoplastic radiation therapy: Secondary | ICD-10-CM | POA: Diagnosis not present

## 2018-12-10 ENCOUNTER — Encounter: Payer: Self-pay | Admitting: General Practice

## 2018-12-10 NOTE — Progress Notes (Signed)
[facility] CSW Progress Notes  Call to check on patient, states that she was unable to make 11/10 appt w [facility] due to transportation lack.  Wants to for [facility] but has not gotten the application yet.  CSW clarified that per Kris Mouton, [facility] [employer] Advocate, her Medicaid has been approved thus she should be able to call DSS and schedule  Medicaid transport as they will be able to look her up in their system.  Pt states she has informed [place] of her transportation difficulties and is keeping up w appointments and cancelling as needed.  Otherwise, she is "doing well."    Edwyna Shell, [place] Worker Phone:  [phone]

## 2018-12-15 ENCOUNTER — Telehealth: Payer: Self-pay

## 2018-12-15 ENCOUNTER — Other Ambulatory Visit: Payer: Self-pay | Admitting: Hematology

## 2018-12-15 DIAGNOSIS — M25512 Pain in left shoulder: Secondary | ICD-10-CM

## 2018-12-15 DIAGNOSIS — C3492 Malignant neoplasm of unspecified part of left bronchus or lung: Secondary | ICD-10-CM

## 2018-12-15 MED ORDER — DULOXETINE HCL 20 MG PO CPEP: 20.0000 mg | ORAL_CAPSULE | Freq: Every day | ORAL | 0 refills | Status: DC

## 2018-12-15 MED ORDER — OXYCODONE HCL 5 MG PO TABS: 5.0000 mg | ORAL_TABLET | ORAL | 0 refills | Status: DC | PRN

## 2018-12-15 MED ORDER — LORAZEPAM 0.5 MG PO TABS: 0.5000 mg | ORAL_TABLET | Freq: Two times a day (BID) | ORAL | 0 refills | Status: DC | PRN

## 2018-12-15 MED FILL — LORazepam 0.5 MG TABS: 0.5 | 30 days supply | Qty: 60 | Fill #0

## 2018-12-15 MED FILL — DULOXETINE HCL 20 MG CPEP: 20 | 30 days supply | Qty: 30 | Fill #0

## 2018-12-15 NOTE — Telephone Encounter (Signed)
Patient calls for refills on Ativan last filled 10/29 #30 and Oxycodone last filled 12/07/2018 #84 (should have been 14 day supply).  [phone]

## 2018-12-15 NOTE — Telephone Encounter (Signed)
I called Marissa [name] back and discussed her pain management. She is take oxycodone more than prescribed 1tab q4h, she states 2 tabs help much more than 1 tab. She has severe left shoulder pain, she does not have appointment with Dr. Maryjean Ka because she does not have Medicaid card. I recommend orthpedic evaluation for her left shoulder pain, she states she has no ride to go. She has been calling DSS for transportation assistance. I recommended her to try Cymbalta, will start her on low-dose 20 mg daily.  She agrees.  Will not refill oxycodone until November 24, she agrees.  Refilled her lorazepam today.  She appreciated the call.  [name] MD

## 2018-12-20 MED FILL — PROCHLORPERAZINE 10 MG TAB: 10 | 7 days supply | Qty: 30 | Fill #1

## 2018-12-21 MED FILL — oxyCODONE HCL 5 MG TABS: 5 | 14 days supply | Qty: 84 | Fill #0

## 2018-12-27 ENCOUNTER — Inpatient Hospital Stay: Payer: Medicaid Other | Admitting: Hematology

## 2018-12-27 ENCOUNTER — Inpatient Hospital Stay: Payer: Medicaid Other

## 2019-01-07 ENCOUNTER — Other Ambulatory Visit: Payer: Self-pay | Admitting: Hematology

## 2019-01-07 DIAGNOSIS — C3492 Malignant neoplasm of unspecified part of left bronchus or lung: Secondary | ICD-10-CM

## 2019-01-07 DIAGNOSIS — M25512 Pain in left shoulder: Secondary | ICD-10-CM

## 2019-01-07 NOTE — Telephone Encounter (Signed)
Prescription refill

## 2019-01-10 ENCOUNTER — Telehealth: Payer: Self-pay

## 2019-01-10 NOTE — Telephone Encounter (Signed)
Patient calls for refills on Oxycodone and Ativan to be sent into [facility] pharmacy.    [phone]

## 2019-01-11 ENCOUNTER — Other Ambulatory Visit: Payer: Self-pay | Admitting: Nurse Practitioner

## 2019-01-11 ENCOUNTER — Telehealth: Payer: Self-pay

## 2019-01-11 DIAGNOSIS — C3492 Malignant neoplasm of unspecified part of left bronchus or lung: Secondary | ICD-10-CM

## 2019-01-11 DIAGNOSIS — M25512 Pain in left shoulder: Secondary | ICD-10-CM

## 2019-01-11 MED ORDER — LORAZEPAM 0.5 MG PO TABS: 0.5000 mg | ORAL_TABLET | Freq: Two times a day (BID) | ORAL | 0 refills | Status: DC | PRN

## 2019-01-11 MED ORDER — OXYCODONE HCL 5 MG PO TABS: 5.0000 mg | ORAL_TABLET | Freq: Four times a day (QID) | ORAL | 0 refills | Status: DC | PRN

## 2019-01-11 MED FILL — oxyCODONE HCL 5 MG TABS: 5 | 18 days supply | Qty: 74 | Fill #0

## 2019-01-11 NOTE — Telephone Encounter (Signed)
Left message for patient to call back [facility]

## 2019-01-11 NOTE — Telephone Encounter (Signed)
TC to patient per Marissa Rue NP to let her know that Marissa [name]  Refilled her oxycodone and ativan. In an effort to wean her off opioids she has reduced oxy amount by 10 tabs this month (gave her 74 tabs). I let her know that Marissa [name] encourages her to space it out and only use for severe pain. Patient verbalized understanding no further problems pr concerns at this time.

## 2019-01-12 ENCOUNTER — Telehealth: Payer: Self-pay | Admitting: Radiation Oncology

## 2019-01-12 MED FILL — LORazepam 0.5 MG TABS: 0.5 | 30 days supply | Qty: 60 | Fill #0

## 2019-01-12 NOTE — Telephone Encounter (Signed)
  Radiation Oncology         [phone] ________________________________  Name: Marissa [name] MRN: [id number]  Date of Service: 01/12/2019  DOB: 02-09-1965  Post Treatment Telephone Note  Diagnosis:    Limited Stage Small Cell Carcinoma of the left lingula.  Interval Since Last Radiation:  5 weeks   09/28/2018-12/09/2018: The left lingula was treated to 60Gy in 28 fractions followed by a 6 Gy boost in 3 fractions.   Narrative:  The patient was contacted today for routine follow-up. During treatment she did very well with radiotherapy and did not have significant desquamation. She reports she is doing great and is able to eat what she likes. She denies any concerns otherwise since radiotherapy. She is due to follow up with Dr. Burr Medico but has had trouble with transportation.  Impression/Plan: 1.  Limited Stage Small Cell Carcinoma of the left lingula. The patient has been doing well since completion of radiotherapy. We discussed that we would be happy to continue to follow her in lieu of recommending PCI. She needs to get back to see Dr. Burr Medico to make sure she's completed systemic therapy and then we can proceed with prophylactic cranial irradiation (PCI). 2. PCI. We discussed the rationale to proceed with PCI, outlining the risks, benefits, short and long term risks associated. She is interested in proceeding after the holidays. She also knows that we would need to repeat an MRI of the brain prior to proceeding. We will touch base in January 2021.     Carola Rhine, PAC

## 2019-01-12 NOTE — Telephone Encounter (Signed)
Sure, I will reschedule her missed appointment with [place], thanks   [name] MD

## 2019-01-13 ENCOUNTER — Telehealth: Payer: Self-pay | Admitting: Hematology

## 2019-01-13 NOTE — Telephone Encounter (Signed)
Scheduled appt per 12/16 sch message - pt is aware of new appt date and time

## 2019-01-18 ENCOUNTER — Telehealth: Payer: Self-pay

## 2019-01-18 NOTE — Telephone Encounter (Signed)
Nutrition Follow-up:  Patient with small cell lung cancer followed by Dr. Burr Medico.  Patient has completed concurrent radiation and chemotherapy.  Planning to start prophylactic cranial irradiation in Jan 2021.    Spoke with patient via phone for nutrition follow-up.  Patient reports that her appetite is up and down.  Some days she is only able to eat a few bites and other days eats everything.  Reports that she has really been enjoying tuna and crackers.  Sometimes will mix tuna with mayo and put on crackers.  Recently had fried chicken, macaroni and cheese and beans for dinner.  Reports that she is drinking boost/ensure and sprite or 7up.  Thinks she may have gained some weight back.      Medications: reviewed  Labs: reviewed from glucose 103  Anthropometrics:   Weight 166 lb noted 11/25/2018 decreased from 172 lb in Sept 2020   NUTRITION DIAGNOSIS: Inadequate oral intake stable   INTERVENTION:  Encouraged patient to continue to eat good sources of protein. Reviewed examples today.  Encouraged boost/ensure daily Provided patient with contact information and encouraged to call RD if appetite and weight decrease.     MONITORING, EVALUATION, GOAL: Patient will consume adequate calories and protein to maintain lean muscle mass   NEXT VISIT: patient to call RD  Kenta Laster B. Zenia Resides, [place], [place] Registered Dietitian [phone] (pager)

## 2019-02-03 ENCOUNTER — Encounter: Payer: Self-pay | Admitting: Nurse Practitioner

## 2019-02-03 ENCOUNTER — Other Ambulatory Visit: Payer: Self-pay

## 2019-02-03 ENCOUNTER — Telehealth: Payer: Self-pay | Admitting: Radiation Oncology

## 2019-02-03 ENCOUNTER — Inpatient Hospital Stay: Payer: Medicaid Other

## 2019-02-03 ENCOUNTER — Inpatient Hospital Stay: Payer: Medicaid Other | Attending: Hematology | Admitting: Nurse Practitioner

## 2019-02-03 VITALS — BP 147/90 | HR 86 | Temp 98.2°F | Resp 17 | Ht 62.0 in | Wt 159.5 lb

## 2019-02-03 DIAGNOSIS — Z9221 Personal history of antineoplastic chemotherapy: Secondary | ICD-10-CM | POA: Diagnosis not present

## 2019-02-03 DIAGNOSIS — C3492 Malignant neoplasm of unspecified part of left bronchus or lung: Secondary | ICD-10-CM

## 2019-02-03 DIAGNOSIS — F329 Major depressive disorder, single episode, unspecified: Secondary | ICD-10-CM

## 2019-02-03 DIAGNOSIS — D509 Iron deficiency anemia, unspecified: Secondary | ICD-10-CM | POA: Diagnosis not present

## 2019-02-03 DIAGNOSIS — F419 Anxiety disorder, unspecified: Secondary | ICD-10-CM | POA: Diagnosis not present

## 2019-02-03 DIAGNOSIS — C771 Secondary and unspecified malignant neoplasm of intrathoracic lymph nodes: Secondary | ICD-10-CM | POA: Insufficient documentation

## 2019-02-03 DIAGNOSIS — E876 Hypokalemia: Secondary | ICD-10-CM | POA: Diagnosis not present

## 2019-02-03 DIAGNOSIS — C7931 Secondary malignant neoplasm of brain: Secondary | ICD-10-CM | POA: Diagnosis not present

## 2019-02-03 DIAGNOSIS — Z923 Personal history of irradiation: Secondary | ICD-10-CM | POA: Diagnosis not present

## 2019-02-03 DIAGNOSIS — C3490 Malignant neoplasm of unspecified part of unspecified bronchus or lung: Secondary | ICD-10-CM

## 2019-02-03 DIAGNOSIS — F32A Depression, unspecified: Secondary | ICD-10-CM

## 2019-02-03 DIAGNOSIS — C3412 Malignant neoplasm of upper lobe, left bronchus or lung: Secondary | ICD-10-CM | POA: Diagnosis not present

## 2019-02-03 DIAGNOSIS — M25512 Pain in left shoulder: Secondary | ICD-10-CM | POA: Diagnosis not present

## 2019-02-03 DIAGNOSIS — Z79899 Other long term (current) drug therapy: Secondary | ICD-10-CM | POA: Diagnosis not present

## 2019-02-03 DIAGNOSIS — F1290 Cannabis use, unspecified, uncomplicated: Secondary | ICD-10-CM | POA: Diagnosis not present

## 2019-02-03 LAB — CBC WITH DIFFERENTIAL (CANCER CENTER ONLY)
Abs Immature Granulocytes: 0.01 10*3/uL (ref 0.00–0.07)
Basophils Absolute: 0.1 10*3/uL (ref 0.0–0.1)
Basophils Relative: 1 %
Eosinophils Absolute: 0.2 10*3/uL (ref 0.0–0.5)
Eosinophils Relative: 2 %
HCT: 44.8 % (ref 36.0–46.0)
Hemoglobin: 13.9 g/dL (ref 12.0–15.0)
Immature Granulocytes: 0 %
Lymphocytes Relative: 16 %
Lymphs Abs: 1.1 10*3/uL (ref 0.7–4.0)
MCH: 31.2 pg (ref 26.0–34.0)
MCHC: 31 g/dL (ref 30.0–36.0)
MCV: 100.7 fL — ABNORMAL HIGH (ref 80.0–100.0)
Monocytes Absolute: 0.6 10*3/uL (ref 0.1–1.0)
Monocytes Relative: 9 %
Neutro Abs: 4.7 10*3/uL (ref 1.7–7.7)
Neutrophils Relative %: 72 %
Platelet Count: 334 10*3/uL (ref 150–400)
RBC: 4.45 MIL/uL (ref 3.87–5.11)
RDW: 14.3 % (ref 11.5–15.5)
WBC Count: 6.6 10*3/uL (ref 4.0–10.5)
nRBC: 0 % (ref 0.0–0.2)

## 2019-02-03 LAB — CMP (CANCER CENTER ONLY)
ALT: 10 U/L (ref 0–44)
AST: 14 U/L — ABNORMAL LOW (ref 15–41)
Albumin: 4 g/dL (ref 3.5–5.0)
Alkaline Phosphatase: 106 U/L (ref 38–126)
Anion gap: 10 (ref 5–15)
BUN: 24 mg/dL — ABNORMAL HIGH (ref 6–20)
CO2: 27 mmol/L (ref 22–32)
Calcium: 9.3 mg/dL (ref 8.9–10.3)
Chloride: 107 mmol/L (ref 98–111)
Creatinine: 0.94 mg/dL (ref 0.44–1.00)
GFR, Est AFR Am: >60 mL/min (ref >60)
GFR, Estimated: >60 mL/min (ref >60)
Glucose, Bld: 99 mg/dL (ref 70–99)
Potassium: 4.3 mmol/L (ref 3.5–5.1)
Sodium: 144 mmol/L (ref 135–145)
Total Bilirubin: <0.2 mg/dL — ABNORMAL LOW (ref 0.3–1.2)
Total Protein: 7.7 g/dL (ref 6.5–8.1)

## 2019-02-03 MED ORDER — OXYCODONE HCL 5 MG PO TABS: 5.0000 mg | ORAL_TABLET | Freq: Four times a day (QID) | ORAL | 0 refills | Status: DC | PRN

## 2019-02-03 MED ORDER — LORAZEPAM 1 MG PO TABS: 1.0000 mg | ORAL_TABLET | Freq: Two times a day (BID) | ORAL | 0 refills | Status: DC | PRN

## 2019-02-03 MED ORDER — PROCHLORPERAZINE MALEATE 10 MG PO TABS: 10.0000 mg | ORAL_TABLET | Freq: Four times a day (QID) | ORAL | 3 refills | Status: DC | PRN

## 2019-02-03 MED ORDER — BACLOFEN 10 MG PO TABS: 10.0000 mg | ORAL_TABLET | Freq: Three times a day (TID) | ORAL | 0 refills | Status: DC | PRN

## 2019-02-03 MED ORDER — GABAPENTIN 300 MG PO CAPS: ORAL_CAPSULE | ORAL | 2 refills | Status: DC

## 2019-02-03 MED FILL — LORAZEPAM 1 MG TABS: 1 | 15 days supply | Qty: 30 | Fill #0

## 2019-02-03 MED FILL — PROCHLORPERAZINE 10 MG TAB: 10 | 7 days supply | Qty: 30 | Fill #0

## 2019-02-03 MED FILL — BACLOFEN 10 MG TABS: 10 | 10 days supply | Qty: 30 | Fill #0

## 2019-02-03 MED FILL — oxyCODONE HCL 5 MG TABS: 5 | 16 days supply | Qty: 65 | Fill #0

## 2019-02-03 MED FILL — GABAPENTIN 300 MG CAPSULE: 300 | 30 days supply | Qty: 90 | Fill #0

## 2019-02-03 NOTE — Progress Notes (Signed)
[place]   [phone] [phone]   Clinic Follow up Note   Patient Care Team: Gildardo Pounds, NP as PCP - General (Nurse Practitioner) 02/03/2019  CHIEF COMPLAINT: F/u small cell lung cancer   SUMMARY OF ONCOLOGIC HISTORY: Oncology History Overview Note  Cancer Staging Small cell lung cancer, left upper lobe ([place]) Staging form: Lung, AJCC 8th Edition - Clinical stage from 08/24/2018: Stage IIIA (cT1a, cN2, cM0) - Signed by [name], MD on 10/17/2018    Small cell lung cancer, left upper lobe ([place])  07/29/2018 Imaging   CT Angio Chest 07/29/18  IMPRESSION: 1. Malignant appearing left mediastinal and hilar lymphadenopathy narrowing the airways at the left hilum. In the left lung only a subtle 9 mm spiculated nodule is identified in the lingula. Consider small cell carcinoma. Bronchoscopy or mediastinal node sampling might be most valuable for diagnosis. 2. Indeterminate although low-density (which typically which indicates a benign adenoma) left adrenal nodule. 3.  Negative for acute pulmonary embolus.   08/24/2018 Definitive Surgery   Bronchoscopy and EBUS biopsy on 08/24/18 by Dr. Loanne Drilling   08/24/2018 Initial Biopsy   Diagnosis 08/24/18 FINE NEEDLE ASPIRATION, ENDOSCOPIC, (EBUS) STATION # 7 LYMPH NODE(SPECIMEN 1 OF 4 COLLECTED 08/24/18): NO MALIGNANT CELLS IDENTIFIED.  FINE NEEDLE ASPIRATION, ENDOSCOPIC, (EBUS) LEFT HILAR LYMPH NODE(SPECIMEN 2 OF 4 COLLECTED 08/24/18): NO MALIGNANT CELLS IDENTIFIED.  TRANSBRONCHIAL NEEDLE ASPIRATION, WANG, (SPECIMEN 3 OF 4 COLLECTED 08/24/18): NO MALIGNANT CELLS IDENTIFIED.  BRONCHIAL BRUSHING, LUL (SPECIMEN 4 OF 4 COLLECTED 08/24/18): MALIGNANT CELLS PRESENT, MOST CONSISTENT WITH SMALL CELL CARCINOMA. SEE COMMENT.   08/24/2018 Cancer Staging   Staging form: Lung, AJCC 8th Edition - Clinical stage from 08/24/2018: Stage IIIA (cT1a, cN2, cM0) - Signed by [name], MD on 10/17/2018   08/26/2018 Initial  Diagnosis   Small cell lung cancer, left ([place])   08/26/2018 Imaging   CT AP W Contrast 08/26/18  IMPRESSION: 1. 2.9 cm left adrenal nodule cannot be definitively characterized on this study. Potentially a lipid poor adenoma, but metastatic disease not excluded. MRI of the abdomen with and without contrast may prove helpful to further evaluate. 2. No other findings to suggest metastatic disease in the abdomen/pelvis. 3. Left base atelectasis with small left pleural effusion.   08/27/2018 Imaging   MRI brain 08/27/18  IMPRESSION: 1. Motion degraded, incomplete examination. 2. Subcentimeter focus of diffusion signal abnormality in the left cerebellum. No enhancement is evident to strongly suggest a metastasis, and this may reflect an acute to subacute small vessel infarct. 3. No evidence of intracranial metastatic disease elsewhere. 4. Given motion artifact and incomplete postcontrast imaging, consider short-term follow-up MRI (possibly with sedation) to further evaluate the left cerebellar abnormality and provide a more thorough evaluation for metastatic disease.   09/02/2018 PET scan   IMPRESSION: 1. Hypermetabolic left hilar mass is identified encasing the left mainstem bronchus resulting in postobstructive atelectasis in the left upper lobe. 2. Hypermetabolic left mediastinal nodal metastasis. 3. Loculated left pleural effusion is identified with mild increased FDG uptake. Cannot rule out malignant pleural effusion. 4. No evidence for metastatic disease to the abdomen or pelvis or skeletal structures.     09/07/2018 - 11/10/2018 Chemotherapy   Concurrent chemoRT with IV cisplatin on day 1 and etopside day 1-3 for every 3 weeks starting 8/11. Will start radiation with Dr. Lisbeth Renshaw with cycle 2. She completed 4 cycles on 11/10/18   09/14/2018 Procedure   Thoracentesis yielding 750 mL   09/28/2018 - 12/03/2018 Radiation  Therapy   Concurrent chemoRT with Dr. Lisbeth Renshaw. Start with cycle 2  chemo on 09/28/18 and completed on 12/03/18.      CURRENT THERAPY: Observation   INTERVAL HISTORY: Marissa [name] returns for f/u as scheduled. She is doing well for the most part. Energy level is adequate to remain functional with short breaks. Able to clean her house. Appetite is low since chemoRT. She drinks Boost but sparingly due to cost. She qualified for Medicaid, disability, and foot stamps. She gets $529 per month. She can't schedule apt with Dr. Maryjean Ka now due to cost and transportation. N/v has "slacked off" since finishing chemoRT, now occurs every few days. Compazine and zofran help. Bowels are normal. Has little blood with wiping, known hemorrhoids. She has new abdominal cramps that are like menstrual cramps, no vaginal bleeding and no menses in more than 5 years, cramps started 1 month ago. Also in the last month she has worsening of her baseline productive cough. No chest pain or dyspnea. No recent fever or chills. She has tenderness to ribs under left breast, new over last month. Her baseline left side headaches have worsened over the last month, with blurry vision. She takes 3-4 BC powder which does not help much. Headache pain can be mild but mostly severe and "brings tears to my eyes." Her mood is improved, takes valium and ativan when available. She has not gotten a call back from [facility] provider.    MEDICAL HISTORY:  Past Medical History:  Diagnosis Date  . Asthma   . Chronic headaches   . COPD (chronic obstructive pulmonary disease) ([place])   . Depression   . Heart murmur   . Small cell lung cancer ([place])     SURGICAL HISTORY: Past Surgical History:  Procedure Laterality Date  . BRONCHIAL BIOPSY  08/24/2018   Procedure: BRONCHIAL BIOPSIES;  Surgeon: Margaretha Seeds, MD;  Location: [facility];  Service: Cardiopulmonary;;  . BRONCHIAL BRUSHINGS  08/24/2018   Procedure: BRONCHIAL BRUSHINGS;  Surgeon: Margaretha Seeds, MD;  Location: [facility];  Service: Cardiopulmonary;;  .  BRONCHIAL NEEDLE ASPIRATION BIOPSY  08/24/2018   Procedure: BRONCHIAL NEEDLE ASPIRATION BIOPSIES;  Surgeon: Margaretha Seeds, MD;  Location: [facility];  Service: Cardiopulmonary;;  . ENDOBRONCHIAL ULTRASOUND N/A 08/24/2018   Procedure: ENDOBRONCHIAL ULTRASOUND;  Surgeon: Margaretha Seeds, MD;  Location: [facility];  Service: Cardiopulmonary;  Laterality: N/A;  . IR THORACENTESIS ASP PLEURAL SPACE W/IMG GUIDE  09/14/2018  . TUBAL LIGATION    . VIDEO BRONCHOSCOPY  08/24/2018   Procedure: VIDEO BRONCHOSCOPY;  Surgeon: Margaretha Seeds, MD;  Location: [facility];  Service: Cardiopulmonary;;    I have reviewed the social history and family history with the patient and they are unchanged from previous note.  ALLERGIES:  is allergic to penicillins.  MEDICATIONS:  Current Outpatient Medications  Medication Sig Dispense Refill  . baclofen (LIORESAL) 10 MG tablet Take 1 tablet (10 mg total) by mouth 3 (three) times daily as needed for muscle spasms. 30 each 0  . DULoxetine (CYMBALTA) 20 MG capsule Take 1 capsule (20 mg total) by mouth daily. 30 capsule 0  . gabapentin (NEURONTIN) 300 MG capsule Take 1 tab in morning and 2 tabs at bedtime 90 capsule 2  . LORazepam (ATIVAN) 1 MG tablet Take 1 tablet (1 mg total) by mouth every 12 (twelve) hours as needed for anxiety. 30 tablet 0  . oxyCODONE (OXY IR/ROXICODONE) 5 MG immediate release tablet Take 1 tablet (5 mg total) by mouth  every 6 (six) hours as needed for severe pain. 65 tablet 0  . PRESCRIPTION MEDICATION Take 1 tablet by mouth as needed (anxiety/sleep).    . prochlorperazine (COMPAZINE) 10 MG tablet Take 1 tablet (10 mg total) by mouth every 6 (six) hours as needed (Nausea or vomiting). 30 tablet 3   No current facility-administered medications for this visit.    PHYSICAL EXAMINATION: ECOG PERFORMANCE STATUS: 1 - Symptomatic but completely ambulatory  Vitals:   02/03/19 1427  BP: (!) 147/90  Pulse: 86  Resp: 17  Temp: 98.2 F  (36.8 C)  SpO2: 95%   Filed Weights   02/03/19 1427  Weight: 159 lb 8 oz (72.3 kg)    GENERAL:alert, no distress and comfortable SKIN: no rash. Dry skin EYES: sclera clear LYMPH:  no palpable cervical or supraclavicular lymphadenopathy LUNGS: clear with normal breathing effort, no wheezing  HEART: regular rate & rhythm, no lower extremity edema ABDOMEN: abdomen soft, non-tender and normal bowel sounds. No hepatomegaly. Small nodularity to left lateral abdominal wall  Musculoskeletal: focal tenderness to left anterior lower rib near xyphoid process  NEURO: alert & oriented x 3 with fluent speech, no focal motor/sensory deficits  LABORATORY DATA:  I have reviewed the data as listed CBC Latest Ref Rng & Units 02/03/2019 11/25/2018 11/10/2018  WBC 4.0 - 10.5 K/uL 6.6 2.0(L) 3.7(L)  Hemoglobin 12.0 - 15.0 g/dL 13.9 9.0(L) 12.8  Hematocrit 36.0 - 46.0 % 44.8 28.8(L) 40.1  Platelets 150 - 400 K/uL 334 186 319     CMP Latest Ref Rng & Units 02/03/2019 11/25/2018 11/10/2018  Glucose 70 - 99 mg/dL 99 103(H) 111(H)  BUN 6 - 20 mg/dL 24(H) 17 12  Creatinine 0.44 - 1.00 mg/dL 0.94 0.93 0.75  Sodium 135 - 145 mmol/L 144 142 143  Potassium 3.5 - 5.1 mmol/L 4.3 4.2 3.7  Chloride 98 - 111 mmol/L 107 105 106  CO2 22 - 32 mmol/L 27 28 25   Calcium 8.9 - 10.3 mg/dL 9.3 9.1 9.4  Total Protein 6.5 - 8.1 g/dL 7.7 6.7 7.5  Total Bilirubin 0.3 - 1.2 mg/dL <0.2(L) <0.2(L) <0.2(L)  Alkaline Phos 38 - 126 U/L 106 81 87  AST 15 - 41 U/L 14(L) 10(L) 12(L)  ALT 0 - 44 U/L 10 9 11       RADIOGRAPHIC STUDIES: I have personally reviewed the radiological images as listed and agreed with the findings in the report. No results found.   ASSESSMENT & PLAN: Marissa [name] is a 54 y.o. female with   1.Small cell lung cancer, stage III, limited stage  -Diagnosed in 07/2018. Biopsy confirmed small cell lung cancer. CT AP and MRI brain negative for distant metastasis. PET scan from 09/02/18 shows Hypermetabolic left  mediastinal nodal metastasis, but no distant metastasis.  -She completed standard treatment concurrent chemoRT with cisplatin day 1 and etoposide on days 1-3 from 09/07/18 to 11/10/18. She completed RT from cycle 2 chemo, completed on 12/03/18  -She had n/v, low appetite, and pain on chemoRT but recovered well. -She has been on observation, doing well until over the last month she developed worsening cough, headaches, new abdominal cramping and left rib pain which is tender on exam. Clinically she looks well, labs unremarkable. However I am concerned for disease progression. Will proceed with brain MRI and restaging CT CAP in the next week or so. -She is hoping to start PCI soon if she is a candidate, I discussed with Shona Simpson, PA in rad onc who will order  MRI -Phone f/u in 2 weeks with results, due to her transportation difficulties.   2. Headaches  -she has h/o chronic headaches, controlled with BC powder but she has not needed any recently until 1 month ago she developed worsening headaches in left temporoparietal area with associated blurriness.  -I discussed with Shona Simpson, PA in rad onc who previously discussed the role of PCI. She would need brain MRI first. Will go ahead and schedule MRI to r/o brain mets and to determine if she is a candidate for PCI -patient requests sedation for MRI due to claustrophobia. I informed Bryson Ha  3. Smoking Cessation, COPD,H/oacute left tension pneumothorax, resolved -She has a h/o heavy smoking with COPD. She had exacerbation with acute left tension pneumothorax in 07/2018.  -She underwent bronchoscopy and EBUS biopsy on 7/28 and wasadmitted after procedure for pneumothorax. She required O2 supplementation. Now resolved.  -she has worsening cough today, no dyspnea or increased sputum. While this could represent COPD vs infection, the clinical picture does not support this. WBC is normal which is encouraging.  -She is being referred for restaging to  r/o cancer progression   4. LUQ pain beneath breast and left flank pain  -She had chest tube placed on left upper back S/p removal she has had severe radiating LUQ pain.  -Given this could be nerve damage causing her pain, on 09/03/18 she was started on gabapentin 300mg . She takes 300 mg AM and 600 mg PM -She has been referred to Dr. Donell Sievert office who initially could not schedule until her Medicaid was approved. It is now approved but she cannot afford specialist visit now, she also has not been able to get a ride there.  -her LUQ pain /breast pain is stable, however she has new 1 mo h/o left rib pain below her breast but closer to sternum, I am concerned for osseous lesion -she continues oxycodone as prescribed, keeps meds in locked box from her roommate   5. Iron deficient anemia  -Developed during recent hospitalization early on. S/p IV Iron on 7/320 and 08/27/18.anemia improved and she has been off supplement -resolved, no anemia today   6.Nausea, vomiting, poor appetite, secondary to chemo  -mildly improved since completing chemo, continues compazine and zofran PRN -7 lbs weight loss since last visit on 11/25/18, on supplements when she can get them. I encouraged her to eat more to maintain he weight -she developed abdominal cramping 1 month ago, like menstrual cramps although she has not had period in over 5 years.  -exam is benign. Etiology unclear. Will evaluate on CT  7. Hypokalemia  -normalized, she has been off K supplement -monitoring   8. Anxiety/Depression, irritability, social support -She has a h/o of anxiety and depression and was previously on antidepressants but did not tolerate well.  -she was very irritable at last visit, but she is happy and positive today. Mood is better overall.  -takes valium and ativan for high anxiety, admits to taking increasing doses of ativan because 0.5 is not sufficient. Will increase dose. I strongly cautioned her to use as  prescribed. If she does not, I explained I will not refill. She understands.  -continue SW involvement. She has not seen [facility] provider yet.  -Currently on Medicaid, disability, food stamps. She gets $529 per month. Still does not have a car   PLAN: -Labs reviewed -Proceed with brain MRI (possible sedation) and restaging CT CAP -Discussed with rad onc -Phone f/u in 2 weeks with results  -Refill baclofen,  gabapentin, lorazepam, oxycodone, ondansetron, prochlorperazine   No problem-specific Assessment & Plan notes found for this encounter.   Orders Placed This Encounter  Procedures  . CT Abdomen Pelvis W Contrast    Standing Status:   Future    Standing Expiration Date:   02/03/2020    Order Specific Question:   If indicated for the ordered procedure, I authorize the administration of contrast media per Radiology protocol    Answer:   Yes    Order Specific Question:   Is patient pregnant?    Answer:   No    Order Specific Question:   Preferred imaging location?    Answer:   [facility]    Order Specific Question:   Is Oral Contrast requested for this exam?    Answer:   Yes, Per Radiology protocol    Order Specific Question:   Radiology Contrast Protocol - do NOT remove file path    Answer:   \\[email or web address]  . CT Chest W Contrast    Standing Status:   Future    Standing Expiration Date:   02/03/2020    Order Specific Question:   ** REASON FOR EXAM (FREE TEXT)    Answer:   worsening cough, s/p chemoRT    Order Specific Question:   If indicated for the ordered procedure, I authorize the administration of contrast media per Radiology protocol    Answer:   Yes    Order Specific Question:   Is patient pregnant?    Answer:   No    Order Specific Question:   Preferred imaging location?    Answer:   [facility]    Order Specific Question:   Radiology Contrast Protocol - do NOT remove file path    Answer:    \\[email or web address]   All questions were answered. The patient knows to call the clinic with any problems, questions or concerns. No barriers to learning was detected. I spent 20 minutes counseling the patient face to face. The total time spent in the appointment was 25 minutes and more than 50% was on counseling and review of test results     Alla Feeling, NP 02/03/19

## 2019-02-03 NOTE — Telephone Encounter (Signed)
I spoke with Marissa [name] and the patient about proceeding with an MRI of the brain for PCI. The patient would prefer an MRI with general anesthesia. These studies are only done a few times a month, so we will see what is available. If it would be more than a week or two to coordinate, the patient would consider more sedation with benzodiazapines and try to do the scan without anesthesia. Medical oncology is also planning to restage her with CT scans and would like the scans done on the same day. I will reach out to our brain navigator to see if she can help facilitate our plans for MRI with general anesthesia at [facility].

## 2019-02-04 ENCOUNTER — Telehealth: Payer: Self-pay | Admitting: Nurse Practitioner

## 2019-02-04 NOTE — Telephone Encounter (Signed)
Scheduled appt per 1/7 los.  Left a vm of the appt date and time,

## 2019-02-07 ENCOUNTER — Telehealth: Payer: Self-pay | Admitting: Radiation Oncology

## 2019-02-07 DIAGNOSIS — C3490 Malignant neoplasm of unspecified part of unspecified bronchus or lung: Secondary | ICD-10-CM

## 2019-02-07 MED ORDER — DIAZEPAM 10 MG PO TABS: ORAL_TABLET | ORAL | 0 refills | Status: DC

## 2019-02-07 MED FILL — DIAZEPAM 10 MG TABS: 10 | 10 days supply | Qty: 10 | Fill #0

## 2019-02-07 NOTE — Telephone Encounter (Signed)
I called the patient to let her know that the soonest an MRI with general anesthesia is available is not until the end of February. She requests trying to do an MRI sooner with antianxiety medication due to her claustrophobia. She did not do well despite 1 mg of Ativan 30 minutes prior when she had her last scan on 08/27/2018. I let her know that we could try Valium, and she reports that occasionally she buys this on the streets and takes 20 mg at a time. I let her know that I was not comfortable with prescribing that dose, but referenced palliative care primer which would indicate a role for up to 10 mg at a time. She is in agreement to try this. I warned her not to take medication she can find on the street especially in addition to what I'm giving her and that I would only give her the pills so she can get through the MRI, and any related treatments for radiation. She is in agreement with this plan. I've placed orders for an MRI and we will follow up with these results when available.

## 2019-02-08 ENCOUNTER — Telehealth (HOSPITAL_COMMUNITY): Payer: Self-pay | Admitting: General Practice

## 2019-02-08 NOTE — Telephone Encounter (Addendum)
[place] CSW Progress Notes  Reviewed note from Cira Rue NP, patient states that she has no transportation and is waiting for a call from [facility] to reschedule missed appointments.  Called [facility] - patient no showed for first appointment w therapist, then asked that appt w psychiatrist be rescheduled, stating she has no transportation.  Patient has Medicaid - she can schedule free rides to all medical appointments by calling [phone] and give several days notice in order to schedule ride.  [facility] states that they are willing to reschedule these missed appointments for therapy and medications management.  They need patient to call them to reschedule, they do not reach out to patients to reschedule.  Left VM for patient w this information and also informed NP of the above.  Edwyna Shell, LCSW Clinical Social Worker Phone:  [phone] Cell:  [phone]

## 2019-02-10 ENCOUNTER — Encounter (HOSPITAL_COMMUNITY): Payer: Self-pay | Admitting: Radiology

## 2019-02-10 ENCOUNTER — Telehealth: Payer: Self-pay | Admitting: General Practice

## 2019-02-10 ENCOUNTER — Other Ambulatory Visit: Payer: Self-pay

## 2019-02-10 ENCOUNTER — Ambulatory Visit (HOSPITAL_COMMUNITY)
Admission: RE | Admit: 2019-02-10 | Discharge: 2019-02-10 | Disposition: A | Discharge status: Home / Self Care | Payer: Medicaid Other | Source: Ambulatory Visit | Attending: Nurse Practitioner | Admitting: Nurse Practitioner

## 2019-02-10 DIAGNOSIS — C3490 Malignant neoplasm of unspecified part of unspecified bronchus or lung: Secondary | ICD-10-CM | POA: Diagnosis not present

## 2019-02-10 MED ORDER — SODIUM CHLORIDE (PF) 0.9 % IJ SOLN
INTRAMUSCULAR | Status: AC
  Filled 2019-02-10: qty 50

## 2019-02-10 MED ORDER — IOHEXOL 300 MG/ML  SOLN
100.0000 mL | Freq: Once | INTRAMUSCULAR | Status: AC | PRN
  Administered 2019-02-10: 100 mL via INTRAVENOUS

## 2019-02-10 NOTE — Telephone Encounter (Signed)
[place] CSW Progress Notes  Called patient at her request - she was about to have procedure, requested call back tomorrow afternoon.  Appt made for phone visit.  Marland KitchenACCK

## 2019-02-11 ENCOUNTER — Ambulatory Visit (HOSPITAL_COMMUNITY)
Admission: RE | Admit: 2019-02-11 | Discharge: 2019-02-11 | Disposition: A | Discharge status: Home / Self Care | Payer: Medicaid Other | Source: Ambulatory Visit | Attending: Radiation Oncology | Admitting: Radiation Oncology

## 2019-02-11 ENCOUNTER — Other Ambulatory Visit: Payer: Medicaid Other | Admitting: General Practice

## 2019-02-11 ENCOUNTER — Inpatient Hospital Stay: Payer: Medicaid Other | Admitting: General Practice

## 2019-02-11 ENCOUNTER — Telehealth: Payer: Self-pay | Admitting: General Practice

## 2019-02-11 DIAGNOSIS — C3490 Malignant neoplasm of unspecified part of unspecified bronchus or lung: Secondary | ICD-10-CM | POA: Diagnosis not present

## 2019-02-11 MED ORDER — GADOBUTROL 1 MMOL/ML IV SOLN
7.0000 mL | Freq: Once | INTRAVENOUS | Status: AC | PRN
  Administered 2019-02-11: 7.5 mL via INTRAVENOUS

## 2019-02-11 NOTE — Telephone Encounter (Signed)
[place] CSW Progress Notes  Call to patient, she is still concerned about transportation to psychiatrist appointment.  She has been unable to get through to DSS - when I talked with patient, she is frustrated with the long waits to get through to the scheduler.  Encouraged her to persevere as Medicaid transport is free and can help her get to any medical appointments.  She also wonders if she has funds left on her [place] to let her know.  Also encouraged her to contact [facility] to understand why her check is under the normal SSI rate.  She states she will begin radiation to brain in the near future - let her know that her access to grant funds would start again when she is in active treatment.  Edwyna Shell, LCSW Clinical Social Worker Phone:  [phone] Cell:  [phone]

## 2019-02-14 ENCOUNTER — Telehealth: Payer: Self-pay | Admitting: Radiation Oncology

## 2019-02-14 NOTE — Telephone Encounter (Signed)
I called the patient to review her MRI results and need to give 30 Gy in 10 fractions. She is in agreement for whole brain radiotherapy. Her other restaging scans were better than anticipated and she sees Dr. Burr Medico later this week to discuss. She will come on Monday next week at 1pm for simulation. She is in agreement with this plan.

## 2019-02-17 ENCOUNTER — Inpatient Hospital Stay (HOSPITAL_BASED_OUTPATIENT_CLINIC_OR_DEPARTMENT_OTHER): Payer: Medicaid Other | Admitting: Nurse Practitioner

## 2019-02-17 ENCOUNTER — Encounter: Payer: Self-pay | Admitting: Nurse Practitioner

## 2019-02-17 DIAGNOSIS — F419 Anxiety disorder, unspecified: Secondary | ICD-10-CM

## 2019-02-17 DIAGNOSIS — M25512 Pain in left shoulder: Secondary | ICD-10-CM

## 2019-02-17 DIAGNOSIS — F329 Major depressive disorder, single episode, unspecified: Secondary | ICD-10-CM | POA: Diagnosis not present

## 2019-02-17 DIAGNOSIS — F32A Depression, unspecified: Secondary | ICD-10-CM

## 2019-02-17 DIAGNOSIS — C3492 Malignant neoplasm of unspecified part of left bronchus or lung: Secondary | ICD-10-CM | POA: Diagnosis not present

## 2019-02-17 MED ORDER — OXYCODONE HCL 5 MG PO TABS: 5.0000 mg | ORAL_TABLET | Freq: Four times a day (QID) | ORAL | 0 refills | Status: DC | PRN

## 2019-02-17 MED ORDER — LORAZEPAM 1 MG PO TABS: 1.0000 mg | ORAL_TABLET | Freq: Two times a day (BID) | ORAL | 0 refills | Status: DC | PRN

## 2019-02-17 MED ORDER — DULOXETINE HCL 20 MG PO CPEP: 40.0000 mg | ORAL_CAPSULE | Freq: Every day | ORAL | 1 refills | Status: DC

## 2019-02-17 MED FILL — oxyCODONE HCL 5 MG TABS: 5 | 16 days supply | Qty: 65 | Fill #0

## 2019-02-17 MED FILL — LORAZEPAM 1 MG TABS: 1 | 15 days supply | Qty: 30 | Fill #0

## 2019-02-17 MED FILL — DULOXETINE HCL 20 MG CPEP: 20 | 30 days supply | Qty: 60 | Fill #0

## 2019-02-17 NOTE — Progress Notes (Addendum)
[place]   [phone] [phone]   Clinic Follow up Note   Patient Care Team: Gildardo Pounds, NP as PCP - General (Nurse Practitioner) 02/17/2019  I connected with Marissa [name] on 02/17/19 at  2:00 PM EST by video enabled telemedicine visit and verified that I am speaking with the correct person using two identifiers.   I discussed the limitations, risks, security and privacy concerns of performing an evaluation and management service by telemedicine and the availability of in-person appointments. I also discussed with the patient that there may be a patient responsible charge related to this service. The patient expressed understanding and agreed to proceed.   Other persons participating in the visit and their role in the encounter: None  Patient's location: Home Provider's location: Home  CHIEF COMPLAINT: F/u small cell lung cancer   SUMMARY OF ONCOLOGIC HISTORY: Oncology History Overview Note  Cancer Staging Small cell lung cancer, left upper lobe ([place]) Staging form: Lung, AJCC 8th Edition - Clinical stage from 08/24/2018: Stage IIIA (cT1a, cN2, cM0) - Signed by [name], MD on 10/17/2018    Small cell lung cancer, left upper lobe ([place])  07/29/2018 Imaging   CT Angio Chest 07/29/18  IMPRESSION: 1. Malignant appearing left mediastinal and hilar lymphadenopathy narrowing the airways at the left hilum. In the left lung only a subtle 9 mm spiculated nodule is identified in the lingula. Consider small cell carcinoma. Bronchoscopy or mediastinal node sampling might be most valuable for diagnosis. 2. Indeterminate although low-density (which typically which indicates a benign adenoma) left adrenal nodule. 3.  Negative for acute pulmonary embolus.   08/24/2018 Definitive Surgery   Bronchoscopy and EBUS biopsy on 08/24/18 by Dr. Loanne Drilling   08/24/2018 Initial Biopsy   Diagnosis 08/24/18 FINE NEEDLE ASPIRATION, ENDOSCOPIC, (EBUS) STATION # 7 LYMPH  NODE(SPECIMEN 1 OF 4 COLLECTED 08/24/18): NO MALIGNANT CELLS IDENTIFIED.  FINE NEEDLE ASPIRATION, ENDOSCOPIC, (EBUS) LEFT HILAR LYMPH NODE(SPECIMEN 2 OF 4 COLLECTED 08/24/18): NO MALIGNANT CELLS IDENTIFIED.  TRANSBRONCHIAL NEEDLE ASPIRATION, WANG, (SPECIMEN 3 OF 4 COLLECTED 08/24/18): NO MALIGNANT CELLS IDENTIFIED.  BRONCHIAL BRUSHING, LUL (SPECIMEN 4 OF 4 COLLECTED 08/24/18): MALIGNANT CELLS PRESENT, MOST CONSISTENT WITH SMALL CELL CARCINOMA. SEE COMMENT.   08/24/2018 Cancer Staging   Staging form: Lung, AJCC 8th Edition - Clinical stage from 08/24/2018: Stage IIIA (cT1a, cN2, cM0) - Signed by [name], MD on 10/17/2018   08/26/2018 Initial Diagnosis   Small cell lung cancer, left ([place])   08/26/2018 Imaging   CT AP W Contrast 08/26/18  IMPRESSION: 1. 2.9 cm left adrenal nodule cannot be definitively characterized on this study. Potentially a lipid poor adenoma, but metastatic disease not excluded. MRI of the abdomen with and without contrast may prove helpful to further evaluate. 2. No other findings to suggest metastatic disease in the abdomen/pelvis. 3. Left base atelectasis with small left pleural effusion.   08/27/2018 Imaging   MRI brain 08/27/18  IMPRESSION: 1. Motion degraded, incomplete examination. 2. Subcentimeter focus of diffusion signal abnormality in the left cerebellum. No enhancement is evident to strongly suggest a metastasis, and this may reflect an acute to subacute small vessel infarct. 3. No evidence of intracranial metastatic disease elsewhere. 4. Given motion artifact and incomplete postcontrast imaging, consider short-term follow-up MRI (possibly with sedation) to further evaluate the left cerebellar abnormality and provide a more thorough evaluation for metastatic disease.   09/02/2018 PET scan   IMPRESSION: 1. Hypermetabolic left hilar mass is identified encasing the left mainstem bronchus resulting  in postobstructive atelectasis in the left upper lobe.  2. Hypermetabolic left mediastinal nodal metastasis. 3. Loculated left pleural effusion is identified with mild increased FDG uptake. Cannot rule out malignant pleural effusion. 4. No evidence for metastatic disease to the abdomen or pelvis or skeletal structures.     09/07/2018 - 11/10/2018 Chemotherapy   Concurrent chemoRT with IV cisplatin on day 1 and etopside day 1-3 for every 3 weeks starting 8/11. Will start radiation with Dr. Lisbeth Renshaw with cycle 2. She completed 4 cycles on 11/10/18   09/14/2018 Procedure   Thoracentesis yielding 750 mL   09/28/2018 - 12/03/2018 Radiation Therapy   Concurrent chemoRT with Dr. Lisbeth Renshaw. Start with cycle 2 chemo on 09/28/18 and completed on 12/03/18.    02/10/2019 Imaging   CT CAP W Contrast  IMPRESSION: 1. Interval response to therapy. Significant interval reduction in tumor burden involving the left hemithorax. 2. No new or progressive disease identified within the chest, abdomen or pelvis. 3. Emphysema and aortic atherosclerosis. 4. Stable left adrenal nodule.   Aortic Atherosclerosis (ICD10-I70.0) and Emphysema (ICD10-J43.9).     CURRENT THERAPY: PENDING brain radiation   INTERVAL HISTORY: Marissa [name] presents by phone as scheduled. She has a terrible "11 out of 10" headache today. Since last visit, left side and left frontal headaches have been getting worse. BC's don't control anymore, sometimes takes 3-4 at a time but now getting GI upset from that. Pain is sharpe. She is having to use walker this morning due to right knee pain. She was active yesterday around the house and in the yard, this morning work up to painful and "slightly puffy" right knee. Not red. Ice and joint cream help ease pain. Cough is stable, non-bloody sputum. No chest pain or dyspnea. She has soreness to her ribs under breasts. No fever, chills. She is eating and drinking well. She notes when she calls social security line for ride she can't get through to anyone, waits on hold, then  hangs up. Only rides she can arrange are through the cancer center.    MEDICAL HISTORY:  Past Medical History:  Diagnosis Date  . Asthma   . Chronic headaches   . COPD (chronic obstructive pulmonary disease) ([place])   . Depression   . Heart murmur   . Small cell lung cancer ([place])     SURGICAL HISTORY: Past Surgical History:  Procedure Laterality Date  . BRONCHIAL BIOPSY  08/24/2018   Procedure: BRONCHIAL BIOPSIES;  Surgeon: Margaretha Seeds, MD;  Location: [facility];  Service: Cardiopulmonary;;  . BRONCHIAL BRUSHINGS  08/24/2018   Procedure: BRONCHIAL BRUSHINGS;  Surgeon: Margaretha Seeds, MD;  Location: [facility];  Service: Cardiopulmonary;;  . BRONCHIAL NEEDLE ASPIRATION BIOPSY  08/24/2018   Procedure: BRONCHIAL NEEDLE ASPIRATION BIOPSIES;  Surgeon: Margaretha Seeds, MD;  Location: [facility];  Service: Cardiopulmonary;;  . ENDOBRONCHIAL ULTRASOUND N/A 08/24/2018   Procedure: ENDOBRONCHIAL ULTRASOUND;  Surgeon: Margaretha Seeds, MD;  Location: [facility];  Service: Cardiopulmonary;  Laterality: N/A;  . IR THORACENTESIS ASP PLEURAL SPACE W/IMG GUIDE  09/14/2018  . TUBAL LIGATION    . VIDEO BRONCHOSCOPY  08/24/2018   Procedure: VIDEO BRONCHOSCOPY;  Surgeon: Margaretha Seeds, MD;  Location: [facility];  Service: Cardiopulmonary;;    I have reviewed the social history and family history with the patient and they are unchanged from previous note.  ALLERGIES:  is allergic to penicillins.  MEDICATIONS:  Current Outpatient Medications  Medication Sig Dispense Refill  . baclofen (LIORESAL) 10 MG  tablet Take 1 tablet (10 mg total) by mouth 3 (three) times daily as needed for muscle spasms. 30 each 0  . diazepam (VALIUM) 10 MG tablet Take 1 tablet po 30 minutes prior to MRI or radiation 10 tablet 0  . DULoxetine (CYMBALTA) 20 MG capsule Take 1 capsule (20 mg total) by mouth daily. 30 capsule 0  . gabapentin (NEURONTIN) 300 MG capsule Take 1 tab in morning and 2 tabs at  bedtime 90 capsule 2  . LORazepam (ATIVAN) 1 MG tablet Take 1 tablet (1 mg total) by mouth every 12 (twelve) hours as needed for anxiety. 30 tablet 0  . oxyCODONE (OXY IR/ROXICODONE) 5 MG immediate release tablet Take 1 tablet (5 mg total) by mouth every 6 (six) hours as needed for severe pain. 65 tablet 0  . PRESCRIPTION MEDICATION Take 1 tablet by mouth as needed (anxiety/sleep).    . prochlorperazine (COMPAZINE) 10 MG tablet Take 1 tablet (10 mg total) by mouth every 6 (six) hours as needed (Nausea or vomiting). 30 tablet 3   No current facility-administered medications for this visit.    PHYSICAL EXAMINATION: ECOG PERFORMANCE STATUS: 1 - Symptomatic but completely ambulatory  There were no vitals filed for this visit. There were no vitals filed for this visit.  Patient is awake and alert but drowsy, oriented x4. speech is clear. Mood and affect appear normal for situation. No conversational dyspnea.    LABORATORY DATA:  No labs for today's visit    RADIOGRAPHIC STUDIES: I have personally reviewed the radiological images as listed and agreed with the findings in the report. No results found.   ASSESSMENT & PLAN: Marissa [name] a 54 y.o.femalewith   1.Small cell lung cancer with brain metastasis, stage IV, extensive stage  -Diagnosed in 07/2018. Biopsy confirmed small cell lung cancer. CT AP and MRI brain negative for distant metastasis. PET scan from 09/02/18 shows Hypermetabolic left mediastinal nodal metastasis, but no distant metastasis.  -She completed standard treatment concurrent chemoRT with cisplatin day 1 and etoposide on days 1-3 from 09/07/18 to 11/10/18. She completed RT from cycle 2 chemo, completed on 12/03/18  -She had n/v, low appetite, and pain on chemoRT but recovered well. -Today I reviewed her restaging CT CAP from 02/10/19 which shows marked reduction of tumor burden in the left hemithorax, no new metastasis in chest, abdomen, or pelvis. She responded well to  treatment -She was doing well on observation, but due to worsening headaches and in preparation for PCI she underwent brain MRI on 02/11/19 which showed 6x10 mm solitary brain metastasis in right frontal lobe. She is planning to start RT soon, Sim on 1/25. Anticipate 10 fractions.  -Ms. Locastro continues to have severe left side headaches, MRI did not show metastasis on the left. She takes many BC per day, I cautioned her to reduce that to avoid medication overuse headache and GI upset from Aspirin. I plan to refer her to Dr. Mickeal Skinner -I reviewed that given her brain metastasis, she now has stage IV cancer and is no longer curable. Stage IV SCLC is aggressive and carries a poor prognosis in general. She understands.  -she is otherwise doing well, stable cough. Her pain below breasts is possibly due to costochondritis related to her cough. No osseous abnormality seen there on CT.  -We will see her back towards the end of radiation to discuss the plan going forward  2. Headaches  -she has h/o chronic headaches, controlled with BC powder but she has not needed  any recently until 1 month ago she developed worsening headaches in left temporoparietal area with associated blurriness.  -MRI brain on 02/11/19 showed 10 mm solitary brain met in right frontal lobe, radiation is pending - I am not clear why she is having such extreme headaches on left side.  -we reviewed medication overuse headaches. Could be stress related. No concerning signs of sinus infection per her report.  -Will refer to Dr. Mickeal Skinner   3. Smoking Cessation, COPD,H/oacute left tension pneumothorax, resolved -She has a h/o heavy smoking with COPD. She had exacerbation with acute left tension pneumothorax in 07/2018.  -She underwent bronchoscopy and EBUS biopsy on 7/28 and wasadmitted after procedure for pneumothorax. She required O2 supplementation. Now resolved.  -she has worsening cough today, no dyspnea or increased sputum. While this could  represent COPD vs infection, the clinical picture does not support this. WBC is normal which is encouraging.  -She is being referred for restaging to r/o cancer progression   4. LUQ pain beneath breast and left flank pain  -She had chest tube placed on left upper back S/p removal she has had severe radiating LUQ pain.  -Given this could be nerve damage causing her pain, on 09/03/18 she was started on gabapentin 370m. She takes 300 mg AM and 600 mg PM -She has been referred to Dr. HDonell Sievertoffice who initially could not schedule until her Medicaid was approved. It is now approved but she cannot afford specialist visit now, she also has difficulty with rides   -her LUQ pain /breast pain is stable, however she has new 1 mo h/o left rib pain below her breast but closer to sternum. No evidence of osseous metastasis on CT from 1/14 -Today she reports pain below right breast. Question costochondral pain related to coughing. Will monitor.  -she continues oxycodone as prescribed, keeps meds in locked box from her roommate. I will refill for her today.   -She did not get benefit from cymbalta 20 mg. She agrees to titrate up to 40 mg daily and see if this helps. Will refill.  5. Iron deficient anemia  -Developed during recent hospitalization early on. S/p IV Iron on 7/320 and 08/27/18.anemia improved and she has been off supplement -previously resolved, no labs drawn today   6.Nausea, vomiting, poor appetite, secondary to chemo -mildly improved since completing chemo, continues compazine and zofran PRN -7 lbs weight loss since last visit on 11/25/18, on supplements when she can get them. I encouraged her to eat more to maintain he weight -she developed abdominal cramping 1 month ago, like menstrual cramps although she has not had period in over 5 years.  -exam was benign on 1/7. Not able to perform exam during today's virtual visit  -no clear cause on CT. Her only complaint today was GI upset from  heavy BC powder use.   7. Anxiety/Depression, irritability, social support -She has a h/o of anxiety and depression andwaspreviouslyon antidepressantsbut did not tolerate well.  -she was very irritable at last visit, but she is happy and positive today. Mood is better overall.  -takes valium and ativan for high anxiety, admits to taking increasing doses of ativan because 0.5 is not sufficient. Will increase dose. I strongly cautioned her to use as prescribed. If she does not, I explained I will not refill. She understands.  -continue SW involvement. [place]  -Currently on Medicaid, disability, food stamps. She gets $529 per month. Still does not have a car  8. Right knee pain -She has had this pain intermittently for quite some time.  -Xray on 09/28/18 showed degenerative changes.  -she was more active yesterday, woke up today with swollen and sore knee. Painful to bear weight, she requires walker. Not red.  -ice and joint cream helps -based on her report this does not sound like DVT, but given her SCLC will examine on 1/25 when she returns to [facility] to make sure. She does not have a ride today.   9. Goals of care  -I reviewed today based on new brain metastasis her cancer is stage IV, not curable but treatable. In general this diagnosis carries a poor prognosis. She understands  PLAN: -CT and MRI reviewed -Proceed with brain RT with sim 1/25 -Refer to Dr. Mickeal Skinner for headaches -Caution against BC use to avoid med overuse headache  -Refill ativan, oxycodone  -Restart cymbalta, 20 mg daily x1 week then increase to 40 mg daily  -F/u next week to examine her right leg then at end of radiation   No problem-specific Assessment & Plan notes found for this encounter.   No orders of the defined types were placed in this encounter.  All questions were answered. The patient knows to call the clinic with any problems, questions or concerns. No barriers to learning  were detected. I spent 27 minutes non-face-to-face time on today's call and more than 50% was on counseling and review of test results     Alla Feeling, NP 02/17/19

## 2019-02-17 NOTE — Telephone Encounter (Signed)
Got it, thanks!

## 2019-02-18 ENCOUNTER — Telehealth: Payer: Self-pay | Admitting: Internal Medicine

## 2019-02-18 ENCOUNTER — Telehealth: Payer: Self-pay | Admitting: Nurse Practitioner

## 2019-02-18 NOTE — Telephone Encounter (Signed)
Ms. Nader returned my call to schedule a new pt appt w/Dr. Mickeal Skinner on 1/25 at 3pm.

## 2019-02-18 NOTE — Telephone Encounter (Signed)
Scheduled appt per 1/21 los.  Spoke with pt and she is aware that her first appt is at 1:45.  Sent a secure chat to get the MD visit added.

## 2019-02-18 NOTE — Telephone Encounter (Signed)
I received a msg to schedule an appt for Marissa [name] to see Dr. Mickeal Skinner. I cld and lft the pt a vm to schedule.

## 2019-02-21 ENCOUNTER — Other Ambulatory Visit: Payer: Self-pay | Admitting: Radiation Oncology

## 2019-02-21 ENCOUNTER — Encounter: Payer: Self-pay | Admitting: Nurse Practitioner

## 2019-02-21 ENCOUNTER — Inpatient Hospital Stay (HOSPITAL_BASED_OUTPATIENT_CLINIC_OR_DEPARTMENT_OTHER): Payer: Medicaid Other | Admitting: Nurse Practitioner

## 2019-02-21 ENCOUNTER — Other Ambulatory Visit: Payer: Self-pay

## 2019-02-21 ENCOUNTER — Inpatient Hospital Stay (HOSPITAL_BASED_OUTPATIENT_CLINIC_OR_DEPARTMENT_OTHER): Payer: Medicaid Other | Admitting: Internal Medicine

## 2019-02-21 ENCOUNTER — Ambulatory Visit
Admission: RE | Admit: 2019-02-21 | Discharge: 2019-02-21 | Disposition: A | Discharge status: Home / Self Care | Payer: Medicaid Other | Source: Ambulatory Visit | Attending: Radiation Oncology | Admitting: Radiation Oncology

## 2019-02-21 VITALS — BP 112/57 | HR 91 | Temp 99.1°F | Resp 18 | Wt 162.4 lb

## 2019-02-21 DIAGNOSIS — Z510 Encounter for antineoplastic radiation therapy: Secondary | ICD-10-CM | POA: Insufficient documentation

## 2019-02-21 DIAGNOSIS — M25561 Pain in right knee: Secondary | ICD-10-CM | POA: Diagnosis not present

## 2019-02-21 DIAGNOSIS — C3492 Malignant neoplasm of unspecified part of left bronchus or lung: Secondary | ICD-10-CM | POA: Diagnosis not present

## 2019-02-21 DIAGNOSIS — R519 Headache, unspecified: Secondary | ICD-10-CM | POA: Diagnosis not present

## 2019-02-21 DIAGNOSIS — C3412 Malignant neoplasm of upper lobe, left bronchus or lung: Secondary | ICD-10-CM | POA: Diagnosis not present

## 2019-02-21 DIAGNOSIS — C7931 Secondary malignant neoplasm of brain: Secondary | ICD-10-CM | POA: Diagnosis not present

## 2019-02-21 MED ORDER — MEMANTINE HCL 5 MG PO TABS: ORAL_TABLET | ORAL | 0 refills | Status: DC

## 2019-02-21 MED ORDER — BACLOFEN 10 MG PO TABS: 10.0000 mg | ORAL_TABLET | Freq: Three times a day (TID) | ORAL | 0 refills | Status: DC | PRN

## 2019-02-21 MED FILL — MEMANTINE HCL 5 MG TABS: 5 | 28 days supply | Qty: 70 | Fill #0

## 2019-02-21 MED FILL — BACLOFEN 10 MG TABS: 10 | 10 days supply | Qty: 30 | Fill #0

## 2019-02-21 NOTE — Progress Notes (Signed)
During consent we agreed to try to start namenda to try to reduce long term cognitive effects of radiotherapy. She is willing to try if the cost is not prohibitive. I will copy social work as she is still having trouble with financial stability.

## 2019-02-21 NOTE — Progress Notes (Signed)
[facility] [place], [place] [address] [phone]   New Patient Evaluation  Date of Service: 02/21/19 Patient Name: Marissa [name] Patient MRN: [id number] Patient DOB: 04-24-1965 Provider: Ventura Sellers, MD  Identifying Statement:  Marissa [name] is a 54 y.o. female with Chronic daily headache [R51.9] who presents for initial consultation and evaluation regarding cancer associated neurologic deficits.    Referring Provider: Gildardo Pounds, NP [place],  [place] [address]  Primary Cancer:  Oncologic History: Oncology History Overview Note  Cancer Staging Small cell lung cancer, left upper lobe [facility]) Staging form: Lung, AJCC 8th Edition - Clinical stage from 08/24/2018: Stage IIIA (cT1a, cN2, cM0) - Signed by [name], MD on 10/17/2018    Small cell lung cancer, left upper lobe ([place])  07/29/2018 Imaging   CT Angio Chest 07/29/18  IMPRESSION: 1. Malignant appearing left mediastinal and hilar lymphadenopathy narrowing the airways at the left hilum. In the left lung only a subtle 9 mm spiculated nodule is identified in the lingula. Consider small cell carcinoma. Bronchoscopy or mediastinal node sampling might be most valuable for diagnosis. 2. Indeterminate although low-density (which typically which indicates a benign adenoma) left adrenal nodule. 3.  Negative for acute pulmonary embolus.   08/24/2018 Definitive Surgery   Bronchoscopy and EBUS biopsy on 08/24/18 by Dr. Loanne Drilling   08/24/2018 Initial Biopsy   Diagnosis 08/24/18 FINE NEEDLE ASPIRATION, ENDOSCOPIC, (EBUS) STATION # 7 LYMPH NODE(SPECIMEN 1 OF 4 COLLECTED 08/24/18): NO MALIGNANT CELLS IDENTIFIED.  FINE NEEDLE ASPIRATION, ENDOSCOPIC, (EBUS) LEFT HILAR LYMPH NODE(SPECIMEN 2 OF 4 COLLECTED 08/24/18): NO MALIGNANT CELLS IDENTIFIED.  TRANSBRONCHIAL NEEDLE ASPIRATION, WANG, (SPECIMEN 3 OF 4 COLLECTED 08/24/18): NO MALIGNANT CELLS IDENTIFIED.  BRONCHIAL  BRUSHING, LUL (SPECIMEN 4 OF 4 COLLECTED 08/24/18): MALIGNANT CELLS PRESENT, MOST CONSISTENT WITH SMALL CELL CARCINOMA. SEE COMMENT.   08/24/2018 Cancer Staging   Staging form: Lung, AJCC 8th Edition - Clinical stage from 08/24/2018: Stage IIIA (cT1a, cN2, cM0) - Signed by [name], MD on 10/17/2018   08/26/2018 Initial Diagnosis   Small cell lung cancer, left ([place])   08/26/2018 Imaging   CT AP W Contrast 08/26/18  IMPRESSION: 1. 2.9 cm left adrenal nodule cannot be definitively characterized on this study. Potentially a lipid poor adenoma, but metastatic disease not excluded. MRI of the abdomen with and without contrast may prove helpful to further evaluate. 2. No other findings to suggest metastatic disease in the abdomen/pelvis. 3. Left base atelectasis with small left pleural effusion.   08/27/2018 Imaging   MRI brain 08/27/18  IMPRESSION: 1. Motion degraded, incomplete examination. 2. Subcentimeter focus of diffusion signal abnormality in the left cerebellum. No enhancement is evident to strongly suggest a metastasis, and this may reflect an acute to subacute small vessel infarct. 3. No evidence of intracranial metastatic disease elsewhere. 4. Given motion artifact and incomplete postcontrast imaging, consider short-term follow-up MRI (possibly with sedation) to further evaluate the left cerebellar abnormality and provide a more thorough evaluation for metastatic disease.   09/02/2018 PET scan   IMPRESSION: 1. Hypermetabolic left hilar mass is identified encasing the left mainstem bronchus resulting in postobstructive atelectasis in the left upper lobe. 2. Hypermetabolic left mediastinal nodal metastasis. 3. Loculated left pleural effusion is identified with mild increased FDG uptake. Cannot rule out malignant pleural effusion. 4. No evidence for metastatic disease to the abdomen or pelvis or skeletal structures.     09/07/2018 - 11/10/2018 Chemotherapy   Concurrent  chemoRT  with IV cisplatin on day 1 and etopside day 1-3 for every 3 weeks starting 8/11. Will start radiation with Dr. Lisbeth Renshaw with cycle 2. She completed 4 cycles on 11/10/18   09/14/2018 Procedure   Thoracentesis yielding 750 mL   09/28/2018 - 12/03/2018 Radiation Therapy   Concurrent chemoRT with Dr. Lisbeth Renshaw. Start with cycle 2 chemo on 09/28/18 and completed on 12/03/18.    02/10/2019 Imaging   CT CAP W Contrast  IMPRESSION: 1. Interval response to therapy. Significant interval reduction in tumor burden involving the left hemithorax. 2. No new or progressive disease identified within the chest, abdomen or pelvis. 3. Emphysema and aortic atherosclerosis. 4. Stable left adrenal nodule.   Aortic Atherosclerosis (ICD10-I70.0) and Emphysema (ICD10-J43.9).     History of Present Illness: The patient's records from the referring physician were obtained and reviewed and the patient interviewed to confirm this HPI.  Marissa [name] describes years long history of frequent headaches.  They are described as lateralized throbbing with nausea and photophobia, lasting for hours up to an entire day.  For the past month her headaches have been daily and of particular burden.  No time of day or positional association.  For cancer associated LUQ pain, she has been taking oxycodone 5mg  5-6x per day over the past ~6 months.  Prior to that she had some chronic use of opiates for back pain.  Also reports 2-3x per day usage of "goody powder" although she stopped that several days ago.  Smokes marijuana frequently and takes ativan for anxiety symptoms.  Currently scheduled for WBRT for small R frontal metastasis identified on screening MRI.  SIM'd today.  Currently on observation for small cell lung ca.    Medications: Current Outpatient Medications on File Prior to Visit  Medication Sig Dispense Refill  . diazepam (VALIUM) 10 MG tablet Take 1 tablet po 30 minutes prior to MRI or radiation 10 tablet 0  . DULoxetine (CYMBALTA) 20  MG capsule Take 2 capsules (40 mg total) by mouth daily. Take 1 capsule by mouth daily for the first week, then increase to 2 caps daily 60 capsule 1  . gabapentin (NEURONTIN) 300 MG capsule Take 1 tab in morning and 2 tabs at bedtime 90 capsule 2  . LORazepam (ATIVAN) 1 MG tablet Take 1 tablet (1 mg total) by mouth every 12 (twelve) hours as needed for anxiety. 30 tablet 0  . oxyCODONE (OXY IR/ROXICODONE) 5 MG immediate release tablet Take 1 tablet (5 mg total) by mouth every 6 (six) hours as needed for severe pain. 65 tablet 0  . prochlorperazine (COMPAZINE) 10 MG tablet Take 1 tablet (10 mg total) by mouth every 6 (six) hours as needed (Nausea or vomiting). 30 tablet 3  . PRESCRIPTION MEDICATION Take 1 tablet by mouth as needed (anxiety/sleep).     No current facility-administered medications on file prior to visit.    Allergies:  Allergies  Allergen Reactions  . Penicillins Nausea And Vomiting    Did it involve swelling of the face/tongue/throat, SOB, or low BP? No Did it involve sudden or severe rash/hives, skin peeling, or any reaction on the inside of your mouth or nose? No Did you need to seek medical attention at a hospital or doctor's office? No When did it last happen?20 years If all above answers are "NO", may proceed with cephalosporin use.   Past Medical History:  Past Medical History:  Diagnosis Date  . Asthma   . Chronic headaches   . COPD (  chronic obstructive pulmonary disease) ([place])   . Depression   . Heart murmur   . Small cell lung cancer [facility])    Past Surgical History:  Past Surgical History:  Procedure Laterality Date  . BRONCHIAL BIOPSY  08/24/2018   Procedure: BRONCHIAL BIOPSIES;  Surgeon: Margaretha Seeds, MD;  Location: [facility];  Service: Cardiopulmonary;;  . BRONCHIAL BRUSHINGS  08/24/2018   Procedure: BRONCHIAL BRUSHINGS;  Surgeon: Margaretha Seeds, MD;  Location: [facility];  Service: Cardiopulmonary;;  . BRONCHIAL NEEDLE ASPIRATION  BIOPSY  08/24/2018   Procedure: BRONCHIAL NEEDLE ASPIRATION BIOPSIES;  Surgeon: Margaretha Seeds, MD;  Location: [facility];  Service: Cardiopulmonary;;  . ENDOBRONCHIAL ULTRASOUND N/A 08/24/2018   Procedure: ENDOBRONCHIAL ULTRASOUND;  Surgeon: Margaretha Seeds, MD;  Location: [facility];  Service: Cardiopulmonary;  Laterality: N/A;  . IR THORACENTESIS ASP PLEURAL SPACE W/IMG GUIDE  09/14/2018  . TUBAL LIGATION    . VIDEO BRONCHOSCOPY  08/24/2018   Procedure: VIDEO BRONCHOSCOPY;  Surgeon: Margaretha Seeds, MD;  Location: [facility];  Service: Cardiopulmonary;;   Social History:  Social History   Socioeconomic History  . Marital status: Single    Spouse name: Not on file  . Number of children: Not on file  . Years of education: Not on file  . Highest education level: Not on file  Occupational History  . Not on file  Tobacco Use  . Smoking status: Current Every Day Smoker    Packs/day: 2.00    Years: 39.00    Pack years: 78.00    Types: Cigarettes    Start date: 06/22/1979  . Smokeless tobacco: Never Used  Substance and Sexual Activity  . Alcohol use: Yes    Comment: occasional  . Drug use: Not Currently    Types: Marijuana  . Sexual activity: Yes    Birth control/protection: Surgical  Other Topics Concern  . Not on file  Social History Narrative  . Not on file   Social Determinants of Health   Financial Resource Strain:   . Difficulty of Paying Living Expenses: Not on file  Food Insecurity:   . Worried About Charity fundraiser in the Last Year: Not on file  . Ran Out of Food in the Last Year: Not on file  Transportation Needs: Unmet Transportation Needs  . Lack of Transportation (Medical): Yes  . Lack of Transportation (Non-Medical): Yes  Physical Activity:   . Days of Exercise per Week: Not on file  . Minutes of Exercise per Session: Not on file  Stress:   . Feeling of Stress : Not on file  Social Connections:   . Frequency of Communication with Friends  and Family: Not on file  . Frequency of Social Gatherings with Friends and Family: Not on file  . Attends Religious Services: Not on file  . Active Member of Clubs or Organizations: Not on file  . Attends Archivist Meetings: Not on file  . Marital Status: Not on file  Intimate Partner Violence:   . Fear of Current or Ex-Partner: Not on file  . Emotionally Abused: Not on file  . Physically Abused: Not on file  . Sexually Abused: Not on file   Family History:  Family History  Problem Relation Age of Onset  . Heart disease Mother   . Dementia Father     Review of Systems: Constitutional: Doesn't report fevers, chills or abnormal weight loss Eyes: Doesn't report blurriness of vision Ears, nose, mouth, throat, and face: Doesn't  report sore throat Respiratory: Doesn't report cough, dyspnea or wheezes Cardiovascular: Doesn't report palpitation, chest discomfort  Gastrointestinal:  Doesn't report nausea, constipation, diarrhea GU: Doesn't report incontinence Skin: Doesn't report skin rashes Neurological: Per HPI Musculoskeletal: Back pain Behavioral/Psych: Doesn't report anxiety  Physical Exam: 02/21/19 02/21/19 02/03/19   Last reading 3:03 PM 2:30 PM 2:33 PM  BP -- 112/57 147/90 [[place]he LPN is aware]  Pulse Rate -- 91 86  Resp -- 18 17  Temp -- 99.1 F (37.3 C) 98.2 F (36.8 C)  Temp Source -- Temporal Temporal  SpO2 -- 97 % 95 %  Weight -- 162 lb 6.4 oz (73.7 kg) 159 lb 8 oz (72.3 kg)  Height -- -- 5\' 2"  (1.575 m)  Pain Score 10 10 8     KPS: 90. General: Alert, cooperative, pleasant, in no acute distress Head: Normal EENT: No conjunctival injection or scleral icterus.  Lungs: Resp effort normal Cardiac: Regular rate Abdomen: Non-distended abdomen Skin: No rashes cyanosis or petechiae. Extremities: No clubbing or edema  Neurologic Exam: Mental Status: Awake, alert, attentive to examiner. Oriented to self and environment. Language is fluent with intact  comprehension.  Cranial Nerves: Visual acuity is grossly normal. Visual fields are full. Extra-ocular movements intact. No ptosis. Face is symmetric Motor: Tone and bulk are normal. Power is full in both arms and legs.  Sensory: Intact to light touch Gait: Normal.   Labs: I have reviewed the data as listed    Component Value Date/Time   NA 144 02/03/2019 1406   K 4.3 02/03/2019 1406   CL 107 02/03/2019 1406   CO2 27 02/03/2019 1406   GLUCOSE 99 02/03/2019 1406   BUN 24 (H) 02/03/2019 1406   CREATININE 0.94 02/03/2019 1406   CALCIUM 9.3 02/03/2019 1406   PROT 7.7 02/03/2019 1406   ALBUMIN 4.0 02/03/2019 1406   AST 14 (L) 02/03/2019 1406   ALT 10 02/03/2019 1406   ALKPHOS 106 02/03/2019 1406   BILITOT <0.2 (L) 02/03/2019 1406   GFRNONAA >60 02/03/2019 1406   GFRAA >60 02/03/2019 1406   Lab Results  Component Value Date   WBC 6.6 02/03/2019   NEUTROABS 4.7 02/03/2019   HGB 13.9 02/03/2019   HCT 44.8 02/03/2019   MCV 100.7 (H) 02/03/2019   PLT 334 02/03/2019    Imaging:  CT Chest W Contrast  Result Date: 02/10/2019 CLINICAL DATA:  Small cell lung cancer. Restaging EXAM: CT CHEST, ABDOMEN, AND PELVIS WITH CONTRAST TECHNIQUE: Multidetector CT imaging of the chest, abdomen and pelvis was performed following the standard protocol during bolus administration of intravenous contrast. CONTRAST:  145mL OMNIPAQUE IOHEXOL 300 MG/ML  SOLN COMPARISON:  09/11/2018 FINDINGS: CT CHEST FINDINGS Cardiovascular: No significant vascular findings. Normal heart size. No pericardial effusion. Mediastinum/Nodes: Normal appearance of the thyroid gland. The trachea appears patent and is midline. Normal appearance of the esophagus. No mediastinal the or hilar adenopathy. No supraclavicular or axillary adenopathy. Lungs/Pleura: No pleural effusion. Paraseptal and centrilobular emphysema. Marked interval reduction in tumor burden involving the left hemithorax. Thin rind of soft tissue within the left upper  lobe extending over the left apex measures 8 mm in thickness, image 98/5. This is compared with 3.6 cm previously. Small rind of soft tissue within the left prevascular region measures 1.4 x 0.5 cm, image 21/2. This is compared with 6.3 x 4.8 cm previously. There is no new or progressive disease identified within the chest. Musculoskeletal: No chest wall mass or suspicious bone lesions identified. CT ABDOMEN PELVIS  FINDINGS Hepatobiliary: No focal liver abnormality is seen. No gallstones, gallbladder wall thickening, or biliary dilatation. Pancreas: Unremarkable. No pancreatic ductal dilatation or surrounding inflammatory changes. Spleen: Normal in size without focal abnormality. Adrenals/Urinary Tract: Normal right adrenal gland. Unchanged left adrenal nodule measuring 3.0 by 2.0 cm, image 58/2. No kidney mass or hydronephrosis. Normal appearance of the bladder. Stomach/Bowel: Stomach is within normal limits. Appendix appears normal. No evidence of bowel wall thickening, distention, or inflammatory changes. Distal colonic diverticulosis without acute inflammation. Vascular/Lymphatic: Aortic atherosclerosis. No abdominopelvic adenopathy. Reproductive: Uterus and bilateral adnexa are unremarkable. Other: No abdominal wall hernia or abnormality. No abdominopelvic ascites. Musculoskeletal: No acute or significant osseous findings. IMPRESSION: 1. Interval response to therapy. Significant interval reduction in tumor burden involving the left hemithorax. 2. No new or progressive disease identified within the chest, abdomen or pelvis. 3. Emphysema and aortic atherosclerosis. 4. Stable left adrenal nodule. Aortic Atherosclerosis (ICD10-I70.0) and Emphysema (ICD10-J43.9). Electronically Signed   By: Kerby Moors M.D.   On: 02/10/2019 16:12   MR Brain W Wo Contrast  Result Date: 02/11/2019 CLINICAL DATA:  Small cell lung cancer staging EXAM: MRI HEAD WITHOUT AND WITH CONTRAST TECHNIQUE: Multiplanar, multiecho pulse  sequences of the brain and surrounding structures were obtained without and with intravenous contrast. CONTRAST:  7.71mL GADAVIST GADOBUTROL 1 MMOL/ML IV SOLN COMPARISON:  None. FINDINGS: Brain: Enhancing mass right frontal lobe measuring 6 x 10 mm with minimal surrounding edema. No other enhancing mass lesion identified in the brain. Ventricle size normal. Negative for acute infarct. Negative for hemorrhage or midline shift. Few small scattered white matter hyperintensities bilaterally appear chronic. Vascular: Normal arterial flow voids Skull and upper cervical spine: No focal skeletal lesion. Sinuses/Orbits: Negative Other: None IMPRESSION: 6 x 10 mm solitary metastatic deposit right frontal lobe with minimal edema. Electronically Signed   By: Franchot Gallo M.D.   On: 02/11/2019 11:50   CT Abdomen Pelvis W Contrast  Result Date: 02/10/2019 CLINICAL DATA:  Small cell lung cancer. Restaging EXAM: CT CHEST, ABDOMEN, AND PELVIS WITH CONTRAST TECHNIQUE: Multidetector CT imaging of the chest, abdomen and pelvis was performed following the standard protocol during bolus administration of intravenous contrast. CONTRAST:  167mL OMNIPAQUE IOHEXOL 300 MG/ML  SOLN COMPARISON:  09/11/2018 FINDINGS: CT CHEST FINDINGS Cardiovascular: No significant vascular findings. Normal heart size. No pericardial effusion. Mediastinum/Nodes: Normal appearance of the thyroid gland. The trachea appears patent and is midline. Normal appearance of the esophagus. No mediastinal the or hilar adenopathy. No supraclavicular or axillary adenopathy. Lungs/Pleura: No pleural effusion. Paraseptal and centrilobular emphysema. Marked interval reduction in tumor burden involving the left hemithorax. Thin rind of soft tissue within the left upper lobe extending over the left apex measures 8 mm in thickness, image 98/5. This is compared with 3.6 cm previously. Small rind of soft tissue within the left prevascular region measures 1.4 x 0.5 cm, image  21/2. This is compared with 6.3 x 4.8 cm previously. There is no new or progressive disease identified within the chest. Musculoskeletal: No chest wall mass or suspicious bone lesions identified. CT ABDOMEN PELVIS FINDINGS Hepatobiliary: No focal liver abnormality is seen. No gallstones, gallbladder wall thickening, or biliary dilatation. Pancreas: Unremarkable. No pancreatic ductal dilatation or surrounding inflammatory changes. Spleen: Normal in size without focal abnormality. Adrenals/Urinary Tract: Normal right adrenal gland. Unchanged left adrenal nodule measuring 3.0 by 2.0 cm, image 58/2. No kidney mass or hydronephrosis. Normal appearance of the bladder. Stomach/Bowel: Stomach is within normal limits. Appendix appears normal. No evidence of  bowel wall thickening, distention, or inflammatory changes. Distal colonic diverticulosis without acute inflammation. Vascular/Lymphatic: Aortic atherosclerosis. No abdominopelvic adenopathy. Reproductive: Uterus and bilateral adnexa are unremarkable. Other: No abdominal wall hernia or abnormality. No abdominopelvic ascites. Musculoskeletal: No acute or significant osseous findings. IMPRESSION: 1. Interval response to therapy. Significant interval reduction in tumor burden involving the left hemithorax. 2. No new or progressive disease identified within the chest, abdomen or pelvis. 3. Emphysema and aortic atherosclerosis. 4. Stable left adrenal nodule. Aortic Atherosclerosis (ICD10-I70.0) and Emphysema (ICD10-J43.9). Electronically Signed   By: Kerby Moors M.D.   On: 02/10/2019 16:12     Assessment/Plan Chronic daily headache [R51.9]  Ms. Cravey presents with clinical syndrome of chronic daily headache secondary to medication overuse.  Her brain metastasis is not likely contributing to any headache symptoms.  Her oxycodone is the clear culprit here; we discussed mechanism and physiology of pain sensitivity from chronic analgesia and potential treatment  pathways.  We recommended starting adopting increased awareness of pain medication usage, and recording dosages with an analgesia calendar.  She understands it will take months to begin to make meanigful progress based on her history.  The calendar will help keep track of any changes for positive reinforcement.  Also discussed possibility of referral to pain clinic, it seems she had refused this in the past.    Did not recommend additional headache preventative medications, as they are unlikely to be helpful until chronic pain medication overuse is addressed.   We spent twenty additional minutes teaching regarding the natural history, biology, and historical experience in the treatment of neurologic complications of cancer.   We appreciate the opportunity to participate in the care of Marissa [name].  She should return to clinic in 3 months following MRI brain, or sooner if needed, for further assessment and management.  All questions were answered. The patient knows to call the clinic with any problems, questions or concerns. No barriers to learning were detected.  The total time spent in the encounter was 40 minutes and more than 50% was on counseling and review of test results   Ventura Sellers, MD Medical Director of Neuro-Oncology [facility] 02/21/19 4:10 PM

## 2019-02-21 NOTE — Progress Notes (Signed)
[name]   [phone] [phone]   Clinic Follow up Note   Patient Care Team: Gildardo Pounds, NP as PCP - General (Nurse Practitioner) 02/21/2019  CHIEF COMPLAINT: F/u right knee pain    SUMMARY OF ONCOLOGIC HISTORY: Oncology History Overview Note  Cancer Staging Small cell lung cancer, left upper lobe ([place]) Staging form: Lung, AJCC 8th Edition - Clinical stage from 08/24/2018: Stage IIIA (cT1a, cN2, cM0) - Signed by [name], MD on 10/17/2018    Small cell lung cancer, left upper lobe ([place])  07/29/2018 Imaging   CT Angio Chest 07/29/18  IMPRESSION: 1. Malignant appearing left mediastinal and hilar lymphadenopathy narrowing the airways at the left hilum. In the left lung only a subtle 9 mm spiculated nodule is identified in the lingula. Consider small cell carcinoma. Bronchoscopy or mediastinal node sampling might be most valuable for diagnosis. 2. Indeterminate although low-density (which typically which indicates a benign adenoma) left adrenal nodule. 3.  Negative for acute pulmonary embolus.   08/24/2018 Definitive Surgery   Bronchoscopy and EBUS biopsy on 08/24/18 by Dr. Loanne Drilling   08/24/2018 Initial Biopsy   Diagnosis 08/24/18 FINE NEEDLE ASPIRATION, ENDOSCOPIC, (EBUS) STATION # 7 LYMPH NODE(SPECIMEN 1 OF 4 COLLECTED 08/24/18): NO MALIGNANT CELLS IDENTIFIED.  FINE NEEDLE ASPIRATION, ENDOSCOPIC, (EBUS) LEFT HILAR LYMPH NODE(SPECIMEN 2 OF 4 COLLECTED 08/24/18): NO MALIGNANT CELLS IDENTIFIED.  TRANSBRONCHIAL NEEDLE ASPIRATION, WANG, (SPECIMEN 3 OF 4 COLLECTED 08/24/18): NO MALIGNANT CELLS IDENTIFIED.  BRONCHIAL BRUSHING, LUL (SPECIMEN 4 OF 4 COLLECTED 08/24/18): MALIGNANT CELLS PRESENT, MOST CONSISTENT WITH SMALL CELL CARCINOMA. SEE COMMENT.   08/24/2018 Cancer Staging   Staging form: Lung, AJCC 8th Edition - Clinical stage from 08/24/2018: Stage IIIA (cT1a, cN2, cM0) - Signed by [name], MD on 10/17/2018   08/26/2018 Initial Diagnosis     Small cell lung cancer, left ([place])   08/26/2018 Imaging   CT AP W Contrast 08/26/18  IMPRESSION: 1. 2.9 cm left adrenal nodule cannot be definitively characterized on this study. Potentially a lipid poor adenoma, but metastatic disease not excluded. MRI of the abdomen with and without contrast may prove helpful to further evaluate. 2. No other findings to suggest metastatic disease in the abdomen/pelvis. 3. Left base atelectasis with small left pleural effusion.   08/27/2018 Imaging   MRI brain 08/27/18  IMPRESSION: 1. Motion degraded, incomplete examination. 2. Subcentimeter focus of diffusion signal abnormality in the left cerebellum. No enhancement is evident to strongly suggest a metastasis, and this may reflect an acute to subacute small vessel infarct. 3. No evidence of intracranial metastatic disease elsewhere. 4. Given motion artifact and incomplete postcontrast imaging, consider short-term follow-up MRI (possibly with sedation) to further evaluate the left cerebellar abnormality and provide a more thorough evaluation for metastatic disease.   09/02/2018 PET scan   IMPRESSION: 1. Hypermetabolic left hilar mass is identified encasing the left mainstem bronchus resulting in postobstructive atelectasis in the left upper lobe. 2. Hypermetabolic left mediastinal nodal metastasis. 3. Loculated left pleural effusion is identified with mild increased FDG uptake. Cannot rule out malignant pleural effusion. 4. No evidence for metastatic disease to the abdomen or pelvis or skeletal structures.     09/07/2018 - 11/10/2018 Chemotherapy   Concurrent chemoRT with IV cisplatin on day 1 and etopside day 1-3 for every 3 weeks starting 8/11. Will start radiation with Dr. Lisbeth Renshaw with cycle 2. She completed 4 cycles on 11/10/18   09/14/2018 Procedure   Thoracentesis yielding 750 mL   09/28/2018 - 12/03/2018  Radiation Therapy   Concurrent chemoRT with Dr. Lisbeth Renshaw. Start with cycle 2 chemo on  09/28/18 and completed on 12/03/18.    02/10/2019 Imaging   CT CAP W Contrast  IMPRESSION: 1. Interval response to therapy. Significant interval reduction in tumor burden involving the left hemithorax. 2. No new or progressive disease identified within the chest, abdomen or pelvis. 3. Emphysema and aortic atherosclerosis. 4. Stable left adrenal nodule.   Aortic Atherosclerosis (ICD10-I70.0) and Emphysema (ICD10-J43.9).     INTERVAL HISTORY: Ms. Tippets returns as scheduled. She had a virtual visit on 1/21 and reported right knee/leg pain. This is a scheduled f/u to examine her knee. She got diclofenac cream from a friend and applied it TID over the weekend. This helped some. She can bear more weight. She is not using walker today. The knee is tender. There is no knee or leg redness or warmth. No calf pain or swelling. Cough is stable. Headaches persist. Still reports heavy tylenol use without much success. She had brain RT sim today, and will see Dr. Mickeal Skinner next.    MEDICAL HISTORY:  Past Medical History:  Diagnosis Date  . Asthma   . Chronic headaches   . COPD (chronic obstructive pulmonary disease) ([place])   . Depression   . Heart murmur   . Small cell lung cancer ([place])     SURGICAL HISTORY: Past Surgical History:  Procedure Laterality Date  . BRONCHIAL BIOPSY  08/24/2018   Procedure: BRONCHIAL BIOPSIES;  Surgeon: Margaretha Seeds, MD;  Location: [facility];  Service: Cardiopulmonary;;  . BRONCHIAL BRUSHINGS  08/24/2018   Procedure: BRONCHIAL BRUSHINGS;  Surgeon: Margaretha Seeds, MD;  Location: [facility];  Service: Cardiopulmonary;;  . BRONCHIAL NEEDLE ASPIRATION BIOPSY  08/24/2018   Procedure: BRONCHIAL NEEDLE ASPIRATION BIOPSIES;  Surgeon: Margaretha Seeds, MD;  Location: [facility];  Service: Cardiopulmonary;;  . ENDOBRONCHIAL ULTRASOUND N/A 08/24/2018   Procedure: ENDOBRONCHIAL ULTRASOUND;  Surgeon: Margaretha Seeds, MD;  Location: [facility];  Service:  Cardiopulmonary;  Laterality: N/A;  . IR THORACENTESIS ASP PLEURAL SPACE W/IMG GUIDE  09/14/2018  . TUBAL LIGATION    . VIDEO BRONCHOSCOPY  08/24/2018   Procedure: VIDEO BRONCHOSCOPY;  Surgeon: Margaretha Seeds, MD;  Location: [facility];  Service: Cardiopulmonary;;    I have reviewed the social history and family history with the patient and they are unchanged from previous note.  ALLERGIES:  is allergic to penicillins.  MEDICATIONS:  Current Outpatient Medications  Medication Sig Dispense Refill  . baclofen (LIORESAL) 10 MG tablet Take 1 tablet (10 mg total) by mouth 3 (three) times daily as needed for muscle spasms. 30 each 0  . diazepam (VALIUM) 10 MG tablet Take 1 tablet po 30 minutes prior to MRI or radiation 10 tablet 0  . DULoxetine (CYMBALTA) 20 MG capsule Take 2 capsules (40 mg total) by mouth daily. Take 1 capsule by mouth daily for the first week, then increase to 2 caps daily 60 capsule 1  . gabapentin (NEURONTIN) 300 MG capsule Take 1 tab in morning and 2 tabs at bedtime 90 capsule 2  . LORazepam (ATIVAN) 1 MG tablet Take 1 tablet (1 mg total) by mouth every 12 (twelve) hours as needed for anxiety. 30 tablet 0  . memantine (NAMENDA) 5 MG tablet Start on 1st day of radiation. Week 1: take one po daily. Week 2: take one po twice daily. Week 3: take two po qam, and one po q pm. Week 4: take 2 po twice daily  for remaining 20 weeks. 630 tablet 0  . oxyCODONE (OXY IR/ROXICODONE) 5 MG immediate release tablet Take 1 tablet (5 mg total) by mouth every 6 (six) hours as needed for severe pain. 65 tablet 0  . PRESCRIPTION MEDICATION Take 1 tablet by mouth as needed (anxiety/sleep).    . prochlorperazine (COMPAZINE) 10 MG tablet Take 1 tablet (10 mg total) by mouth every 6 (six) hours as needed (Nausea or vomiting). 30 tablet 3   No current facility-administered medications for this visit.    PHYSICAL EXAMINATION: ECOG PERFORMANCE STATUS: 1 - Symptomatic but completely  ambulatory  Vitals:   02/21/19 1430  BP: (!) 112/57  Pulse: 91  Resp: 18  Temp: 99.1 F (37.3 C)  SpO2: 97%   Filed Weights   02/21/19 1424  Weight: 162 lb 6.4 oz (73.7 kg)    GENERAL:alert, no distress and comfortable SKIN: no rash EYES:  sclera clear LUNGS:  normal breathing effort Musculoskeletal: trace right medial knee edema with localized tenderness. No erythema, warmth, weakness. She does not bear full weight. No lower leg edema or calf tenderness  NEURO: alert & oriented x 3 with fluent speech  LABORATORY DATA:  I have reviewed the data as listed CBC Latest Ref Rng & Units 02/03/2019 11/25/2018 11/10/2018  WBC 4.0 - 10.5 K/uL 6.6 2.0(L) 3.7(L)  Hemoglobin 12.0 - 15.0 g/dL 13.9 9.0(L) 12.8  Hematocrit 36.0 - 46.0 % 44.8 28.8(L) 40.1  Platelets 150 - 400 K/uL 334 186 319     CMP Latest Ref Rng & Units 02/03/2019 11/25/2018 11/10/2018  Glucose 70 - 99 mg/dL 99 103(H) 111(H)  BUN 6 - 20 mg/dL 24(H) 17 12  Creatinine 0.44 - 1.00 mg/dL 0.94 0.93 0.75  Sodium 135 - 145 mmol/L 144 142 143  Potassium 3.5 - 5.1 mmol/L 4.3 4.2 3.7  Chloride 98 - 111 mmol/L 107 105 106  CO2 22 - 32 mmol/L '27 28 25  ' Calcium 8.9 - 10.3 mg/dL 9.3 9.1 9.4  Total Protein 6.5 - 8.1 g/dL 7.7 6.7 7.5  Total Bilirubin 0.3 - 1.2 mg/dL <0.2(L) <0.2(L) <0.2(L)  Alkaline Phos 38 - 126 U/L 106 81 87  AST 15 - 41 U/L 14(L) 10(L) 12(L)  ALT 0 - 44 U/L '10 9 11      ' RADIOGRAPHIC STUDIES: I have personally reviewed the radiological images as listed and agreed with the findings in the report. No results found.   ASSESSMENT & PLAN: 55 yo female with   1. Acute on chronic right knee pain -She has had this pain intermittently for quite some time. She fell 8+ months ago and re-injured her knee -Xray on 09/28/18 showed degenerative changes.  -she was more active lat week, woke up 1/21 with swollen and sore knee. Painful to bear weight, she required a walker. Improved temporarily by Ice, "joint cream" and  diclofenac cream which she got from a friend -Today her mobility is improved. Medial aspect of right knee appears to have trace edema, no redness or warmth. Rest of the leg is not edematous.  -I feel this is related to degenerative changes in her knee as seen on previous Xray. Low suspicion for gout, infection, DVT. No need for doppler of her leg today. If edema worsens, may get [place] to r/o joint effusion.  -I recommend supportive measures, NSAIDs if needed, and monitor   2. Headaches  -she has h/o chronic headaches, controlled with BC powder but she has not needed any recently until 1 month ago she  developed worsening left side headaches with associated blurriness.  -MRI brain on 02/11/19 showed 10 mm solitary brain met in right frontal lobe, whole brain RT to start 2/1 -She was seen by Dr. 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 She will keep analgesia diary and f/u in 3 months after next brain MRI -will continue monitoring   3.Small cell lung cancer with brain metastasis, stage IV, extensive stage  -Diagnosed in 07/2018.Biopsy confirmed small cell lung cancer.CT AP and MRI brain negative for distant metastasis. PET scan from 09/02/18 shows Hypermetabolic left mediastinal nodal metastasis, but no distant metastasis.  -She completed standard treatment concurrent chemoRTwith cisplatin day 1 and etoposide on days 1-3 from 09/07/18 to 11/10/18. She completed RT from cycle 2 chemo, completed on 12/03/18  -She had n/v, low appetite, and pain on chemoRT but recovered well. -CT CAP from 02/10/19 showed marked reduction of tumor burden in the left hemithorax, no new metastasis in chest, abdomen, or pelvis. She responded well to treatment -She was doing well on observation, but due to worsening headaches and in preparation for PCI she underwent brain MRI on 02/11/19 which showed 6x10 mm solitary brain metastasis in right frontal lobe. SIM'd today. Anticipate  10 fractions starting 2/1.  -f/u with Dr. Burr Medico towards end of radiation  4. Anxiety, depression, irritability, social support  -chronic -followed by PCP, SW. Uses [facility] transportation and currently on medicaid, disability, food stamps.  -mood seems more positive lately  5. Chronic pain -chronic h/o back pain and opioid use  6. COPD, h/o acute left tension pneumo in 07/2018 required oxygen -resolved   7. Goals of care -She knows her new brain metastasis makes her cancer stage IV and carries a poor prognosis. We discussed the aggressive nature of SCLC in general.  -she became tearful at the thought of not surviving her diagnosis today, she wishes to meet a hypothetical grandchild in the future. She mentioned she has a daughter that she put up for adoption when she was born, which she may have unresolved feelings about this.   PLAN: -supportive care for knee pain, NSAIDs, ice -continue with whole brain RT starting 2/1 -Dr. Mickeal Skinner appt today for headaches -F/u with Dr. Burr Medico 2/11 -Refilled baclofen  No problem-specific Assessment & Plan notes found for this encounter.   No orders of the defined types were placed in this encounter.  All questions were answered. The patient knows to call the clinic with any problems, questions or concerns. No barriers to learning was detected.     Alla Feeling, NP 02/21/19

## 2019-02-22 ENCOUNTER — Telehealth: Payer: Self-pay | Admitting: Nurse Practitioner

## 2019-02-22 ENCOUNTER — Telehealth: Payer: Self-pay | Admitting: Internal Medicine

## 2019-02-22 DIAGNOSIS — Z510 Encounter for antineoplastic radiation therapy: Secondary | ICD-10-CM | POA: Diagnosis not present

## 2019-02-22 NOTE — Telephone Encounter (Signed)
No los per 1/25.

## 2019-02-22 NOTE — Telephone Encounter (Signed)
Scheduled appt per 1/25 los.  Spoke with pt and they are aware of the appt date and time.

## 2019-02-25 ENCOUNTER — Telehealth: Payer: Self-pay | Admitting: General Practice

## 2019-02-25 NOTE — Telephone Encounter (Signed)
[place] CSW Progress Notes  Call to patient to follow up on concerns re financial stress.  No answer, left VM w my contact information and encouragement to call back.  Edwyna Shell, LCSW Clinical Social Worker Phone:  [phone] Cell:  [phone]

## 2019-02-28 ENCOUNTER — Ambulatory Visit
Admission: RE | Admit: 2019-02-28 | Discharge: 2019-02-28 | Disposition: A | Discharge status: Home / Self Care | Payer: Medicaid Other | Source: Ambulatory Visit | Attending: Radiation Oncology | Admitting: Radiation Oncology

## 2019-02-28 ENCOUNTER — Other Ambulatory Visit: Payer: Self-pay

## 2019-02-28 DIAGNOSIS — Z510 Encounter for antineoplastic radiation therapy: Secondary | ICD-10-CM | POA: Diagnosis present

## 2019-02-28 DIAGNOSIS — C3412 Malignant neoplasm of upper lobe, left bronchus or lung: Secondary | ICD-10-CM | POA: Diagnosis not present

## 2019-03-01 ENCOUNTER — Ambulatory Visit
Admission: RE | Admit: 2019-03-01 | Discharge: 2019-03-01 | Disposition: A | Discharge status: Home / Self Care | Payer: Medicaid Other | Source: Ambulatory Visit | Attending: Radiation Oncology | Admitting: Radiation Oncology

## 2019-03-01 DIAGNOSIS — Z510 Encounter for antineoplastic radiation therapy: Secondary | ICD-10-CM | POA: Diagnosis not present

## 2019-03-02 ENCOUNTER — Encounter: Payer: Self-pay | Admitting: General Practice

## 2019-03-02 ENCOUNTER — Telehealth: Payer: Self-pay | Admitting: General Practice

## 2019-03-02 ENCOUNTER — Ambulatory Visit
Admission: RE | Admit: 2019-03-02 | Discharge: 2019-03-02 | Disposition: A | Discharge status: Home / Self Care | Payer: Medicaid Other | Source: Ambulatory Visit | Attending: Radiation Oncology | Admitting: Radiation Oncology

## 2019-03-02 DIAGNOSIS — Z510 Encounter for antineoplastic radiation therapy: Secondary | ICD-10-CM | POA: Diagnosis not present

## 2019-03-02 NOTE — Telephone Encounter (Signed)
[place] CSW Progress Notes  Per [employer], patient only qualified for SSI disability.  She is having funds deducted from full amount because "someone else is paying for her food and/or rent so they lowered her monthly amount.  She can appeal that decision and may need to provide documentation that she is responsible for those costs now."  Patient given contact number for [facility] [facility] [phone]. - She needs to call and appeal their determination of her SSI amount.  This must be done by the patient herself.   Patient advised of this.  Edwyna Shell, LCSW Clinical Social Worker Phone:  [phone] Cell  [phone]

## 2019-03-02 NOTE — Progress Notes (Signed)
[place] CSW Progress Notes  Spoke w patient.  She is concerned that her rent is 90% of her current SSI income.  Is working on getting SSI increased as it is below standard rate (she does not understand why).  CSW reached out to [facility] who initially filed her claim, asking them to clarify and give direction to patient.  Discussed need to have more affordable housing, preferably based on her actual income.  Patient declined referrals to [employer] and [employer] [facility] [place], both of which can provide budget counseling/planning and referrals to affordable housing.  States that she wants to remain in her current home where she has 3 bedrooms and can have her cats.  Wanted to know if the is money left on her [place] that could be applied to rent.  SM sent to [facility], financial advocate, who handles these issues.    Edwyna Shell, LCSW Clinical Social Worker Phone:  [phone]

## 2019-03-03 ENCOUNTER — Other Ambulatory Visit: Payer: Self-pay

## 2019-03-03 ENCOUNTER — Ambulatory Visit
Admission: RE | Admit: 2019-03-03 | Discharge: 2019-03-03 | Disposition: A | Discharge status: Home / Self Care | Payer: Medicaid Other | Source: Ambulatory Visit | Attending: Radiation Oncology | Admitting: Radiation Oncology

## 2019-03-03 DIAGNOSIS — Z510 Encounter for antineoplastic radiation therapy: Secondary | ICD-10-CM | POA: Diagnosis not present

## 2019-03-03 NOTE — Progress Notes (Signed)
[place]   [phone] [phone]   Clinic Follow up Note   Patient Care Team: Gildardo Pounds, NP as PCP - General (Nurse Practitioner)  Date of Service:  03/10/2019  CHIEF COMPLAINT: F/u of Small Cell Lung Cancer  SUMMARY OF ONCOLOGIC HISTORY: Oncology History Overview Note  Cancer Staging Small cell lung cancer, left upper lobe ([place]) Staging form: Lung, AJCC 8th Edition - Clinical stage from 08/24/2018: Stage IIIA (cT1a, cN2, cM0) - Signed by [name], MD on 10/17/2018    Small cell lung cancer, left upper lobe ([place])  07/29/2018 Imaging   CT Angio Chest 07/29/18  IMPRESSION: 1. Malignant appearing left mediastinal and hilar lymphadenopathy narrowing the airways at the left hilum. In the left lung only a subtle 9 mm spiculated nodule is identified in the lingula. Consider small cell carcinoma. Bronchoscopy or mediastinal node sampling might be most valuable for diagnosis. 2. Indeterminate although low-density (which typically which indicates a benign adenoma) left adrenal nodule. 3.  Negative for acute pulmonary embolus.   08/24/2018 Definitive Surgery   Bronchoscopy and EBUS biopsy on 08/24/18 by Dr. Loanne Drilling   08/24/2018 Initial Biopsy   Diagnosis 08/24/18 FINE NEEDLE ASPIRATION, ENDOSCOPIC, (EBUS) STATION # 7 LYMPH NODE(SPECIMEN 1 OF 4 COLLECTED 08/24/18): NO MALIGNANT CELLS IDENTIFIED.  FINE NEEDLE ASPIRATION, ENDOSCOPIC, (EBUS) LEFT HILAR LYMPH NODE(SPECIMEN 2 OF 4 COLLECTED 08/24/18): NO MALIGNANT CELLS IDENTIFIED.  TRANSBRONCHIAL NEEDLE ASPIRATION, WANG, (SPECIMEN 3 OF 4 COLLECTED 08/24/18): NO MALIGNANT CELLS IDENTIFIED.  BRONCHIAL BRUSHING, LUL (SPECIMEN 4 OF 4 COLLECTED 08/24/18): MALIGNANT CELLS PRESENT, MOST CONSISTENT WITH SMALL CELL CARCINOMA. SEE COMMENT.   08/24/2018 Cancer Staging   Staging form: Lung, AJCC 8th Edition - Clinical stage from 08/24/2018: Stage IIIA (cT1a, cN2, cM0) - Signed by [name], MD on 10/17/2018     08/26/2018 Initial Diagnosis   Small cell lung cancer, left ([place])   08/26/2018 Imaging   CT AP W Contrast 08/26/18  IMPRESSION: 1. 2.9 cm left adrenal nodule cannot be definitively characterized on this study. Potentially a lipid poor adenoma, but metastatic disease not excluded. MRI of the abdomen with and without contrast may prove helpful to further evaluate. 2. No other findings to suggest metastatic disease in the abdomen/pelvis. 3. Left base atelectasis with small left pleural effusion.   08/27/2018 Imaging   MRI brain 08/27/18  IMPRESSION: 1. Motion degraded, incomplete examination. 2. Subcentimeter focus of diffusion signal abnormality in the left cerebellum. No enhancement is evident to strongly suggest a metastasis, and this may reflect an acute to subacute small vessel infarct. 3. No evidence of intracranial metastatic disease elsewhere. 4. Given motion artifact and incomplete postcontrast imaging, consider short-term follow-up MRI (possibly with sedation) to further evaluate the left cerebellar abnormality and provide a more thorough evaluation for metastatic disease.   09/02/2018 PET scan   IMPRESSION: 1. Hypermetabolic left hilar mass is identified encasing the left mainstem bronchus resulting in postobstructive atelectasis in the left upper lobe. 2. Hypermetabolic left mediastinal nodal metastasis. 3. Loculated left pleural effusion is identified with mild increased FDG uptake. Cannot rule out malignant pleural effusion. 4. No evidence for metastatic disease to the abdomen or pelvis or skeletal structures.     09/07/2018 - 11/10/2018 Chemotherapy   Concurrent chemoRT with IV cisplatin on day 1 and etopside day 1-3 for every 3 weeks starting 8/11. Will start radiation with Dr. Lisbeth Renshaw with cycle 2. She completed 4 cycles on 11/10/18   09/14/2018 Procedure   Thoracentesis yielding 750 mL  09/28/2018 - 12/09/2019 Radiation Therapy   Concurrent chemoRT with Dr. Lisbeth Renshaw.  Start with cycle 2 chemo on 09/28/18 and completed on 12/09/18.    02/10/2019 Imaging   CT CAP W Contrast  IMPRESSION: 1. Interval response to therapy. Significant interval reduction in tumor burden involving the left hemithorax. 2. No new or progressive disease identified within the chest, abdomen or pelvis. 3. Emphysema and aortic atherosclerosis. 4. Stable left adrenal nodule.   Aortic Atherosclerosis (ICD10-I70.0) and Emphysema (ICD10-J43.9).   02/11/2019 Imaging   MRI Brain  IMPRESSION: 6 x 10 mm solitary metastatic deposit right frontal lobe with minimal edema.   02/28/2019 - 03/14/2019 Radiation Therapy   Target Brain Radiation with Dr. Lisbeth Renshaw       CURRENT THERAPY:  Radiation to Brain by Dr. Lisbeth Renshaw 02/28/19-03/14/19  INTERVAL HISTORY:  Marissa [name] is here for a follow up. She notes she has been very nauseous since yesterday that she did not go to her RT. She notes she has taken several compazine and lost her Zofran. She notes with reduced screen time her HA's have improved. She notes she was seen by Dr. Mickeal Skinner who notes her HA are related to her high dose pain meds. She notes her main pain is in her chest and under her right breast. She is on Oxycodone 79m. She tries to take 2 tabs daily but can take up to 6 tabs depending on her activity level. She has been taking 2 Cymbalta a day but 2 tabs is not helping her. She is on Gabapentin 3086m1 tab in the AM and 2 tabs in the PM. She notes laying flat does not help her sleep well. She notes her hair is growing back since being off chemo and even during RT.  She notes she has reduced her smoking to 2-3 cigarettes a day.     REVIEW OF SYSTEMS:   Constitutional: Denies fevers, chills or abnormal weight loss (+) trouble sleeping Eyes: Denies blurriness of vision Ears, nose, mouth, throat, and face: Denies mucositis or sore throat Respiratory: Denies cough, dyspnea or wheezes (+) right lower chest pain Cardiovascular: Denies palpitation,  chest discomfort or lower extremity swelling Gastrointestinal:  Denies heartburn or change in bowel habits (+) Significant nausea  Skin: Denies abnormal skin rashes Lymphatics: Denies new lymphadenopathy or easy bruising Neurological:Denies numbness, tingling or new weaknesses Behavioral/Psych: Mood is stable, no new changes  All other systems were reviewed with the patient and are negative.  MEDICAL HISTORY:  Past Medical History:  Diagnosis Date  . Asthma   . Chronic headaches   . COPD [place] obstructive [place] disease) ([place]  . Depression   . Heart murmur   . Small cell lung cancer ([facility]    SURGICAL HISTORY: Past Surgical History:  Procedure Laterality Date  . BRONCHIAL BIOPSY  08/24/2018   Procedure: BRONCHIAL BIOPSIES;  Surgeon: ElMargaretha SeedsMD;  [facility];  Service: Cardiopulmonary;;  . BRONCHIAL BRUSHINGS  08/24/2018   Procedure: BRONCHIAL BRUSHINGS;  Surgeon: ElMargaretha SeedsMD;  [facility];  Service: Cardiopulmonary;;  . BRONCHIAL NEEDLE ASPIRATION BIOPSY  08/24/2018   Procedure: BRONCHIAL NEEDLE ASPIRATION BIOPSIES;  Surgeon: ElMargaretha SeedsMD;  [facility];  Service: Cardiopulmonary;;  . ENDOBRONCHIAL ULTRASOUND N/A 08/24/2018   Procedure: ENDOBRONCHIAL ULTRASOUND;  Surgeon: ElMargaretha SeedsMD;  Location: [facility];  Service: Cardiopulmonary;  Laterality: N/A;  . IR THORACENTESIS ASP PLEURAL SPACE W/IMG GUIDE  09/14/2018  . TUBAL LIGATION    .  VIDEO BRONCHOSCOPY  08/24/2018   Procedure: VIDEO BRONCHOSCOPY;  Surgeon: Margaretha Seeds, MD;  [facility];  Service: Cardiopulmonary;;    I have reviewed the social history and family history with the patient and they are unchanged from previous note.  ALLERGIES:  is allergic to penicillins.  MEDICATIONS:  Current Outpatient Medications  Medication Sig Dispense Refill  . baclofen (LIORESAL) 10 MG tablet Take 1 tablet (10 mg total) by mouth 3 (three)  times daily as needed for muscle spasms. 30 each 0  . diazepam (VALIUM) 10 MG tablet Take 1 tablet po 30 minutes prior to MRI or radiation 10 tablet 0  . DULoxetine (CYMBALTA) 20 MG capsule Take 2 capsules (40 mg total) by mouth daily. Take 1 capsule by mouth daily for the first week, then increase to 2 caps daily 60 capsule 1  . gabapentin (NEURONTIN) 300 MG capsule Take 1 tab in morning and 2 tabs at bedtime 90 capsule 2  . LORazepam (ATIVAN) 1 MG tablet Take 1 tablet (1 mg total) by mouth every 12 (twelve) hours as needed for anxiety. 30 tablet 0  . oxyCODONE (OXY IR/ROXICODONE) 5 MG immediate release tablet Take 1 tablet (5 mg total) by mouth every 6 (six) hours as needed for severe pain. 65 tablet 0  . prochlorperazine (COMPAZINE) 10 MG tablet Take 1 tablet (10 mg total) by mouth every 6 (six) hours as needed (Nausea or vomiting). 30 tablet 3  . promethazine (PHENERGAN) 25 MG tablet Take 1 tablet (25 mg total) by mouth every 6 (six) hours as needed for nausea or vomiting. 30 tablet 0   No current facility-administered medications for this visit.    PHYSICAL EXAMINATION: ECOG PERFORMANCE STATUS: 1 - Symptomatic but completely ambulatory  Vitals:   03/10/19 1035  BP: 124/62  Pulse: 92  Resp: 18  Temp: 98.3 F (36.8 C)  SpO2: 95%   Filed Weights   03/10/19 1035  Weight: 159 lb 4.8 oz (72.3 kg)    Due to COVID19 we will limit examination to appearance. Patient had no complaints.  GENERAL:alert, no distress and comfortable SKIN: skin color normal, no rashes or significant lesions EYES: normal, Conjunctiva are pink and non-injected, sclera clear  NEURO: alert & oriented x 3 with fluent speech   LABORATORY DATA:  I have reviewed the data as listed CBC Latest Ref Rng & Units 02/03/2019 11/25/2018 11/10/2018  WBC 4.0 - 10.5 K/uL 6.6 2.0(L) 3.7(L)  Hemoglobin 12.0 - 15.0 g/dL 13.9 9.0(L) 12.8  Hematocrit 36.0 - 46.0 % 44.8 28.8(L) 40.1  Platelets 150 - 400 K/uL 334 186 319      CMP Latest Ref Rng & Units 02/03/2019 11/25/2018 11/10/2018  Glucose 70 - 99 mg/dL 99 103(H) 111(H)  BUN 6 - 20 mg/dL 24(H) 17 12  Creatinine 0.44 - 1.00 mg/dL 0.94 0.93 0.75  Sodium 135 - 145 mmol/L 144 142 143  Potassium 3.5 - 5.1 mmol/L 4.3 4.2 3.7  Chloride 98 - 111 mmol/L 107 105 106  CO2 22 - 32 mmol/L '27 28 25  ' Calcium 8.9 - 10.3 mg/dL 9.3 9.1 9.4  Total Protein 6.5 - 8.1 g/dL 7.7 6.7 7.5  Total Bilirubin 0.3 - 1.2 mg/dL <0.2(L) <0.2(L) <0.2(L)  Alkaline Phos 38 - 126 U/L 106 81 87  AST 15 - 41 U/L 14(L) 10(L) 12(L)  ALT 0 - 44 U/L '10 9 11      ' RADIOGRAPHIC STUDIES: I have personally reviewed the radiological images as listed and agreed with  the findings in the report. No results found.   ASSESSMENT & PLAN:  Marissa [name] is a 54 y.o. female with    1.Small cell lung cancer, limited stage, new brain metastasis in 01/2019 -She was diagnosed in 07/2018. Imaging showed 40m nodule of left lung with malignant appearing left mediastinal and hilar lymphadenopathy. Her Bronchoscopy biopsy confirmed mass to be small cell lung cancer while her left hilar andmediastinal lymph node biopsy were negative for malignant cells.  -CT AP and MRI brain negative for distant metastasis. PET scan from 09/02/18 shows Hypermetabolic left mediastinal nodal metastasis, but no distant metastasis.  -Given her locally advanced disease (stage III cancer), she recently completed standard treatment with concurrent chemoRT with 4 cycles of cisplatin/etopside. -Her CT CAP from 02/10/19 showed marked reduction of tumor burden in the left hemithorax, no new metastasis in chest, abdomen, or pelvis. She responded well to treatment.  -Unfortunately her Brain MRI from 02/11/19 showed 6x10 mm solitary brain metastasis in right frontal lobe. -Her cancer is now stage IV and no longer curable but still treatable. Goal of care is palliative to control disease and prolong her life.  -She started radiation to brain on  02/28/19 with Dr. MLisbeth Renshaw Plan to complete on 03/14/19.  -will continue observation after brain radiation -Will restart her on chemo treatment if her cancer progresses again. [facility]  -I will f/u with her in 1 month, will order CT scan at that time. She will also have MRI brain after RT.    2. Headaches  -she has h/o chronic headaches, controlled with BC powder but she has not needed any recently until 1 month ago she developed worsening left side headaches with associated blurriness.  -MRI brain on 02/11/19 showed 10 mm solitary brain met in right frontal lobe, whole brain RT to start 2/1 -She was seen by Dr. 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 She has been able to decrease her oxycodone 128mto 2-6 tabs daily. She also has reduced her screen time which has helped.  -She will repeat her Brain MRI after RT. Continue to monitor.   3. Smoking Cessation, COPD,H/oacute left tension pneumothorax in 07/2018 after bronchoscopy and EBUS biopsy (resolved) -She has a h/o heavy smoking. She has COPD. She is no longer on oxygen.  -She has reduced her smoking to 2-3 cigarettes a day. I highly advised her to quit completely. She is still trying.   4. Severe LUQ and left flank pain, right knee pain  -She had chest tube placed on left upper back. S/p removal she has had severe radiating LUQ pain.  -Ipreviouslyreferred her to Dr. HaDonell Sievertffice for pain management.Although her Medicaid has been approved, she cannot afford to see a specialist currently. -She has had this pain intermittently for quite some time. She fell 8+ months ago and re-injured her knee. Xray on 09/28/18 showed degenerative changes.  -Her main pain is right lower chest currently. She has reduced pain meds to Oxycodone 1025m-6 tabs daily depending on her activity level. I recommend she continue to cut back further to no more then 2 tabs daily and  try to not take it if needed.  -She can continue to use Cymbalta TID and Gabapentin 300m25m the AM, 600mg55mPM.   5. Iron deficient anemia  -Developed during recent hospitalization.  -Treated with 2 IV Iron doses in 07/2018. Anemia has improved  6.Nausea, vomiting, poor  appetite -She notes still having a low appetite. She has not gained back the weight she lost since cancer diagnosis.  -She has been taking marijuana as needed to help her taste and help her eat.  -Her nausea has worsened during brain RT. She has compazine which is not enough. She plans to try her Zofran and I will call in Phenergan today (03/10/19)  7. Anxiety/Depression, irritability, Social Support  -She has a h/o of anxiety and depression and was previously on antidepressants but did not tolerate well. -In 11/2018 she was very irritable and anxious when being around people and felt she could hurt someone. Her mood has much improved on Ativan as needed. She also takes Valium.  -She notes she uses Marijuana to help her eat, but this does not irritate her. She is on Moderate to high dose oxycodone. She denies any other use of recreational drugs.  -We also previously referred her to [facility]. She will continue SW involvement. She has not seen [facility] provider yet.  -Currently on Medicaid, disability, food stamps. She gets $529 per month. Still does not have a car  -mood seems more positive lately   8. Goals of care -She knows her new brain metastasis makes her cancer stage IV and carries a poor prognosis.  -goal of her treatment is palliative  -she is full code now    PLAN: -I called in Phenergan today  -Continue RT, she will finish next week  -Lab and F/u in 1 month   No problem-specific Assessment & Plan notes found for this encounter.   No orders of the defined types were placed in this encounter.  All questions were answered. The patient knows to call the clinic with any problems,  questions or concerns. No barriers to learning was detected. The total time spent in the appointment was 30 minutes.     [name], MD 03/10/2019   I, Joslyn Devon, am acting as scribe for [name], MD.   I have reviewed the above documentation for accuracy and completeness, and I agree with the above.

## 2019-03-04 ENCOUNTER — Ambulatory Visit
Admission: RE | Admit: 2019-03-04 | Discharge: 2019-03-04 | Disposition: A | Discharge status: Home / Self Care | Payer: Medicaid Other | Source: Ambulatory Visit | Attending: Radiation Oncology | Admitting: Radiation Oncology

## 2019-03-04 DIAGNOSIS — Z510 Encounter for antineoplastic radiation therapy: Secondary | ICD-10-CM | POA: Diagnosis not present

## 2019-03-07 ENCOUNTER — Ambulatory Visit
Admission: RE | Admit: 2019-03-07 | Discharge: 2019-03-07 | Disposition: A | Discharge status: Home / Self Care | Payer: Medicaid Other | Source: Ambulatory Visit | Attending: Radiation Oncology | Admitting: Radiation Oncology

## 2019-03-07 ENCOUNTER — Other Ambulatory Visit: Payer: Self-pay

## 2019-03-07 DIAGNOSIS — Z510 Encounter for antineoplastic radiation therapy: Secondary | ICD-10-CM | POA: Diagnosis not present

## 2019-03-08 ENCOUNTER — Other Ambulatory Visit: Payer: Self-pay

## 2019-03-08 ENCOUNTER — Ambulatory Visit
Admission: RE | Admit: 2019-03-08 | Discharge: 2019-03-08 | Disposition: A | Discharge status: Home / Self Care | Payer: Medicaid Other | Source: Ambulatory Visit | Attending: Radiation Oncology | Admitting: Radiation Oncology

## 2019-03-08 DIAGNOSIS — Z510 Encounter for antineoplastic radiation therapy: Secondary | ICD-10-CM | POA: Diagnosis not present

## 2019-03-09 ENCOUNTER — Ambulatory Visit: Admission: RE | Admit: 2019-03-09 | Payer: Medicaid Other | Source: Ambulatory Visit

## 2019-03-10 ENCOUNTER — Encounter: Payer: Self-pay | Admitting: Hematology

## 2019-03-10 ENCOUNTER — Ambulatory Visit
Admission: RE | Admit: 2019-03-10 | Discharge: 2019-03-10 | Disposition: A | Discharge status: Home / Self Care | Payer: Medicaid Other | Source: Ambulatory Visit | Attending: Radiation Oncology | Admitting: Radiation Oncology

## 2019-03-10 ENCOUNTER — Other Ambulatory Visit: Payer: Self-pay

## 2019-03-10 ENCOUNTER — Inpatient Hospital Stay: Payer: Medicaid Other | Attending: Hematology | Admitting: Hematology

## 2019-03-10 ENCOUNTER — Telehealth: Payer: Self-pay | Admitting: Hematology

## 2019-03-10 VITALS — BP 124/62 | HR 92 | Temp 98.3°F | Resp 18 | Ht 62.0 in | Wt 159.3 lb

## 2019-03-10 DIAGNOSIS — D509 Iron deficiency anemia, unspecified: Secondary | ICD-10-CM | POA: Insufficient documentation

## 2019-03-10 DIAGNOSIS — F17210 Nicotine dependence, cigarettes, uncomplicated: Secondary | ICD-10-CM | POA: Insufficient documentation

## 2019-03-10 DIAGNOSIS — C3412 Malignant neoplasm of upper lobe, left bronchus or lung: Secondary | ICD-10-CM | POA: Diagnosis present

## 2019-03-10 DIAGNOSIS — F1290 Cannabis use, unspecified, uncomplicated: Secondary | ICD-10-CM | POA: Diagnosis not present

## 2019-03-10 DIAGNOSIS — C771 Secondary and unspecified malignant neoplasm of intrathoracic lymph nodes: Secondary | ICD-10-CM | POA: Insufficient documentation

## 2019-03-10 DIAGNOSIS — C7931 Secondary malignant neoplasm of brain: Secondary | ICD-10-CM

## 2019-03-10 DIAGNOSIS — I700 Atherosclerosis of aorta: Secondary | ICD-10-CM | POA: Insufficient documentation

## 2019-03-10 DIAGNOSIS — Z79899 Other long term (current) drug therapy: Secondary | ICD-10-CM | POA: Insufficient documentation

## 2019-03-10 DIAGNOSIS — Z510 Encounter for antineoplastic radiation therapy: Secondary | ICD-10-CM | POA: Diagnosis not present

## 2019-03-10 MED ORDER — PROMETHAZINE HCL 25 MG PO TABS: 25.0000 mg | ORAL_TABLET | Freq: Four times a day (QID) | ORAL | 0 refills | Status: DC | PRN

## 2019-03-10 MED FILL — PROMETHAZINE 25 MG TABLET: 25 | 7 days supply | Qty: 30 | Fill #0

## 2019-03-10 NOTE — Telephone Encounter (Signed)
Scheduled appt per 2/11 los.  Spoke with pt and she is aware of her appt date and time,

## 2019-03-11 ENCOUNTER — Ambulatory Visit
Admission: RE | Admit: 2019-03-11 | Discharge: 2019-03-11 | Disposition: A | Discharge status: Home / Self Care | Payer: Medicaid Other | Source: Ambulatory Visit | Attending: Radiation Oncology | Admitting: Radiation Oncology

## 2019-03-11 ENCOUNTER — Ambulatory Visit: Payer: Medicaid Other

## 2019-03-11 DIAGNOSIS — Z510 Encounter for antineoplastic radiation therapy: Secondary | ICD-10-CM | POA: Diagnosis not present

## 2019-03-14 ENCOUNTER — Encounter: Payer: Self-pay | Admitting: Radiation Oncology

## 2019-03-14 ENCOUNTER — Ambulatory Visit
Admission: RE | Admit: 2019-03-14 | Discharge: 2019-03-14 | Disposition: A | Discharge status: Home / Self Care | Payer: Medicaid Other | Source: Ambulatory Visit | Attending: Radiation Oncology | Admitting: Radiation Oncology

## 2019-03-14 ENCOUNTER — Other Ambulatory Visit: Payer: Self-pay

## 2019-03-14 DIAGNOSIS — Z510 Encounter for antineoplastic radiation therapy: Secondary | ICD-10-CM | POA: Diagnosis not present

## 2019-03-15 NOTE — Progress Notes (Signed)
  Radiation Oncology         [phone] ________________________________  Name: Marissa [name] MRN: [id number]  Date: 12/09/2018  DOB: 09-Apr-1965  End of Treatment Note  Diagnosis:  Lung cancer     Indication for treatment::  curative       Radiation treatment dates:   09/28/18 - 12/09/18  Site/dose:   The patient was treated to the disease within the left lung initially to a dose of 60 Gy using a 4 field, 3-D conformal technique. The patient then received a cone down boost treatment for an additional 6 Gy. This yielded a final total dose of 66 Gy.   Narrative: The patient tolerated radiation treatment relatively well.   The patient did experience esophagitis during the course of treatment which required management.   Plan: The patient has completed radiation treatment. The patient will return to radiation oncology clinic for routine followup in one month. I advised the patient to call or return sooner if they have any questions or concerns related to their recovery or treatment. ________________________________  Jodelle Gross, M.D., Ph.D.

## 2019-03-23 ENCOUNTER — Other Ambulatory Visit: Payer: Self-pay

## 2019-03-23 ENCOUNTER — Other Ambulatory Visit: Payer: Self-pay | Admitting: Hematology

## 2019-03-23 DIAGNOSIS — M25512 Pain in left shoulder: Secondary | ICD-10-CM

## 2019-03-23 DIAGNOSIS — C3492 Malignant neoplasm of unspecified part of left bronchus or lung: Secondary | ICD-10-CM

## 2019-03-23 MED ORDER — OXYCODONE HCL 5 MG PO TABS: 5.0000 mg | ORAL_TABLET | Freq: Two times a day (BID) | ORAL | 0 refills | Status: DC | PRN

## 2019-03-24 MED FILL — oxyCODONE HCL 5 MG TABS: 5 | 15 days supply | Qty: 30 | Fill #0

## 2019-03-24 MED FILL — PROMETHAZINE 25 MG TABLET: 25 | 7 days supply | Qty: 30 | Fill #0

## 2019-04-02 NOTE — Progress Notes (Signed)
  Radiation Oncology         [phone] ________________________________  Name: Loriene Taunton MRN: [id number]  Date: 02/21/2019  DOB: [birth year]    Simulation and treatment planning note  DIAGNOSIS:     ICD-10-CM   1. Brain metastases ([place])  C79.31      The patient presented for simulation for the patient's upcoming course of whole brain radiation treatment. The patient was placed in a supine position and a customized thermoplastic head cast was constructed to aid in patient immobilization during the treatment. This complex treatment device will be used on a daily basis. In this fashion a CT scan was obtained through the head and neck region and isocenter was placed near midline within the brain.  The patient will be planned to receive a course of whole brain radiation treatment to a dose of 30 gray in 10 fractions at 3 gray per fraction. To accomplish this, 2 customized blocks have been designed which corresponds to left and right whole brain radiation fields. These 2 complex treatment devices will be used on a daily basis during the course of radiation. A complex isodose plan is requested to insure that the target area is adequately covered in to facilitate optimization of the treatment plan. A forward planning technique will also be evaluated to determine if this approach significantly improves the plan.   ________________________________   Jodelle Gross, MD, PhD

## 2019-04-04 ENCOUNTER — Telehealth: Payer: Self-pay | Admitting: Radiation Oncology

## 2019-04-04 NOTE — Telephone Encounter (Signed)
  Radiation Oncology         [phone] ________________________________  Name: Marissa [name] MRN: [id number]  Date of Service: 04/04/2019  DOB: 02/12/1965  Post Treatment Telephone Note  Diagnosis:   Metastatic, initially diagnosed as limited stage small cell carcinoma of the left lingula with brain metastasis  Interval Since Last Radiation:  3 weeks   02/28/19-03/14/19:  The whole brain was treated to 30 Gy in 10 fractions.  09/28/2018-12/09/2018: The left lingula was treated to 60Gy in 28 fractions followed by a 6 Gy boost in 3 fractions.    Narrative:  The patient was contacted today for routine follow-up. During treatment she did very well with radiotherapy and did not have significant desquamation.   Impression/Plan: 1. Metastatic, initially diagnosed as limited stage small cell carcinoma of the left lingula with brain metastasis. I was unable to reach the patient by phone and her phone does not go to a voicemail box. We will try to coordinate with her but she will need an MRI of the brain in about 2 months for surveillance.    Carola Rhine, PAC

## 2019-04-04 NOTE — Telephone Encounter (Signed)
Error duplicate documentation

## 2019-04-07 ENCOUNTER — Inpatient Hospital Stay: Payer: Medicaid Other

## 2019-04-07 ENCOUNTER — Inpatient Hospital Stay: Payer: Medicaid Other | Attending: Hematology | Admitting: Hematology

## 2019-04-07 ENCOUNTER — Telehealth: Payer: Self-pay

## 2019-04-07 NOTE — Progress Notes (Signed)
  Radiation Oncology         [phone] ________________________________  Name: Marissa [name] MRN: [id number]  Date: 03/14/2019  DOB: February 17, 1965  End of Treatment Note  Diagnosis:   brain metastasis   Indication for treatment::  palliative       Radiation treatment dates:   02/28/19 - 03/14/19  Site/dose:   The patient was treated with a course of whole brain radiation therapy to a dose of 30 Gy in 10 fractions.  This was accomplished using a 2 field technique with additional reduced fields as necessary to improve dose homogeneity.  Narrative: The patient tolerated radiation treatment relatively well.   No unexpected difficulties in terms of acute toxicity.  The skin held up to treatment fairly well.  Plan: The patient has completed radiation treatment. The patient will return to radiation oncology clinic for routine followup in one month. I advised the patient to call or return sooner if they have any questions or concerns related to their recovery or treatment. ________________________________  Jodelle Gross, M.D., Ph.D.

## 2019-04-07 NOTE — Telephone Encounter (Signed)
Left vm on Marissa [name]'s phone requesting a call back regarding her missed appointment.

## 2019-04-19 ENCOUNTER — Other Ambulatory Visit: Payer: Self-pay | Admitting: Hematology

## 2019-04-19 ENCOUNTER — Telehealth: Payer: Self-pay | Admitting: Hematology

## 2019-04-19 DIAGNOSIS — C3492 Malignant neoplasm of unspecified part of left bronchus or lung: Secondary | ICD-10-CM

## 2019-04-19 DIAGNOSIS — M25512 Pain in left shoulder: Secondary | ICD-10-CM

## 2019-04-19 NOTE — Telephone Encounter (Signed)
Scheduled apt per 3/23 sch message - unable to reach pt - left message with appt date and time

## 2019-04-19 NOTE — Telephone Encounter (Signed)
Returned patient's vm from yesterday. I left vm stating she missed her 3/11 appt and we would be rescheduling her.  Scheduling message sent.

## 2019-04-20 NOTE — Telephone Encounter (Signed)
refill 

## 2019-04-25 ENCOUNTER — Telehealth: Payer: Self-pay

## 2019-04-25 ENCOUNTER — Other Ambulatory Visit: Payer: Self-pay | Admitting: Hematology

## 2019-04-25 DIAGNOSIS — M25512 Pain in left shoulder: Secondary | ICD-10-CM

## 2019-04-25 DIAGNOSIS — C3492 Malignant neoplasm of unspecified part of left bronchus or lung: Secondary | ICD-10-CM

## 2019-04-25 NOTE — Telephone Encounter (Signed)
Dr. Burr Medico denied refill for oxy.

## 2019-04-26 NOTE — Telephone Encounter (Signed)
Hi Dr. Burr Medico, Looks like you refused this 4 days ago so you might want to refuse again.  Thanks! Threasa Beards

## 2019-04-28 ENCOUNTER — Other Ambulatory Visit: Payer: Self-pay | Admitting: Hematology

## 2019-04-28 DIAGNOSIS — C3492 Malignant neoplasm of unspecified part of left bronchus or lung: Secondary | ICD-10-CM

## 2019-04-28 DIAGNOSIS — M25512 Pain in left shoulder: Secondary | ICD-10-CM

## 2019-04-28 MED ORDER — OXYCODONE HCL 5 MG PO TABS: 5.0000 mg | ORAL_TABLET | Freq: Two times a day (BID) | ORAL | 0 refills | Status: DC | PRN

## 2019-04-28 MED FILL — oxyCODONE HCL 5 MG TABS: 5 | 5 days supply | Qty: 10 | Fill #0

## 2019-04-28 NOTE — Telephone Encounter (Signed)
I left a vm for Marissa [name] stating that Dr. Burr Medico would give her enough pain medicine to last until her appt 05/05/2019.

## 2019-05-02 NOTE — Progress Notes (Signed)
Marissa [name]   [phone] [phone]   Clinic Follow up Note   Patient Care Team: Gildardo Pounds, NP as PCP - General (Nurse Practitioner)  Date of Service:  05/05/2019  CHIEF COMPLAINT: F/u of Small Cell Lung Cancer  SUMMARY OF ONCOLOGIC HISTORY: Oncology History Overview Note  Cancer Staging Small cell lung cancer, left upper lobe ([place]) Staging form: Lung, AJCC 8th Edition - Clinical stage from 08/24/2018: Stage IIIA (cT1a, cN2, cM0) - Signed by [name], MD on 10/17/2018    Small cell lung cancer, left upper lobe ([place])  07/29/2018 Imaging   CT Angio Chest 07/29/18  IMPRESSION: 1. Malignant appearing left mediastinal and hilar lymphadenopathy narrowing the airways at the left hilum. In the left lung only a subtle 9 mm spiculated nodule is identified in the lingula. Consider small cell carcinoma. Bronchoscopy or mediastinal node sampling might be most valuable for diagnosis. 2. Indeterminate although low-density (which typically which indicates a benign adenoma) left adrenal nodule. 3.  Negative for acute pulmonary embolus.   08/24/2018 Definitive Surgery   Bronchoscopy and EBUS biopsy on 08/24/18 by Dr. Loanne Drilling   08/24/2018 Initial Biopsy   Diagnosis 08/24/18 FINE NEEDLE ASPIRATION, ENDOSCOPIC, (EBUS) STATION # 7 LYMPH NODE(SPECIMEN 1 OF 4 COLLECTED 08/24/18): NO MALIGNANT CELLS IDENTIFIED.  FINE NEEDLE ASPIRATION, ENDOSCOPIC, (EBUS) LEFT HILAR LYMPH NODE(SPECIMEN 2 OF 4 COLLECTED 08/24/18): NO MALIGNANT CELLS IDENTIFIED.  TRANSBRONCHIAL NEEDLE ASPIRATION, WANG, (SPECIMEN 3 OF 4 COLLECTED 08/24/18): NO MALIGNANT CELLS IDENTIFIED.  BRONCHIAL BRUSHING, LUL (SPECIMEN 4 OF 4 COLLECTED 08/24/18): MALIGNANT CELLS PRESENT, MOST CONSISTENT WITH SMALL CELL CARCINOMA. SEE COMMENT.   08/24/2018 Cancer Staging   Staging form: Lung, AJCC 8th Edition - Clinical stage from 08/24/2018: Stage IIIA (cT1a, cN2, cM0) - Signed by [name], MD on 10/17/2018     08/26/2018 Initial Diagnosis   Small cell lung cancer, left ([place])   08/26/2018 Imaging   CT AP W Contrast 08/26/18  IMPRESSION: 1. 2.9 cm left adrenal nodule cannot be definitively characterized on this study. Potentially a lipid poor adenoma, but metastatic disease not excluded. MRI of the abdomen with and without contrast may prove helpful to further evaluate. 2. No other findings to suggest metastatic disease in the abdomen/pelvis. 3. Left base atelectasis with small left pleural effusion.   08/27/2018 Imaging   MRI brain 08/27/18  IMPRESSION: 1. Motion degraded, incomplete examination. 2. Subcentimeter focus of diffusion signal abnormality in the left cerebellum. No enhancement is evident to strongly suggest a metastasis, and this may reflect an acute to subacute small vessel infarct. 3. No evidence of intracranial metastatic disease elsewhere. 4. Given motion artifact and incomplete postcontrast imaging, consider short-term follow-up MRI (possibly with sedation) to further evaluate the left cerebellar abnormality and provide a more thorough evaluation for metastatic disease.   09/02/2018 PET scan   IMPRESSION: 1. Hypermetabolic left hilar mass is identified encasing the left mainstem bronchus resulting in postobstructive atelectasis in the left upper lobe. 2. Hypermetabolic left mediastinal nodal metastasis. 3. Loculated left pleural effusion is identified with mild increased FDG uptake. Cannot rule out malignant pleural effusion. 4. No evidence for metastatic disease to the abdomen or pelvis or skeletal structures.     09/07/2018 - 11/10/2018 Chemotherapy   Concurrent chemoRT with IV cisplatin on day 1 and etopside day 1-3 for every 3 weeks starting 8/11. Will start radiation with Dr. Lisbeth Renshaw with cycle 2. She completed 4 cycles on 11/10/18   09/14/2018 Procedure   Thoracentesis yielding 750 mL  09/28/2018 - 12/09/2019 Radiation Therapy   Concurrent chemoRT with Dr. Lisbeth Renshaw.  Start with cycle 2 chemo on 09/28/18 and completed on 12/09/18.    02/10/2019 Imaging   CT CAP W Contrast  IMPRESSION: 1. Interval response to therapy. Significant interval reduction in tumor burden involving the left hemithorax. 2. No new or progressive disease identified within the chest, abdomen or pelvis. 3. Emphysema and aortic atherosclerosis. 4. Stable left adrenal nodule.   Aortic Atherosclerosis (ICD10-I70.0) and Emphysema (ICD10-J43.9).   02/11/2019 Imaging   MRI Brain  IMPRESSION: 6 x 10 mm solitary metastatic deposit right frontal lobe with minimal edema.   02/28/2019 - 03/14/2019 Radiation Therapy   Target Brain Radiation with Dr. Lisbeth Renshaw       CURRENT THERAPY:  Observation  INTERVAL HISTORY:  Marissa [name] is here for a follow up. She presents to the clinic alone. She notes she has been more fatigued, SOB and having to do more breathing treatments. She notes this has been going on for few weeks. She notes she has dry cough for the past 2 days. She does have clear phlegm. She only had drops of blood with onset. She notes right chest pain and right scapula pain when she takes a deep breath. She notes she reduced her smoking to 10 cigarettes a day. She takes Ativan daily to keep her calm.    REVIEW OF SYSTEMS:   Constitutional: Denies fevers, chills or abnormal weight loss Eyes: Denies blurriness of vision Ears, nose, mouth, throat, and face: Denies mucositis or sore throat Respiratory: (+) Mild productive cough (+) Right chest and scapula pain (+) SOB Cardiovascular: Denies palpitation, chest discomfort or lower extremity swelling Gastrointestinal:  Denies nausea, heartburn or change in bowel habits Skin: Denies abnormal skin rashes Lymphatics: Denies new lymphadenopathy or easy bruising Neurological:Denies numbness, tingling or new weaknesses Behavioral/Psych: Mood is stable, no new changes  All other systems were reviewed with the patient and are negative.  MEDICAL  HISTORY:  Past Medical History:  Diagnosis Date   Asthma    Chronic headaches    COPD (chronic obstructive pulmonary disease) ([place])    Depression    Heart murmur    Small cell lung cancer ([place])     SURGICAL HISTORY: Past Surgical History:  Procedure Laterality Date   BRONCHIAL BIOPSY  08/24/2018   Procedure: BRONCHIAL BIOPSIES;  Surgeon: Margaretha Seeds, MD;  Location: [facility];  Service: Cardiopulmonary;;   BRONCHIAL BRUSHINGS  08/24/2018   Procedure: BRONCHIAL BRUSHINGS;  Surgeon: Margaretha Seeds, MD;  Location: [facility];  Service: Cardiopulmonary;;   BRONCHIAL NEEDLE ASPIRATION BIOPSY  08/24/2018   Procedure: BRONCHIAL NEEDLE ASPIRATION BIOPSIES;  Surgeon: Margaretha Seeds, MD;  Location: [facility];  Service: Cardiopulmonary;;   ENDOBRONCHIAL ULTRASOUND N/A 08/24/2018   Procedure: ENDOBRONCHIAL ULTRASOUND;  Surgeon: Margaretha Seeds, MD;  Location: [facility];  Service: Cardiopulmonary;  Laterality: N/A;   IR THORACENTESIS ASP PLEURAL SPACE W/IMG GUIDE  09/14/2018   TUBAL LIGATION     VIDEO BRONCHOSCOPY  08/24/2018   Procedure: VIDEO BRONCHOSCOPY;  Surgeon: Margaretha Seeds, MD;  Location: [facility];  Service: Cardiopulmonary;;    I have reviewed the social history and family history with the patient and they are unchanged from previous note.  ALLERGIES:  is allergic to penicillins.  MEDICATIONS:  Current Outpatient Medications  Medication Sig Dispense Refill   baclofen (LIORESAL) 10 MG tablet Take 1 tablet (10 mg total) by mouth 3 (three) times daily as needed for muscle spasms. [place]  each 0   diazepam (VALIUM) 10 MG tablet Take 1 tablet po 30 minutes prior to MRI or radiation 10 tablet 0   DULoxetine (CYMBALTA) 20 MG capsule Take 2 capsules (40 mg total) by mouth daily. Take 1 capsule by mouth daily for the first week, then increase to 2 caps daily 60 capsule 1   gabapentin (NEURONTIN) 300 MG capsule Take 1 tab in morning and 2 tabs at bedtime  90 capsule 2   LORazepam (ATIVAN) 1 MG tablet Take 1 tablet (1 mg total) by mouth every 12 (twelve) hours as needed for anxiety. 30 tablet 0   oxyCODONE (OXY IR/ROXICODONE) 5 MG immediate release tablet Take 1 tablet (5 mg total) by mouth every 12 (twelve) hours as needed for severe pain. 30 tablet 0   prochlorperazine (COMPAZINE) 10 MG tablet Take 1 tablet (10 mg total) by mouth every 6 (six) hours as needed (Nausea or vomiting). 30 tablet 3   promethazine (PHENERGAN) 25 MG tablet Take 1 tablet (25 mg total) by mouth every 6 (six) hours as needed for nausea or vomiting. 30 tablet 0   No current facility-administered medications for this visit.    PHYSICAL EXAMINATION: ECOG PERFORMANCE STATUS: 2 - Symptomatic, <50% confined to bed  Vitals:   05/05/19 1355 05/05/19 1359  BP: (!) 124/50 122/73  Pulse: 89   Temp: 98.9 F (37.2 C)   SpO2: 100%    Filed Weights   05/05/19 1355  Weight: 161 lb 1.6 oz (73.1 kg)    GENERAL:alert, no distress and comfortable SKIN: skin color, texture, turgor are normal, no rashes or significant lesions EYES: normal, Conjunctiva are pink and non-injected, sclera clear  NECK: supple, thyroid normal size, non-tender, without nodularity LYMPH:  no palpable lymphadenopathy in the cervical, axillary  LUNGS: clear percussion with normal breathing effort (+) Mild wheezes HEART: regular rate & rhythm and no murmurs and no lower extremity edema ABDOMEN:abdomen soft, non-tender and normal bowel sounds (+) RUQ tenderness (+) Hepatomegaly 2-3 cm below ribcage.  Musculoskeletal:no cyanosis of digits and no clubbing  NEURO: alert & oriented x 3 with fluent speech, no focal motor/sensory deficits  LABORATORY DATA:  I have reviewed the data as listed CBC Latest Ref Rng & Units 05/05/2019 02/03/2019 11/25/2018  WBC 4.0 - 10.5 K/uL 6.1 6.6 2.0(L)  Hemoglobin 12.0 - 15.0 g/dL 13.1 13.9 9.0(L)  Hematocrit 36.0 - 46.0 % 41.5 44.8 28.8(L)  Platelets 150 - 400 K/uL 279 334  186     CMP Latest Ref Rng & Units 05/05/2019 02/03/2019 11/25/2018  Glucose 70 - 99 mg/dL 97 99 103(H)  BUN 6 - 20 mg/dL 14 24(H) 17  Creatinine 0.44 - 1.00 mg/dL 0.82 0.94 0.93  Sodium 135 - 145 mmol/L 142 144 142  Potassium 3.5 - 5.1 mmol/L 3.3(L) 4.3 4.2  Chloride 98 - 111 mmol/L 104 107 105  CO2 22 - 32 mmol/L 32 27 28  Calcium 8.9 - 10.3 mg/dL 9.4 9.3 9.1  Total Protein 6.5 - 8.1 g/dL 7.1 7.7 6.7  Total Bilirubin 0.3 - 1.2 mg/dL 0.5 <0.2(L) <0.2(L)  Alkaline Phos 38 - 126 U/L 99 106 81  AST 15 - 41 U/L 19 14(L) 10(L)  ALT 0 - 44 U/L _0 RADIOGRAPHIC STUDIES: I have personally reviewed the radiological images as listed and agreed with the findings in the report. No results found.   ASSESSMENT & PLAN:  Marissa [name] is a 54 y.o. female with  1.Small cell lung cancer, limited stage, new brain metastasis in 01/2019 -She was diagnosed in 07/2018. Imaging showed 66m nodule of left lung with malignant appearing left mediastinal and hilar lymphadenopathy. Her Bronchoscopy biopsy confirmed mass to be small cell lung cancer while her left hilar andmediastinal lymph node biopsy were negative for malignant cells.  -CT AP and MRI brain negative for distant metastasis. PET scan from 09/02/18 shows Hypermetabolic left mediastinal nodal metastasis, but no distant metastasis.  -Given her locally advanced disease (stage III cancer), she underwent standard treatment with concurrent chemoRT with 4 cycles of cisplatin/etopside. -Her CT CAP from 02/10/19 showedmarked reduction of tumor burden in the left hemithorax, no new metastasis in chest, abdomen, or pelvis. She responded well to treatment.  -Unfortunately her Brain MRI from 02/11/19 showed 6x10 mm solitary brain metastasis in right frontal lobe. -Her cancer is now stage IV and no longer curable but still treatable. Goal of care is palliative to control disease and prolong her life.  -She recently completed radiation to brain  02/28/19-03/14/19 with Dr. MLisbeth Renshaw   -Lately she has had increased SOB, fatigue and pain with breathing in her right chest and scapula. She also has RUQ tenderness and hepatomegaly on exam today. To further evaluate I will order CT scan to be done in 1-2 weeks.  -Labs reviewed, CBC and CMP WNL except K 3.3.  -F/u after scan.   2. Headaches  -she has h/o chronic headaches, controlled with BC powder but she has not needed any recently until 1 month ago she developed worseningleft sideheadaches with associated blurriness.  -MRI brain on 02/11/19 showed 10 mm solitary brain met in right frontal lobe. She is s/p whole brain RT start 02/28/19-03/14/19.  -She was seen by Dr. 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 She has been able to decrease her oxycodone, now on '5mg'$ .  -She notes her Headaches are less present with less screen time and usage of her glasses. She will continue to f/u with Dr. VMickeal Skinner 3. Smoking Cessation, COPD,H/oacute left tension pneumothorax in 07/2018 after bronchoscopy and EBUS biopsy (resolved) -She has a h/o heavy smoking. She has COPD. She is no longer on oxygen.  -She has reduced her smoking to 2-3 cigarettes a day. I highly advised her to quit completely. She is still trying.  -She has wheezes on exam today (05/05/19). She notes she has reduced smoking to 10 cigarettes a day.   4. Severe LUQ and left flank pain, right knee pain  -She had chest tube placed on left upper back. S/p removal she has had severe radiating LUQ pain.  -Ipreviouslyreferred her to Dr. HDonell Sievertoffice for pain management.Although her Medicaid has been approved, she cannot afford to see a specialist currently. -She has had this pain intermittently for quite some time.She fell 8+ months ago and re-injured her knee. Xray on 09/28/18 showed degenerative changes.  -Her left chest pain is still present but not as strong. She notes her pain is mainly now  related to her breathing in her right chest and right scapula.  -She currently takes oxycodone '5mg'$  1-2 times a day. I will refer her to our palliative care team to manage her pain. She is agreeable. I refilled her Oxycodone today (05/05/19) -She can continue to use Cymbalta TID and Gabapentin '300mg'$  in the AM, '600mg'$  in PM.   5. Iron deficient anemia  -Developed during recent hospitalization.  -Treated with 2 IV Iron doses in 07/2018. Anemia  has improved  6.Nausea, vomiting, poor appetite  -She notes still having a low appetite. She has not gained back the weight she lost since cancer diagnosis but her weight is trending up.  -She has been taking marijuana as needed to help her taste and help her eat.  -For any nausea she can take compazine, Zofran and Phenergan.   7. Anxiety/Depression, irritability, Social Support  -She has a h/o of anxiety and depression and was previously on antidepressants but did not tolerate well. -In 11/2018 she was very irritable and anxious when being around people and felt she could hurt someone. Her mood has much improved on Ativan as needed. She also takes Valium.  -She notes she uses Marijuana to help her eat, but this does not irritate her. She is on low dose oxycodone now. She denies any other use of recreational drugs.  -We also previously referred her to [place]. She will continue SW involvement. She has not seen [facility] provider yet.  -Currently on Medicaid, disability, food stamps. She gets $529 per month. Still does not have a car  -mood seems more positive lately, continue Ativan, I refilled today (05/05/19).   8. Goals of care -She knows her new brain metastasis makes her cancer stage IV and carries a poor prognosis.  -goal of her treatment is palliative  -she is full code now    PLAN: -I refilled her Ativan and Oxycodone today  -I will refer her to Palliative care clinic at [facility] -F/u in 1-2 weeks with lab and CT AP a few days  before    No problem-specific Assessment & Plan notes found for this encounter.   Orders Placed This Encounter  Procedures   CT Abdomen Pelvis W Contrast    Standing Status:   Future    Standing Expiration Date:   05/04/2020    Order Specific Question:   If indicated for the ordered procedure, I authorize the administration of contrast media per Radiology protocol    Answer:   Yes    Order Specific Question:   Is patient pregnant?    Answer:   No    Order Specific Question:   Preferred imaging location?    Answer:   [facility]    Order Specific Question:   Is Oral Contrast requested for this exam?    Answer:   Yes, Per Radiology protocol    Order Specific Question:   Radiology Contrast Protocol - do NOT remove file path    Answer:   [email or web address]   CT Chest W Contrast    Standing Status:   Future    Standing Expiration Date:   05/04/2020    Order Specific Question:   If indicated for the ordered procedure, I authorize the administration of contrast media per Radiology protocol    Answer:   Yes    Order Specific Question:   Is patient pregnant?    Answer:   No    Order Specific Question:   Preferred imaging location?    Answer:   [facility]    Order Specific Question:   Radiology Contrast Protocol - do NOT remove file path    Answer:   [email or web address]   Amb Referral to Palliative Care    Referral Priority:   Routine    Referral Type:   Consultation    Number of Visits Requested:   1   All questions were answered. The patient knows to call the clinic with any problems, questions  or concerns. No barriers to learning was detected. The total time spent in the appointment was 30 minutes.     [name], MD 05/05/2019   I, Joslyn Devon, am acting as scribe for [name], MD.   I have reviewed the above documentation for accuracy and completeness, and I agree with the above.

## 2019-05-05 ENCOUNTER — Encounter: Payer: Self-pay | Admitting: Hematology

## 2019-05-05 ENCOUNTER — Other Ambulatory Visit: Payer: Self-pay

## 2019-05-05 ENCOUNTER — Inpatient Hospital Stay: Payer: Medicaid Other | Attending: Hematology | Admitting: Hematology

## 2019-05-05 ENCOUNTER — Other Ambulatory Visit: Payer: Self-pay | Admitting: *Deleted

## 2019-05-05 ENCOUNTER — Inpatient Hospital Stay: Payer: Medicaid Other

## 2019-05-05 VITALS — BP 122/73 | HR 89 | Temp 98.9°F | Ht 62.0 in | Wt 161.1 lb

## 2019-05-05 DIAGNOSIS — J90 Pleural effusion, not elsewhere classified: Secondary | ICD-10-CM | POA: Diagnosis not present

## 2019-05-05 DIAGNOSIS — C3412 Malignant neoplasm of upper lobe, left bronchus or lung: Secondary | ICD-10-CM | POA: Diagnosis present

## 2019-05-05 DIAGNOSIS — C3490 Malignant neoplasm of unspecified part of unspecified bronchus or lung: Secondary | ICD-10-CM

## 2019-05-05 DIAGNOSIS — F1290 Cannabis use, unspecified, uncomplicated: Secondary | ICD-10-CM | POA: Diagnosis not present

## 2019-05-05 DIAGNOSIS — J449 Chronic obstructive pulmonary disease, unspecified: Secondary | ICD-10-CM | POA: Insufficient documentation

## 2019-05-05 DIAGNOSIS — Z923 Personal history of irradiation: Secondary | ICD-10-CM | POA: Insufficient documentation

## 2019-05-05 DIAGNOSIS — F329 Major depressive disorder, single episode, unspecified: Secondary | ICD-10-CM

## 2019-05-05 DIAGNOSIS — C3492 Malignant neoplasm of unspecified part of left bronchus or lung: Secondary | ICD-10-CM | POA: Diagnosis not present

## 2019-05-05 DIAGNOSIS — M25512 Pain in left shoulder: Secondary | ICD-10-CM | POA: Diagnosis not present

## 2019-05-05 DIAGNOSIS — C771 Secondary and unspecified malignant neoplasm of intrathoracic lymph nodes: Secondary | ICD-10-CM | POA: Insufficient documentation

## 2019-05-05 DIAGNOSIS — Z79899 Other long term (current) drug therapy: Secondary | ICD-10-CM | POA: Diagnosis not present

## 2019-05-05 DIAGNOSIS — F32A Depression, unspecified: Secondary | ICD-10-CM

## 2019-05-05 DIAGNOSIS — F419 Anxiety disorder, unspecified: Secondary | ICD-10-CM

## 2019-05-05 DIAGNOSIS — F17210 Nicotine dependence, cigarettes, uncomplicated: Secondary | ICD-10-CM | POA: Insufficient documentation

## 2019-05-05 DIAGNOSIS — C7931 Secondary malignant neoplasm of brain: Secondary | ICD-10-CM | POA: Insufficient documentation

## 2019-05-05 DIAGNOSIS — I2510 Atherosclerotic heart disease of native coronary artery without angina pectoris: Secondary | ICD-10-CM | POA: Insufficient documentation

## 2019-05-05 LAB — CBC WITH DIFFERENTIAL (CANCER CENTER ONLY)
Abs Immature Granulocytes: 0.02 10*3/uL (ref 0.00–0.07)
Basophils Absolute: 0.1 10*3/uL (ref 0.0–0.1)
Basophils Relative: 1 %
Eosinophils Absolute: 0.1 10*3/uL (ref 0.0–0.5)
Eosinophils Relative: 2 %
HCT: 41.5 % (ref 36.0–46.0)
Hemoglobin: 13.1 g/dL (ref 12.0–15.0)
Immature Granulocytes: 0 %
Lymphocytes Relative: 12 %
Lymphs Abs: 0.7 10*3/uL (ref 0.7–4.0)
MCH: 30.3 pg (ref 26.0–34.0)
MCHC: 31.6 g/dL (ref 30.0–36.0)
MCV: 95.8 fL (ref 80.0–100.0)
Monocytes Absolute: 0.5 10*3/uL (ref 0.1–1.0)
Monocytes Relative: 8 %
Neutro Abs: 4.7 10*3/uL (ref 1.7–7.7)
Neutrophils Relative %: 77 %
Platelet Count: 279 10*3/uL (ref 150–400)
RBC: 4.33 MIL/uL (ref 3.87–5.11)
RDW: 14.6 % (ref 11.5–15.5)
WBC Count: 6.1 10*3/uL (ref 4.0–10.5)
nRBC: 0 % (ref 0.0–0.2)

## 2019-05-05 LAB — CMP (CANCER CENTER ONLY)
ALT: 17 U/L (ref 0–44)
AST: 19 U/L (ref 15–41)
Albumin: 3.7 g/dL (ref 3.5–5.0)
Alkaline Phosphatase: 99 U/L (ref 38–126)
Anion gap: 6 (ref 5–15)
BUN: 14 mg/dL (ref 6–20)
CO2: 32 mmol/L (ref 22–32)
Calcium: 9.4 mg/dL (ref 8.9–10.3)
Chloride: 104 mmol/L (ref 98–111)
Creatinine: 0.82 mg/dL (ref 0.44–1.00)
GFR, Est AFR Am: >60 mL/min (ref >60)
GFR, Estimated: >60 mL/min (ref >60)
Glucose, Bld: 97 mg/dL (ref 70–99)
Potassium: 3.3 mmol/L — ABNORMAL LOW (ref 3.5–5.1)
Sodium: 142 mmol/L (ref 135–145)
Total Bilirubin: 0.5 mg/dL (ref 0.3–1.2)
Total Protein: 7.1 g/dL (ref 6.5–8.1)

## 2019-05-05 MED ORDER — LORAZEPAM 1 MG PO TABS: 1.0000 mg | ORAL_TABLET | Freq: Two times a day (BID) | ORAL | 0 refills | Status: DC | PRN

## 2019-05-05 MED ORDER — OXYCODONE HCL 5 MG PO TABS: 5.0000 mg | ORAL_TABLET | Freq: Two times a day (BID) | ORAL | 0 refills | Status: DC | PRN

## 2019-05-05 MED FILL — oxyCODONE HCL 5 MG TABS: 5 | 15 days supply | Qty: 30 | Fill #0

## 2019-05-05 MED FILL — LORazepam 1 MG TABS: 1 | 15 days supply | Qty: 30 | Fill #0

## 2019-05-12 ENCOUNTER — Telehealth: Payer: Self-pay | Admitting: Hematology

## 2019-05-12 NOTE — Telephone Encounter (Signed)
Moved appts pe md email.  Left a vm of the new appt date and time.

## 2019-05-13 ENCOUNTER — Ambulatory Visit
Admission: RE | Admit: 2019-05-13 | Discharge: 2019-05-13 | Disposition: A | Discharge status: Home / Self Care | Payer: Medicaid Other | Source: Ambulatory Visit | Attending: Radiation Oncology | Admitting: Radiation Oncology

## 2019-05-13 ENCOUNTER — Other Ambulatory Visit: Payer: Self-pay | Admitting: *Deleted

## 2019-05-13 DIAGNOSIS — Z510 Encounter for antineoplastic radiation therapy: Secondary | ICD-10-CM | POA: Insufficient documentation

## 2019-05-13 DIAGNOSIS — C3412 Malignant neoplasm of upper lobe, left bronchus or lung: Secondary | ICD-10-CM | POA: Insufficient documentation

## 2019-05-13 DIAGNOSIS — C7931 Secondary malignant neoplasm of brain: Secondary | ICD-10-CM

## 2019-05-13 MED ORDER — FENTANYL 25 MCG/HR TD PT72: 1.0000 | MEDICATED_PATCH | TRANSDERMAL | 0 refills | Status: AC

## 2019-05-13 MED ORDER — OXYCODONE HCL 10 MG PO TABS: 10.0000 mg | ORAL_TABLET | Freq: Three times a day (TID) | ORAL | 0 refills | Status: AC | PRN

## 2019-05-13 MED FILL — FENTANYL 25 MCG/HR PT72: 25 | 15 days supply | Qty: 5 | Fill #0

## 2019-05-13 MED FILL — oxyCODONE HCL 10 MG TABS: 10 | 15 days supply | Qty: 45 | Fill #0

## 2019-05-16 ENCOUNTER — Other Ambulatory Visit: Payer: Self-pay | Admitting: Radiation Therapy

## 2019-05-16 DIAGNOSIS — C7931 Secondary malignant neoplasm of brain: Secondary | ICD-10-CM

## 2019-05-16 DIAGNOSIS — C7949 Secondary malignant neoplasm of other parts of nervous system: Secondary | ICD-10-CM

## 2019-05-16 NOTE — Progress Notes (Signed)
[place]   [phone] [phone]) [phone]   Clinic Follow up Note   Patient Care Team: Gildardo Pounds, NP as PCP - General (Nurse Practitioner)  Date of Service:  05/19/2019  CHIEF COMPLAINT: F/u of Small Cell Lung Cancer  SUMMARY OF ONCOLOGIC HISTORY: Oncology History Overview Note  Cancer Staging Small cell lung cancer, left upper lobe ([place]) Staging form: Lung, AJCC 8th Edition - Clinical stage from 08/24/2018: Stage IIIA (cT1a, cN2, cM0) - Signed by [name], MD on 10/17/2018    Small cell lung cancer, left upper lobe ([place])  07/29/2018 Imaging   CT Angio Chest 07/29/18  IMPRESSION: 1. Malignant appearing left mediastinal and hilar lymphadenopathy narrowing the airways at the left hilum. In the left lung only a subtle 9 mm spiculated nodule is identified in the lingula. Consider small cell carcinoma. Bronchoscopy or mediastinal node sampling might be most valuable for diagnosis. 2. Indeterminate although low-density (which typically which indicates a benign adenoma) left adrenal nodule. 3.  Negative for acute pulmonary embolus.   08/24/2018 Definitive Surgery   Bronchoscopy and EBUS biopsy on 08/24/18 by Dr. Loanne Drilling   08/24/2018 Initial Biopsy   Diagnosis 08/24/18 FINE NEEDLE ASPIRATION, ENDOSCOPIC, (EBUS) STATION # 7 LYMPH NODE(SPECIMEN 1 OF 4 COLLECTED 08/24/18): NO MALIGNANT CELLS IDENTIFIED.  FINE NEEDLE ASPIRATION, ENDOSCOPIC, (EBUS) LEFT HILAR LYMPH NODE(SPECIMEN 2 OF 4 COLLECTED 08/24/18): NO MALIGNANT CELLS IDENTIFIED.  TRANSBRONCHIAL NEEDLE ASPIRATION, WANG, (SPECIMEN 3 OF 4 COLLECTED 08/24/18): NO MALIGNANT CELLS IDENTIFIED.  BRONCHIAL BRUSHING, LUL (SPECIMEN 4 OF 4 COLLECTED 08/24/18): MALIGNANT CELLS PRESENT, MOST CONSISTENT WITH SMALL CELL CARCINOMA. SEE COMMENT.   08/24/2018 Cancer Staging   Staging form: Lung, AJCC 8th Edition - Clinical stage from 08/24/2018: Stage IIIA (cT1a, cN2, cM0) - Signed by [name], MD on 10/17/2018     08/26/2018 Initial Diagnosis   Small cell lung cancer, left ([place])   08/26/2018 Imaging   CT AP W Contrast 08/26/18  IMPRESSION: 1. 2.9 cm left adrenal nodule cannot be definitively characterized on this study. Potentially a lipid poor adenoma, but metastatic disease not excluded. MRI of the abdomen with and without contrast may prove helpful to further evaluate. 2. No other findings to suggest metastatic disease in the abdomen/pelvis. 3. Left base atelectasis with small left pleural effusion.   08/27/2018 Imaging   MRI brain 08/27/18  IMPRESSION: 1. Motion degraded, incomplete examination. 2. Subcentimeter focus of diffusion signal abnormality in the left cerebellum. No enhancement is evident to strongly suggest a metastasis, and this may reflect an acute to subacute small vessel infarct. 3. No evidence of intracranial metastatic disease elsewhere. 4. Given motion artifact and incomplete postcontrast imaging, consider short-term follow-up MRI (possibly with sedation) to further evaluate the left cerebellar abnormality and provide a more thorough evaluation for metastatic disease.   09/02/2018 PET scan   IMPRESSION: 1. Hypermetabolic left hilar mass is identified encasing the left mainstem bronchus resulting in postobstructive atelectasis in the left upper lobe. 2. Hypermetabolic left mediastinal nodal metastasis. 3. Loculated left pleural effusion is identified with mild increased FDG uptake. Cannot rule out malignant pleural effusion. 4. No evidence for metastatic disease to the abdomen or pelvis or skeletal structures.     09/07/2018 - 11/10/2018 Chemotherapy   Concurrent chemoRT with IV cisplatin on day 1 and etopside day 1-3 for every 3 weeks starting 8/11. Will start radiation with Dr. Lisbeth Renshaw with cycle 2. She completed 4 cycles on 11/10/18   09/14/2018 Procedure   Thoracentesis yielding 750 mL  09/28/2018 - 12/09/2019 Radiation Therapy   Concurrent chemoRT with Dr. Lisbeth Renshaw.  Start with cycle 2 chemo on 09/28/18 and completed on 12/09/18.    02/10/2019 Imaging   CT CAP W Contrast  IMPRESSION: 1. Interval response to therapy. Significant interval reduction in tumor burden involving the left hemithorax. 2. No new or progressive disease identified within the chest, abdomen or pelvis. 3. Emphysema and aortic atherosclerosis. 4. Stable left adrenal nodule.   Aortic Atherosclerosis (ICD10-I70.0) and Emphysema (ICD10-J43.9).   02/11/2019 Imaging   MRI Brain  IMPRESSION: 6 x 10 mm solitary metastatic deposit right frontal lobe with minimal edema.   02/28/2019 - 03/14/2019 Radiation Therapy   Target Brain Radiation with Dr. Lisbeth Renshaw    05/19/2019 Imaging   CT CAP w contrast  IMPRESSION: 1. Trace residual soft tissue thickening at the left lung apex and along the AP window. Slight increase in size of a low left paratracheal lymph node. Continued attention on follow-up exams is warranted. No evidence of distant metastatic disease. 2. Subpleural density in the posterior left upper lobe is new and may be due to atelectasis. Recommend attention on follow-up. 3. Left adrenal adenoma. 4. Aortic atherosclerosis (ICD10-I70.0). Coronary artery calcification. 5.  Emphysema (ICD10-J43.9).      CURRENT THERAPY:  Observation  INTERVAL HISTORY:  Marissa [name] is here for a follow up. She presents to the clinic alone. She notes she was vomiting liquid which could get brown for the oast 4 days. She was not able to eat much during this. She notes today she feels much better. She note she has Zofran which she took for the first time today and resolved her nausea. She did not know she could take Zofran TID. She notes when she mildly smoked Marijuana she did not have nausea and was able to eat well. She notes she had not smoked in a while because she could not afford it. She notes she has seen Dr Nancy Nordmann and she plans to start her on low dose fentanyl 25mg/hr dose soon. She note she  is currently taking oxycodone 14mabout 1-3 tabs a day. She notes she is scared to take Fentanyl although she was open to try it. She notes she occasionally will need 5 tabs a day for bad pain days.     REVIEW OF SYSTEMS:   Constitutional: Denies fevers, chills or abnormal weight loss Eyes: Denies blurriness of vision Ears, nose, mouth, throat, and face: Denies mucositis or sore throat Respiratory: Denies cough, dyspnea or wheezes Cardiovascular: Denies palpitation, chest discomfort or lower extremity swelling Gastrointestinal:  Denies heartburn or change in bowel habits (+) Nausea (+) LUQ and left flank pain Skin: Denies abnormal skin rashes MSK. (+) Right knee pain, controlled  Lymphatics: Denies new lymphadenopathy or easy bruising Neurological:Denies numbness, tingling or new weaknesses Behavioral/Psych: Mood is stable, no new changes  All other systems were reviewed with the patient and are negative.  MEDICAL HISTORY:  Past Medical History:  Diagnosis Date   Asthma    Chronic headaches    COPD (chronic obstructive pulmonary disease) ([facility]   Depression    Heart murmur    Small cell lung cancer ([facility]    SURGICAL HISTORY: Past Surgical History:  Procedure Laterality Date   BRONCHIAL BIOPSY  08/24/2018   Procedure: BRONCHIAL BIOPSIES;  Surgeon: ElMargaretha SeedsMD;  [facility];  Service: Cardiopulmonary;;   BRONCHIAL BRUSHINGS  08/24/2018   Procedure: BRONCHIAL BRUSHINGS;  Surgeon: ElMargaretha SeedsMD;  [facility];  Service: Cardiopulmonary;;   BRONCHIAL NEEDLE ASPIRATION BIOPSY  08/24/2018   Procedure: BRONCHIAL NEEDLE ASPIRATION BIOPSIES;  Surgeon: Margaretha Seeds, MD;  [facility];  Service: Cardiopulmonary;;   ENDOBRONCHIAL ULTRASOUND N/A 08/24/2018   Procedure: ENDOBRONCHIAL ULTRASOUND;  Surgeon: Margaretha Seeds, MD;  Location: [facility];  Service: Cardiopulmonary;  Laterality: N/A;   IR THORACENTESIS ASP PLEURAL  SPACE W/IMG GUIDE  09/14/2018   TUBAL LIGATION     VIDEO BRONCHOSCOPY  08/24/2018   Procedure: VIDEO BRONCHOSCOPY;  Surgeon: Margaretha Seeds, MD;  Location: [facility];  Service: Cardiopulmonary;;    I have reviewed the social history and family history with the patient and they are unchanged from previous note.  ALLERGIES:  is allergic to penicillins.  MEDICATIONS:  Current Outpatient Medications  Medication Sig Dispense Refill   baclofen (LIORESAL) 10 MG tablet Take 1 tablet (10 mg total) by mouth 3 (three) times daily as needed for muscle spasms. 30 each 0   diazepam (VALIUM) 10 MG tablet Take 1 tablet po 30 minutes prior to MRI or radiation 10 tablet 0   dronabinol (MARINOL) 2.5 MG capsule Take 1 capsule (2.5 mg total) by mouth 2 (two) times daily before a meal. 30 capsule 0   DULoxetine (CYMBALTA) 20 MG capsule Take 2 capsules (40 mg total) by mouth daily. Take 1 capsule by mouth daily for the first week, then increase to 2 caps daily 60 capsule 1   fentaNYL (DURAGESIC) 25 MCG/HR Place 1 patch onto the skin every 3 (three) days for 14 days. 5 patch 0   gabapentin (NEURONTIN) 300 MG capsule Take 1 tab in morning and 2 tabs at bedtime 90 capsule 2   LORazepam (ATIVAN) 1 MG tablet Take 1 tablet (1 mg total) by mouth every 12 (twelve) hours as needed for anxiety. 30 tablet 0   Oxycodone HCl 10 MG TABS Take 1 tablet (10 mg total) by mouth 3 (three) times daily as needed for up to 15 days. 45 tablet 0   prochlorperazine (COMPAZINE) 10 MG tablet Take 1 tablet (10 mg total) by mouth every 6 (six) hours as needed (Nausea or vomiting). 30 tablet 3   promethazine (PHENERGAN) 25 MG tablet Take 1 tablet (25 mg total) by mouth every 6 (six) hours as needed for nausea or vomiting. 30 tablet 0   No current facility-administered medications for this visit.    PHYSICAL EXAMINATION: ECOG PERFORMANCE STATUS: 2 - Symptomatic, <50% confined to bed  Vitals:   05/19/19 1351  BP: 118/66    Pulse: 98  Resp: 17  Temp: 98.5 F (36.9 C)  SpO2: 98%   Filed Weights   05/19/19 1351  Weight: 160 lb 14.4 oz (73 kg)    Due to COVID19 we will limit examination to appearance. Patient had no complaints.  GENERAL:alert, no distress and comfortable SKIN: skin color normal, no rashes or significant lesions EYES: normal, Conjunctiva are pink and non-injected, sclera clear  NEURO: alert & oriented x 3 with fluent speech   LABORATORY DATA:  I have reviewed the data as listed CBC Latest Ref Rng & Units 05/05/2019 02/03/2019 11/25/2018  WBC 4.0 - 10.5 K/uL 6.1 6.6 2.0(L)  Hemoglobin 12.0 - 15.0 g/dL 13.1 13.9 9.0(L)  Hematocrit 36.0 - 46.0 % 41.5 44.8 28.8(L)  Platelets 150 - 400 K/uL 279 334 186     CMP Latest Ref Rng & Units 05/05/2019 02/03/2019 11/25/2018  Glucose 70 - 99 mg/dL 97 99 103(H)  BUN 6 -  20 mg/dL 14 24(H) 17  Creatinine 0.44 - 1.00 mg/dL 0.82 0.94 0.93  Sodium 135 - 145 mmol/L 142 144 142  Potassium 3.5 - 5.1 mmol/L 3.3(L) 4.3 4.2  Chloride 98 - 111 mmol/L 104 107 105  CO2 22 - 32 mmol/L 32 27 28  Calcium 8.9 - 10.3 mg/dL 9.4 9.3 9.1  Total Protein 6.5 - 8.1 g/dL 7.1 7.7 6.7  Total Bilirubin 0.3 - 1.2 mg/dL 0.5 <0.2(L) <0.2(L)  Alkaline Phos 38 - 126 U/L 99 106 81  AST 15 - 41 U/L 19 14(L) 10(L)  ALT 0 - 44 U/L _0 RADIOGRAPHIC STUDIES: I have personally reviewed the radiological images as listed and agreed with the findings in the report. No results found.   ASSESSMENT & PLAN:  Marissa [name] is a 54 y.o. female with    1.Small cell lung cancer, limited stage in 07/2018, brain metastasis in 01/2019 -She was diagnosed in 07/2018. Imaging showed 13m nodule of left lung with malignant appearing left mediastinal and hilar lymphadenopathy. Her Bronchoscopy biopsy confirmed mass to be small cell lung cancer while her left hilar andmediastinal lymph node biopsy were negative for malignant cells.  -CT AP and MRI brain negative for distant metastasis. PET scan  from 09/02/18 shows Hypermetabolic left mediastinal nodal metastasis, but no distant metastasis.  -Given her locally advanced disease (stage III cancer), she underwent standard treatment with concurrent chemoRT with 4 cycles of cisplatin/etopside. -Her CT CAP from 02/10/19 showedmarked reduction of tumor burden in the left hemithorax, no new metastasis in chest, abdomen, or pelvis. She responded well to treatment.  -Unfortunately her Brain MRI from 02/11/19 showed 6x10 mm solitary brain metastasis in right frontal lobe. She completed radiation to brain 02/28/19-03/14/19 with Dr. MLisbeth Renshaw   -I personally reviewed and discussed her CT CAP from 05/17/19 which shows trace residual soft tissue of left lung apex with slight increase in size of paratracheal LNs. No other new metastatic lesion. She continues to have good response to prior chemoRT.   -Labs reviewed from 05/05/19, WNL  -Will continue with observation with repeat CT scan in 3 months. Next MRI brain on 06/09/19.  -F/u in 6 weeks    2. Headaches  -Her Chronic HA worsened with blurry vision in early 2021.  -MRI brain on 02/11/19 showed 10 mm solitary brain met in right frontal lobe. She is s/p whole brain RT start 02/28/19-03/14/19.  -She was seen by Dr. 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 -She notes her Headaches are less present with less screen time and usage of her glasses. She will continue to f/u with Dr. VMickeal Skinner  3. Smoking Cessation, COPD,H/oacute left tension pneumothorax in 07/2018 after bronchoscopy and EBUS biopsy (resolved) -She has a h/o heavy smoking. She has COPD. She is no longer on oxygen.  -She has reduced her smoking to 2-3 cigarettes a day. I highly advised her to quit completely. She is still trying.  -She previously noted she has reduced smoking to 10 cigarettes a day.   4. Severe LUQ and left flank pain, right knee pain  -She had chest tube placed on left upper  back. S/p removal she has had severe radiating LUQ pain.  -Ipreviouslyreferred her to Dr. HDonell Sievertoffice for pain management.Although her Medicaid has been approved, she cannot afford to see a specialist currently. -She is now being seen by Dr GNancy Nordmannfor pain management.  -Her left chest pain  is still present but not as strong and re-injured right knee pain. She notes her pain is mainly now related to her breathing in her right chest and right scapula.  -She currently takes oxycodone 41m 1-3 times a day but for her severe pain days she will take up to 5 tabs in a day. Dr. GNancy Nordmannprescribed her Fentanyl 231m/hr for 75 hours on 05/13/19 but she has not taken due to concerns of the effects.  -I suggest she not take Long acting Fentanyl unless she has more consistent severe pain. She is able to mainly control her pain on Oxycodone at this time. I will review with Dr. GoNancy Nordmann -She also takes Cymbalta TID and Gabapentin 30035mn the AM, 600m54m PM.   5.Nausea, vomiting, poor appetite  -She notes still having a low appetite. She has not gained back the weight she lost since cancer diagnosis but her weight is trending up.  -She had another bout of significant N&V liquid for 4 days with little food intake. I again reviewed antiemetic use. Zofran works best for her so I recommend she use TID. Before each meal. She also has Compazine and phenergan.  -She ran out of marijuana which she was taking as needed to help her nausea and eating. I will call in Marinol to help with this (05/19/19). She is agreeable. I advised her not to take Marinol at the same time as Marijuana.   6. Iron deficient anemia  -Developed during 07/2018 hospitalization. She was treated with 2 IV Iron doses in 07/2018.  -Anemia has resolved recently since off treatment.  7. Anxiety/Depression, irritability, Social Support  -She has a h/o of anxiety and depression and was previously on antidepressants but did not tolerate  well. -In 11/2018 she was very irritable and anxious when being around people and felt she could hurt someone. Her mood has much improved on Ativan as needed. She also takes Valium.  -She notes she uses Marijuana to help her eat, but this does not irritate her. She is on low dose oxycodone now. She denies any other use of recreational drugs.  -We also previously referred her to [facility] will continue SW involvement. She has not seen [facility] yet.  -Currently on Medicaid, disability, food stamps. She gets $529 per month. Still does not have a car  -mood seems more positive lately, continue Ativan.  8. Goals of care -She knows her new brain metastasis makes her cancer stage IV and carries a poor prognosis.  -goal of her treatment is palliative  -she is full code now    PLAN: -CT CAP reviewed, good response to treatment and no new lesions  -MRI Brain on 06/09/19 -Lab and F/u in 6 weeks, will order restaging scan on next visit  -Copy note to Dr. GoulNancy Nordmannut her pain meds  -[place]    No problem-specific Assessment & Plan notes found for this encounter.   No orders of the defined types were placed in this encounter.  All questions were answered. The patient knows to call the clinic with any problems, questions or concerns. No barriers to learning was detected. The total time spent in the appointment was 30 minutes.     Anoop Hemmer [name] 05/19/2019   I, AmoyJoslyn Devon acting as scribe for Jessilynn Taft [name].   I have reviewed the above documentation for accuracy and completeness, and I agree with the above.

## 2019-05-16 NOTE — Consult Note (Signed)
[facility] Palliative Care Telemedicine Visit Referred by: Dr. Burr Medico  54 yo with small cell lung cancer diagnosed in 07/2018. Brain lesion dx 01/2019. Overall poor prognosis. Significant issues with pain, mostly under her left breast, worse with breathing deeply possibly related to prior chest tube placement and knee pain related to prior injury. She struggles with anxiety and depression.   We discussed her use of opioids and pain management in general. She has significant anxiety about not being able to obtain pain medication. She is more functional on higher doses. She has no history of opioid abuse, but has limited support at home.   She reports she is taking more than prescribed but is afraid to tell her providers this because of fear they will think she is abusing or misusing her medication-she feels stigma related to this.     1. Advanced Care Planning  We discussed ACP- she does not have documents in place but she tells me her son would be her surrogate decision maker if she cannot speak for her self. She tells me she has talked to him about her wishes when she dies, I asked her if she had spoken to him about her wishes while she was still living and how she would want to be card for if her condition gets worse. We discussed the concept of DNR orders- she tells me she is already suffering and could not take much more -she would not want CPR or any invasive medical interventions. She would like to avoid hospitalization if possible.She agrees to me placing a DNR order in her chart. I offered to help get her set up with [facility] to get documents completed on one of her next visits-she is agreeable.  2. Pain and Symptom Management  1. Patient is using around 50mg  of oxycodone a day- she has no baseline pain control. Will start her on a fentanyl patch at EQA dose of 25 mcg, with oxycodone 10mg  TID for breakthrough pain. I provided counseling on the use and action of a transdermal opioid. Ok to also  continue gabapentin.   I gave her two week supply of her medications- if going well med-onc can continue- if needs dosing adjustment I can schedule another follow-up visit.  2. She will continue her current anxiety regimen started by Psychiatry.   3. She reports medicinal use of THC for nausea and appetite stimulation.  Patient may benefit from outpatient palliative care servcies given her prognosis so that she can be transitioned earlier into hospice care to help meet her goals of avoiding hospitalization or any additional suffering.  Marissa Hacker, DO Palliative Medicine   Time: 40 minutes Greater than 50%  of this time was spent counseling and coordinating care related to the above assessment and plan.

## 2019-05-17 ENCOUNTER — Other Ambulatory Visit: Payer: Self-pay

## 2019-05-17 ENCOUNTER — Encounter (HOSPITAL_COMMUNITY): Payer: Self-pay

## 2019-05-17 ENCOUNTER — Ambulatory Visit (HOSPITAL_COMMUNITY)
Admission: RE | Admit: 2019-05-17 | Discharge: 2019-05-17 | Disposition: A | Discharge status: Home / Self Care | Payer: Medicaid Other | Source: Ambulatory Visit | Attending: Hematology | Admitting: Hematology

## 2019-05-17 DIAGNOSIS — C3412 Malignant neoplasm of upper lobe, left bronchus or lung: Secondary | ICD-10-CM | POA: Insufficient documentation

## 2019-05-17 MED ORDER — SODIUM CHLORIDE (PF) 0.9 % IJ SOLN
INTRAMUSCULAR | Status: AC
  Filled 2019-05-17: qty 50

## 2019-05-17 MED ORDER — IOHEXOL 300 MG/ML  SOLN
100.0000 mL | Freq: Once | INTRAMUSCULAR | Status: AC | PRN
  Administered 2019-05-17: 100 mL via INTRAVENOUS

## 2019-05-19 ENCOUNTER — Inpatient Hospital Stay (HOSPITAL_BASED_OUTPATIENT_CLINIC_OR_DEPARTMENT_OTHER): Payer: Medicaid Other | Admitting: Hematology

## 2019-05-19 ENCOUNTER — Encounter: Payer: Self-pay | Admitting: Hematology

## 2019-05-19 ENCOUNTER — Other Ambulatory Visit: Payer: Self-pay

## 2019-05-19 VITALS — BP 118/66 | HR 98 | Temp 98.5°F | Resp 17 | Ht 62.0 in | Wt 160.9 lb

## 2019-05-19 DIAGNOSIS — C3412 Malignant neoplasm of upper lobe, left bronchus or lung: Secondary | ICD-10-CM | POA: Diagnosis not present

## 2019-05-19 MED ORDER — DRONABINOL 2.5 MG PO CAPS: 2.5000 mg | ORAL_CAPSULE | Freq: Two times a day (BID) | ORAL | 0 refills | Status: DC

## 2019-05-19 MED FILL — DRONABINOL 2.5 MG CAPSULE: 2.5 | 15 days supply | Qty: 30 | Fill #0

## 2019-05-20 ENCOUNTER — Telehealth: Payer: Self-pay | Admitting: Hematology

## 2019-05-20 ENCOUNTER — Ambulatory Visit: Payer: Medicaid Other | Admitting: Hematology

## 2019-05-20 NOTE — Telephone Encounter (Signed)
Scheduled appts per 4/22 los. Pt confirmed appt date and time.

## 2019-05-24 ENCOUNTER — Ambulatory Visit: Payer: Medicaid Other | Admitting: Internal Medicine

## 2019-06-06 ENCOUNTER — Telehealth: Payer: Self-pay

## 2019-06-06 NOTE — Telephone Encounter (Signed)
Ms Hosie left vm requesting name of her pain management physician and refill on nausea medicine.

## 2019-06-07 ENCOUNTER — Telehealth: Payer: Self-pay

## 2019-06-07 NOTE — Telephone Encounter (Signed)
Returning Pt's call in reference to call she received yesterday. No answer v/m left to return call.

## 2019-06-09 ENCOUNTER — Encounter (HOSPITAL_COMMUNITY): Payer: Self-pay

## 2019-06-09 ENCOUNTER — Ambulatory Visit (HOSPITAL_COMMUNITY): Admission: RE | Admit: 2019-06-09 | Payer: Medicaid Other | Source: Ambulatory Visit

## 2019-06-10 ENCOUNTER — Other Ambulatory Visit: Payer: Self-pay | Admitting: Hematology

## 2019-06-10 ENCOUNTER — Other Ambulatory Visit: Payer: Self-pay

## 2019-06-10 DIAGNOSIS — C3412 Malignant neoplasm of upper lobe, left bronchus or lung: Secondary | ICD-10-CM

## 2019-06-10 DIAGNOSIS — R110 Nausea: Secondary | ICD-10-CM

## 2019-06-10 DIAGNOSIS — C7931 Secondary malignant neoplasm of brain: Secondary | ICD-10-CM

## 2019-06-10 MED ORDER — OXYCODONE HCL 10 MG PO TABS: 10.0000 mg | ORAL_TABLET | Freq: Three times a day (TID) | ORAL | 0 refills | Status: DC | PRN

## 2019-06-10 MED ORDER — PROMETHAZINE HCL 25 MG PO TABS: 25.0000 mg | ORAL_TABLET | Freq: Four times a day (QID) | ORAL | 1 refills | Status: DC | PRN

## 2019-06-10 NOTE — Telephone Encounter (Signed)
Ms Mullan called requesting refill for oxycodone and phenergan.  I filled phenergan.

## 2019-06-13 ENCOUNTER — Inpatient Hospital Stay: Payer: Medicaid Other | Attending: Hematology | Admitting: Internal Medicine

## 2019-06-13 ENCOUNTER — Inpatient Hospital Stay: Payer: Medicaid Other

## 2019-06-23 ENCOUNTER — Other Ambulatory Visit: Payer: Self-pay | Admitting: Radiation Therapy

## 2019-06-23 DIAGNOSIS — C7931 Secondary malignant neoplasm of brain: Secondary | ICD-10-CM

## 2019-06-27 ENCOUNTER — Other Ambulatory Visit: Payer: Self-pay | Admitting: Radiation Oncology

## 2019-06-27 MED ORDER — DIAZEPAM 10 MG PO TABS: ORAL_TABLET | ORAL | 0 refills | Status: DC

## 2019-06-28 MED FILL — DIAZEPAM 10 MG TABS: 10 | 5 days supply | Qty: 5 | Fill #0

## 2019-06-29 ENCOUNTER — Telehealth: Payer: Self-pay | Admitting: Hematology

## 2019-06-29 ENCOUNTER — Telehealth: Payer: Self-pay

## 2019-06-29 NOTE — Telephone Encounter (Signed)
R./s appt per 6/2 sch message- unable to reach pt . Left message with appt date and time

## 2019-06-29 NOTE — Telephone Encounter (Signed)
Marissa [name] called requesting to reschedule appointments as she is out of town.  Scheduling message sent.

## 2019-06-30 ENCOUNTER — Inpatient Hospital Stay: Payer: Medicaid Other

## 2019-06-30 ENCOUNTER — Inpatient Hospital Stay: Payer: Medicaid Other | Admitting: Hematology

## 2019-07-04 NOTE — Progress Notes (Signed)
[place]   [phone] [phone]   Clinic Follow up Note   Patient Care Team: Gildardo Pounds, NP as PCP - General (Nurse Practitioner)  Date of Service:  07/06/2019  CHIEF COMPLAINT: F/u of Small Cell Lung Cancer  SUMMARY OF ONCOLOGIC HISTORY: Oncology History Overview Note  Cancer Staging Small cell lung cancer, left upper lobe ([place]) Staging form: Lung, AJCC 8th Edition - Clinical stage from 08/24/2018: Stage IIIA (cT1a, cN2, cM0) - Signed by [name], MD on 10/17/2018    Small cell lung cancer, left upper lobe ([place])  07/29/2018 Imaging   CT Angio Chest 07/29/18  IMPRESSION: 1. Malignant appearing left mediastinal and hilar lymphadenopathy narrowing the airways at the left hilum. In the left lung only a subtle 9 mm spiculated nodule is identified in the lingula. Consider small cell carcinoma. Bronchoscopy or mediastinal node sampling might be most valuable for diagnosis. 2. Indeterminate although low-density (which typically which indicates a benign adenoma) left adrenal nodule. 3.  Negative for acute pulmonary embolus.   08/24/2018 Definitive Surgery   Bronchoscopy and EBUS biopsy on 08/24/18 by Dr. Loanne Drilling   08/24/2018 Initial Biopsy   Diagnosis 08/24/18 FINE NEEDLE ASPIRATION, ENDOSCOPIC, (EBUS) STATION # 7 LYMPH NODE(SPECIMEN 1 OF 4 COLLECTED 08/24/18): NO MALIGNANT CELLS IDENTIFIED.  FINE NEEDLE ASPIRATION, ENDOSCOPIC, (EBUS) LEFT HILAR LYMPH NODE(SPECIMEN 2 OF 4 COLLECTED 08/24/18): NO MALIGNANT CELLS IDENTIFIED.  TRANSBRONCHIAL NEEDLE ASPIRATION, WANG, (SPECIMEN 3 OF 4 COLLECTED 08/24/18): NO MALIGNANT CELLS IDENTIFIED.  BRONCHIAL BRUSHING, LUL (SPECIMEN 4 OF 4 COLLECTED 08/24/18): MALIGNANT CELLS PRESENT, MOST CONSISTENT WITH SMALL CELL CARCINOMA. SEE COMMENT.   08/24/2018 Cancer Staging   Staging form: Lung, AJCC 8th Edition - Clinical stage from 08/24/2018: Stage IIIA (cT1a, cN2, cM0) - Signed by [name], MD on 10/17/2018     08/26/2018 Initial Diagnosis   Small cell lung cancer, left ([place])   08/26/2018 Imaging   CT AP W Contrast 08/26/18  IMPRESSION: 1. 2.9 cm left adrenal nodule cannot be definitively characterized on this study. Potentially a lipid poor adenoma, but metastatic disease not excluded. MRI of the abdomen with and without contrast may prove helpful to further evaluate. 2. No other findings to suggest metastatic disease in the abdomen/pelvis. 3. Left base atelectasis with small left pleural effusion.   08/27/2018 Imaging   MRI brain 08/27/18  IMPRESSION: 1. Motion degraded, incomplete examination. 2. Subcentimeter focus of diffusion signal abnormality in the left cerebellum. No enhancement is evident to strongly suggest a metastasis, and this may reflect an acute to subacute small vessel infarct. 3. No evidence of intracranial metastatic disease elsewhere. 4. Given motion artifact and incomplete postcontrast imaging, consider short-term follow-up MRI (possibly with sedation) to further evaluate the left cerebellar abnormality and provide a more thorough evaluation for metastatic disease.   09/02/2018 PET scan   IMPRESSION: 1. Hypermetabolic left hilar mass is identified encasing the left mainstem bronchus resulting in postobstructive atelectasis in the left upper lobe. 2. Hypermetabolic left mediastinal nodal metastasis. 3. Loculated left pleural effusion is identified with mild increased FDG uptake. Cannot rule out malignant pleural effusion. 4. No evidence for metastatic disease to the abdomen or pelvis or skeletal structures.     09/07/2018 - 11/10/2018 Chemotherapy   Concurrent chemoRT with IV cisplatin on day 1 and etopside day 1-3 for every 3 weeks starting 8/11. Will start radiation with Dr. Lisbeth Renshaw with cycle 2. She completed 4 cycles on 11/10/18   09/14/2018 Procedure   Thoracentesis yielding 750 mL  09/28/2018 - 12/09/2019 Radiation Therapy   Concurrent chemoRT with Dr. Lisbeth Renshaw.  Start with cycle 2 chemo on 09/28/18 and completed on 12/09/18.    02/10/2019 Imaging   CT CAP W Contrast  IMPRESSION: 1. Interval response to therapy. Significant interval reduction in tumor burden involving the left hemithorax. 2. No new or progressive disease identified within the chest, abdomen or pelvis. 3. Emphysema and aortic atherosclerosis. 4. Stable left adrenal nodule.   Aortic Atherosclerosis (ICD10-I70.0) and Emphysema (ICD10-J43.9).   02/11/2019 Imaging   MRI Brain  IMPRESSION: 6 x 10 mm solitary metastatic deposit right frontal lobe with minimal edema.   02/28/2019 - 03/14/2019 Radiation Therapy   Target Brain Radiation with Dr. Lisbeth Renshaw    05/19/2019 Imaging   CT CAP w contrast  IMPRESSION: 1. Trace residual soft tissue thickening at the left lung apex and along the AP window. Slight increase in size of a low left paratracheal lymph node. Continued attention on follow-up exams is warranted. No evidence of distant metastatic disease. 2. Subpleural density in the posterior left upper lobe is new and may be due to atelectasis. Recommend attention on follow-up. 3. Left adrenal adenoma. 4. Aortic atherosclerosis (ICD10-I70.0). Coronary artery calcification. 5.  Emphysema (ICD10-J43.9).      CURRENT THERAPY:  Observation  INTERVAL HISTORY:  Marissa [name] is here for a follow up. She presents to the clinic alone. She notes she has been having recurrent styes of her eyes. She takes oxycodone twice during the day and once at night (TID). Since treatment she is mostly tired with low energy, but able to have good days. She notes her main pain is in her left shoulder and left mid ribcage, but recent mid to lower back pain and occasional RUQ pain. She notes she has been forgetting things more often since RT. She notes she does have right knee pain. She notes she has reduced smoking to 11 cigarettes a day.     REVIEW OF SYSTEMS:   Constitutional: Denies fevers, chills or  abnormal weight loss Eyes: Denies blurriness of vision Ears, nose, mouth, throat, and face: Denies mucositis or sore throat (+) recurrent styes of eyes Respiratory: Denies cough, dyspnea or wheezes Cardiovascular: Denies palpitation, chest discomfort or lower extremity swelling Gastrointestinal:  Denies nausea, heartburn or change in bowel habits (+) occasional RUQ pain Skin: Denies abnormal skin rashes MSK: (+) left shoulder and left mid ribcage (+) mid to lower back pain (+) Right knee pain  Lymphatics: Denies new lymphadenopathy or easy bruising Neurological:Denies numbness, tingling or new weaknesses Behavioral/Psych: Mood is stable, no new changes  All other systems were reviewed with the patient and are negative.  MEDICAL HISTORY:  Past Medical History:  Diagnosis Date  . Asthma   . Chronic headaches   . COPD (chronic obstructive pulmonary disease) ([place])   . Depression   . Heart murmur   . Small cell lung cancer ([place])     SURGICAL HISTORY: Past Surgical History:  Procedure Laterality Date  . BRONCHIAL BIOPSY  08/24/2018   Procedure: BRONCHIAL BIOPSIES;  Surgeon: Margaretha Seeds, MD;  Location: [facility];  Service: Cardiopulmonary;;  . BRONCHIAL BRUSHINGS  08/24/2018   Procedure: BRONCHIAL BRUSHINGS;  Surgeon: Margaretha Seeds, MD;  Location: [facility];  Service: Cardiopulmonary;;  . BRONCHIAL NEEDLE ASPIRATION BIOPSY  08/24/2018   Procedure: BRONCHIAL NEEDLE ASPIRATION BIOPSIES;  Surgeon: Margaretha Seeds, MD;  Location: [facility];  Service: Cardiopulmonary;;  . ENDOBRONCHIAL ULTRASOUND N/A 08/24/2018   Procedure: ENDOBRONCHIAL ULTRASOUND;  Surgeon:  Ellison, Chi Jane, MD;  Location: [facility];  Service: Cardiopulmonary;  Laterality: N/A;  . IR THORACENTESIS ASP PLEURAL SPACE W/IMG GUIDE  09/14/2018  . TUBAL LIGATION    . VIDEO BRONCHOSCOPY  08/24/2018   Procedure: VIDEO BRONCHOSCOPY;  Surgeon: Ellison, Chi Jane, MD;  Location: [facility];  Service:  Cardiopulmonary;;    I have reviewed the social history and family history with the patient and they are unchanged from previous note.  ALLERGIES:  is allergic to penicillins.  MEDICATIONS:  Current Outpatient Medications  Medication Sig Dispense Refill  . baclofen (LIORESAL) 10 MG tablet Take 1 tablet (10 mg total) by mouth 3 (three) times daily as needed for muscle spasms. 30 each 0  . diazepam (VALIUM) 10 MG tablet Take 1 tablet po 30 minutes prior to MRI or radiation 5 tablet 0  . dronabinol (MARINOL) 2.5 MG capsule Take 1 capsule (2.5 mg total) by mouth 2 (two) times daily before a meal. 30 capsule 0  . DULoxetine (CYMBALTA) 20 MG capsule Take 2 capsules (40 mg total) by mouth daily. Take 1 capsule by mouth daily for the first week, then increase to 2 caps daily 60 capsule 1  . gabapentin (NEURONTIN) 300 MG capsule Take 1 tab in morning and 2 tabs at bedtime 90 capsule 2  . LORazepam (ATIVAN) 1 MG tablet Take 1 tablet (1 mg total) by mouth every 12 (twelve) hours as needed for anxiety. 30 tablet 0  . Oxycodone HCl 10 MG TABS Take 1 tablet (10 mg total) by mouth every 8 (eight) hours as needed. 60 tablet 0  . prochlorperazine (COMPAZINE) 10 MG tablet Take 1 tablet (10 mg total) by mouth every 6 (six) hours as needed (Nausea or vomiting). 30 tablet 3  . promethazine (PHENERGAN) 25 MG tablet Take 1 tablet (25 mg total) by mouth every 6 (six) hours as needed for nausea or vomiting. 30 tablet 1   No current facility-administered medications for this visit.    PHYSICAL EXAMINATION: ECOG PERFORMANCE STATUS: 2 - Symptomatic, <50% confined to bed  Vitals:   07/06/19 0958  BP: 133/65  Pulse: 84  Resp: 18  Temp: 97.9 F (36.6 C)  SpO2: 96%   Filed Weights   07/06/19 0958  Weight: 149 lb 14.4 oz (68 kg)    GENERAL:alert, no distress and comfortable SKIN: skin color, texture, turgor are normal, no rashes or significant lesions EYES: normal, Conjunctiva are pink and non-injected,  sclera clear  NECK: supple, thyroid normal size, non-tender, without nodularity LYMPH:  no palpable lymphadenopathy in the cervical, axillary  LUNGS: percussion with normal breathing effort (+) Mild wheezing and Rhonchi HEART: regular rate & rhythm and no murmurs and no lower extremity edema  ABDOMEN:abdomen soft, non-tender and normal bowel sounds (+) Mild hepatomegaly  Musculoskeletal:no cyanosis of digits and no clubbing (+) Mid spine tenderness  NEURO: alert & oriented x 3 with fluent speech, no focal motor/sensory deficits  LABORATORY DATA:  I have reviewed the data as listed CBC Latest Ref Rng & Units 07/06/2019 05/05/2019 02/03/2019  WBC 4.0 - 10.5 K/uL 7.8 6.1 6.6  Hemoglobin 12.0 - 15.0 g/dL 14.2 13.1 13.9  Hematocrit 36.0 - 46.0 % 44.2 41.5 44.8  Platelets 150 - 400 K/uL 275 279 334     CMP Latest Ref Rng & Units 07/06/2019 05/05/2019 02/03/2019  Glucose 70 - 99 mg/dL 86 97 99  BUN 6 - 20 mg/dL 24(H) 14 24(H)  Creatinine 0.44 - 1.00 mg/dL 1.00 0.82 0.94    Sodium 135 - 145 mmol/L 144 142 144  Potassium 3.5 - 5.1 mmol/L 3.9 3.3(L) 4.3  Chloride 98 - 111 mmol/L 105 104 107  CO2 22 - 32 mmol/L 28 32 27  Calcium 8.9 - 10.3 mg/dL 10.3 9.4 9.3  Total Protein 6.5 - 8.1 g/dL 7.6 7.1 7.7  Total Bilirubin 0.3 - 1.2 mg/dL <0.2(L) 0.5 <0.2(L)  Alkaline Phos 38 - 126 U/L 81 99 106  AST 15 - 41 U/L 31 19 14(L)  ALT 0 - 44 U/L 45(H) 17 10      RADIOGRAPHIC STUDIES: I have personally reviewed the radiological images as listed and agreed with the findings in the report. No results found.   ASSESSMENT & PLAN:  Marissa [name] is a 54 y.o. female with    1.Small cell lung cancer, limited stage in 07/2018, brain metastasis in 01/2019 -She was diagnosed in 07/2018. Imaging showed 9mm nodule of left lung with malignant appearing left mediastinal and hilar lymphadenopathy. Her Bronchoscopy biopsy confirmed mass to be small cell lung cancer while her left hilar andmediastinal lymph node biopsy were  negative for malignant cells.  -CT AP and MRI brain negative for distant metastasis. PET scan from 09/02/18 shows Hypermetabolic left mediastinal nodal metastasis, but no distant metastasis.  -Given her locally advanced disease (stage III cancer), she underwent standard treatment with concurrent chemoRT with 4 cycles of cisplatin/etopside. -Her CT CAP from 02/10/19 showedmarked reduction of tumor burden in the left hemithorax, no new metastasis in chest, abdomen, or pelvis. She responded well to treatment.  -Unfortunately her Brain MRI from 02/11/19 showed 6x10 mm solitary brain metastasis in right frontal lobe. She completed radiation to brain 02/28/19-03/14/19 with Dr. Moody. -Her CT CAP from 05/17/19 showed trace residual soft tissue of left lung apex with slight increase in size of paratracheal LNs. No other new metastatic lesion. She continues to have good response to prior chemoRT.   -She is clinically stable, her pain is controlled and notes fatigue mostly but manageable. Physical exam shows Mild hepatomegaly and mid spine tenderness. Labs reviewed, CBC and CMP WNL except BUN 24.  -Will continue with observation with repeat CT scan before next visit. Next MRI brain on 07/13/19.  -F/u in 6 weeks    2. Headaches  -Her Chronic HA worsened with blurry vision in early 2021.  -MRI brain on 02/11/19 showed 10 mm solitary brain met in right frontal lobe. She is s/p whole brain RT start 02/28/19-03/14/19.  -She was seen by Dr. 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 -She notes her Headaches are less present with less screen time and usage of her glasses. She will continue to f/u with Dr. Vaslow. -She notes forgetting things since her RT. I recommend she write things down to help her remember.  -Her next brain MRI on 07/13/19.   3. Smoking Cessation, COPD,H/oacute left tension pneumothorax in 07/2018 after bronchoscopy and EBUS biopsy  (resolved) -She has a h/o heavy smoking. She has COPD. She is no longer on oxygen.  -She has reduced her smoking to 2-3 cigarettes a day. I highly advised her to quit completely. She is still trying. -She previously noted she has reduced smoking to 10-11 cigarettes a day.She has wheezing and Rhonchi on exam today (07/06/19)  4. Severe LUQ and left flank pain, right knee pain  -She had chest tube placed on left upper back. S/p removal she has had severe radiating LUQ pain.  -Ipreviouslyreferred her to   Dr. Harkin's office for pain management.Although her Medicaid has been approved, she cannot afford to see a specialist currently. -She is now being seen by Dr Goulding for pain management.  -Her left chest pain is still present but not as strong and re-injured right knee pain. She notes her pain is mainly now related to her breathing in her right chest and right scapula.  -She currently takes oxycodone 10mg TID times a day but for her severe pain days she will take up to 5 tabs in a day. Dr. Goulding prescribed her Fentanyl 25mcg/hr for 75 hours on 05/13/19 but did not take it due to concerns of the effects.  -She also takes Cymbalta TID and Gabapentin 300mg in the AM, 600mg in PM.  -Her main pain is in her left shoulder and left ribcage, but notes recent mid to lower back pain and occasional RUQ pain. Pain is controlled on Oxycodone TID currently.   5.Nausea, vomiting, poor appetite  -She notes still having a low appetite. She has not gained back the weight she lost since cancer diagnosisbut her weight is trending up. -She had another bout of significant N&V liquid for 4 days with little food intake. I again reviewed antiemetic use. Zofran works best for her so I recommend she use TID. Before each meal. She also has Compazine and phenergan.  -She ran out of marijuana which she was taking as needed to help her nausea and eating. I started her on Marinol to help with this (05/19/19). I advised her  not to take Marinol at the same time as Marijuana.   6. Iron deficient anemia  -Developed during 07/2018 hospitalization. She was treated with 2 IV Iron doses in 07/2018.  -Anemia has resolved recently since off treatment.   7. Anxiety/Depression, irritability, Social Support  -She has a h/o of anxiety and depression and was previously on antidepressants but did not tolerate well. -In 11/2018 she was very irritable and anxious when being around people and felt she could hurt someone. Her mood has much improved on Ativan as needed. She also takes Valium.  -She notes she uses Marijuana to help her eat, but this does not irritate her. She is onlow dose oxycodonenow. She denies any other use of recreational drugs.  -We also previously referred her to [facility]. She will continue SW involvement. She has not seen MH provider yet.  -Currently on Medicaid, disability, food stamps. She gets $529 per month. Still does not have a car  -mood seems more positive lately, continue Ativan.  8. Goals of care -She knows her new brain metastasis makes her cancer stage IV and carries a poor prognosis.  -goal of her treatment is palliative  -she is full code now    PLAN: -I refilled her Phenergan and Oxycodone  -F/u in 6 weeks with lab and CT CAP w contrast a few days before  -Brain MRI on 07/13/19.  She will follow up with Dr. Vaslow after the scan   No problem-specific Assessment & Plan notes found for this encounter.   Orders Placed This Encounter  Procedures  . CT Abdomen Pelvis W Contrast    Standing Status:   Future    Standing Expiration Date:   07/05/2020    Order Specific Question:   If indicated for the ordered procedure, I authorize the administration of contrast media per Radiology protocol    Answer:   Yes    Order Specific Question:   Is patient pregnant?      Answer:   No    Order Specific Question:   Preferred imaging location?    Answer:   [facility]     Order Specific Question:   Release to patient    Answer:   Immediate    Order Specific Question:   Is Oral Contrast requested for this exam?    Answer:   Yes, Per Radiology protocol    Order Specific Question:   Radiology Contrast Protocol - do NOT remove file path    Answer:   _0 [email or web address]  . CT Chest W Contrast    Standing Status:   Future    Standing Expiration Date:   07/05/2020    Order Specific Question:   If indicated for the ordered procedure, I authorize the administration of contrast media per Radiology protocol    Answer:   Yes    Order Specific Question:   Is patient pregnant?    Answer:   No    Order Specific Question:   Preferred imaging location?    Answer:   [facility]    Order Specific Question:   Radiology Contrast Protocol - do NOT remove file path    Answer:   _1 [email or web address]   All questions were answered. The patient knows to call the clinic with any problems, questions or concerns. No barriers to learning was detected. The total time spent in the appointment was 30 minutes.     [name], MD 07/06/2019   I, Joslyn Devon, am acting as scribe for [name], MD.   I have reviewed the above documentation for accuracy and completeness, and I agree with the above.

## 2019-07-06 ENCOUNTER — Inpatient Hospital Stay: Payer: Medicaid Other | Attending: Hematology

## 2019-07-06 ENCOUNTER — Telehealth: Payer: Self-pay | Admitting: Hematology

## 2019-07-06 ENCOUNTER — Other Ambulatory Visit: Payer: Self-pay

## 2019-07-06 ENCOUNTER — Inpatient Hospital Stay (HOSPITAL_BASED_OUTPATIENT_CLINIC_OR_DEPARTMENT_OTHER): Payer: Medicaid Other | Admitting: Hematology

## 2019-07-06 ENCOUNTER — Encounter: Payer: Self-pay | Admitting: Hematology

## 2019-07-06 VITALS — BP 133/65 | HR 84 | Temp 97.9°F | Resp 18 | Ht 62.0 in | Wt 149.9 lb

## 2019-07-06 DIAGNOSIS — F17210 Nicotine dependence, cigarettes, uncomplicated: Secondary | ICD-10-CM | POA: Insufficient documentation

## 2019-07-06 DIAGNOSIS — F329 Major depressive disorder, single episode, unspecified: Secondary | ICD-10-CM | POA: Diagnosis not present

## 2019-07-06 DIAGNOSIS — C771 Secondary and unspecified malignant neoplasm of intrathoracic lymph nodes: Secondary | ICD-10-CM | POA: Insufficient documentation

## 2019-07-06 DIAGNOSIS — J449 Chronic obstructive pulmonary disease, unspecified: Secondary | ICD-10-CM | POA: Diagnosis not present

## 2019-07-06 DIAGNOSIS — F419 Anxiety disorder, unspecified: Secondary | ICD-10-CM | POA: Diagnosis not present

## 2019-07-06 DIAGNOSIS — C3492 Malignant neoplasm of unspecified part of left bronchus or lung: Secondary | ICD-10-CM

## 2019-07-06 DIAGNOSIS — C7931 Secondary malignant neoplasm of brain: Secondary | ICD-10-CM

## 2019-07-06 DIAGNOSIS — C3402 Malignant neoplasm of left main bronchus: Secondary | ICD-10-CM | POA: Diagnosis not present

## 2019-07-06 DIAGNOSIS — C3412 Malignant neoplasm of upper lobe, left bronchus or lung: Secondary | ICD-10-CM

## 2019-07-06 DIAGNOSIS — C3490 Malignant neoplasm of unspecified part of unspecified bronchus or lung: Secondary | ICD-10-CM

## 2019-07-06 DIAGNOSIS — F32A Depression, unspecified: Secondary | ICD-10-CM

## 2019-07-06 LAB — CMP (CANCER CENTER ONLY)
ALT: 45 U/L — ABNORMAL HIGH (ref 0–44)
AST: 31 U/L (ref 15–41)
Albumin: 3.9 g/dL (ref 3.5–5.0)
Alkaline Phosphatase: 81 U/L (ref 38–126)
Anion gap: 11 (ref 5–15)
BUN: 24 mg/dL — ABNORMAL HIGH (ref 6–20)
CO2: 28 mmol/L (ref 22–32)
Calcium: 10.3 mg/dL (ref 8.9–10.3)
Chloride: 105 mmol/L (ref 98–111)
Creatinine: 1 mg/dL (ref 0.44–1.00)
GFR, Est AFR Am: >60 mL/min (ref >60)
GFR, Estimated: >60 mL/min (ref >60)
Glucose, Bld: 86 mg/dL (ref 70–99)
Potassium: 3.9 mmol/L (ref 3.5–5.1)
Sodium: 144 mmol/L (ref 135–145)
Total Bilirubin: <0.2 mg/dL — ABNORMAL LOW (ref 0.3–1.2)
Total Protein: 7.6 g/dL (ref 6.5–8.1)

## 2019-07-06 LAB — CBC WITH DIFFERENTIAL (CANCER CENTER ONLY)
Abs Immature Granulocytes: 0.02 10*3/uL (ref 0.00–0.07)
Basophils Absolute: 0.1 10*3/uL (ref 0.0–0.1)
Basophils Relative: 1 %
Eosinophils Absolute: 0.1 10*3/uL (ref 0.0–0.5)
Eosinophils Relative: 1 %
HCT: 44.2 % (ref 36.0–46.0)
Hemoglobin: 14.2 g/dL (ref 12.0–15.0)
Immature Granulocytes: 0 %
Lymphocytes Relative: 11 %
Lymphs Abs: 0.9 10*3/uL (ref 0.7–4.0)
MCH: 30 pg (ref 26.0–34.0)
MCHC: 32.1 g/dL (ref 30.0–36.0)
MCV: 93.4 fL (ref 80.0–100.0)
Monocytes Absolute: 0.7 10*3/uL (ref 0.1–1.0)
Monocytes Relative: 9 %
Neutro Abs: 6 10*3/uL (ref 1.7–7.7)
Neutrophils Relative %: 78 %
Platelet Count: 275 10*3/uL (ref 150–400)
RBC: 4.73 MIL/uL (ref 3.87–5.11)
RDW: 13.2 % (ref 11.5–15.5)
WBC Count: 7.8 10*3/uL (ref 4.0–10.5)
nRBC: 0 % (ref 0.0–0.2)

## 2019-07-06 MED ORDER — OXYCODONE HCL 10 MG PO TABS: 10.0000 mg | ORAL_TABLET | Freq: Three times a day (TID) | ORAL | 0 refills | Status: DC | PRN

## 2019-07-06 MED ORDER — PROMETHAZINE HCL 25 MG PO TABS: 25.0000 mg | ORAL_TABLET | Freq: Four times a day (QID) | ORAL | 1 refills | Status: DC | PRN

## 2019-07-06 MED ORDER — LORAZEPAM 1 MG PO TABS: 1.0000 mg | ORAL_TABLET | Freq: Two times a day (BID) | ORAL | 0 refills | Status: DC | PRN

## 2019-07-06 NOTE — Telephone Encounter (Signed)
Scheduled appt per 6/9 los.  Printed calendar and avs,

## 2019-07-13 ENCOUNTER — Ambulatory Visit (HOSPITAL_COMMUNITY)
Admission: RE | Admit: 2019-07-13 | Discharge: 2019-07-13 | Disposition: A | Discharge status: Home / Self Care | Payer: Medicaid Other | Source: Ambulatory Visit | Attending: Radiation Oncology | Admitting: Radiation Oncology

## 2019-07-13 ENCOUNTER — Other Ambulatory Visit: Payer: Self-pay

## 2019-07-13 DIAGNOSIS — C7931 Secondary malignant neoplasm of brain: Secondary | ICD-10-CM | POA: Insufficient documentation

## 2019-07-13 DIAGNOSIS — C7949 Secondary malignant neoplasm of other parts of nervous system: Secondary | ICD-10-CM | POA: Insufficient documentation

## 2019-07-13 MED ORDER — GADOBUTROL 1 MMOL/ML IV SOLN
6.5000 mL | Freq: Once | INTRAVENOUS | Status: AC | PRN
  Administered 2019-07-13: 6.5 mL via INTRAVENOUS

## 2019-07-18 ENCOUNTER — Inpatient Hospital Stay: Payer: Medicaid Other

## 2019-07-19 ENCOUNTER — Telehealth: Payer: Self-pay | Admitting: Internal Medicine

## 2019-07-19 ENCOUNTER — Other Ambulatory Visit: Payer: Self-pay

## 2019-07-19 ENCOUNTER — Inpatient Hospital Stay (HOSPITAL_BASED_OUTPATIENT_CLINIC_OR_DEPARTMENT_OTHER): Payer: Medicaid Other | Admitting: Internal Medicine

## 2019-07-19 VITALS — BP 139/77 | HR 107 | Temp 98.1°F | Resp 18 | Ht 62.0 in | Wt 151.7 lb

## 2019-07-19 DIAGNOSIS — C7931 Secondary malignant neoplasm of brain: Secondary | ICD-10-CM | POA: Diagnosis not present

## 2019-07-19 DIAGNOSIS — R519 Headache, unspecified: Secondary | ICD-10-CM

## 2019-07-19 DIAGNOSIS — C3412 Malignant neoplasm of upper lobe, left bronchus or lung: Secondary | ICD-10-CM | POA: Diagnosis not present

## 2019-07-19 MED ORDER — DEXAMETHASONE 4 MG PO TABS: 4.0000 mg | ORAL_TABLET | Freq: Every day | ORAL | 0 refills | Status: DC

## 2019-07-19 NOTE — Telephone Encounter (Signed)
Scheduled per los. Gave avs and calendar  

## 2019-07-19 NOTE — Progress Notes (Signed)
[place] at [place], [place] [address] [phone]   Interval Evaluation  Date of Service: 07/19/19 Patient Name: Marissa [name] Patient MRN: [id number] Patient DOB: [birth year] Provider: Ventura Sellers, MD  Identifying Statement:  Marissa [name] is a 54 y.o. female with Brain metastases ([place]) [C79.31]  Primary Cancer:  Oncologic History: Oncology History Overview Note  Cancer Staging Small cell lung cancer, left upper lobe [facility]) Staging form: Lung, AJCC 8th Edition - Clinical stage from 08/24/2018: Stage IIIA (cT1a, cN2, cM0) - Signed by [name], MD on 10/17/2018    Small cell lung cancer, left upper lobe ([place])  07/29/2018 Imaging   CT Angio Chest 07/29/18  IMPRESSION: 1. Malignant appearing left mediastinal and hilar lymphadenopathy narrowing the airways at the left hilum. In the left lung only a subtle 9 mm spiculated nodule is identified in the lingula. Consider small cell carcinoma. Bronchoscopy or mediastinal node sampling might be most valuable for diagnosis. 2. Indeterminate although low-density (which typically which indicates a benign adenoma) left adrenal nodule. 3.  Negative for acute pulmonary embolus.   08/24/2018 Definitive Surgery   Bronchoscopy and EBUS biopsy on 08/24/18 by Dr. Loanne Drilling   08/24/2018 Initial Biopsy   Diagnosis 08/24/18 FINE NEEDLE ASPIRATION, ENDOSCOPIC, (EBUS) STATION # 7 LYMPH NODE(SPECIMEN 1 OF 4 COLLECTED 08/24/18): NO MALIGNANT CELLS IDENTIFIED.  FINE NEEDLE ASPIRATION, ENDOSCOPIC, (EBUS) LEFT HILAR LYMPH NODE(SPECIMEN 2 OF 4 COLLECTED 08/24/18): NO MALIGNANT CELLS IDENTIFIED.  TRANSBRONCHIAL NEEDLE ASPIRATION, WANG, (SPECIMEN 3 OF 4 COLLECTED 08/24/18): NO MALIGNANT CELLS IDENTIFIED.  BRONCHIAL BRUSHING, LUL (SPECIMEN 4 OF 4 COLLECTED 08/24/18): MALIGNANT CELLS PRESENT, MOST CONSISTENT WITH SMALL CELL CARCINOMA. SEE COMMENT.   08/24/2018 Cancer Staging   Staging form: Lung, AJCC 8th  Edition - Clinical stage from 08/24/2018: Stage IIIA (cT1a, cN2, cM0) - Signed by [name], MD on 10/17/2018   08/26/2018 Initial Diagnosis   Small cell lung cancer, left ([place])   08/26/2018 Imaging   CT AP W Contrast 08/26/18  IMPRESSION: 1. 2.9 cm left adrenal nodule cannot be definitively characterized on this study. Potentially a lipid poor adenoma, but metastatic disease not excluded. MRI of the abdomen with and without contrast may prove helpful to further evaluate. 2. No other findings to suggest metastatic disease in the abdomen/pelvis. 3. Left base atelectasis with small left pleural effusion.   08/27/2018 Imaging   MRI brain 08/27/18  IMPRESSION: 1. Motion degraded, incomplete examination. 2. Subcentimeter focus of diffusion signal abnormality in the left cerebellum. No enhancement is evident to strongly suggest a metastasis, and this may reflect an acute to subacute small vessel infarct. 3. No evidence of intracranial metastatic disease elsewhere. 4. Given motion artifact and incomplete postcontrast imaging, consider short-term follow-up MRI (possibly with sedation) to further evaluate the left cerebellar abnormality and provide a more thorough evaluation for metastatic disease.   09/02/2018 PET scan   IMPRESSION: 1. Hypermetabolic left hilar mass is identified encasing the left mainstem bronchus resulting in postobstructive atelectasis in the left upper lobe. 2. Hypermetabolic left mediastinal nodal metastasis. 3. Loculated left pleural effusion is identified with mild increased FDG uptake. Cannot rule out malignant pleural effusion. 4. No evidence for metastatic disease to the abdomen or pelvis or skeletal structures.     09/07/2018 - 11/10/2018 Chemotherapy   Concurrent chemoRT with IV cisplatin on day 1 and etopside day 1-3 for every 3 weeks starting 8/11. Will start radiation with Dr. Lisbeth Renshaw with cycle 2. She completed 4 cycles  on 11/10/18   09/14/2018 Procedure    Thoracentesis yielding 750 mL   09/28/2018 - 12/09/2019 Radiation Therapy   Concurrent chemoRT with Dr. Lisbeth Renshaw. Start with cycle 2 chemo on 09/28/18 and completed on 12/09/18.    02/10/2019 Imaging   CT CAP W Contrast  IMPRESSION: 1. Interval response to therapy. Significant interval reduction in tumor burden involving the left hemithorax. 2. No new or progressive disease identified within the chest, abdomen or pelvis. 3. Emphysema and aortic atherosclerosis. 4. Stable left adrenal nodule.   Aortic Atherosclerosis (ICD10-I70.0) and Emphysema (ICD10-J43.9).   02/11/2019 Imaging   MRI Brain  IMPRESSION: 6 x 10 mm solitary metastatic deposit right frontal lobe with minimal edema.   02/28/2019 - 03/14/2019 Radiation Therapy   Target Brain Radiation with Dr. Lisbeth Renshaw    05/19/2019 Imaging   CT CAP w contrast  IMPRESSION: 1. Trace residual soft tissue thickening at the left lung apex and along the AP window. Slight increase in size of a low left paratracheal lymph node. Continued attention on follow-up exams is warranted. No evidence of distant metastatic disease. 2. Subpleural density in the posterior left upper lobe is new and may be due to atelectasis. Recommend attention on follow-up. 3. Left adrenal adenoma. 4. Aortic atherosclerosis (ICD10-I70.0). Coronary artery calcification. 5.  Emphysema (ICD10-J43.9).     Interaval History:  Marissa [name] presents today for follow up after recent MRI brain.  She describes new onset of subtle dizziness, imbalance and impaired memory over the past few weeks.  She also complains of increased fatigue and poor energy.  Overall "just doesn't feel right". Fortunately, her headaches have improved since she corrected her vision with new glasses and reduced screen time on her phone.  No new medications, continues on observation through Dr. Burr Medico.  H+P (02/21/19) Patient describes years long history of frequent headaches.  They are described as lateralized  throbbing with nausea and photophobia, lasting for hours up to an entire day.  For the past month her headaches have been daily and of particular burden.  No time of day or positional association.  For cancer associated LUQ pain, she has been taking oxycodone 5mg  5-6x per day over the past ~6 months.  Prior to that she had some chronic use of opiates for back pain.  Also reports 2-3x per day usage of "goody powder" although she stopped that several days ago.  Smokes marijuana frequently and takes ativan for anxiety symptoms.  Currently scheduled for WBRT for small R frontal metastasis identified on screening MRI.  SIM'd today.  Currently on observation for small cell lung ca.    Medications: Current Outpatient Medications on File Prior to Visit  Medication Sig Dispense Refill  . baclofen (LIORESAL) 10 MG tablet Take 1 tablet (10 mg total) by mouth 3 (three) times daily as needed for muscle spasms. 30 each 0  . dronabinol (MARINOL) 2.5 MG capsule Take 1 capsule (2.5 mg total) by mouth 2 (two) times daily before a meal. 30 capsule 0  . DULoxetine (CYMBALTA) 20 MG capsule Take 2 capsules (40 mg total) by mouth daily. Take 1 capsule by mouth daily for the first week, then increase to 2 caps daily 60 capsule 1  . gabapentin (NEURONTIN) 300 MG capsule Take 1 tab in morning and 2 tabs at bedtime 90 capsule 2  . LORazepam (ATIVAN) 1 MG tablet Take 1 tablet (1 mg total) by mouth every 12 (twelve) hours as needed for anxiety. 30 tablet 0  . Oxycodone HCl 10  MG TABS Take 1 tablet (10 mg total) by mouth every 8 (eight) hours as needed. 60 tablet 0  . prochlorperazine (COMPAZINE) 10 MG tablet Take 1 tablet (10 mg total) by mouth every 6 (six) hours as needed (Nausea or vomiting). 30 tablet 3  . promethazine (PHENERGAN) 25 MG tablet Take 1 tablet (25 mg total) by mouth every 6 (six) hours as needed for nausea or vomiting. 30 tablet 1  . diazepam (VALIUM) 10 MG tablet Take 1 tablet po 30 minutes prior to MRI or  radiation (Patient not taking: Reported on 07/19/2019) 5 tablet 0   No current facility-administered medications on file prior to visit.    Allergies:  Allergies  Allergen Reactions  . Penicillins Nausea And Vomiting    Did it involve swelling of the face/tongue/throat, SOB, or low BP? No Did it involve sudden or severe rash/hives, skin peeling, or any reaction on the inside of your mouth or nose? No Did you need to seek medical attention at a hospital or doctor's office? No When did it last happen?20 years If all above answers are "NO", may proceed with cephalosporin use.   Past Medical History:  Past Medical History:  Diagnosis Date  . Asthma   . Chronic headaches   . COPD (chronic obstructive pulmonary disease) ([place])   . Depression   . Heart murmur   . Small cell lung cancer [facility])    Past Surgical History:  Past Surgical History:  Procedure Laterality Date  . BRONCHIAL BIOPSY  08/24/2018   Procedure: BRONCHIAL BIOPSIES;  Surgeon: Margaretha Seeds, MD;  Location: [facility];  Service: Cardiopulmonary;;  . BRONCHIAL BRUSHINGS  08/24/2018   Procedure: BRONCHIAL BRUSHINGS;  Surgeon: Margaretha Seeds, MD;  Location: [facility];  Service: Cardiopulmonary;;  . BRONCHIAL NEEDLE ASPIRATION BIOPSY  08/24/2018   Procedure: BRONCHIAL NEEDLE ASPIRATION BIOPSIES;  Surgeon: Margaretha Seeds, MD;  Location: [facility];  Service: Cardiopulmonary;;  . ENDOBRONCHIAL ULTRASOUND N/A 08/24/2018   Procedure: ENDOBRONCHIAL ULTRASOUND;  Surgeon: Margaretha Seeds, MD;  Location: [facility];  Service: Cardiopulmonary;  Laterality: N/A;  . IR THORACENTESIS ASP PLEURAL SPACE W/IMG GUIDE  09/14/2018  . TUBAL LIGATION    . VIDEO BRONCHOSCOPY  08/24/2018   Procedure: VIDEO BRONCHOSCOPY;  Surgeon: Margaretha Seeds, MD;  Location: [facility];  Service: Cardiopulmonary;;   Social History:  Social History   Socioeconomic History  . Marital status: Single    Spouse name: Not on file  .  Number of children: Not on file  . Years of education: Not on file  . Highest education level: Not on file  Occupational History  . Not on file  Tobacco Use  . Smoking status: Current Every Day Smoker    Packs/day: 2.00    Years: 39.00    Pack years: 78.00    Types: Cigarettes    Start date: 06/22/1979  . Smokeless tobacco: Never Used  Vaping Use  . Vaping Use: Never used  Substance and Sexual Activity  . Alcohol use: Yes    Comment: occasional  . Drug use: Not Currently    Types: Marijuana  . Sexual activity: Yes    Birth control/protection: Surgical  Other Topics Concern  . Not on file  Social History Narrative  . Not on file   Social Determinants of Health   Financial Resource Strain:   . Difficulty of Paying Living Expenses:   Food Insecurity:   . Worried About Charity fundraiser in the Last Year:   .  Ran Out of Food in the Last Year:   Transportation Needs: Unmet Transportation Needs  . Lack of Transportation (Medical): Yes  . Lack of Transportation (Non-Medical): Yes  Physical Activity:   . Days of Exercise per Week:   . Minutes of Exercise per Session:   Stress:   . Feeling of Stress :   Social Connections:   . Frequency of Communication with Friends and Family:   . Frequency of Social Gatherings with Friends and Family:   . Attends Religious Services:   . Active Member of Clubs or Organizations:   . Attends Archivist Meetings:   Marland Kitchen Marital Status:   Intimate Partner Violence:   . Fear of Current or Ex-Partner:   . Emotionally Abused:   Marland Kitchen Physically Abused:   . Sexually Abused:    Family History:  Family History  Problem Relation Age of Onset  . Heart disease Mother   . Dementia Father     Review of Systems: Constitutional: Doesn't report fevers, chills or abnormal weight loss Eyes: Doesn't report blurriness of vision Ears, nose, mouth, throat, and face: Doesn't report sore throat Respiratory: Doesn't report cough, dyspnea or  wheezes Cardiovascular: Doesn't report palpitation, chest discomfort  Gastrointestinal:  Doesn't report nausea, constipation, diarrhea GU: Doesn't report incontinence Skin: Doesn't report skin rashes Neurological: Per HPI Musculoskeletal: Back pain Behavioral/Psych: Doesn't report anxiety  Physical Exam: 02/21/19 02/21/19 02/03/19   Last reading 3:03 PM 2:30 PM 2:33 PM  BP -- 112/57 147/90 [[place] LPN is aware]  Pulse Rate -- 91 86  Resp -- 18 17  Temp -- 99.1 F (37.3 C) 98.2 F (36.8 C)  Temp Source -- Temporal Temporal  SpO2 -- 97 % 95 %  Weight -- 162 lb 6.4 oz (73.7 kg) 159 lb 8 oz (72.3 kg)  Height -- -- 5\' 2"  (1.575 m)  Pain Score 10 10 8     KPS: 90. General: Alert, cooperative, pleasant, in no acute distress Head: Normal EENT: No conjunctival injection or scleral icterus.  Lungs: Resp effort normal Cardiac: Regular rate Abdomen: Non-distended abdomen Skin: No rashes cyanosis or petechiae. Extremities: No clubbing or edema  Neurologic Exam: Mental Status: Awake, alert, attentive to examiner. Oriented to self and environment. Language is fluent with intact comprehension.  Cranial Nerves: Visual acuity is grossly normal. Visual fields are full. Extra-ocular movements intact. No ptosis. Face is symmetric Motor: Tone and bulk are normal. Power is full in both arms and legs.  Sensory: Intact to light touch Gait: Normal.   Labs: I have reviewed the data as listed    Component Value Date/Time   NA 144 07/06/2019 0907   K 3.9 07/06/2019 0907   CL 105 07/06/2019 0907   CO2 28 07/06/2019 0907   GLUCOSE 86 07/06/2019 0907   BUN 24 (H) 07/06/2019 0907   CREATININE 1.00 07/06/2019 0907   CALCIUM 10.3 07/06/2019 0907   PROT 7.6 07/06/2019 0907   ALBUMIN 3.9 07/06/2019 0907   AST 31 07/06/2019 0907   ALT 45 (H) 07/06/2019 0907   ALKPHOS 81 07/06/2019 0907   BILITOT <0.2 (L) 07/06/2019 0907   GFRNONAA >60 07/06/2019 0907   GFRAA >60 07/06/2019 0907   Lab Results   Component Value Date   WBC 7.8 07/06/2019   NEUTROABS 6.0 07/06/2019   HGB 14.2 07/06/2019   HCT 44.2 07/06/2019   MCV 93.4 07/06/2019   PLT 275 07/06/2019   Imaging:  [place] Clinician Interpretation: I have personally reviewed the CNS images  as listed.  My interpretation, in the context of the patient's clinical presentation, is likely treatment effect  MR Brain W Wo Contrast  Result Date: 07/14/2019 CLINICAL DATA:  Metastatic lung cancer. Whole brain radiation therapy completed on 03/14/2019. EXAM: MRI HEAD WITHOUT AND WITH CONTRAST TECHNIQUE: Multiplanar, multiecho pulse sequences of the brain and surrounding structures were obtained without and with intravenous contrast. CONTRAST:  6.93mL GADAVIST GADOBUTROL 1 MMOL/ML IV SOLN COMPARISON:  None. FINDINGS: BRAIN New Lesions: None. Larger lesions: 1. 2.6 x 2.0 cm heterogeneously but solidly enhancing lesion in the right frontal lobe with new moderate edema and small volume chronic blood products (series 9, image 89, previously 1.0 x 0.6 cm). Stable or Smaller lesions: None. Other Brain findings: There is no acute infarct or extra-axial fluid collection. Right frontal lobe edema results in regional sulcal effacement without midline shift. Small foci of T2 hyperintensity in the cerebral white matter bilaterally are nonspecific but compatible with minimal chronic small vessel ischemic disease. The ventricles are normal in size. Vascular: Major intracranial vascular flow voids are preserved. Skull and upper cervical spine: Unremarkable bone marrow signal. Sinuses/Orbits: Unremarkable orbits. Clear paranasal sinuses. New small to moderate bilateral mastoid effusions. Other: None. IMPRESSION: 1. Increased size of right frontal lobe metastasis with new moderate edema. 2. No evidence of new intracranial metastases. Electronically Signed   By: Logan Bores M.D.   On: 07/14/2019 10:35      Assessment/Plan Brain metastases [facility]) [C79.31]  Marissa [name] has  clinical and radiographic changes today strongly suggestive of radio-inflammatory process involving treated right frontal metastasis.  This is evident by degree of T2 surround, high rate of growth compared to organic tumor, hypo-intense signal within tumor suggesting necrosis.  We recommended dosing dexamethasone at 4mg  daily for the time being.  We will call her in 2 weeks for a "check-in" and continue to titrate steroids as needed.  Our recommendation would be to repeat MRI brain in 1 month for further characterization of this progression.  If continues to grow could consider advanced imaging or resection/biopsy/LITT.  We appreciate the opportunity to participate in the care of Marissa [name].    All questions were answered. The patient knows to call the clinic with any problems, questions or concerns. No barriers to learning were detected.  The total time spent in the encounter was 40 minutes and more than 50% was on counseling and review of test results   Ventura Sellers, MD Medical Director of Neuro-Oncology [facility] [place] 07/19/19 12:20 PM

## 2019-07-26 ENCOUNTER — Telehealth: Payer: Self-pay

## 2019-07-26 NOTE — Telephone Encounter (Signed)
Marissa [name] left vm requesting refill for oxycodone.

## 2019-07-27 ENCOUNTER — Other Ambulatory Visit: Payer: Self-pay | Admitting: Radiation Therapy

## 2019-07-27 ENCOUNTER — Other Ambulatory Visit: Payer: Self-pay | Admitting: Hematology

## 2019-07-27 MED ORDER — OXYCODONE HCL 10 MG PO TABS: 10.0000 mg | ORAL_TABLET | Freq: Three times a day (TID) | ORAL | 0 refills | Status: DC | PRN

## 2019-07-28 ENCOUNTER — Telehealth: Payer: Self-pay

## 2019-07-28 ENCOUNTER — Telehealth: Payer: Self-pay | Admitting: *Deleted

## 2019-07-28 ENCOUNTER — Other Ambulatory Visit: Payer: Self-pay | Admitting: Hematology

## 2019-07-28 MED ORDER — OXYCODONE HCL 10 MG PO TABS: 10.0000 mg | ORAL_TABLET | Freq: Three times a day (TID) | ORAL | 0 refills | Status: DC | PRN

## 2019-07-28 NOTE — Telephone Encounter (Signed)
Patient called to report no improvement with steroids and describes feeling "swoosy" and states she is having a sharp pain in her head, not a headache.    Scheduled interim phone visit with Dr. Mickeal Skinner for tomorrow to evaluate steroids and need for further follow up.

## 2019-07-28 NOTE — Telephone Encounter (Signed)
Ms Doxtater called stating we needed to [facility] [address] regarding her oxycodone. The oxycodone rx was sent to [facility] yesterday.  She does not have transportation available to pick up at [facility].  She also states she is having sharp pains in her head.  Dr. Mickeal Skinner has been treating her for this.  I transferred her to Dr. Renda Rolls nurse

## 2019-07-29 ENCOUNTER — Inpatient Hospital Stay: Payer: Medicaid Other | Attending: Hematology | Admitting: Internal Medicine

## 2019-07-29 ENCOUNTER — Telehealth: Payer: Self-pay | Admitting: *Deleted

## 2019-07-29 DIAGNOSIS — F329 Major depressive disorder, single episode, unspecified: Secondary | ICD-10-CM | POA: Insufficient documentation

## 2019-07-29 DIAGNOSIS — F17210 Nicotine dependence, cigarettes, uncomplicated: Secondary | ICD-10-CM | POA: Insufficient documentation

## 2019-07-29 DIAGNOSIS — Z923 Personal history of irradiation: Secondary | ICD-10-CM | POA: Insufficient documentation

## 2019-07-29 DIAGNOSIS — J449 Chronic obstructive pulmonary disease, unspecified: Secondary | ICD-10-CM | POA: Insufficient documentation

## 2019-07-29 DIAGNOSIS — C3492 Malignant neoplasm of unspecified part of left bronchus or lung: Secondary | ICD-10-CM

## 2019-07-29 DIAGNOSIS — R519 Headache, unspecified: Secondary | ICD-10-CM | POA: Diagnosis not present

## 2019-07-29 DIAGNOSIS — C771 Secondary and unspecified malignant neoplasm of intrathoracic lymph nodes: Secondary | ICD-10-CM | POA: Insufficient documentation

## 2019-07-29 DIAGNOSIS — D509 Iron deficiency anemia, unspecified: Secondary | ICD-10-CM | POA: Insufficient documentation

## 2019-07-29 DIAGNOSIS — C7931 Secondary malignant neoplasm of brain: Secondary | ICD-10-CM | POA: Diagnosis not present

## 2019-07-29 DIAGNOSIS — F419 Anxiety disorder, unspecified: Secondary | ICD-10-CM | POA: Insufficient documentation

## 2019-07-29 DIAGNOSIS — I2510 Atherosclerotic heart disease of native coronary artery without angina pectoris: Secondary | ICD-10-CM | POA: Insufficient documentation

## 2019-07-29 DIAGNOSIS — Z7952 Long term (current) use of systemic steroids: Secondary | ICD-10-CM | POA: Insufficient documentation

## 2019-07-29 DIAGNOSIS — Z79899 Other long term (current) drug therapy: Secondary | ICD-10-CM | POA: Insufficient documentation

## 2019-07-29 DIAGNOSIS — C3412 Malignant neoplasm of upper lobe, left bronchus or lung: Secondary | ICD-10-CM | POA: Insufficient documentation

## 2019-07-29 MED ORDER — DEXAMETHASONE 2 MG PO TABS
2.0000 mg | ORAL_TABLET | Freq: Every day | ORAL | 0 refills | Status: DC
Start: 2019-07-29 — End: 2019-09-21

## 2019-07-29 MED ORDER — GABAPENTIN 300 MG PO CAPS: ORAL_CAPSULE | ORAL | 2 refills | Status: DC

## 2019-07-29 NOTE — Progress Notes (Signed)
I connected with Marissa [name] on 07/29/19 at 12:00 PM EDT by telephone visit and verified that I am speaking with the correct person using two identifiers.  I discussed the limitations, risks, security and privacy concerns of performing an evaluation and management service by telemedicine and the availability of in-person appointments. I also discussed with the patient that there may be a patient responsible charge related to this service. The patient expressed understanding and agreed to proceed.  Other persons participating in the visit and their role in the encounter:  n/a  Patient's location:  Office  Provider's location:  Home  Chief Complaint:  Brain metastases ([place])  Chronic daily headache  Small cell lung cancer, left ([place]) - Plan: gabapentin (NEURONTIN) 300 MG capsule   History of Present Ilness: Marissa [name] describes minimal improvement in fatigue issues since starting decadron 4mg  daily, continues to have recurrent headaches.  Headaches are right frontal, last for 10 minutes.  Taking goody powder which has been ineffective. Observations: Language and cognition normal Assessment and Plan: Brain metastases ([facility])  Chronic daily headache  Small cell lung cancer, left ([facility]) - Plan: gabapentin (NEURONTIN) 300 MG capsule  Will recommend decreasing decadron to 2mg  daily due to poor efficacy, will con't lower dose given clear burden of inflammation within right frontal lobe on recent MRI scan.  For headaches recommended restarting Gabapentin (300/600) which she had run out of recently. Follow Up Instructions: RTC following MRI brain next month.  I discussed the assessment and treatment plan with the patient.  The patient was provided an opportunity to ask questions and all were answered.  The patient agreed with the plan and demonstrated understanding of the instructions.    The patient was advised to call back or seek an in-person evaluation if the symptoms worsen or if the condition fails  to improve as anticipated.  I provided 5-10 minutes of non-face-to-face time during this enocunter.  Ventura Sellers, MD   I provided 15 minutes of non face-to-face telephone visit time during this encounter, and > 50% was spent counseling as documented under my assessment & plan.

## 2019-07-29 NOTE — Telephone Encounter (Signed)
Asking if the MRI brain ordered needs to be done at [facility] or can it be done at [facility]? Last MRI brain said to do by [facility] protocol with 3T scanner. IF this is still required it will need to be done at [facility].

## 2019-08-02 ENCOUNTER — Telehealth: Payer: Self-pay

## 2019-08-02 ENCOUNTER — Telehealth: Payer: Self-pay | Admitting: *Deleted

## 2019-08-02 ENCOUNTER — Other Ambulatory Visit: Payer: Self-pay

## 2019-08-02 DIAGNOSIS — C3492 Malignant neoplasm of unspecified part of left bronchus or lung: Secondary | ICD-10-CM

## 2019-08-02 MED ORDER — PROCHLORPERAZINE MALEATE 10 MG PO TABS: 10.0000 mg | ORAL_TABLET | Freq: Four times a day (QID) | ORAL | 3 refills | Status: DC | PRN

## 2019-08-02 MED ORDER — DIAZEPAM 10 MG PO TABS: ORAL_TABLET | ORAL | 0 refills | Status: DC

## 2019-08-02 NOTE — Addendum Note (Signed)
Addended by: Ventura Sellers on: 08/02/2019 05:05 PM   Modules accepted: Orders

## 2019-08-02 NOTE — Telephone Encounter (Signed)
Patient got her upcoming MRI scheduled.  She states that she is claustraphobic and needs a refill for Valium for MRI.  She said that the Ativan doesn't work because she takes it routinely for anxiety.  Routed to MD to see previous Rx for Valium to [facility] because she can't get a ride to there.

## 2019-08-02 NOTE — Telephone Encounter (Signed)
Marissa [name] called requesting refill for oxy, she only got 15 tabs on 7/1 because of the new medicaid system.  She has now been authorized for 70. She is also requesting refill for compazine and marinol.  I refilled the compazine.

## 2019-08-02 NOTE — Telephone Encounter (Signed)
eceived faxed notification of the approval of oxycodone 10 mg tablets quantity units of 60, 07/29/2019-01/25/2020.

## 2019-08-03 ENCOUNTER — Other Ambulatory Visit: Payer: Self-pay | Admitting: Hematology

## 2019-08-03 MED ORDER — OXYCODONE HCL 10 MG PO TABS: 10.0000 mg | ORAL_TABLET | Freq: Three times a day (TID) | ORAL | 0 refills | Status: DC | PRN

## 2019-08-10 ENCOUNTER — Other Ambulatory Visit: Payer: Self-pay

## 2019-08-10 ENCOUNTER — Ambulatory Visit (HOSPITAL_COMMUNITY)
Admission: RE | Admit: 2019-08-10 | Discharge: 2019-08-10 | Disposition: A | Discharge status: Home / Self Care | Payer: Medicaid Other | Source: Ambulatory Visit | Attending: Hematology | Admitting: Hematology

## 2019-08-10 DIAGNOSIS — C3412 Malignant neoplasm of upper lobe, left bronchus or lung: Secondary | ICD-10-CM

## 2019-08-10 MED ORDER — SODIUM CHLORIDE (PF) 0.9 % IJ SOLN
INTRAMUSCULAR | Status: AC
  Filled 2019-08-10: qty 50

## 2019-08-10 MED ORDER — IOHEXOL 300 MG/ML  SOLN
100.0000 mL | Freq: Once | INTRAMUSCULAR | Status: AC | PRN
  Administered 2019-08-10: 100 mL via INTRAVENOUS

## 2019-08-12 ENCOUNTER — Ambulatory Visit: Payer: Medicaid Other | Admitting: Internal Medicine

## 2019-08-15 ENCOUNTER — Telehealth: Payer: Self-pay | Admitting: *Deleted

## 2019-08-15 ENCOUNTER — Inpatient Hospital Stay: Payer: Medicaid Other

## 2019-08-15 NOTE — Progress Notes (Addendum)
[place]   [phone] [phone]   Clinic Follow up Note   Patient Care Team: Gildardo Pounds, NP as PCP - General (Nurse Practitioner)  Date of Service:  08/18/2019  CHIEF COMPLAINT: F/u of Small Cell Lung Cancer  SUMMARY OF ONCOLOGIC HISTORY: Oncology History Overview Note  Cancer Staging Small cell lung cancer, left upper lobe ([place]) Staging form: Lung, AJCC 8th Edition - Clinical stage from 08/24/2018: Stage IIIA (cT1a, cN2, cM0) - Signed by [name], MD on 10/17/2018    Small cell lung cancer, left upper lobe ([place])  07/29/2018 Imaging   CT Angio Chest 07/29/18  IMPRESSION: 1. Malignant appearing left mediastinal and hilar lymphadenopathy narrowing the airways at the left hilum. In the left lung only a subtle 9 mm spiculated nodule is identified in the lingula. Consider small cell carcinoma. Bronchoscopy or mediastinal node sampling might be most valuable for diagnosis. 2. Indeterminate although low-density (which typically which indicates a benign adenoma) left adrenal nodule. 3.  Negative for acute pulmonary embolus.   08/24/2018 Definitive Surgery   Bronchoscopy and EBUS biopsy on 08/24/18 by Dr. Loanne Drilling   08/24/2018 Initial Biopsy   Diagnosis 08/24/18 FINE NEEDLE ASPIRATION, ENDOSCOPIC, (EBUS) STATION # 7 LYMPH NODE(SPECIMEN 1 OF 4 COLLECTED 08/24/18): NO MALIGNANT CELLS IDENTIFIED.  FINE NEEDLE ASPIRATION, ENDOSCOPIC, (EBUS) LEFT HILAR LYMPH NODE(SPECIMEN 2 OF 4 COLLECTED 08/24/18): NO MALIGNANT CELLS IDENTIFIED.  TRANSBRONCHIAL NEEDLE ASPIRATION, WANG, (SPECIMEN 3 OF 4 COLLECTED 08/24/18): NO MALIGNANT CELLS IDENTIFIED.  BRONCHIAL BRUSHING, LUL (SPECIMEN 4 OF 4 COLLECTED 08/24/18): MALIGNANT CELLS PRESENT, MOST CONSISTENT WITH SMALL CELL CARCINOMA. SEE COMMENT.   08/24/2018 Cancer Staging   Staging form: Lung, AJCC 8th Edition - Clinical stage from 08/24/2018: Stage IIIA (cT1a, cN2, cM0) - Signed by [name], MD on 10/17/2018    08/26/2018 Initial Diagnosis   Small cell lung cancer, left ([place])   08/26/2018 Imaging   CT AP W Contrast 08/26/18  IMPRESSION: 1. 2.9 cm left adrenal nodule cannot be definitively characterized on this study. Potentially a lipid poor adenoma, but metastatic disease not excluded. MRI of the abdomen with and without contrast may prove helpful to further evaluate. 2. No other findings to suggest metastatic disease in the abdomen/pelvis. 3. Left base atelectasis with small left pleural effusion.   08/27/2018 Imaging   MRI brain 08/27/18  IMPRESSION: 1. Motion degraded, incomplete examination. 2. Subcentimeter focus of diffusion signal abnormality in the left cerebellum. No enhancement is evident to strongly suggest a metastasis, and this may reflect an acute to subacute small vessel infarct. 3. No evidence of intracranial metastatic disease elsewhere. 4. Given motion artifact and incomplete postcontrast imaging, consider short-term follow-up MRI (possibly with sedation) to further evaluate the left cerebellar abnormality and provide a more thorough evaluation for metastatic disease.   09/02/2018 PET scan   IMPRESSION: 1. Hypermetabolic left hilar mass is identified encasing the left mainstem bronchus resulting in postobstructive atelectasis in the left upper lobe. 2. Hypermetabolic left mediastinal nodal metastasis. 3. Loculated left pleural effusion is identified with mild increased FDG uptake. Cannot rule out malignant pleural effusion. 4. No evidence for metastatic disease to the abdomen or pelvis or skeletal structures.     09/07/2018 - 11/10/2018 Chemotherapy   Concurrent chemoRT with IV cisplatin on day 1 and etopside day 1-3 for every 3 weeks starting 8/11. Will start radiation with Dr. Lisbeth Renshaw with cycle 2. She completed 4 cycles on 11/10/18   09/14/2018 Procedure   Thoracentesis yielding 750 mL  09/28/2018 - 12/09/2019 Radiation Therapy   Concurrent chemoRT with Dr. Lisbeth Renshaw.  Start with cycle 2 chemo on 09/28/18 and completed on 12/09/18.    02/10/2019 Imaging   CT CAP W Contrast  IMPRESSION: 1. Interval response to therapy. Significant interval reduction in tumor burden involving the left hemithorax. 2. No new or progressive disease identified within the chest, abdomen or pelvis. 3. Emphysema and aortic atherosclerosis. 4. Stable left adrenal nodule.   Aortic Atherosclerosis (ICD10-I70.0) and Emphysema (ICD10-J43.9).   02/11/2019 Imaging   MRI Brain  IMPRESSION: 6 x 10 mm solitary metastatic deposit right frontal lobe with minimal edema.   02/28/2019 - 03/14/2019 Radiation Therapy   Target Brain Radiation with Dr. Lisbeth Renshaw    05/19/2019 Imaging   CT CAP w contrast  IMPRESSION: 1. Trace residual soft tissue thickening at the left lung apex and along the AP window. Slight increase in size of a low left paratracheal lymph node. Continued attention on follow-up exams is warranted. No evidence of distant metastatic disease. 2. Subpleural density in the posterior left upper lobe is new and may be due to atelectasis. Recommend attention on follow-up. 3. Left adrenal adenoma. 4. Aortic atherosclerosis (ICD10-I70.0). Coronary artery calcification. 5.  Emphysema (ICD10-J43.9).   07/13/2019 Imaging   MRI brain  IMPRESSION: 1. Increased size of right frontal lobe metastasis with new moderate edema. 2. No evidence of new intracranial metastases.     08/10/2019 Imaging   CT CAP W Contrast  IMPRESSION: 1. New and progressive adenopathy within the mediastinum and low left neck, consistent with nodal metastasis. 2. No evidence of pulmonary metastasis. Similar areas of probable scarring and pleural thickening. 3. No subdiaphragmatic metastatic disease identified. 4. Left adrenal adenoma. 5. Age advanced aortic atherosclerosis (ICD10-I70.0) and emphysema (ICD10-J43.9).        CURRENT THERAPY:  Observation  INTERVAL HISTORY:  Marissa [name] is here for a  follow up of Small Cell Lung Cancer. She presents to the clinic alone. She notes her hair is growing back with midline balding. She notes her headaches are more sharp right frontal lobe pain that radiate to right occipital lobe. This pain has been going on for 3 weeks now. The pain occurs 4-5 times a day. She notes due to swelling around her brian tumor, she is on Dexamethasone.  She notes in the last month she has upper chest pain. She notes her breathing is becoming more labored. She notes she is not able to do as much at home but able to take care of herself at home with assistance. She is still having dizziness occasionally. She notes having stable vision. She is currently living with her boyfriend and roommate. She plans to move down the street due to current mold and current poor upkeep of housing. She notes she takes Ativan 1 pill twice a day. She notes sometimes she does not take them, mainly when no one is around her.  She notes she is not happy with Dr Mickeal Skinner and his team as she wants better control of recent pain of her brain.    REVIEW OF SYSTEMS:   Constitutional: Denies fevers, chills or abnormal weight loss (+) Sharp right frontal lobe pain (+) Occasional dizziness Eyes: Denies blurriness of vision Ears, nose, mouth, throat, and face: Denies mucositis or sore throat Respiratory: Denies cough or wheezes (+) increased labored breathing (+) Upper chest pain  Cardiovascular: Denies palpitation or lower extremity swelling Gastrointestinal:  Denies nausea, heartburn or change in bowel habits Skin: Denies abnormal skin rashes  Lymphatics: Denies new lymphadenopathy or easy bruising Neurological:Denies numbness, tingling or new weaknesses Behavioral/Psych: Mood is stable, no new changes  All other systems were reviewed with the patient and are negative.  MEDICAL HISTORY:  Past Medical History:  Diagnosis Date  . Asthma   . Chronic headaches   . COPD (chronic obstructive pulmonary disease)  ([place])   . Depression   . Heart murmur   . Small cell lung cancer ([place])     SURGICAL HISTORY: Past Surgical History:  Procedure Laterality Date  . BRONCHIAL BIOPSY  08/24/2018   Procedure: BRONCHIAL BIOPSIES;  Surgeon: Margaretha Seeds, MD;  Location: [facility];  Service: Cardiopulmonary;;  . BRONCHIAL BRUSHINGS  08/24/2018   Procedure: BRONCHIAL BRUSHINGS;  Surgeon: Margaretha Seeds, MD;  Location: [facility];  Service: Cardiopulmonary;;  . BRONCHIAL NEEDLE ASPIRATION BIOPSY  08/24/2018   Procedure: BRONCHIAL NEEDLE ASPIRATION BIOPSIES;  Surgeon: Margaretha Seeds, MD;  Location: [facility];  Service: Cardiopulmonary;;  . ENDOBRONCHIAL ULTRASOUND N/A 08/24/2018   Procedure: ENDOBRONCHIAL ULTRASOUND;  Surgeon: Margaretha Seeds, MD;  Location: [facility];  Service: Cardiopulmonary;  Laterality: N/A;  . IR THORACENTESIS ASP PLEURAL SPACE W/IMG GUIDE  09/14/2018  . TUBAL LIGATION    . VIDEO BRONCHOSCOPY  08/24/2018   Procedure: VIDEO BRONCHOSCOPY;  Surgeon: Margaretha Seeds, MD;  Location: [facility];  Service: Cardiopulmonary;;    I have reviewed the social history and family history with the patient and they are unchanged from previous note.  ALLERGIES:  is allergic to penicillins.  MEDICATIONS:  Current Outpatient Medications  Medication Sig Dispense Refill  . baclofen (LIORESAL) 10 MG tablet Take 1 tablet (10 mg total) by mouth 3 (three) times daily as needed for muscle spasms. 30 each 0  . dexamethasone (DECADRON) 2 MG tablet Take 1 tablet (2 mg total) by mouth daily. 30 tablet 0  . diazepam (VALIUM) 10 MG tablet Take 1 tablet po 30 minutes prior to MRI or radiation 5 tablet 0  . dronabinol (MARINOL) 2.5 MG capsule Take 1 capsule (2.5 mg total) by mouth 2 (two) times daily before a meal. 30 capsule 0  . dronabinol (MARINOL) 5 MG capsule Take 1 capsule (5 mg total) by mouth 2 (two) times daily before a meal. 60 capsule 0  . DULoxetine (CYMBALTA) 20 MG capsule Take 2  capsules (40 mg total) by mouth daily. Take 1 capsule by mouth daily for the first week, then increase to 2 caps daily 60 capsule 1  . gabapentin (NEURONTIN) 300 MG capsule Take 1 tab in morning and 2 tabs at bedtime 90 capsule 2  . LORazepam (ATIVAN) 1 MG tablet Take 1 tablet (1 mg total) by mouth every 12 (twelve) hours as needed for anxiety. 30 tablet 0  . Oxycodone HCl 10 MG TABS Take 1 tablet (10 mg total) by mouth every 6 (six) hours as needed. 90 tablet 0  . prochlorperazine (COMPAZINE) 10 MG tablet Take 1 tablet (10 mg total) by mouth every 6 (six) hours as needed (Nausea or vomiting). 30 tablet 3  . promethazine (PHENERGAN) 25 MG tablet Take 1 tablet (25 mg total) by mouth every 6 (six) hours as needed for nausea or vomiting. 30 tablet 1   No current facility-administered medications for this visit.    PHYSICAL EXAMINATION: ECOG PERFORMANCE STATUS: 2 - Symptomatic, <50% confined to bed  Vitals:   08/18/19 1354  BP: (!) 139/85  Pulse: 99  Resp: 18  Temp: 97.8 F (36.6 C)  SpO2:  98%   Filed Weights   08/18/19 1354  Weight: 164 lb 11.2 oz (74.7 kg)    GENERAL:alert, no distress and comfortable SKIN: skin color, texture, turgor are normal, no rashes or significant lesions EYES: normal, Conjunctiva are pink and non-injected, sclera clear  NECK: supple, thyroid normal size, non-tender, without nodularity LYMPH:  no palpable lymphadenopathy in the cervical, axillary  LUNGS: clear to auscultation and percussion (+) Wheezing  HEART: regular rate & rhythm and no murmurs and no lower extremity edema ABDOMEN:abdomen soft, non-tender and normal bowel sounds (+) Left abdominal pain with pressure Musculoskeletal:no cyanosis of digits and no clubbing  NEURO: alert & oriented x 3 with fluent speech, no focal motor/sensory deficits  LABORATORY DATA:  I have reviewed the data as listed CBC Latest Ref Rng & Units 08/18/2019 07/06/2019 05/05/2019  WBC 4.0 - 10.5 K/uL 12.2(H) 7.8 6.1   Hemoglobin 12.0 - 15.0 g/dL 13.3 14.2 13.1  Hematocrit 36 - 46 % 40.7 44.2 41.5  Platelets 150 - 400 K/uL 341 275 279     CMP Latest Ref Rng & Units 08/18/2019 07/06/2019 05/05/2019  Glucose 70 - 99 mg/dL 96 86 97  BUN 6 - 20 mg/dL 29(H) 24(H) 14  Creatinine 0.44 - 1.00 mg/dL 1.23(H) 1.00 0.82  Sodium 135 - 145 mmol/L 145 144 142  Potassium 3.5 - 5.1 mmol/L 4.6 3.9 3.3(L)  Chloride 98 - 111 mmol/L 106 105 104  CO2 22 - 32 mmol/L 28 28 32  Calcium 8.9 - 10.3 mg/dL 9.5 10.3 9.4  Total Protein 6.5 - 8.1 g/dL 7.4 7.6 7.1  Total Bilirubin 0.3 - 1.2 mg/dL <0.1(L) <0.2(L) 0.5  Alkaline Phos 38 - 126 U/L 67 81 99  AST 15 - 41 U/L '19 31 19  ' ALT 0 - 44 U/L 28 45(H) 17      RADIOGRAPHIC STUDIES: I have personally reviewed the radiological images as listed and agreed with the findings in the report. No results found.   ASSESSMENT & PLAN:  Marissa [name] is a 54 y.o. female with    1.Small cell lung cancer, limited stagein 07/2018,brain metastasis in 01/2019, mediastinal node recurrence in 07/2019 -She was diagnosed in 07/2018. Imaging showed 19m nodule of left lung with malignant appearing left mediastinal and hilar lymphadenopathy. Her Bronchoscopy biopsy confirmed mass to be small cell lung cancer while her left hilar andmediastinal lymph node biopsy were negative for malignant cells.  -CT AP and MRI brain negative for distant metastasis. PET scan from 09/02/18 shows Hypermetabolic left mediastinal nodal metastasis, but no distant metastasis.  -Given her locally advanced disease (stage III cancer), she underwent standard treatment with concurrent chemoRT with 4 cycles of cisplatin/etopside. -Her CT CAP from 02/10/19 showedmarked reduction of tumor burden in the left hemithorax, no new metastasis in chest, abdomen, or pelvis. She responded well to treatment.  -Unfortunately her Brain MRI from 02/11/19 showed 6x10 mm solitary brain metastasis in right frontal lobe. She completed radiation to brain  02/28/19-03/14/19 with Dr. MLisbeth Renshaw -I personally reviewed and discussed her CT CAP from 08/10/19 which shows new and progressive adenopathy within the mediastinum and low left neck consistent with nodal metastasis. No evidence of pulmonary or other distant metastasis. -I recommend restarting chemotherapy due to the recurrent disease.  Her last chemo was more than 6 months ago, I recommend restarted with [place] etoposide and atezolizumab -We discussed her disease is not curable at this stage, the goal of therapy is to prolong her life and improve her quality of life -  She also has been having increased sharp pain of right brain with mild dizziness. I will review this with Dr Mickeal Skinner.  -Labs reviewed, WBC 12.2, ANC 10.7, Cr 1.23.  -I discussed based on scan this is consistent with cancer progression. I briefly discussed restarting chemo to control her disease. She is open to this. Plan to address further after her next brain MRI on 08/25/19.  -F/u after scan  2. Headaches  -Her Chronic HA worsened with blurry vision in early 2021. -MRI brain on 02/11/19 showed 10 mm solitary brain met in right frontal lobe. She is s/p whole brain RT start 02/28/19-03/14/19.  -She notes her Headaches are less present with less screen time and usage of her glasses.  -She notes forgetting things since her RT. I recommend she write things down to help her remember.  -For the past 3 weeks (07/2019) she has had sharp pain of right brain along from frontal radiating to occipital areas along with dizziness. She notes this is not like her headaches before.  -Cymbalta and Gabapentin has not been helping this pain, so she has increased her oxycodone. I refilled Oxycodone 59m every 4 hours (08/18/19).  -She is currently on Dexa 251mdose.  -She will continue to f/u with Dr. VaMickeal SkinnerHer next brain MRI on 08/25/19  3. Smoking Cessation, COPD,H/oacute left tension pneumothorax in 07/2018 after bronchoscopy and EBUS biopsy (resolved) -She  has a h/o heavy smoking. She has COPD. She is no longer on oxygen.  -She has reduced her smoking to 2-3 cigarettes a day. I highly advised her to quit completely. She is still trying. -Shepreviouslynotedshe has reduced smoking to 10-11 cigarettes a day. -She notes she has been having upper chest pain with increased labored breathing in the past month (from late 06/2019). Exam shows wheezing today (08/18/19).  -She will continue inhaler and nebulizer.   4. Severe LUQ and left flank pain, right knee pain  -She had chest tube placed on left upper back. S/p removal she has had severe radiating LUQ pain.  -Ipreviouslyreferred her to Dr. HaDonell Sievertffice for pain management.Although her Medicaid has been approved, she cannot afford to see a specialist currently. -She is now being seen by Dr GoNancy Nordmannor pain management. -Dr. GoNancy Nordmannreviously prescribed her Fentanyl 2536mhr for 75 hours on 05/13/19 but did not take it due to concerns of the effects and threw it out. She remains on Oxycodone 58m5m-Her main pain is currently from her head and she notes recent left abdominal cramping along with her pain. She has left abdominal tenderness with pressure on exam today (08/18/19).   5.Nausea, vomiting, poor appetite  -She notes still having a low appetite. She has not gained back the weight she lost since cancer diagnosisbut her weight is trending up. -Continue Zofran and Compazine daily.  -[place] Marinol 1-2 times a day has helped with her eating. She will still take Marijuana as needed. I advised her not to take Marinol at the same time as Marijuana. I refilled Marinol 5mg 6may (08/18/19)  6. Iron deficient anemia, S/p 2 IV Iron doses in 07/2018.Currently resolved off chemo   7. Anxiety/Depression, irritability, Social Support  -She has a h/o of anxiety and depression and was previously on antidepressants but did not tolerate well. -In 11/2018 she was very irritable and anxious when  being around people and felt she could hurt someone. Her mood has much improved on Ativan as needed. She also takes Valium.  -She notes she uses Marijuana to  help her eat, but this does not irritate her. She is onlow dose oxycodonenow. She denies any other use of recreational drugs.  -We also previously referred her to [facility]. She will continue SW involvement. She has not seen [facility] provider yet.  -Currently on Medicaid, disability, food stamps. She gets $529 per month. Still does not have a car  -Mood seems more positive lately, continue Ativan. She currently takes Ativan 1 tab BID or not at all is remains in stable mood. I refilled Ativan today (08/18/19) -She plans to move soon based on poor upkeep of current housing.   8. Goals of care -She knows her new brain metastasis makes her cancer stage IV and carries a poor prognosis.  -goal of her treatment is palliative  -she is full code now    PLAN: -I refilled Marinol, ativan and Oxycodone today.  -will re-consult Dr. Hilma Favors for her pain control  -CT CAP reviewed, shows mediastinal node metastasis -Brain MRI on 08/25/19 scheduled and see Dr. Mickeal Skinner on 8/2 -port placement by IR in 2 weeks  -f/u in 2 weeks, to finalize her treatment plan    No problem-specific Assessment & Plan notes found for this encounter.   Orders Placed This Encounter  Procedures  . IR IMAGING GUIDED PORT INSERTION    Standing Status:   Future    Standing Expiration Date:   08/17/2020    Order Specific Question:   Reason for Exam (SYMPTOM  OR DIAGNOSIS REQUIRED)    Answer:   chemo    Order Specific Question:   Is the patient pregnant?    Answer:   No    Order Specific Question:   Preferred Imaging Location?    Answer:   [facility]    Order Specific Question:   Release to patient    Answer:   Immediate   All questions were answered. The patient knows to call the clinic with any problems, questions or concerns. No barriers to  learning was detected. The total time spent in the appointment was 40 minutes.     [name], MD 08/18/2019   I, Joslyn Devon, am acting as scribe for [name], MD.   I have reviewed the above documentation for accuracy and completeness, and I agree with the above.

## 2019-08-15 NOTE — Telephone Encounter (Signed)
I refilled her last oxycodone for 20 days supply on 7/7, and it's too early for refill unless she has new brain mets. Her commends is a red flag for narcotics abuse, I will include Dr. Hilma Favors here to see if she has any suggestions. She has appointment with me on 7/22.   [name] MD

## 2019-08-15 NOTE — Telephone Encounter (Signed)
Patient called stating she is having headaches with sharp shooting pain lasting for hours in a day and it has been happening 4-5 times a day.  Patient asked for pain medication.  I questioned if she was still taking the decadron 2mg  and if she started back the gabapentin 300/600 and she stated yes.  She also stated that she usually takes Oxycodone 3 times a day prescribed by Dr. Burr Medico.  She reports taking up to 7 times a day with this recent pain and states she only has two tablets left.  I explained the overuse of narcotics could be contributing to the worsening of headaches and she stated knowledge of this fact.  Explained I could get this information to Dr. Mickeal Skinner to see if he had any recommendations.  I advised that narcotics at this point were not due for refill because of incorrect usage.  She stated "she will just buy some off the street then".  I explained if the pain worsened she could always go to the emergency room for evaluation and pain control.  She also advised that she was due for MRI for tomorrow but it had to be rescheduled because of an insurance hold up.   Routed to MD to review and advise.

## 2019-08-16 ENCOUNTER — Ambulatory Visit (HOSPITAL_COMMUNITY): Payer: Medicaid Other

## 2019-08-18 ENCOUNTER — Inpatient Hospital Stay: Payer: Medicaid Other | Admitting: Internal Medicine

## 2019-08-18 ENCOUNTER — Encounter: Payer: Self-pay | Admitting: Hematology

## 2019-08-18 ENCOUNTER — Other Ambulatory Visit: Payer: Self-pay

## 2019-08-18 ENCOUNTER — Inpatient Hospital Stay: Payer: Medicaid Other

## 2019-08-18 ENCOUNTER — Inpatient Hospital Stay (HOSPITAL_BASED_OUTPATIENT_CLINIC_OR_DEPARTMENT_OTHER): Payer: Medicaid Other | Admitting: Hematology

## 2019-08-18 ENCOUNTER — Telehealth: Payer: Self-pay

## 2019-08-18 VITALS — BP 139/85 | HR 99 | Temp 97.8°F | Resp 18 | Ht 62.0 in | Wt 164.7 lb

## 2019-08-18 DIAGNOSIS — C3490 Malignant neoplasm of unspecified part of unspecified bronchus or lung: Secondary | ICD-10-CM

## 2019-08-18 DIAGNOSIS — J449 Chronic obstructive pulmonary disease, unspecified: Secondary | ICD-10-CM | POA: Diagnosis not present

## 2019-08-18 DIAGNOSIS — F329 Major depressive disorder, single episode, unspecified: Secondary | ICD-10-CM | POA: Diagnosis not present

## 2019-08-18 DIAGNOSIS — C3492 Malignant neoplasm of unspecified part of left bronchus or lung: Secondary | ICD-10-CM

## 2019-08-18 DIAGNOSIS — C7931 Secondary malignant neoplasm of brain: Secondary | ICD-10-CM

## 2019-08-18 DIAGNOSIS — Z7189 Other specified counseling: Secondary | ICD-10-CM | POA: Diagnosis not present

## 2019-08-18 DIAGNOSIS — F419 Anxiety disorder, unspecified: Secondary | ICD-10-CM | POA: Diagnosis not present

## 2019-08-18 DIAGNOSIS — C3412 Malignant neoplasm of upper lobe, left bronchus or lung: Secondary | ICD-10-CM | POA: Diagnosis not present

## 2019-08-18 DIAGNOSIS — F32A Depression, unspecified: Secondary | ICD-10-CM

## 2019-08-18 DIAGNOSIS — Z7952 Long term (current) use of systemic steroids: Secondary | ICD-10-CM | POA: Diagnosis not present

## 2019-08-18 DIAGNOSIS — Z79899 Other long term (current) drug therapy: Secondary | ICD-10-CM | POA: Diagnosis not present

## 2019-08-18 DIAGNOSIS — F17210 Nicotine dependence, cigarettes, uncomplicated: Secondary | ICD-10-CM | POA: Diagnosis not present

## 2019-08-18 DIAGNOSIS — Z923 Personal history of irradiation: Secondary | ICD-10-CM | POA: Diagnosis not present

## 2019-08-18 DIAGNOSIS — C771 Secondary and unspecified malignant neoplasm of intrathoracic lymph nodes: Secondary | ICD-10-CM | POA: Diagnosis not present

## 2019-08-18 DIAGNOSIS — I2510 Atherosclerotic heart disease of native coronary artery without angina pectoris: Secondary | ICD-10-CM | POA: Diagnosis not present

## 2019-08-18 DIAGNOSIS — D509 Iron deficiency anemia, unspecified: Secondary | ICD-10-CM | POA: Diagnosis not present

## 2019-08-18 LAB — CMP (CANCER CENTER ONLY)
ALT: 28 U/L (ref 0–44)
AST: 19 U/L (ref 15–41)
Albumin: 4.1 g/dL (ref 3.5–5.0)
Alkaline Phosphatase: 67 U/L (ref 38–126)
Anion gap: 11 (ref 5–15)
BUN: 29 mg/dL — ABNORMAL HIGH (ref 6–20)
CO2: 28 mmol/L (ref 22–32)
Calcium: 9.5 mg/dL (ref 8.9–10.3)
Chloride: 106 mmol/L (ref 98–111)
Creatinine: 1.23 mg/dL — ABNORMAL HIGH (ref 0.44–1.00)
GFR, Est AFR Am: 58 mL/min — ABNORMAL LOW (ref >60)
GFR, Estimated: 50 mL/min — ABNORMAL LOW (ref >60)
Glucose, Bld: 96 mg/dL (ref 70–99)
Potassium: 4.6 mmol/L (ref 3.5–5.1)
Sodium: 145 mmol/L (ref 135–145)
Total Bilirubin: <0.1 mg/dL — ABNORMAL LOW (ref 0.3–1.2)
Total Protein: 7.4 g/dL (ref 6.5–8.1)

## 2019-08-18 LAB — CBC WITH DIFFERENTIAL (CANCER CENTER ONLY)
Abs Immature Granulocytes: 0.07 10*3/uL (ref 0.00–0.07)
Basophils Absolute: 0.1 10*3/uL (ref 0.0–0.1)
Basophils Relative: 0 %
Eosinophils Absolute: 0 10*3/uL (ref 0.0–0.5)
Eosinophils Relative: 0 %
HCT: 40.7 % (ref 36.0–46.0)
Hemoglobin: 13.3 g/dL (ref 12.0–15.0)
Immature Granulocytes: 1 %
Lymphocytes Relative: 5 %
Lymphs Abs: 0.7 10*3/uL (ref 0.7–4.0)
MCH: 31.1 pg (ref 26.0–34.0)
MCHC: 32.7 g/dL (ref 30.0–36.0)
MCV: 95.3 fL (ref 80.0–100.0)
Monocytes Absolute: 0.7 10*3/uL (ref 0.1–1.0)
Monocytes Relative: 5 %
Neutro Abs: 10.7 10*3/uL — ABNORMAL HIGH (ref 1.7–7.7)
Neutrophils Relative %: 89 %
Platelet Count: 341 10*3/uL (ref 150–400)
RBC: 4.27 MIL/uL (ref 3.87–5.11)
RDW: 14.5 % (ref 11.5–15.5)
WBC Count: 12.2 10*3/uL — ABNORMAL HIGH (ref 4.0–10.5)
nRBC: 0 % (ref 0.0–0.2)

## 2019-08-18 MED ORDER — OXYCODONE HCL 10 MG PO TABS: 10.0000 mg | ORAL_TABLET | Freq: Four times a day (QID) | ORAL | 0 refills | Status: DC | PRN

## 2019-08-18 MED ORDER — DRONABINOL 5 MG PO CAPS
5.0000 mg | ORAL_CAPSULE | Freq: Two times a day (BID) | ORAL | 0 refills | Status: DC
Start: 2019-08-18 — End: 2019-11-16

## 2019-08-18 MED ORDER — LORAZEPAM 1 MG PO TABS: 1.0000 mg | ORAL_TABLET | Freq: Two times a day (BID) | ORAL | 0 refills | Status: DC | PRN

## 2019-08-18 NOTE — Telephone Encounter (Signed)
Marissa [name] cslled stating that her [facility] pharmacy does not have any oxycodone in stock and they are not going to order anymore.  She requests that oxycodone prescription be sent to [facility]. Forwarded to Dr. Burr Medico. I called [facility] and cancelled oxycodone prescription sent to them.

## 2019-08-18 NOTE — Progress Notes (Signed)
DISCONTINUE ON PATHWAY REGIMEN - Small Cell Lung     A cycle is every 21 days:     Etoposide      Cisplatin   **Always confirm dose/schedule in your pharmacy ordering system**  REASON: Other Reason PRIOR TREATMENT: LOS15: Cisplatin 75 mg/m2 D1 + Etoposide 100 mg/m2 IV D1-3 q21 Days x 4 Cycles with Concurrent Radiation TREATMENT RESPONSE: Partial Response (PR)  START ON PATHWAY REGIMEN - Small Cell Lung     Cycles 1 through 4, every 21 days:     Atezolizumab      Carboplatin      Etoposide    Cycles 5 and beyond, every 21 days:     Atezolizumab   **Always confirm dose/schedule in your pharmacy ordering system**  Patient Characteristics: Relapsed or Progressive Disease, Second Line, Relapse > 6 Months Therapeutic Status: Relapsed or Progressive Disease Line of Therapy: Second Line Time to Relapse: Relapse > 6 Months Intent of Therapy: Non-Curative / Palliative Intent, Discussed with Patient

## 2019-08-19 ENCOUNTER — Telehealth: Payer: Self-pay | Admitting: Hematology

## 2019-08-19 ENCOUNTER — Emergency Department (HOSPITAL_COMMUNITY): Payer: Medicaid Other

## 2019-08-19 ENCOUNTER — Emergency Department (HOSPITAL_COMMUNITY)
Admission: EM | Admit: 2019-08-19 | Discharge: 2019-08-19 | Disposition: A | Discharge status: Home / Self Care | Payer: Medicaid Other | Attending: Emergency Medicine | Admitting: Emergency Medicine

## 2019-08-19 ENCOUNTER — Encounter (HOSPITAL_COMMUNITY): Payer: Self-pay

## 2019-08-19 ENCOUNTER — Telehealth: Payer: Self-pay

## 2019-08-19 DIAGNOSIS — R0789 Other chest pain: Secondary | ICD-10-CM | POA: Diagnosis not present

## 2019-08-19 DIAGNOSIS — J45909 Unspecified asthma, uncomplicated: Secondary | ICD-10-CM | POA: Insufficient documentation

## 2019-08-19 DIAGNOSIS — Z20822 Contact with and (suspected) exposure to covid-19: Secondary | ICD-10-CM | POA: Diagnosis not present

## 2019-08-19 DIAGNOSIS — R0602 Shortness of breath: Secondary | ICD-10-CM | POA: Insufficient documentation

## 2019-08-19 DIAGNOSIS — R05 Cough: Secondary | ICD-10-CM | POA: Insufficient documentation

## 2019-08-19 DIAGNOSIS — F17210 Nicotine dependence, cigarettes, uncomplicated: Secondary | ICD-10-CM | POA: Insufficient documentation

## 2019-08-19 DIAGNOSIS — R079 Chest pain, unspecified: Secondary | ICD-10-CM

## 2019-08-19 DIAGNOSIS — J449 Chronic obstructive pulmonary disease, unspecified: Secondary | ICD-10-CM | POA: Diagnosis not present

## 2019-08-19 LAB — BASIC METABOLIC PANEL
Anion gap: 11 (ref 5–15)
BUN: 28 mg/dL — ABNORMAL HIGH (ref 6–20)
CO2: 22 mmol/L (ref 22–32)
Calcium: 9.7 mg/dL (ref 8.9–10.3)
Chloride: 109 mmol/L (ref 98–111)
Creatinine, Ser: 0.75 mg/dL (ref 0.44–1.00)
GFR calc Af Amer: >60 mL/min (ref >60)
GFR calc non Af Amer: >60 mL/min (ref >60)
Glucose, Bld: 120 mg/dL — ABNORMAL HIGH (ref 70–99)
Potassium: 4 mmol/L (ref 3.5–5.1)
Sodium: 142 mmol/L (ref 135–145)

## 2019-08-19 LAB — CBC WITH DIFFERENTIAL/PLATELET
Abs Immature Granulocytes: 0.07 10*3/uL (ref 0.00–0.07)
Basophils Absolute: 0 10*3/uL (ref 0.0–0.1)
Basophils Relative: 0 %
Eosinophils Absolute: 0 10*3/uL (ref 0.0–0.5)
Eosinophils Relative: 0 %
HCT: 43.4 % (ref 36.0–46.0)
Hemoglobin: 13.6 g/dL (ref 12.0–15.0)
Immature Granulocytes: 1 %
Lymphocytes Relative: 6 %
Lymphs Abs: 0.5 10*3/uL — ABNORMAL LOW (ref 0.7–4.0)
MCH: 30.6 pg (ref 26.0–34.0)
MCHC: 31.3 g/dL (ref 30.0–36.0)
MCV: 97.7 fL (ref 80.0–100.0)
Monocytes Absolute: 0.4 10*3/uL (ref 0.1–1.0)
Monocytes Relative: 4 %
Neutro Abs: 8.3 10*3/uL — ABNORMAL HIGH (ref 1.7–7.7)
Neutrophils Relative %: 89 %
Platelets: 328 10*3/uL (ref 150–400)
RBC: 4.44 MIL/uL (ref 3.87–5.11)
RDW: 14.6 % (ref 11.5–15.5)
WBC: 9.3 10*3/uL (ref 4.0–10.5)
nRBC: 0 % (ref 0.0–0.2)

## 2019-08-19 LAB — SARS CORONAVIRUS 2 BY RT PCR (HOSPITAL ORDER, PERFORMED IN ~~LOC~~ HOSPITAL LAB): SARS Coronavirus 2: NEGATIVE

## 2019-08-19 LAB — TROPONIN I (HIGH SENSITIVITY)
Troponin I (High Sensitivity): 3 ng/L (ref <18)
Troponin I (High Sensitivity): 3 ng/L (ref <18)

## 2019-08-19 MED ORDER — MORPHINE SULFATE (PF) 4 MG/ML IV SOLN
4.0000 mg | Freq: Once | INTRAVENOUS | Status: AC
  Administered 2019-08-19: 4 mg via INTRAVENOUS
  Filled 2019-08-19: qty 1

## 2019-08-19 MED ORDER — IOHEXOL 350 MG/ML SOLN
100.0000 mL | Freq: Once | INTRAVENOUS | Status: AC | PRN
  Administered 2019-08-19: 100 mL via INTRAVENOUS

## 2019-08-19 MED ORDER — KETOROLAC TROMETHAMINE 15 MG/ML IJ SOLN
15.0000 mg | Freq: Once | INTRAMUSCULAR | Status: AC
  Administered 2019-08-19: 15 mg via INTRAVENOUS
  Filled 2019-08-19: qty 1

## 2019-08-19 MED ORDER — SODIUM CHLORIDE (PF) 0.9 % IJ SOLN
INTRAMUSCULAR | Status: AC
  Filled 2019-08-19: qty 50

## 2019-08-19 MED ORDER — HYDROMORPHONE HCL 1 MG/ML IJ SOLN
1.0000 mg | Freq: Once | INTRAMUSCULAR | Status: AC
  Administered 2019-08-19: 1 mg via INTRAVENOUS
  Filled 2019-08-19: qty 1

## 2019-08-19 MED ORDER — LEVOFLOXACIN 500 MG PO TABS
500.0000 mg | ORAL_TABLET | Freq: Once | ORAL | Status: AC
  Administered 2019-08-19: 500 mg via ORAL
  Filled 2019-08-19: qty 1

## 2019-08-19 MED FILL — oxyCODONE HCL 10 MG TABS: 10 | 23 days supply | Qty: 90 | Fill #0

## 2019-08-19 NOTE — Telephone Encounter (Signed)
Insurance authorization for oxycodone and marinol received today.  I left vm for patient relaying this information.

## 2019-08-19 NOTE — ED Notes (Signed)
Pt transported to xray 

## 2019-08-19 NOTE — Telephone Encounter (Signed)
Scheduled appt per 7/22 sch msg - left message for patient with appt date and time

## 2019-08-19 NOTE — ED Triage Notes (Signed)
Per EMS patient reports to the ER for c/o SOB. Patient has a hx of lung cancer with mets to the brain. Patient has small cell carcinoma.

## 2019-08-22 NOTE — ED Provider Notes (Signed)
[facility] DEPT Provider Note   CSN: [id number] Arrival date & time: 08/19/19  1527     History Chief Complaint  Patient presents with  . Lung Cancer  . Shortness of Breath    Marissa [name] is a 54 y.o. female.  HPI   54 year old female with chest pain or shortness of breath.  Worse over the last couple days.  No fevers or chills.  Occasional nonproductive cough.  No unusual leg pain or swelling.  He has a history of metastatic lung cancer.  The symptoms are new within the last few days though.  Past Medical History:  Diagnosis Date  . Asthma   . Chronic headaches   . COPD (chronic obstructive pulmonary disease) ([place])   . Depression   . Heart murmur   . Small cell lung cancer [facility])     Patient Active Problem List   Diagnosis Date Noted  . Goals of care, counseling/discussion 08/18/2019  . Brain metastases ([place]) 02/21/2019  . Drug-induced nausea and vomiting 10/08/2018  . Pleural effusion on left 09/13/2018  . Small cell lung cancer, left upper lobe ([place])   . Pneumothorax 08/24/2018  . Pneumothorax after biopsy 08/24/2018  . Lymphadenopathy, hilar 08/20/2018  . Nodule of left lung 08/20/2018  . Mediastinal lymphadenopathy 08/20/2018  . Respiratory failure with hypoxia ([place]) 07/29/2018  . Chronic daily headache 07/29/2018  . Microcytic anemia 07/29/2018  . Tobacco abuse 07/29/2018    Past Surgical History:  Procedure Laterality Date  . BRONCHIAL BIOPSY  08/24/2018   Procedure: BRONCHIAL BIOPSIES;  Surgeon: Margaretha Seeds, MD;  Location: [facility];  Service: Cardiopulmonary;;  . BRONCHIAL BRUSHINGS  08/24/2018   Procedure: BRONCHIAL BRUSHINGS;  Surgeon: Margaretha Seeds, MD;  Location: [facility];  Service: Cardiopulmonary;;  . BRONCHIAL NEEDLE ASPIRATION BIOPSY  08/24/2018   Procedure: BRONCHIAL NEEDLE ASPIRATION BIOPSIES;  Surgeon: Margaretha Seeds, MD;  Location: [facility];  Service: Cardiopulmonary;;  . ENDOBRONCHIAL  ULTRASOUND N/A 08/24/2018   Procedure: ENDOBRONCHIAL ULTRASOUND;  Surgeon: Margaretha Seeds, MD;  Location: [facility];  Service: Cardiopulmonary;  Laterality: N/A;  . IR THORACENTESIS ASP PLEURAL SPACE W/IMG GUIDE  09/14/2018  . TUBAL LIGATION    . VIDEO BRONCHOSCOPY  08/24/2018   Procedure: VIDEO BRONCHOSCOPY;  Surgeon: Margaretha Seeds, MD;  Location: [facility];  Service: Cardiopulmonary;;     OB History   No obstetric history on file.     Family History  Problem Relation Age of Onset  . Heart disease Mother   . Dementia Father     Social History   Tobacco Use  . Smoking status: Current Every Day Smoker    Packs/day: 2.00    Years: 39.00    Pack years: 78.00    Types: Cigarettes    Start date: 06/22/1979  . Smokeless tobacco: Never Used  Vaping Use  . Vaping Use: Never used  Substance Use Topics  . Alcohol use: Yes    Comment: occasional  . Drug use: Not Currently    Types: Marijuana    Home Medications Prior to Admission medications   Medication Sig Start Date End Date Taking? Authorizing Provider  dexamethasone (DECADRON) 2 MG tablet Take 1 tablet (2 mg total) by mouth daily. 07/29/19  Yes Vaslow, Acey Lav, MD  diazepam (VALIUM) 10 MG tablet Take 1 tablet po 30 minutes prior to MRI or radiation 08/02/19  Yes Vaslow, Acey Lav, MD  gabapentin (NEURONTIN) 300 MG capsule Take 1 tab in morning and  2 tabs at bedtime 07/29/19  Yes Vaslow, Acey Lav, MD  LORazepam (ATIVAN) 1 MG tablet Take 1 tablet (1 mg total) by mouth every 12 (twelve) hours as needed for anxiety. 08/18/19  Yes [name], MD  prochlorperazine (COMPAZINE) 10 MG tablet Take 10 mg by mouth every 6 (six) hours as needed for nausea or vomiting.   Yes [provider]  promethazine (PHENERGAN) 25 MG tablet Take 1 tablet (25 mg total) by mouth every 6 (six) hours as needed for nausea or vomiting. 07/06/19  Yes [name], MD  baclofen (LIORESAL) 10 MG tablet Take 1 tablet (10 mg total) by mouth 3 (three)  times daily as needed for muscle spasms. Patient not taking: Reported on 08/19/2019 02/21/19   Alla Feeling, NP  dronabinol (MARINOL) 2.5 MG capsule Take 1 capsule (2.5 mg total) by mouth 2 (two) times daily before a meal. Patient not taking: Reported on 08/19/2019 05/19/19   [name], MD  dronabinol (MARINOL) 5 MG capsule Take 1 capsule (5 mg total) by mouth 2 (two) times daily before a meal. 08/18/19   [name], MD  DULoxetine (CYMBALTA) 20 MG capsule Take 2 capsules (40 mg total) by mouth daily. Take 1 capsule by mouth daily for the first week, then increase to 2 caps daily Patient not taking: Reported on 08/19/2019 02/17/19   Alla Feeling, NP  Oxycodone HCl 10 MG TABS Take 1 tablet (10 mg total) by mouth every 6 (six) hours as needed. 08/18/19   [name], MD    Allergies    Penicillins  Review of Systems   Review of Systems All systems reviewed and negative, other than as noted in HPI.  Physical Exam Updated Vital Signs BP 124/66   Pulse 85   Temp 98 F (36.7 C) (Oral)   Resp 23   LMP 04/24/2012   SpO2 96%   Physical Exam Vitals and nursing note reviewed.  Constitutional:      General: She is not in acute distress.    Appearance: She is well-developed.  HENT:     Head: Normocephalic and atraumatic.  Eyes:     General:        Right eye: No discharge.        Left eye: No discharge.     Conjunctiva/sclera: Conjunctivae normal.  Cardiovascular:     Rate and Rhythm: Normal rate and regular rhythm.     Heart sounds: Normal heart sounds. No murmur heard.  No friction rub. No gallop.   Pulmonary:     Effort: Pulmonary effort is normal. No respiratory distress.     Breath sounds: Normal breath sounds.  Abdominal:     General: There is no distension.     Palpations: Abdomen is soft.     Tenderness: There is no abdominal tenderness.  Musculoskeletal:        General: No tenderness.     Cervical back: Neck supple.  Skin:    General: Skin is warm and dry.  Neurological:      Mental Status: She is alert.  Psychiatric:        Behavior: Behavior normal.        Thought Content: Thought content normal.     ED Results / Procedures / Treatments   Labs (all labs ordered are listed, but only abnormal results are displayed) Labs Reviewed  CBC WITH DIFFERENTIAL/PLATELET - Abnormal; Notable for the following components:      Result Value   Neutro Abs 8.3 (*)  Lymphs Abs 0.5 (*)    All other components within normal limits  BASIC METABOLIC PANEL - Abnormal; Notable for the following components:   Glucose, Bld 120 (*)    BUN 28 (*)    All other components within normal limits  SARS CORONAVIRUS 2 BY RT PCR (HOSPITAL ORDER, [facility])  TROPONIN I (HIGH SENSITIVITY)  TROPONIN I (HIGH SENSITIVITY)    EKG None  Radiology No results found.   DG Chest 2 View  Result Date: 08/19/2019 CLINICAL DATA:  Small-cell lung cancer, chest pain EXAM: CHEST - 2 VIEW COMPARISON:  None. FINDINGS: Lungs are clear. No pneumothorax or pleural effusion. Cardiac size within normal limits. Pulmonary vascularity is normal. 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 No acute bone abnormality. IMPRESSION: No active cardiopulmonary disease. Electronically Signed   By: Fidela Salisbury MD   On: 08/19/2019 17:07   CT Chest W Contrast  Result Date: 08/10/2019 CLINICAL DATA:  Small-cell lung cancer diagnosed 7/20. Chemotherapy and radiation therapy complete. Chest pain. Mid and lower back pain. Cough. Shortness of breath. Nausea and vomiting. EXAM: CT CHEST, ABDOMEN, AND PELVIS WITH CONTRAST TECHNIQUE: Multidetector CT imaging of the chest, abdomen and pelvis was performed following the standard protocol during bolus administration of intravenous contrast. CONTRAST:  133mL OMNIPAQUE IOHEXOL 300 MG/ML  SOLN COMPARISON:  05/17/2019 FINDINGS: CT CHEST FINDINGS Cardiovascular: Aortic and branch vessel atherosclerosis. Mild cardiomegaly,  without pericardial effusion. No central pulmonary embolism, on this non-dedicated study. Mediastinum/Nodes: A left low jugular/supraclavicular node measures 7 mm on 02/02 and is new. New or significantly enlarged right paratracheal node of 7 mm on 16/2. Centrally necrotic AP window node measures 1.3 cm on 22/2 versus 9 mm on the prior No hilar adenopathy. Lungs/Pleura: No pleural fluid. Developing subsegmental atelectasis at the right lung base laterally. Paraseptal emphysema. Slight decrease in trace left apical pleural thickening including on coronal image 105. Similar soft tissue thickening along the lateral aspect of the AP window, including on 20/2. This dependent posterior left upper lobe opacity is similar on 43/7, favored to represent subsegmental atelectasis. Example at 6 mm. Ovoid soft tissue thickening along the left major fissure is similar on 56/7. Musculoskeletal: No acute osseous abnormality. CT ABDOMEN PELVIS FINDINGS Hepatobiliary: Normal liver. Normal gallbladder, without biliary ductal dilatation. Pancreas: Normal, without mass or ductal dilatation. Spleen: Normal in size, without focal abnormality. Adrenals/Urinary Tract: Left adrenal nodule of 2.5 cm is similar in size, likely an adenoma based on washout characteristics. Normal kidneys, without hydronephrosis. Normal urinary bladder. Stomach/Bowel: Normal stomach, without wall thickening. Normal colon, appendix, and terminal ileum. Normal small bowel. Vascular/Lymphatic: Aortic atherosclerosis. No abdominopelvic adenopathy. Reproductive: Normal uterus and adnexa. Other: No significant free fluid. No evidence of omental or peritoneal disease. Musculoskeletal: No acute osseous abnormality. Mild S shaped thoracolumbar spine curvature. IMPRESSION: 1. New and progressive adenopathy within the mediastinum and low left neck, consistent with nodal metastasis. 2. No evidence of pulmonary metastasis. Similar areas of probable scarring and pleural  thickening. 3. No subdiaphragmatic metastatic disease identified. 4. Left adrenal adenoma. 5. Age advanced aortic atherosclerosis (ICD10-I70.0) and emphysema (ICD10-J43.9). Electronically Signed   By: Abigail Miyamoto M.D.   On: 08/10/2019 16:11   CT Angio Chest PE W and/or Wo Contrast  Result Date: 08/19/2019 CLINICAL DATA:  History of metastatic small cell lung cancer, shortness of breath EXAM: CT ANGIOGRAPHY CHEST WITH CONTRAST TECHNIQUE: Multidetector CT imaging of the chest was performed using the standard  protocol during bolus administration of intravenous contrast. Multiplanar CT image reconstructions and MIPs were obtained to evaluate the vascular anatomy. CONTRAST:  115mL OMNIPAQUE IOHEXOL 350 MG/ML SOLN COMPARISON:  08/10/2019 FINDINGS: Cardiovascular: This is a technically adequate evaluation of the pulmonary vasculature. No filling defects or pulmonary emboli. The heart is unremarkable without pericardial effusion. No evidence of thoracic aortic aneurysm or dissection. Stable mild atherosclerosis. Mediastinum/Nodes: The left internal jugular chain lymph node seen on previous study is only partially visualized on this exam, measuring 8 mm in short axis. 13 mm short axis AP window lymph node image 43/4 unchanged. 7 mm right paratracheal lymph node image 29/4 unchanged. No new adenopathy. Thyroid, trachea, and esophagus are unremarkable. Lungs/Pleura: Background emphysema is again noted. There is interlobular septal thickening, with new ground-glass airspace disease seen within the right upper and left lower lobes. Findings could reflect mild edema versus atypical infection. No effusion or pneumothorax. The central airways are patent. Stable ovoid soft tissue within the left major fissure. Upper Abdomen: Stable 3 cm left adrenal nodule. The remainder of the upper abdomen is unremarkable. Musculoskeletal: No acute or destructive bony lesions. Reconstructed images demonstrate no additional findings. Review of  the MIP images confirms the above findings. IMPRESSION: 1. No evidence of pulmonary embolus. 2. Stable nodal metastasis within the mediastinum and left internal jugular chain. 3. Interval development of mild interlobular septal thickening and ground-glass opacity, greatest in the right upper lobe and left lower lobe. Differential includes asymmetric edema versus atypical infection. 4. Aortic Atherosclerosis (ICD10-I70.0) and Emphysema (ICD10-J43.9). Electronically Signed   By: Randa Ngo M.D.   On: 08/19/2019 19:35   CT Abdomen Pelvis W Contrast  Result Date: 08/10/2019 CLINICAL DATA:  Small-cell lung cancer diagnosed 7/20. Chemotherapy and radiation therapy complete. Chest pain. Mid and lower back pain. Cough. Shortness of breath. Nausea and vomiting. EXAM: CT CHEST, ABDOMEN, AND PELVIS WITH CONTRAST TECHNIQUE: Multidetector CT imaging of the chest, abdomen and pelvis was performed following the standard protocol during bolus administration of intravenous contrast. CONTRAST:  157mL OMNIPAQUE IOHEXOL 300 MG/ML  SOLN COMPARISON:  05/17/2019 FINDINGS: CT CHEST FINDINGS Cardiovascular: Aortic and branch vessel atherosclerosis. Mild cardiomegaly, without pericardial effusion. No central pulmonary embolism, on this non-dedicated study. Mediastinum/Nodes: A left low jugular/supraclavicular node measures 7 mm on 02/02 and is new. New or significantly enlarged right paratracheal node of 7 mm on 16/2. Centrally necrotic AP window node measures 1.3 cm on 22/2 versus 9 mm on the prior No hilar adenopathy. Lungs/Pleura: No pleural fluid. Developing subsegmental atelectasis at the right lung base laterally. Paraseptal emphysema. Slight decrease in trace left apical pleural thickening including on coronal image 105. Similar soft tissue thickening along the lateral aspect of the AP window, including on 20/2. This dependent posterior left upper lobe opacity is similar on 43/7, favored to represent subsegmental atelectasis.  Example at 6 mm. Ovoid soft tissue thickening along the left major fissure is similar on 56/7. Musculoskeletal: No acute osseous abnormality. CT ABDOMEN PELVIS FINDINGS Hepatobiliary: Normal liver. Normal gallbladder, without biliary ductal dilatation. Pancreas: Normal, without mass or ductal dilatation. Spleen: Normal in size, without focal abnormality. Adrenals/Urinary Tract: Left adrenal nodule of 2.5 cm is similar in size, likely an adenoma based on washout characteristics. Normal kidneys, without hydronephrosis. Normal urinary bladder. Stomach/Bowel: Normal stomach, without wall thickening. Normal colon, appendix, and terminal ileum. Normal small bowel. Vascular/Lymphatic: Aortic atherosclerosis. No abdominopelvic adenopathy. Reproductive: Normal uterus and adnexa. Other: No significant free fluid. No evidence of omental or peritoneal disease.  Musculoskeletal: No acute osseous abnormality. Mild S shaped thoracolumbar spine curvature. IMPRESSION: 1. New and progressive adenopathy within the mediastinum and low left neck, consistent with nodal metastasis. 2. No evidence of pulmonary metastasis. Similar areas of probable scarring and pleural thickening. 3. No subdiaphragmatic metastatic disease identified. 4. Left adrenal adenoma. 5. Age advanced aortic atherosclerosis (ICD10-I70.0) and emphysema (ICD10-J43.9). Electronically Signed   By: Abigail Miyamoto M.D.   On: 08/10/2019 16:11    Procedures Procedures (including critical care time)  Medications Ordered in ED Medications  HYDROmorphone (DILAUDID) injection 1 mg (1 mg Intravenous Given 08/19/19 1651)  ketorolac (TORADOL) 15 MG/ML injection 15 mg (15 mg Intravenous Given 08/19/19 1651)  iohexol (OMNIPAQUE) 350 MG/ML injection 100 mL (100 mLs Intravenous Contrast Given 08/19/19 1918)  morphine 4 MG/ML injection 4 mg (4 mg Intravenous Given 08/19/19 2030)  levofloxacin (LEVAQUIN) tablet 500 mg (500 mg Oral Given 08/19/19 2103)    ED Course  I have reviewed  the triage vital signs and the nursing notes.  Pertinent labs & imaging results that were available during my care of the patient were reviewed by me and considered in my medical decision making (see chart for details).    MDM Rules/Calculators/A&P                          54 year old female with chest pain.  Atypical for ACS.  CTA without evidence of pulmonary embolism.  Still evidence of prior noted adenopathy.  Questionable focal edema versus infection.  Not clinically convinced, but because of symptoms will place on Levaquin.  If this is an infectious process, I think she is appropriate outpatient treatment at this time.  Return precautions were discussed.  Close outpatient follow-up otherwise. Final Clinical Impression(s) / ED Diagnoses Final diagnoses:  Chest pain, unspecified type    Rx / DC Orders ED Discharge Orders    None       Virgel Manifold, MD 08/22/19 2348

## 2019-08-23 ENCOUNTER — Telehealth: Payer: Self-pay | Admitting: Radiation Therapy

## 2019-08-23 NOTE — Telephone Encounter (Signed)
Left a detailed voicemail informing patient of a telephone visit with Dr. Hilma Favors scheduled on Friday 8/6 @ 1:00. I stated several times that this is not an in person visit, rather Dr. Hilma Favors will call her to discuss her current pain medication management.   Mont Dutton R.T.(R)(T) Radiation Special Procedures Navigator

## 2019-08-25 ENCOUNTER — Ambulatory Visit (HOSPITAL_COMMUNITY)
Admission: RE | Admit: 2019-08-25 | Discharge: 2019-08-25 | Disposition: A | Discharge status: Home / Self Care | Payer: Medicaid Other | Source: Ambulatory Visit | Attending: Internal Medicine | Admitting: Internal Medicine

## 2019-08-25 ENCOUNTER — Other Ambulatory Visit: Payer: Self-pay

## 2019-08-25 DIAGNOSIS — C7931 Secondary malignant neoplasm of brain: Secondary | ICD-10-CM | POA: Diagnosis not present

## 2019-08-25 MED ORDER — GADOBUTROL 1 MMOL/ML IV SOLN
7.0000 mL | Freq: Once | INTRAVENOUS | Status: AC | PRN
  Administered 2019-08-25: 7 mL via INTRAVENOUS

## 2019-08-29 ENCOUNTER — Telehealth: Payer: Self-pay | Admitting: Internal Medicine

## 2019-08-29 ENCOUNTER — Inpatient Hospital Stay: Payer: Medicaid Other | Attending: Hematology

## 2019-08-29 ENCOUNTER — Inpatient Hospital Stay: Payer: Medicaid Other | Admitting: Internal Medicine

## 2019-08-29 DIAGNOSIS — Z923 Personal history of irradiation: Secondary | ICD-10-CM | POA: Insufficient documentation

## 2019-08-29 DIAGNOSIS — C771 Secondary and unspecified malignant neoplasm of intrathoracic lymph nodes: Secondary | ICD-10-CM | POA: Insufficient documentation

## 2019-08-29 DIAGNOSIS — D3502 Benign neoplasm of left adrenal gland: Secondary | ICD-10-CM | POA: Insufficient documentation

## 2019-08-29 DIAGNOSIS — C3412 Malignant neoplasm of upper lobe, left bronchus or lung: Secondary | ICD-10-CM | POA: Insufficient documentation

## 2019-08-29 DIAGNOSIS — Z79899 Other long term (current) drug therapy: Secondary | ICD-10-CM | POA: Insufficient documentation

## 2019-08-29 DIAGNOSIS — C7931 Secondary malignant neoplasm of brain: Secondary | ICD-10-CM | POA: Insufficient documentation

## 2019-08-29 DIAGNOSIS — Z9221 Personal history of antineoplastic chemotherapy: Secondary | ICD-10-CM | POA: Insufficient documentation

## 2019-08-29 DIAGNOSIS — F17210 Nicotine dependence, cigarettes, uncomplicated: Secondary | ICD-10-CM | POA: Insufficient documentation

## 2019-08-29 DIAGNOSIS — I700 Atherosclerosis of aorta: Secondary | ICD-10-CM | POA: Insufficient documentation

## 2019-08-29 NOTE — Telephone Encounter (Signed)
R/s appt per 8/2 sch msg - unable to reach pt . Left message for patient with appt date and time

## 2019-08-30 NOTE — Progress Notes (Signed)
Pharmacist Chemotherapy Monitoring - Initial Assessment    Anticipated start date: 09/05/19  Regimen:  . Are orders appropriate based on the patient's diagnosis, regimen, and cycle? Yes . Does the plan date match the patient's scheduled date? Yes . Is the sequencing of drugs appropriate? Yes . Are the premedications appropriate for the patient's regimen? Yes . Prior Authorization for treatment is: Approved o If applicable, is the correct biosimilar selected based on the patient's insurance? not applicable  Organ Function and Labs: Marland Kitchen Are dose adjustments needed based on the patient's renal function, hepatic function, or hematologic function? Yes . Are appropriate labs ordered prior to the start of patient's treatment? Yes . Other organ system assessment, if indicated: N/A . The following baseline labs, if indicated, have been ordered: atezolizumab: baseline TSH +/- T4  Dose Assessment: . Are the drug doses appropriate? Yes . Are the following correct: o Drug concentrations Yes o IV fluid compatible with drug Yes o Administration routes Yes o Timing of therapy Yes . If applicable, does the patient have documented access for treatment and/or plans for port-a-cath placement? yes . If applicable, have lifetime cumulative doses been properly documented and assessed? not applicable Lifetime Dose Tracking  No doses have been documented on this patient for the following tracked chemicals: Doxorubicin, Epirubicin, Idarubicin, Daunorubicin, Mitoxantrone, Bleomycin, Oxaliplatin, Carboplatin, Liposomal Doxorubicin  o   Toxicity Monitoring/Prevention: . The patient has the following take home antiemetics prescribed: Prochlorperazine . The patient has the following take home medications prescribed: N/A . Medication allergies and previous infusion related reactions, if applicable, have been reviewed and addressed. Yes . The patient's current medication list has been assessed for drug-drug  interactions with their chemotherapy regimen. no significant drug-drug interactions were identified on review.  Order Review: . Are the treatment plan orders signed? No . Is the patient scheduled to see a provider prior to their treatment? Yes  I verify that I have reviewed each item in the above checklist and answered each question accordingly.  Marissa [name] 08/30/2019 2:45 PM

## 2019-08-31 ENCOUNTER — Other Ambulatory Visit: Payer: Self-pay | Admitting: Radiology

## 2019-09-01 ENCOUNTER — Inpatient Hospital Stay: Payer: Medicaid Other | Admitting: Internal Medicine

## 2019-09-01 ENCOUNTER — Ambulatory Visit (HOSPITAL_COMMUNITY)
Admission: RE | Admit: 2019-09-01 | Discharge: 2019-09-01 | Disposition: A | Discharge status: Home / Self Care | Payer: Medicaid Other | Source: Ambulatory Visit | Attending: Hematology | Admitting: Hematology

## 2019-09-01 ENCOUNTER — Inpatient Hospital Stay: Payer: Medicaid Other | Admitting: Hematology

## 2019-09-01 ENCOUNTER — Other Ambulatory Visit: Payer: Self-pay

## 2019-09-01 ENCOUNTER — Encounter (HOSPITAL_COMMUNITY): Payer: Self-pay

## 2019-09-01 ENCOUNTER — Ambulatory Visit (HOSPITAL_COMMUNITY)
Admission: RE | Admit: 2019-09-01 | Discharge: 2019-09-01 | Disposition: A | Discharge status: Home / Self Care | Payer: Medicaid Other | Source: Ambulatory Visit | Attending: Internal Medicine | Admitting: Internal Medicine

## 2019-09-01 DIAGNOSIS — Z9221 Personal history of antineoplastic chemotherapy: Secondary | ICD-10-CM | POA: Diagnosis not present

## 2019-09-01 DIAGNOSIS — C3412 Malignant neoplasm of upper lobe, left bronchus or lung: Secondary | ICD-10-CM

## 2019-09-01 DIAGNOSIS — C3490 Malignant neoplasm of unspecified part of unspecified bronchus or lung: Secondary | ICD-10-CM | POA: Diagnosis present

## 2019-09-01 DIAGNOSIS — Z923 Personal history of irradiation: Secondary | ICD-10-CM | POA: Diagnosis not present

## 2019-09-01 DIAGNOSIS — Z79899 Other long term (current) drug therapy: Secondary | ICD-10-CM | POA: Diagnosis not present

## 2019-09-01 DIAGNOSIS — F17210 Nicotine dependence, cigarettes, uncomplicated: Secondary | ICD-10-CM | POA: Diagnosis not present

## 2019-09-01 DIAGNOSIS — F329 Major depressive disorder, single episode, unspecified: Secondary | ICD-10-CM | POA: Diagnosis not present

## 2019-09-01 DIAGNOSIS — C7931 Secondary malignant neoplasm of brain: Secondary | ICD-10-CM | POA: Insufficient documentation

## 2019-09-01 DIAGNOSIS — J449 Chronic obstructive pulmonary disease, unspecified: Secondary | ICD-10-CM | POA: Diagnosis not present

## 2019-09-01 HISTORY — DX: Dyspnea, unspecified: R06.00

## 2019-09-01 HISTORY — PX: IR IMAGING GUIDED PORT INSERTION: IMG5740

## 2019-09-01 LAB — PROTIME-INR
INR: 0.9 (ref 0.8–1.2)
Prothrombin Time: 11.8 seconds (ref 11.4–15.2)

## 2019-09-01 LAB — CBC WITH DIFFERENTIAL/PLATELET
Abs Immature Granulocytes: 0.03 10*3/uL (ref 0.00–0.07)
Basophils Absolute: 0.1 10*3/uL (ref 0.0–0.1)
Basophils Relative: 1 %
Eosinophils Absolute: 0.1 10*3/uL (ref 0.0–0.5)
Eosinophils Relative: 1 %
HCT: 44.7 % (ref 36.0–46.0)
Hemoglobin: 14.3 g/dL (ref 12.0–15.0)
Immature Granulocytes: 0 %
Lymphocytes Relative: 10 %
Lymphs Abs: 0.7 10*3/uL (ref 0.7–4.0)
MCH: 30.9 pg (ref 26.0–34.0)
MCHC: 32 g/dL (ref 30.0–36.0)
MCV: 96.5 fL (ref 80.0–100.0)
Monocytes Absolute: 0.6 10*3/uL (ref 0.1–1.0)
Monocytes Relative: 8 %
Neutro Abs: 5.5 10*3/uL (ref 1.7–7.7)
Neutrophils Relative %: 80 %
Platelets: 300 10*3/uL (ref 150–400)
RBC: 4.63 MIL/uL (ref 3.87–5.11)
RDW: 14.6 % (ref 11.5–15.5)
WBC: 6.9 10*3/uL (ref 4.0–10.5)
nRBC: 0 % (ref 0.0–0.2)

## 2019-09-01 MED ORDER — CLINDAMYCIN PHOSPHATE 900 MG/50ML IV SOLN
INTRAVENOUS | Status: AC
  Filled 2019-09-01: qty 50

## 2019-09-01 MED ORDER — LIDOCAINE-EPINEPHRINE (PF) 2 %-1:200000 IJ SOLN
INTRAMUSCULAR | Status: AC
  Filled 2019-09-01: qty 20

## 2019-09-01 MED ORDER — MIDAZOLAM HCL 2 MG/2ML IJ SOLN
INTRAMUSCULAR | Status: AC | PRN
  Administered 2019-09-01 (×4): 1 mg via INTRAVENOUS

## 2019-09-01 MED ORDER — LIDOCAINE-EPINEPHRINE 1 %-1:100000 IJ SOLN
INTRAMUSCULAR | Status: AC | PRN
  Administered 2019-09-01 (×3): 10 mL via INTRADERMAL

## 2019-09-01 MED ORDER — MIDAZOLAM HCL 2 MG/2ML IJ SOLN
INTRAMUSCULAR | Status: AC
  Filled 2019-09-01: qty 2

## 2019-09-01 MED ORDER — HEPARIN SOD (PORK) LOCK FLUSH 100 UNIT/ML IV SOLN
INTRAVENOUS | Status: AC
  Filled 2019-09-01: qty 5

## 2019-09-01 MED ORDER — SODIUM CHLORIDE 0.9 % IV SOLN: INTRAVENOUS | Status: DC

## 2019-09-01 MED ORDER — FENTANYL CITRATE (PF) 100 MCG/2ML IJ SOLN
INTRAMUSCULAR | Status: AC
  Filled 2019-09-01: qty 2

## 2019-09-01 MED ORDER — FENTANYL CITRATE (PF) 100 MCG/2ML IJ SOLN
INTRAMUSCULAR | Status: AC | PRN
  Administered 2019-09-01 (×2): 50 ug via INTRAVENOUS

## 2019-09-01 MED ORDER — HEPARIN SOD (PORK) LOCK FLUSH 100 UNIT/ML IV SOLN
INTRAVENOUS | Status: AC | PRN
  Administered 2019-09-01: 500 [IU] via INTRAVENOUS

## 2019-09-01 MED ORDER — LIDOCAINE-EPINEPHRINE 1 %-1:100000 IJ SOLN
INTRAMUSCULAR | Status: AC
  Filled 2019-09-01: qty 1

## 2019-09-01 MED ORDER — CLINDAMYCIN PHOSPHATE 900 MG/50ML IV SOLN
900.0000 mg | Freq: Once | INTRAVENOUS | Status: AC
  Administered 2019-09-01: 900 mg via INTRAVENOUS

## 2019-09-01 NOTE — Discharge Instructions (Signed)
Urgent needs - IR on call MD [phone]  Wound - May remove dressing and shower in 24 to 48 hours.  Keep site clean and dry.  Replace with bandaid. Do not submerge in tub or water until site healing well.  If ordered by your provider, may start Emla cream in 2 weeks or after incision is healed.  After completion of treatment, your provider should have you set up for monthly port flushes.    Implanted Port Insertion, Care After This sheet gives you information about how to care for yourself after your procedure. Your health care provider may also give you more specific instructions. If you have problems or questions, contact your health care provider. What can I expect after the procedure? After the procedure, it is common to have:  Discomfort at the port insertion site.  Bruising on the skin over the port. This should improve over 3-4 days. Follow these instructions at home: Port care  After your port is placed, you will get a manufacturer's information card. The card has information about your port. Keep this card with you at all times.  Take care of the port as told by your health care provider. Ask your health care provider if you or a family member can get training for taking care of the port at home. A home health care nurse may also take care of the port.  Make sure to remember what type of port you have. Incision care  Follow instructions from your health care provider about how to take care of your port insertion site. Make sure you: ? Wash your hands with soap and water before and after you change your bandage (dressing). If soap and water are not available, use hand sanitizer. ? Change your dressing as told by your health care provider. ? Leave stitches (sutures), skin glue, or adhesive strips in place. These skin closures may need to stay in place for 2 weeks or longer. If adhesive strip edges start to loosen and curl up, you may trim the loose edges. Do not remove adhesive  strips completely unless your health care provider tells you to do that.  Check your port insertion site every day for signs of infection. Check for: ? Redness, swelling, or pain. ? Fluid or blood. ? Warmth. ? Pus or a bad smell. Activity  Return to your normal activities as told by your health care provider. Ask your health care provider what activities are safe for you.  Do not lift anything that is heavier than 10 lb (4.5 kg), or the limit that you are told, until your health care provider says that it is safe. General instructions  Take over-the-counter and prescription medicines only as told by your health care provider.  Do not take baths, swim, or use a hot tub until your health care provider approves. Ask your health care provider if you may take showers. You may only be allowed to take sponge baths.  Do not drive for 24 hours if you were given a sedative during your procedure.  Wear a medical alert bracelet in case of an emergency. This will tell any health care providers that you have a port.  Keep all follow-up visits as told by your health care provider. This is important. Contact a health care provider if:  You cannot flush your port with saline as directed, or you cannot draw blood from the port.  You have a fever or chills.  You have redness, swelling, or pain around your port   insertion site.  You have fluid or blood coming from your port insertion site.  Your port insertion site feels warm to the touch.  You have pus or a bad smell coming from the port insertion site. Get help right away if:  You have chest pain or shortness of breath.  You have bleeding from your port that you cannot control. Summary  Take care of the port as told by your health care provider. Keep the manufacturer's information card with you at all times.  Change your dressing as told by your health care provider.  Contact a health care provider if you have a fever or chills or if you  have redness, swelling, or pain around your port insertion site.  Keep all follow-up visits as told by your health care provider. This information is not intended to replace advice given to you by your health care provider. Make sure you discuss any questions you have with your health care provider. Document Revised: 08/11/2017 Document Reviewed: 08/11/2017 [employer] Patient Education  2020 [employer]   Moderate Conscious Sedation, Adult, Care After These instructions provide you with information about caring for yourself after your procedure. Your health care provider may also give you more specific instructions. Your treatment has been planned according to current medical practices, but problems sometimes occur. Call your health care provider if you have any problems or questions after your procedure. What can I expect after the procedure? After your procedure, it is common:  To feel sleepy for several hours.  To feel clumsy and have poor balance for several hours.  To have poor judgment for several hours.  To vomit if you eat too soon. Follow these instructions at home: For at least 24 hours after the procedure:  Do not: ? Participate in activities where you could fall or become injured. ? Drive. ? Use heavy machinery. ? Drink alcohol. ? Take sleeping pills or medicines that cause drowsiness. ? Make important decisions or sign legal documents. ? Take care of children on your own.  Rest. Eating and drinking  Follow the diet recommended by your health care provider.  If you vomit: ? Drink water, juice, or soup when you can drink without vomiting. ? Make sure you have little or no nausea before eating solid foods. General instructions  Have a responsible adult stay with you until you are awake and alert.  Take over-the-counter and prescription medicines only as told by your health care provider.  If you smoke, do not smoke without supervision.  Keep all follow-up  visits as told by your health care provider. This is important. Contact a health care provider if:  You keep feeling nauseous or you keep vomiting.  You feel light-headed.  You develop a rash.  You have a fever. Get help right away if:  You have trouble breathing. This information is not intended to replace advice given to you by your health care provider. Make sure you discuss any questions you have with your health care provider. Document Revised: 12/26/2016 Document Reviewed: 05/05/2015 [employer] Patient Education  2020 [employer]   

## 2019-09-01 NOTE — H&P (Signed)
Chief Complaint: Patient was seen in consultation today for image-guided port-a-cath insertion.   Referring Physician(s): [name]  Supervising Physician: Jacqulynn Cadet  Patient Status: [facility] - Out-pt  History of Present Illness: Marissa [name] is a 54 y.o. female with a medical history that includes depression, asthma, COPD and small cell lung cancer. She was first diagnosed 07/2018 and underwent chemotherapy. Brain metastasis (right frontal lesion) was discovered 01/2019 and mediastinal node recurrence 07/2019.   Interventional Radiology has been asked to evaluate this patient for an image-guided port-a-cath insertion to facilitate her treatment plans.   Past Medical History:  Diagnosis Date  . Asthma   . Chronic headaches   . COPD (chronic obstructive pulmonary disease) ([place])   . Depression   . Heart murmur   . Small cell lung cancer [facility])     Past Surgical History:  Procedure Laterality Date  . BRONCHIAL BIOPSY  08/24/2018   Procedure: BRONCHIAL BIOPSIES;  Surgeon: Margaretha Seeds, MD;  Location: [facility];  Service: Cardiopulmonary;;  . BRONCHIAL BRUSHINGS  08/24/2018   Procedure: BRONCHIAL BRUSHINGS;  Surgeon: Margaretha Seeds, MD;  Location: [facility];  Service: Cardiopulmonary;;  . BRONCHIAL NEEDLE ASPIRATION BIOPSY  08/24/2018   Procedure: BRONCHIAL NEEDLE ASPIRATION BIOPSIES;  Surgeon: Margaretha Seeds, MD;  Location: [facility];  Service: Cardiopulmonary;;  . ENDOBRONCHIAL ULTRASOUND N/A 08/24/2018   Procedure: ENDOBRONCHIAL ULTRASOUND;  Surgeon: Margaretha Seeds, MD;  Location: [facility];  Service: Cardiopulmonary;  Laterality: N/A;  . IR THORACENTESIS ASP PLEURAL SPACE W/IMG GUIDE  09/14/2018  . TUBAL LIGATION    . VIDEO BRONCHOSCOPY  08/24/2018   Procedure: VIDEO BRONCHOSCOPY;  Surgeon: Margaretha Seeds, MD;  Location: [facility];  Service: Cardiopulmonary;;    Allergies: Penicillins  Medications: Prior to Admission medications   Medication  Sig Start Date End Date Taking? Authorizing Provider  baclofen (LIORESAL) 10 MG tablet Take 1 tablet (10 mg total) by mouth 3 (three) times daily as needed for muscle spasms. Patient not taking: Reported on 08/19/2019 02/21/19   Alla Feeling, NP  dexamethasone (DECADRON) 2 MG tablet Take 1 tablet (2 mg total) by mouth daily. 07/29/19   Ventura Sellers, MD  diazepam (VALIUM) 10 MG tablet Take 1 tablet po 30 minutes prior to MRI or radiation 08/02/19   Ventura Sellers, MD  dronabinol (MARINOL) 2.5 MG capsule Take 1 capsule (2.5 mg total) by mouth 2 (two) times daily before a meal. Patient not taking: Reported on 08/19/2019 05/19/19   [name], MD  dronabinol (MARINOL) 5 MG capsule Take 1 capsule (5 mg total) by mouth 2 (two) times daily before a meal. 08/18/19   [name], MD  DULoxetine (CYMBALTA) 20 MG capsule Take 2 capsules (40 mg total) by mouth daily. Take 1 capsule by mouth daily for the first week, then increase to 2 caps daily Patient not taking: Reported on 08/19/2019 02/17/19   Alla Feeling, NP  gabapentin (NEURONTIN) 300 MG capsule Take 1 tab in morning and 2 tabs at bedtime 07/29/19   Vaslow, Acey Lav, MD  LORazepam (ATIVAN) 1 MG tablet Take 1 tablet (1 mg total) by mouth every 12 (twelve) hours as needed for anxiety. 08/18/19   [name], MD  Oxycodone HCl 10 MG TABS Take 1 tablet (10 mg total) by mouth every 6 (six) hours as needed. 08/18/19   [name], MD  prochlorperazine (COMPAZINE) 10 MG tablet Take 10 mg by mouth every 6 (six) hours as needed for nausea  or vomiting.    [provider]  promethazine (PHENERGAN) 25 MG tablet Take 1 tablet (25 mg total) by mouth every 6 (six) hours as needed for nausea or vomiting. 07/06/19   [name], MD     Family History  Problem Relation Age of Onset  . Heart disease Mother   . Dementia Father     Social History   Socioeconomic History  . Marital status: Single    Spouse name: Not on file  . Number of children: Not on file  .  Years of education: Not on file  . Highest education level: Not on file  Occupational History  . Not on file  Tobacco Use  . Smoking status: Current Every Day Smoker    Packs/day: 2.00    Years: 39.00    Pack years: 78.00    Types: Cigarettes    Start date: 06/22/1979  . Smokeless tobacco: Never Used  Vaping Use  . Vaping Use: Never used  Substance and Sexual Activity  . Alcohol use: Yes    Comment: occasional  . Drug use: Not Currently    Types: Marijuana  . Sexual activity: Yes    Birth control/protection: Surgical  Other Topics Concern  . Not on file  Social History Narrative  . Not on file   Social Determinants of Health   Financial Resource Strain:   . Difficulty of Paying Living Expenses:   Food Insecurity:   . Worried About Charity fundraiser in the Last Year:   . Arboriculturist in the Last Year:   Transportation Needs: Unmet Transportation Needs  . Lack of Transportation (Medical): Yes  . Lack of Transportation (Non-Medical): Yes  Physical Activity:   . Days of Exercise per Week:   . Minutes of Exercise per Session:   Stress:   . Feeling of Stress :   Social Connections:   . Frequency of Communication with Friends and Family:   . Frequency of Social Gatherings with Friends and Family:   . Attends Religious Services:   . Active Member of Clubs or Organizations:   . Attends Archivist Meetings:   Marland Kitchen Marital Status:     Review of Systems: A 12 point ROS discussed and pertinent positives are indicated in the HPI above.  All other systems are negative.  Review of Systems  Constitutional: Positive for appetite change and fatigue.  Respiratory: Positive for shortness of breath.   Gastrointestinal: Positive for nausea. Negative for diarrhea and vomiting.  Musculoskeletal: Negative for back pain.  Neurological: Negative for headaches.    Vital Signs: BP (!) 136/57   Pulse 80   Temp 97.6 F (36.4 C) (Oral)   Resp 18   LMP 04/24/2012   SpO2  100%   Physical Exam Constitutional:      General: She is not in acute distress. HENT:     Mouth/Throat:     Mouth: Mucous membranes are moist.     Pharynx: Oropharynx is clear.  Cardiovascular:     Rate and Rhythm: Normal rate and regular rhythm.     Pulses: Normal pulses.     Heart sounds: Normal heart sounds.  Pulmonary:     Effort: Pulmonary effort is normal.     Breath sounds: Normal breath sounds.  Abdominal:     General: Bowel sounds are normal.     Palpations: Abdomen is soft.  Musculoskeletal:        General: Normal range of motion.  Skin:  General: Skin is warm and dry.  Neurological:     Mental Status: She is alert and oriented to person, place, and time.     Imaging: DG Chest 2 View  Result Date: 08/19/2019 CLINICAL DATA:  Small-cell lung cancer, chest pain EXAM: CHEST - 2 VIEW COMPARISON:  None. FINDINGS: Lungs are clear. No pneumothorax or pleural effusion. Cardiac size within normal limits. Pulmonary vascularity is normal. 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 No acute bone abnormality. IMPRESSION: No active cardiopulmonary disease. Electronically Signed   By: Fidela Salisbury MD   On: 08/19/2019 17:07   CT Chest W Contrast  Result Date: 08/10/2019 CLINICAL DATA:  Small-cell lung cancer diagnosed 7/20. Chemotherapy and radiation therapy complete. Chest pain. Mid and lower back pain. Cough. Shortness of breath. Nausea and vomiting. EXAM: CT CHEST, ABDOMEN, AND PELVIS WITH CONTRAST TECHNIQUE: Multidetector CT imaging of the chest, abdomen and pelvis was performed following the standard protocol during bolus administration of intravenous contrast. CONTRAST:  141mL OMNIPAQUE IOHEXOL 300 MG/ML  SOLN COMPARISON:  05/17/2019 FINDINGS: CT CHEST FINDINGS Cardiovascular: Aortic and branch vessel atherosclerosis. Mild cardiomegaly, without pericardial effusion. No central pulmonary embolism, on this non-dedicated study. Mediastinum/Nodes: A  left low jugular/supraclavicular node measures 7 mm on 02/02 and is new. New or significantly enlarged right paratracheal node of 7 mm on 16/2. Centrally necrotic AP window node measures 1.3 cm on 22/2 versus 9 mm on the prior No hilar adenopathy. Lungs/Pleura: No pleural fluid. Developing subsegmental atelectasis at the right lung base laterally. Paraseptal emphysema. Slight decrease in trace left apical pleural thickening including on coronal image 105. Similar soft tissue thickening along the lateral aspect of the AP window, including on 20/2. This dependent posterior left upper lobe opacity is similar on 43/7, favored to represent subsegmental atelectasis. Example at 6 mm. Ovoid soft tissue thickening along the left major fissure is similar on 56/7. Musculoskeletal: No acute osseous abnormality. CT ABDOMEN PELVIS FINDINGS Hepatobiliary: Normal liver. Normal gallbladder, without biliary ductal dilatation. Pancreas: Normal, without mass or ductal dilatation. Spleen: Normal in size, without focal abnormality. Adrenals/Urinary Tract: Left adrenal nodule of 2.5 cm is similar in size, likely an adenoma based on washout characteristics. Normal kidneys, without hydronephrosis. Normal urinary bladder. Stomach/Bowel: Normal stomach, without wall thickening. Normal colon, appendix, and terminal ileum. Normal small bowel. Vascular/Lymphatic: Aortic atherosclerosis. No abdominopelvic adenopathy. Reproductive: Normal uterus and adnexa. Other: No significant free fluid. No evidence of omental or peritoneal disease. Musculoskeletal: No acute osseous abnormality. Mild S shaped thoracolumbar spine curvature. IMPRESSION: 1. New and progressive adenopathy within the mediastinum and low left neck, consistent with nodal metastasis. 2. No evidence of pulmonary metastasis. Similar areas of probable scarring and pleural thickening. 3. No subdiaphragmatic metastatic disease identified. 4. Left adrenal adenoma. 5. Age advanced aortic  atherosclerosis (ICD10-I70.0) and emphysema (ICD10-J43.9). Electronically Signed   By: Abigail Miyamoto M.D.   On: 08/10/2019 16:11   CT Angio Chest PE W and/or Wo Contrast  Result Date: 08/19/2019 CLINICAL DATA:  History of metastatic small cell lung cancer, shortness of breath EXAM: CT ANGIOGRAPHY CHEST WITH CONTRAST TECHNIQUE: Multidetector CT imaging of the chest was performed using the standard protocol during bolus administration of intravenous contrast. Multiplanar CT image reconstructions and MIPs were obtained to evaluate the vascular anatomy. CONTRAST:  173mL OMNIPAQUE IOHEXOL 350 MG/ML SOLN COMPARISON:  08/10/2019 FINDINGS: Cardiovascular: This is a technically adequate evaluation of the pulmonary vasculature. No filling defects or pulmonary emboli. The heart is unremarkable  without pericardial effusion. No evidence of thoracic aortic aneurysm or dissection. Stable mild atherosclerosis. Mediastinum/Nodes: The left internal jugular chain lymph node seen on previous study is only partially visualized on this exam, measuring 8 mm in short axis. 13 mm short axis AP window lymph node image 43/4 unchanged. 7 mm right paratracheal lymph node image 29/4 unchanged. No new adenopathy. Thyroid, trachea, and esophagus are unremarkable. Lungs/Pleura: Background emphysema is again noted. There is interlobular septal thickening, with new ground-glass airspace disease seen within the right upper and left lower lobes. Findings could reflect mild edema versus atypical infection. No effusion or pneumothorax. The central airways are patent. Stable ovoid soft tissue within the left major fissure. Upper Abdomen: Stable 3 cm left adrenal nodule. The remainder of the upper abdomen is unremarkable. Musculoskeletal: No acute or destructive bony lesions. Reconstructed images demonstrate no additional findings. Review of the MIP images confirms the above findings. IMPRESSION: 1. No evidence of pulmonary embolus. 2. Stable nodal  metastasis within the mediastinum and left internal jugular chain. 3. Interval development of mild interlobular septal thickening and ground-glass opacity, greatest in the right upper lobe and left lower lobe. Differential includes asymmetric edema versus atypical infection. 4. Aortic Atherosclerosis (ICD10-I70.0) and Emphysema (ICD10-J43.9). Electronically Signed   By: Randa Ngo M.D.   On: 08/19/2019 19:35   MR BRAIN W WO CONTRAST  Result Date: 08/25/2019 CLINICAL DATA:  Brain/CNS neoplasm, assess treatment response. EXAM: MRI HEAD WITHOUT AND WITH CONTRAST TECHNIQUE: Multiplanar, multiecho pulse sequences of the brain and surrounding structures were obtained without and with intravenous contrast. CONTRAST:  9mL GADAVIST GADOBUTROL 1 MMOL/ML IV SOLN COMPARISON:  07/13/2019 MRI brain. FINDINGS: Brain: Increased size of 4.0 x 3.2 cm enhancing centrally necrotic right frontal lesion (19:103, previously 2.6 x 2.0 cm). The anterior margin of the lesion demonstrates decreased enhancement. Increased perilesional edema. Intralesional foci of restricted diffusion are less conspicuous than prior exam. Increased intralesional SWI signal dropout with correlative punctate T1 hyperintense foci reflect hemorrhage. Mild dural thickening and enhancement overlying the right frontal convexity is more conspicuous than prior exam. No new enhancing lesion. No midline shift, ventriculomegaly or extra-axial fluid collection. Nonspecific background scattered T2 hyperintense supratentorial white matter foci are unchanged. Vascular: Normal flow voids. Skull and upper cervical spine: Normal marrow signal. No focal osseous lesion. Sinuses/Orbits: Normal orbits. Clear paranasal sinuses. Trace bilateral mastoid effusions. Other: None. IMPRESSION: Increased size of centrally necrotic enhancing right frontal lesion measuring 4.0 cm, previously 2.6 cm. White matter edema and dural thickening/enhancement involving the right frontal  convexity is more conspicuous than prior exam. No new enhancing lesion. Electronically Signed   By: Primitivo Gauze M.D.   On: 08/25/2019 16:52   CT Abdomen Pelvis W Contrast  Result Date: 08/10/2019 CLINICAL DATA:  Small-cell lung cancer diagnosed 7/20. Chemotherapy and radiation therapy complete. Chest pain. Mid and lower back pain. Cough. Shortness of breath. Nausea and vomiting. EXAM: CT CHEST, ABDOMEN, AND PELVIS WITH CONTRAST TECHNIQUE: Multidetector CT imaging of the chest, abdomen and pelvis was performed following the standard protocol during bolus administration of intravenous contrast. CONTRAST:  133mL OMNIPAQUE IOHEXOL 300 MG/ML  SOLN COMPARISON:  05/17/2019 FINDINGS: CT CHEST FINDINGS Cardiovascular: Aortic and branch vessel atherosclerosis. Mild cardiomegaly, without pericardial effusion. No central pulmonary embolism, on this non-dedicated study. Mediastinum/Nodes: A left low jugular/supraclavicular node measures 7 mm on 02/02 and is new. New or significantly enlarged right paratracheal node of 7 mm on 16/2. Centrally necrotic AP window node measures 1.3 cm on 22/2 versus  9 mm on the prior No hilar adenopathy. Lungs/Pleura: No pleural fluid. Developing subsegmental atelectasis at the right lung base laterally. Paraseptal emphysema. Slight decrease in trace left apical pleural thickening including on coronal image 105. Similar soft tissue thickening along the lateral aspect of the AP window, including on 20/2. This dependent posterior left upper lobe opacity is similar on 43/7, favored to represent subsegmental atelectasis. Example at 6 mm. Ovoid soft tissue thickening along the left major fissure is similar on 56/7. Musculoskeletal: No acute osseous abnormality. CT ABDOMEN PELVIS FINDINGS Hepatobiliary: Normal liver. Normal gallbladder, without biliary ductal dilatation. Pancreas: Normal, without mass or ductal dilatation. Spleen: Normal in size, without focal abnormality. Adrenals/Urinary  Tract: Left adrenal nodule of 2.5 cm is similar in size, likely an adenoma based on washout characteristics. Normal kidneys, without hydronephrosis. Normal urinary bladder. Stomach/Bowel: Normal stomach, without wall thickening. Normal colon, appendix, and terminal ileum. Normal small bowel. Vascular/Lymphatic: Aortic atherosclerosis. No abdominopelvic adenopathy. Reproductive: Normal uterus and adnexa. Other: No significant free fluid. No evidence of omental or peritoneal disease. Musculoskeletal: No acute osseous abnormality. Mild S shaped thoracolumbar spine curvature. IMPRESSION: 1. New and progressive adenopathy within the mediastinum and low left neck, consistent with nodal metastasis. 2. No evidence of pulmonary metastasis. Similar areas of probable scarring and pleural thickening. 3. No subdiaphragmatic metastatic disease identified. 4. Left adrenal adenoma. 5. Age advanced aortic atherosclerosis (ICD10-I70.0) and emphysema (ICD10-J43.9). Electronically Signed   By: Abigail Miyamoto M.D.   On: 08/10/2019 16:11    Labs:  CBC: Recent Labs    07/06/19 0907 08/18/19 1339 08/19/19 1617 09/01/19 1254  WBC 7.8 12.2* 9.3 6.9  HGB 14.2 13.3 13.6 14.3  HCT 44.2 40.7 43.4 44.7  PLT 275 341 328 300    COAGS: Recent Labs    09/01/19 1254  INR 0.9    BMP: Recent Labs    05/05/19 1337 07/06/19 0907 08/18/19 1339 08/19/19 1617  NA 142 144 145 142  K 3.3* 3.9 4.6 4.0  CL 104 105 106 109  CO2 32 28 28 22   GLUCOSE 97 86 96 120*  BUN 14 24* 29* 28*  CALCIUM 9.4 10.3 9.5 9.7  CREATININE 0.82 1.00 1.23* 0.75  GFRNONAA >60 >60 50* >60  GFRAA >60 >60 58* >60    LIVER FUNCTION TESTS: Recent Labs    02/03/19 1406 05/05/19 1337 07/06/19 0907 08/18/19 1339  BILITOT <0.2* 0.5 <0.2* <0.1*  AST 14* 19 31 19   ALT 10 17 45* 28  ALKPHOS 106 99 81 67  PROT 7.7 7.1 7.6 7.4  ALBUMIN 4.0 3.7 3.9 4.1    TUMOR MARKERS: No results for input(s): AFPTM, CEA, CA199, CHROMGRNA in the last 8760  hours.  Assessment and Plan:  Small cell lung cancer with metastasis: Marissa [name], 54 year old female, presents today to the [facility] department for an image-guided port-a-cath insertion.   Risks and benefits of image-guided port-a-catheter placement were discussed with the patient including, but not limited to bleeding, infection, pneumothorax, or fibrin sheath development and need for additional procedures.  All of the patient's questions were answered, patient is agreeable to proceed.  The patient has been NPO. Labs and vitals have been reviewed.   Consent signed and in chart.  Thank you for this interesting consult.  I greatly enjoyed meeting Jaslen Adcox and look forward to participating in their care.  A copy of this report was sent to the requesting provider on this date.  Electronically Signed: Soyla Dryer, AGACNP-BC [phone]  09/01/2019, 2:34 PM   I spent a total of  30 Minutes   in face to face in clinical consultation, greater than 50% of which was counseling/coordinating care for an image-guided port-a-cath insertion.

## 2019-09-01 NOTE — Procedures (Signed)
Interventional Radiology Procedure Note  Procedure: Placement of a right IJ approach single lumen PowerPort.  Tip is positioned at the superior cavoatrial junction and catheter is ready for immediate use.  Complications: No immediate Recommendations:  - Ok to shower tomorrow - Do not submerge for 7 days - Routine line care   Signed,  Marqui Formby K. Rakayla Ricklefs, MD   

## 2019-09-02 ENCOUNTER — Inpatient Hospital Stay (HOSPITAL_BASED_OUTPATIENT_CLINIC_OR_DEPARTMENT_OTHER): Payer: Medicaid Other | Admitting: Internal Medicine

## 2019-09-02 ENCOUNTER — Other Ambulatory Visit: Payer: Self-pay

## 2019-09-02 VITALS — BP 107/57 | HR 90 | Temp 97.6°F | Resp 20 | Ht 62.0 in | Wt 161.6 lb

## 2019-09-02 DIAGNOSIS — Z923 Personal history of irradiation: Secondary | ICD-10-CM | POA: Diagnosis not present

## 2019-09-02 DIAGNOSIS — R519 Headache, unspecified: Secondary | ICD-10-CM

## 2019-09-02 DIAGNOSIS — C7931 Secondary malignant neoplasm of brain: Secondary | ICD-10-CM | POA: Diagnosis not present

## 2019-09-02 DIAGNOSIS — I700 Atherosclerosis of aorta: Secondary | ICD-10-CM | POA: Diagnosis not present

## 2019-09-02 DIAGNOSIS — F17210 Nicotine dependence, cigarettes, uncomplicated: Secondary | ICD-10-CM | POA: Diagnosis not present

## 2019-09-02 DIAGNOSIS — C3412 Malignant neoplasm of upper lobe, left bronchus or lung: Secondary | ICD-10-CM | POA: Diagnosis not present

## 2019-09-02 DIAGNOSIS — Z515 Encounter for palliative care: Secondary | ICD-10-CM

## 2019-09-02 DIAGNOSIS — C771 Secondary and unspecified malignant neoplasm of intrathoracic lymph nodes: Secondary | ICD-10-CM | POA: Diagnosis not present

## 2019-09-02 DIAGNOSIS — Z79899 Other long term (current) drug therapy: Secondary | ICD-10-CM | POA: Diagnosis not present

## 2019-09-02 DIAGNOSIS — Z9221 Personal history of antineoplastic chemotherapy: Secondary | ICD-10-CM | POA: Diagnosis not present

## 2019-09-02 DIAGNOSIS — D3502 Benign neoplasm of left adrenal gland: Secondary | ICD-10-CM | POA: Diagnosis not present

## 2019-09-02 NOTE — Progress Notes (Signed)
[facility] [place], [place] [address] [phone]   Interval Evaluation  Date of Service: 09/02/19 Patient Name: Marissa [name] Patient MRN: [id number] Patient DOB: 11-26-1965 Provider: Ventura Sellers, MD  Identifying Statement:  Marissa [name] is a 54 y.o. female with Brain metastases ([place]) [C79.31]  Primary Cancer:  Oncologic History: Oncology History Overview Note  Cancer Staging Small cell lung cancer, left upper lobe [facility]) Staging form: Lung, AJCC 8th Edition - Clinical stage from 08/24/2018: Stage IIIA (cT1a, cN2, cM0) - Signed by [name], MD on 10/17/2018    Small cell lung cancer, left upper lobe ([place])  07/29/2018 Imaging   CT Angio Chest 07/29/18  IMPRESSION: 1. Malignant appearing left mediastinal and hilar lymphadenopathy narrowing the airways at the left hilum. In the left lung only a subtle 9 mm spiculated nodule is identified in the lingula. Consider small cell carcinoma. Bronchoscopy or mediastinal node sampling might be most valuable for diagnosis. 2. Indeterminate although low-density (which typically which indicates a benign adenoma) left adrenal nodule. 3.  Negative for acute pulmonary embolus.   08/24/2018 Definitive Surgery   Bronchoscopy and EBUS biopsy on 08/24/18 by Dr. Loanne Drilling   08/24/2018 Initial Biopsy   Diagnosis 08/24/18 FINE NEEDLE ASPIRATION, ENDOSCOPIC, (EBUS) STATION # 7 LYMPH NODE(SPECIMEN 1 OF 4 COLLECTED 08/24/18): NO MALIGNANT CELLS IDENTIFIED.  FINE NEEDLE ASPIRATION, ENDOSCOPIC, (EBUS) LEFT HILAR LYMPH NODE(SPECIMEN 2 OF 4 COLLECTED 08/24/18): NO MALIGNANT CELLS IDENTIFIED.  TRANSBRONCHIAL NEEDLE ASPIRATION, WANG, (SPECIMEN 3 OF 4 COLLECTED 08/24/18): NO MALIGNANT CELLS IDENTIFIED.  BRONCHIAL BRUSHING, LUL (SPECIMEN 4 OF 4 COLLECTED 08/24/18): MALIGNANT CELLS PRESENT, MOST CONSISTENT WITH SMALL CELL CARCINOMA. SEE COMMENT.   08/24/2018 Cancer Staging   Staging form: Lung, AJCC 8th  Edition - Clinical stage from 08/24/2018: Stage IIIA (cT1a, cN2, cM0) - Signed by [name], MD on 10/17/2018   08/26/2018 Initial Diagnosis   Small cell lung cancer, left ([place])   08/26/2018 Imaging   CT AP W Contrast 08/26/18  IMPRESSION: 1. 2.9 cm left adrenal nodule cannot be definitively characterized on this study. Potentially a lipid poor adenoma, but metastatic disease not excluded. MRI of the abdomen with and without contrast may prove helpful to further evaluate. 2. No other findings to suggest metastatic disease in the abdomen/pelvis. 3. Left base atelectasis with small left pleural effusion.   08/27/2018 Imaging   MRI brain 08/27/18  IMPRESSION: 1. Motion degraded, incomplete examination. 2. Subcentimeter focus of diffusion signal abnormality in the left cerebellum. No enhancement is evident to strongly suggest a metastasis, and this may reflect an acute to subacute small vessel infarct. 3. No evidence of intracranial metastatic disease elsewhere. 4. Given motion artifact and incomplete postcontrast imaging, consider short-term follow-up MRI (possibly with sedation) to further evaluate the left cerebellar abnormality and provide a more thorough evaluation for metastatic disease.   09/02/2018 PET scan   IMPRESSION: 1. Hypermetabolic left hilar mass is identified encasing the left mainstem bronchus resulting in postobstructive atelectasis in the left upper lobe. 2. Hypermetabolic left mediastinal nodal metastasis. 3. Loculated left pleural effusion is identified with mild increased FDG uptake. Cannot rule out malignant pleural effusion. 4. No evidence for metastatic disease to the abdomen or pelvis or skeletal structures.     09/07/2018 - 11/10/2018 Chemotherapy   Concurrent chemoRT with IV cisplatin on day 1 and etopside day 1-3 for every 3 weeks starting 8/11. Will start radiation with Dr. Lisbeth Renshaw with cycle 2. She completed 4 cycles  on 11/10/18   09/14/2018 Procedure    Thoracentesis yielding 750 mL   09/28/2018 - 12/09/2019 Radiation Therapy   Concurrent chemoRT with Dr. Lisbeth Renshaw. Start with cycle 2 chemo on 09/28/18 and completed on 12/09/18.    02/10/2019 Imaging   CT CAP W Contrast  IMPRESSION: 1. Interval response to therapy. Significant interval reduction in tumor burden involving the left hemithorax. 2. No new or progressive disease identified within the chest, abdomen or pelvis. 3. Emphysema and aortic atherosclerosis. 4. Stable left adrenal nodule.   Aortic Atherosclerosis (ICD10-I70.0) and Emphysema (ICD10-J43.9).   02/11/2019 Imaging   MRI Brain  IMPRESSION: 6 x 10 mm solitary metastatic deposit right frontal lobe with minimal edema.   02/28/2019 - 03/14/2019 Radiation Therapy   Target Brain Radiation with Dr. Lisbeth Renshaw    05/19/2019 Imaging   CT CAP w contrast  IMPRESSION: 1. Trace residual soft tissue thickening at the left lung apex and along the AP window. Slight increase in size of a low left paratracheal lymph node. Continued attention on follow-up exams is warranted. No evidence of distant metastatic disease. 2. Subpleural density in the posterior left upper lobe is new and may be due to atelectasis. Recommend attention on follow-up. 3. Left adrenal adenoma. 4. Aortic atherosclerosis (ICD10-I70.0). Coronary artery calcification. 5.  Emphysema (ICD10-J43.9).   07/13/2019 Imaging   MRI brain  IMPRESSION: 1. Increased size of right frontal lobe metastasis with new moderate edema. 2. No evidence of new intracranial metastases.     08/10/2019 Imaging   CT CAP W Contrast  IMPRESSION: 1. New and progressive adenopathy within the mediastinum and low left neck, consistent with nodal metastasis. 2. No evidence of pulmonary metastasis. Similar areas of probable scarring and pleural thickening. 3. No subdiaphragmatic metastatic disease identified. 4. Left adrenal adenoma. 5. Age advanced aortic atherosclerosis (ICD10-I70.0) and  emphysema (ICD10-J43.9).     08/19/2019 Imaging   Ct Angio Chest  IMPRESSION: 1. No evidence of pulmonary embolus. 2. Stable nodal metastasis within the mediastinum and left internal jugular chain. 3. Interval development of mild interlobular septal thickening and ground-glass opacity, greatest in the right upper lobe and left lower lobe. Differential includes asymmetric edema versus atypical infection. 4. Aortic Atherosclerosis (ICD10-I70.0) and Emphysema (ICD10-J43.9).     08/25/2019 Imaging   MRI Brain  IMPRESSION: Increased size of centrally necrotic enhancing right frontal lesion measuring 4.0 cm, previously 2.6 cm.   White matter edema and dural thickening/enhancement involving the right frontal convexity is more conspicuous than prior exam.   No new enhancing lesion.   09/01/2019 Procedure   PAC placement    09/05/2019 -  Chemotherapy   The patient had PALONOSETRON HCL INJECTION 0.25 MG/5ML, 0.25 mg, Intravenous,  Once, 0 of 4 cycles pegfilgrastim-jmdb (FULPHILA) injection 6 mg, 6 mg, Subcutaneous,  Once, 0 of 4 cycles CARBOplatin (PARAPLATIN) in sodium chloride 0.9 % 100 mL chemo infusion, , Intravenous,  Once, 0 of 4 cycles etoposide (VEPESID) 180 mg in sodium chloride 0.9 % 500 mL chemo infusion, 100 mg/m2, Intravenous,  Once, 0 of 4 cycles FOSAPREPITANT IV INFUSION 150 MG, 150 mg, Intravenous,  Once, 0 of 4 cycles atezolizumab (TECENTRIQ) 1,200 mg in sodium chloride 0.9 % 250 mL chemo infusion, 1,200 mg, Intravenous, Once, 0 of 8 cycles  for chemotherapy treatment.      [place]aval History:  Marissa [name] presents today for follow up after recent MRI brain.  She describes continual headaches occurring daily. Still describes dizziness, fatigue, poor energy. Still ambulating independently, functional status  good with regards to ADLs.  Just underwent port placement, planned salvage chemo next week for progressive disease in chest.  H+P (02/21/19) Patient describes years long  history of frequent headaches.  They are described as lateralized throbbing with nausea and photophobia, lasting for hours up to an entire day.  For the past month her headaches have been daily and of particular burden.  No time of day or positional association.  For cancer associated LUQ pain, she has been taking oxycodone 5mg  5-6x per day over the past ~6 months.  Prior to that she had some chronic use of opiates for back pain.  Also reports 2-3x per day usage of "goody powder" although she stopped that several days ago.  Smokes marijuana frequently and takes ativan for anxiety symptoms.  Currently scheduled for WBRT for small R frontal metastasis identified on screening MRI.  SIM'd today.  Currently on observation for small cell lung ca.    Medications: Current Outpatient Medications on File Prior to Visit  Medication Sig Dispense Refill  . baclofen (LIORESAL) 10 MG tablet Take 1 tablet (10 mg total) by mouth 3 (three) times daily as needed for muscle spasms. (Patient not taking: Reported on 08/19/2019) 30 each 0  . dexamethasone (DECADRON) 2 MG tablet Take 1 tablet (2 mg total) by mouth daily. 30 tablet 0  . diazepam (VALIUM) 10 MG tablet Take 1 tablet po 30 minutes prior to MRI or radiation 5 tablet 0  . dronabinol (MARINOL) 2.5 MG capsule Take 1 capsule (2.5 mg total) by mouth 2 (two) times daily before a meal. (Patient not taking: Reported on 08/19/2019) 30 capsule 0  . dronabinol (MARINOL) 5 MG capsule Take 1 capsule (5 mg total) by mouth 2 (two) times daily before a meal. 60 capsule 0  . DULoxetine (CYMBALTA) 20 MG capsule Take 2 capsules (40 mg total) by mouth daily. Take 1 capsule by mouth daily for the first week, then increase to 2 caps daily 60 capsule 1  . gabapentin (NEURONTIN) 300 MG capsule Take 1 tab in morning and 2 tabs at bedtime 90 capsule 2  . LORazepam (ATIVAN) 1 MG tablet Take 1 tablet (1 mg total) by mouth every 12 (twelve) hours as needed for anxiety. 30 tablet 0  . Oxycodone  HCl 10 MG TABS Take 1 tablet (10 mg total) by mouth every 6 (six) hours as needed. 90 tablet 0  . prochlorperazine (COMPAZINE) 10 MG tablet Take 10 mg by mouth every 6 (six) hours as needed for nausea or vomiting.    . promethazine (PHENERGAN) 25 MG tablet Take 1 tablet (25 mg total) by mouth every 6 (six) hours as needed for nausea or vomiting. 30 tablet 1   No current facility-administered medications on file prior to visit.    Allergies:  Allergies  Allergen Reactions  . Penicillins Nausea And Vomiting    Did it involve swelling of the face/tongue/throat, SOB, or low BP? No Did it involve sudden or severe rash/hives, skin peeling, or any reaction on the inside of your mouth or nose? No Did you need to seek medical attention at a hospital or doctor's office? No When did it last happen?20 years If all above answers are "NO", may proceed with cephalosporin use.   Past Medical History:  Past Medical History:  Diagnosis Date  . Asthma   . Chronic headaches   . COPD (chronic obstructive pulmonary disease) ([place])   . Depression   . Dyspnea    hospitalized July 2021  .  Heart murmur   . Small cell lung cancer [facility])    Past Surgical History:  Past Surgical History:  Procedure Laterality Date  . BRONCHIAL BIOPSY  08/24/2018   Procedure: BRONCHIAL BIOPSIES;  Surgeon: Margaretha Seeds, MD;  Location: [facility];  Service: Cardiopulmonary;;  . BRONCHIAL BRUSHINGS  08/24/2018   Procedure: BRONCHIAL BRUSHINGS;  Surgeon: Margaretha Seeds, MD;  Location: [facility];  Service: Cardiopulmonary;;  . BRONCHIAL NEEDLE ASPIRATION BIOPSY  08/24/2018   Procedure: BRONCHIAL NEEDLE ASPIRATION BIOPSIES;  Surgeon: Margaretha Seeds, MD;  Location: [facility];  Service: Cardiopulmonary;;  . ENDOBRONCHIAL ULTRASOUND N/A 08/24/2018   Procedure: ENDOBRONCHIAL ULTRASOUND;  Surgeon: Margaretha Seeds, MD;  Location: [facility];  Service: Cardiopulmonary;  Laterality: N/A;  . IR IMAGING GUIDED PORT  INSERTION  09/01/2019  . IR THORACENTESIS ASP PLEURAL SPACE W/IMG GUIDE  09/14/2018  . TUBAL LIGATION    . VIDEO BRONCHOSCOPY  08/24/2018   Procedure: VIDEO BRONCHOSCOPY;  Surgeon: Margaretha Seeds, MD;  Location: [facility];  Service: Cardiopulmonary;;   Social History:  Social History   Socioeconomic History  . Marital status: Single    Spouse name: Not on file  . Number of children: Not on file  . Years of education: Not on file  . Highest education level: Not on file  Occupational History  . Not on file  Tobacco Use  . Smoking status: Current Every Day Smoker    Packs/day: 2.00    Years: 39.00    Pack years: 78.00    Types: Cigarettes    Start date: 06/22/1979  . Smokeless tobacco: Never Used  Vaping Use  . Vaping Use: Never used  Substance and Sexual Activity  . Alcohol use: Yes    Comment: occasional  . Drug use: Yes    Types: Marijuana    Comment: for appetite  . Sexual activity: Yes    Birth control/protection: Surgical  Other Topics Concern  . Not on file  Social History Narrative  . Not on file   Social Determinants of Health   Financial Resource Strain:   . Difficulty of Paying Living Expenses:   Food Insecurity:   . Worried About Charity fundraiser in the Last Year:   . Arboriculturist in the Last Year:   Transportation Needs: Unmet Transportation Needs  . Lack of Transportation (Medical): Yes  . Lack of Transportation (Non-Medical): Yes  Physical Activity:   . Days of Exercise per Week:   . Minutes of Exercise per Session:   Stress:   . Feeling of Stress :   Social Connections:   . Frequency of Communication with Friends and Family:   . Frequency of Social Gatherings with Friends and Family:   . Attends Religious Services:   . Active Member of Clubs or Organizations:   . Attends Archivist Meetings:   Marland Kitchen Marital Status:   Intimate Partner Violence:   . Fear of Current or Ex-Partner:   . Emotionally Abused:   Marland Kitchen Physically Abused:    . Sexually Abused:    Family History:  Family History  Problem Relation Age of Onset  . Heart disease Mother   . Dementia Father     Review of Systems: Constitutional: Doesn't report fevers, chills or abnormal weight loss Eyes: Doesn't report blurriness of vision Ears, nose, mouth, throat, and face: Doesn't report sore throat Respiratory: Doesn't report cough, dyspnea or wheezes Cardiovascular: Doesn't report palpitation, chest discomfort  Gastrointestinal:  Doesn't report  nausea, constipation, diarrhea GU: Doesn't report incontinence Skin: Doesn't report skin rashes Neurological: Per HPI Musculoskeletal: Back pain Behavioral/Psych: Doesn't report anxiety  Physical Exam: Vitals:   09/02/19 1218  BP: (!) 107/57  Pulse: 90  Resp: 20  Temp: 97.6 F (36.4 C)  SpO2: 99%    KPS: 90. General: Alert, cooperative, pleasant, in no acute distress Head: Normal EENT: No conjunctival injection or scleral icterus.  Lungs: Resp effort normal Cardiac: Regular rate Abdomen: Non-distended abdomen Skin: No rashes cyanosis or petechiae. Extremities: No clubbing or edema  Neurologic Exam: Mental Status: Awake, alert, attentive to examiner. Oriented to self and environment. Language is fluent with intact comprehension.  Cranial Nerves: Visual acuity is grossly normal. Visual fields are full. Extra-ocular movements intact. No ptosis. Face is symmetric Motor: Tone and bulk are normal. Power is full in both arms and legs.  Sensory: Intact to light touch Gait: Normal.   Labs: I have reviewed the data as listed    Component Value Date/Time   NA 142 08/19/2019 1617   K 4.0 08/19/2019 1617   CL 109 08/19/2019 1617   CO2 22 08/19/2019 1617   GLUCOSE 120 (H) 08/19/2019 1617   BUN 28 (H) 08/19/2019 1617   CREATININE 0.75 08/19/2019 1617   CREATININE 1.23 (H) 08/18/2019 1339   CALCIUM 9.7 08/19/2019 1617   PROT 7.4 08/18/2019 1339   ALBUMIN 4.1 08/18/2019 1339   AST 19 08/18/2019  1339   ALT 28 08/18/2019 1339   ALKPHOS 67 08/18/2019 1339   BILITOT <0.1 (L) 08/18/2019 1339   GFRNONAA >60 08/19/2019 1617   GFRNONAA 50 (L) 08/18/2019 1339   GFRAA >60 08/19/2019 1617   GFRAA 58 (L) 08/18/2019 1339   Lab Results  Component Value Date   WBC 6.9 09/01/2019   NEUTROABS 5.5 09/01/2019   HGB 14.3 09/01/2019   HCT 44.7 09/01/2019   MCV 96.5 09/01/2019   PLT 300 09/01/2019   Imaging:  [place] Clinician Interpretation: I have personally reviewed the CNS images as listed.  My interpretation, in the context of the patient's clinical presentation, is progressive disease  DG Chest 2 View  Result Date: 08/19/2019 CLINICAL DATA:  Small-cell lung cancer, chest pain EXAM: CHEST - 2 VIEW COMPARISON:  None. FINDINGS: Lungs are clear. No pneumothorax or pleural effusion. Cardiac size within normal limits. Pulmonary vascularity is normal. 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 No acute bone abnormality. IMPRESSION: No active cardiopulmonary disease. Electronically Signed   By: Fidela Salisbury MD   On: 08/19/2019 17:07   CT Chest W Contrast  Result Date: 08/10/2019 CLINICAL DATA:  Small-cell lung cancer diagnosed 7/20. Chemotherapy and radiation therapy complete. Chest pain. Mid and lower back pain. Cough. Shortness of breath. Nausea and vomiting. EXAM: CT CHEST, ABDOMEN, AND PELVIS WITH CONTRAST TECHNIQUE: Multidetector CT imaging of the chest, abdomen and pelvis was performed following the standard protocol during bolus administration of intravenous contrast. CONTRAST:  176mL OMNIPAQUE IOHEXOL 300 MG/ML  SOLN COMPARISON:  05/17/2019 FINDINGS: CT CHEST FINDINGS Cardiovascular: Aortic and branch vessel atherosclerosis. Mild cardiomegaly, without pericardial effusion. No central pulmonary embolism, on this non-dedicated study. Mediastinum/Nodes: A left low jugular/supraclavicular node measures 7 mm on 02/02 and is new. New or significantly enlarged right  paratracheal node of 7 mm on 16/2. Centrally necrotic AP window node measures 1.3 cm on 22/2 versus 9 mm on the prior No hilar adenopathy. Lungs/Pleura: No pleural fluid. Developing subsegmental atelectasis at the right lung base laterally.  Paraseptal emphysema. Slight decrease in trace left apical pleural thickening including on coronal image 105. Similar soft tissue thickening along the lateral aspect of the AP window, including on 20/2. This dependent posterior left upper lobe opacity is similar on 43/7, favored to represent subsegmental atelectasis. Example at 6 mm. Ovoid soft tissue thickening along the left major fissure is similar on 56/7. Musculoskeletal: No acute osseous abnormality. CT ABDOMEN PELVIS FINDINGS Hepatobiliary: Normal liver. Normal gallbladder, without biliary ductal dilatation. Pancreas: Normal, without mass or ductal dilatation. Spleen: Normal in size, without focal abnormality. Adrenals/Urinary Tract: Left adrenal nodule of 2.5 cm is similar in size, likely an adenoma based on washout characteristics. Normal kidneys, without hydronephrosis. Normal urinary bladder. Stomach/Bowel: Normal stomach, without wall thickening. Normal colon, appendix, and terminal ileum. Normal small bowel. Vascular/Lymphatic: Aortic atherosclerosis. No abdominopelvic adenopathy. Reproductive: Normal uterus and adnexa. Other: No significant free fluid. No evidence of omental or peritoneal disease. Musculoskeletal: No acute osseous abnormality. Mild S shaped thoracolumbar spine curvature. IMPRESSION: 1. New and progressive adenopathy within the mediastinum and low left neck, consistent with nodal metastasis. 2. No evidence of pulmonary metastasis. Similar areas of probable scarring and pleural thickening. 3. No subdiaphragmatic metastatic disease identified. 4. Left adrenal adenoma. 5. Age advanced aortic atherosclerosis (ICD10-I70.0) and emphysema (ICD10-J43.9). Electronically Signed   By: Abigail Miyamoto M.D.   On:  08/10/2019 16:11   CT Angio Chest PE W and/or Wo Contrast  Result Date: 08/19/2019 CLINICAL DATA:  History of metastatic small cell lung cancer, shortness of breath EXAM: CT ANGIOGRAPHY CHEST WITH CONTRAST TECHNIQUE: Multidetector CT imaging of the chest was performed using the standard protocol during bolus administration of intravenous contrast. Multiplanar CT image reconstructions and MIPs were obtained to evaluate the vascular anatomy. CONTRAST:  154mL OMNIPAQUE IOHEXOL 350 MG/ML SOLN COMPARISON:  08/10/2019 FINDINGS: Cardiovascular: This is a technically adequate evaluation of the pulmonary vasculature. No filling defects or pulmonary emboli. The heart is unremarkable without pericardial effusion. No evidence of thoracic aortic aneurysm or dissection. Stable mild atherosclerosis. Mediastinum/Nodes: The left internal jugular chain lymph node seen on previous study is only partially visualized on this exam, measuring 8 mm in short axis. 13 mm short axis AP window lymph node image 43/4 unchanged. 7 mm right paratracheal lymph node image 29/4 unchanged. No new adenopathy. Thyroid, trachea, and esophagus are unremarkable. Lungs/Pleura: Background emphysema is again noted. There is interlobular septal thickening, with new ground-glass airspace disease seen within the right upper and left lower lobes. Findings could reflect mild edema versus atypical infection. No effusion or pneumothorax. The central airways are patent. Stable ovoid soft tissue within the left major fissure. Upper Abdomen: Stable 3 cm left adrenal nodule. The remainder of the upper abdomen is unremarkable. Musculoskeletal: No acute or destructive bony lesions. Reconstructed images demonstrate no additional findings. Review of the MIP images confirms the above findings. IMPRESSION: 1. No evidence of pulmonary embolus. 2. Stable nodal metastasis within the mediastinum and left internal jugular chain. 3. Interval development of mild interlobular  septal thickening and ground-glass opacity, greatest in the right upper lobe and left lower lobe. Differential includes asymmetric edema versus atypical infection. 4. Aortic Atherosclerosis (ICD10-I70.0) and Emphysema (ICD10-J43.9). Electronically Signed   By: Randa Ngo M.D.   On: 08/19/2019 19:35   MR BRAIN W WO CONTRAST  Result Date: 08/25/2019 CLINICAL DATA:  Brain/CNS neoplasm, assess treatment response. EXAM: MRI HEAD WITHOUT AND WITH CONTRAST TECHNIQUE: Multiplanar, multiecho pulse sequences of the brain and surrounding structures were obtained without and  with intravenous contrast. CONTRAST:  67mL GADAVIST GADOBUTROL 1 MMOL/ML IV SOLN COMPARISON:  07/13/2019 MRI brain. FINDINGS: Brain: Increased size of 4.0 x 3.2 cm enhancing centrally necrotic right frontal lesion (19:103, previously 2.6 x 2.0 cm). The anterior margin of the lesion demonstrates decreased enhancement. Increased perilesional edema. Intralesional foci of restricted diffusion are less conspicuous than prior exam. Increased intralesional SWI signal dropout with correlative punctate T1 hyperintense foci reflect hemorrhage. Mild dural thickening and enhancement overlying the right frontal convexity is more conspicuous than prior exam. No new enhancing lesion. No midline shift, ventriculomegaly or extra-axial fluid collection. Nonspecific background scattered T2 hyperintense supratentorial white matter foci are unchanged. Vascular: Normal flow voids. Skull and upper cervical spine: Normal marrow signal. No focal osseous lesion. Sinuses/Orbits: Normal orbits. Clear paranasal sinuses. Trace bilateral mastoid effusions. Other: None. IMPRESSION: Increased size of centrally necrotic enhancing right frontal lesion measuring 4.0 cm, previously 2.6 cm. White matter edema and dural thickening/enhancement involving the right frontal convexity is more conspicuous than prior exam. No new enhancing lesion. Electronically Signed   By: Primitivo Gauze  M.D.   On: 08/25/2019 16:52   CT Abdomen Pelvis W Contrast  Result Date: 08/10/2019 CLINICAL DATA:  Small-cell lung cancer diagnosed 7/20. Chemotherapy and radiation therapy complete. Chest pain. Mid and lower back pain. Cough. Shortness of breath. Nausea and vomiting. EXAM: CT CHEST, ABDOMEN, AND PELVIS WITH CONTRAST TECHNIQUE: Multidetector CT imaging of the chest, abdomen and pelvis was performed following the standard protocol during bolus administration of intravenous contrast. CONTRAST:  122mL OMNIPAQUE IOHEXOL 300 MG/ML  SOLN COMPARISON:  05/17/2019 FINDINGS: CT CHEST FINDINGS Cardiovascular: Aortic and branch vessel atherosclerosis. Mild cardiomegaly, without pericardial effusion. No central pulmonary embolism, on this non-dedicated study. Mediastinum/Nodes: A left low jugular/supraclavicular node measures 7 mm on 02/02 and is new. New or significantly enlarged right paratracheal node of 7 mm on 16/2. Centrally necrotic AP window node measures 1.3 cm on 22/2 versus 9 mm on the prior No hilar adenopathy. Lungs/Pleura: No pleural fluid. Developing subsegmental atelectasis at the right lung base laterally. Paraseptal emphysema. Slight decrease in trace left apical pleural thickening including on coronal image 105. Similar soft tissue thickening along the lateral aspect of the AP window, including on 20/2. This dependent posterior left upper lobe opacity is similar on 43/7, favored to represent subsegmental atelectasis. Example at 6 mm. Ovoid soft tissue thickening along the left major fissure is similar on 56/7. Musculoskeletal: No acute osseous abnormality. CT ABDOMEN PELVIS FINDINGS Hepatobiliary: Normal liver. Normal gallbladder, without biliary ductal dilatation. Pancreas: Normal, without mass or ductal dilatation. Spleen: Normal in size, without focal abnormality. Adrenals/Urinary Tract: Left adrenal nodule of 2.5 cm is similar in size, likely an adenoma based on washout characteristics. Normal  kidneys, without hydronephrosis. Normal urinary bladder. Stomach/Bowel: Normal stomach, without wall thickening. Normal colon, appendix, and terminal ileum. Normal small bowel. Vascular/Lymphatic: Aortic atherosclerosis. No abdominopelvic adenopathy. Reproductive: Normal uterus and adnexa. Other: No significant free fluid. No evidence of omental or peritoneal disease. Musculoskeletal: No acute osseous abnormality. Mild S shaped thoracolumbar spine curvature. IMPRESSION: 1. New and progressive adenopathy within the mediastinum and low left neck, consistent with nodal metastasis. 2. No evidence of pulmonary metastasis. Similar areas of probable scarring and pleural thickening. 3. No subdiaphragmatic metastatic disease identified. 4. Left adrenal adenoma. 5. Age advanced aortic atherosclerosis (ICD10-I70.0) and emphysema (ICD10-J43.9). Electronically Signed   By: Abigail Miyamoto M.D.   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 She requires durable venous access for chemo/immunotherapy. EXAM: IMPLANTED PORT A CATH PLACEMENT WITH ULTRASOUND AND FLUOROSCOPIC GUIDANCE MEDICATIONS: 900 mg clindamycin; The antibiotic was administered within an appropriate time interval prior to skin puncture. ANESTHESIA/SEDATION: Versed 4 mg IV; Fentanyl 100 mcg IV; Moderate Sedation Time:  18 minutes The patient was continuously monitored during the procedure by the interventional radiology nurse under my direct supervision. FLUOROSCOPY TIME:  0 minutes, 12 seconds (2 mGy) COMPLICATIONS: None immediate. PROCEDURE: The right neck and chest was prepped with chlorhexidine, and draped in the usual sterile fashion using maximum barrier technique (cap and mask, sterile gown, sterile gloves, large sterile sheet, hand hygiene and cutaneous antiseptic). Local anesthesia was attained by infiltration with 1% lidocaine with epinephrine. Ultrasound  demonstrated patency of the right internal jugular vein, and this was documented with an image. Under real-time ultrasound guidance, this vein was accessed with a 21 gauge micropuncture needle and image documentation was performed. A small dermatotomy was made at the access site with an 11 scalpel. A 0.018" wire was advanced into the SVC and the access needle exchanged for a 91F micropuncture vascular sheath. The 0.018" wire was then removed and a 0.035" wire advanced into the IVC. An appropriate location for the subcutaneous reservoir was selected below the clavicle and an incision was made through the skin and underlying soft tissues. The subcutaneous tissues were then dissected using a combination of blunt and sharp surgical technique and a pocket was formed. A single lumen power injectable portacatheter was then tunneled through the subcutaneous tissues from the pocket to the dermatotomy and the port reservoir placed within the subcutaneous pocket. The venous access site was then serially dilated and a peel away vascular sheath placed over the wire. The wire was removed and the port catheter advanced into position under fluoroscopic guidance. The catheter tip is positioned in the superior cavoatrial junction. This was documented with a spot image. The portacatheter was then tested and found to flush and aspirate well. The port was flushed with saline followed by 100 units/mL heparinized saline. The pocket was then closed in two layers using first subdermal inverted interrupted absorbable sutures followed by a running subcuticular suture. The epidermis was then sealed with Dermabond. The dermatotomy at the venous access site was also closed with Dermabond. IMPRESSION: Successful placement of a right IJ approach Power Port with ultrasound and fluoroscopic guidance. The catheter is ready for use. Electronically Signed   By: Jacqulynn Cadet M.D.   On: 09/02/2019 08:26      Assessment/Plan Brain metastases  [facility]) [C79.31]  Ms. Maturin presents today with frank progression of treated right frontal metastasis.  Degree of volume change is not entirely consistent with either radio-infalmmatory effect from WBRT or organic progression of disease.  Regardless, it is in a location amenable to gross total resection with growth refractory to corticosteroids.  She is agreeable to craniotomy, resection.  We will coordinate a consultation with [facility], per brain/spine tumor board discussion this week.  Dr. Burr Medico ok with holding off on systemic therapy until surgery is completed and she is healed, which will likely require ~1 month post-op period.  We appreciate the opportunity to participate in the care of Deah Ottaway.  Will follow up with her once surgery is completed for pathology review.   All questions were answered. The patient knows to call the clinic with any problems, questions or concerns. No barriers to learning were detected.  The total  time spent in the encounter was 40 minutes and more than 50% was on counseling and review of test results   Ventura Sellers, MD Medical Director of Neuro-Oncology [facility] 09/02/19 12:31 PM

## 2019-09-05 ENCOUNTER — Inpatient Hospital Stay: Payer: Medicaid Other

## 2019-09-06 ENCOUNTER — Inpatient Hospital Stay: Payer: Medicaid Other

## 2019-09-07 ENCOUNTER — Inpatient Hospital Stay: Payer: Medicaid Other

## 2019-09-12 MED ORDER — LIDOCAINE 5 % EX PTCH: MEDICATED_PATCH | CUTANEOUS | 0 refills | Status: DC

## 2019-09-12 NOTE — Progress Notes (Signed)
Palliative Medicine RN Note: Rec'd prior auth for Lidocaine patches 5% qty 30, J Code B3435 for PA request number [id number].   Armstead Peaks previously rec'd denial [id number]]  Marjie Skiff. Jonaya Freshour, RN, BSN, [facility] Palliative Medicine Team 09/12/2019 3:12 PM Office [phone]

## 2019-09-12 NOTE — Progress Notes (Addendum)
Palliative Care Outpatient Follow-up  I connected with  Marissa [name] on 05/11/20 by a video enabled telemedicine application and verified that I am speaking with the correct person using two identifiers.   I discussed the limitations of evaluation and management by telemedicine. The patient expressed understanding and agreed to proceed.  Patient contacted by phone in follow-up re: issues with ongoing HA and needs for additional supportive care.   Vernita described her HA as pain that is superficial and starts behind her ear and radiates/moves to the area around her temple. The pain can be burning at times. She also states that the pain gets worse when she is on her phone or computer for long perioids of time. I suspect some of this is related to occipital neuralgia and having her head in a strained position using her devices. I recommended raising her viewing screen and taking frequent breaks from screen time. She is on anti-inflammatory medication so I recommended icing the area as needed for relief. We can also try a lidoderm patch to the post-auricular area to see if this provides any relief-if this is an ongoing issue we can do a diagnostic potentially therapuetic block.  In terms of global HA and symptoms related to brain mets she tells me the plan is for her to have resection of her brain mets. She is understandably having increased anxiety regarding this upcoming surgery. I have provided encouragement and support and am happy to see her in the hospital to help with her symptom management and other needs.  At this time will make no changes to her current medications-will let her get through upcoming surgery and reassess where things stand with her symptom management.   Lane Hacker, DO Palliative Medicine   Time: 25 minutes Greater than 50%  of this time was spent counseling and coordinating care related to the above assessment and plan.

## 2019-09-14 ENCOUNTER — Other Ambulatory Visit: Payer: Self-pay | Admitting: Neurosurgery

## 2019-09-15 ENCOUNTER — Other Ambulatory Visit: Payer: Self-pay

## 2019-09-15 ENCOUNTER — Encounter (HOSPITAL_COMMUNITY): Payer: Self-pay | Admitting: Neurosurgery

## 2019-09-15 NOTE — Progress Notes (Signed)
Patient was given pre-op instructions over the phone. The opportunity was given for the patient to ask questions. No further questions asked. Patient verbalized understanding of instructions given.   Nothing to eat or drink after midnight.  7 days prior to surgery STOP taking any Aspirin (unless otherwise instructed by your surgeon), Aleve, Naproxen, Ibuprofen, Motrin, Advil, Goody's, BC's, all herbal medications, fish oil, and all vitamins.   Anesthesia Review Needed: yes hx crainotomy

## 2019-09-16 ENCOUNTER — Other Ambulatory Visit (HOSPITAL_COMMUNITY): Payer: Medicaid Other | Attending: Neurosurgery

## 2019-09-16 ENCOUNTER — Encounter (HOSPITAL_COMMUNITY): Payer: Self-pay | Admitting: Neurosurgery

## 2019-09-16 NOTE — Progress Notes (Signed)
Anesthesia Chart Review: Marissa [name]   Case: [id number] Date/Time: 09/19/19 1354   Procedures:      CRANIOTOMY TUMOR EXCISION, FRONTAL (Right )     APPLICATION OF CRANIAL NAVIGATION (Right )   Anesthesia type: General   Pre-op diagnosis: BRAIN TUMOR   Location: [facility] OR ROOM 21 / [place] [facility]   Surgeons: Consuella Lose, MD      DISCUSSION: Patient is a 54 year old female scheduled for the above procedure. She was diagnosed with small cell left lung cancer in 07/2018 and found to have right frontal brain mets 02/11/19.   History includes smoking, asthma, COPD, small cell left lung cancer (admitted with dyspnea 07/29/18 and found to have LUL mass; s/p bronchoscopy/EBUS 9/41/74 complicated by left pneumothorax treated with chest tube and diagnosed with stage III small cell lung cancer; s/p chemotherapy 09/07/18-11/10/18; left thoracentesis 09/14/18; radiation 09/28/18-12/09/18; diagnosed with right frontal lobe brain mets 02/11/19, s/p radiation 02/28/19-03/14/19, chemotherapy planned following craniotomy), chronic headaches, murmur (not specified, "no murmurs" documented 09/03/18 note by Dr. Burr Medico). S/p right IJ Power Port 09/01/19.   Per 09/02/19 note by neuro-oncologist Dr. Mickeal Skinner, patient had had " frank progression of treated right frontal metastasis.  Degree of volume change is not entirely consistent with either radio-infalmmatory effect from WBRT or organic progression of disease.  Regardless, it is in a location amenable to gross total resection with growth refractory to corticosteroids. She is agreeable to craniotomy, resection...Dr. Burr Medico ok with holding off on systemic therapy until surgery is completed and she is healed, which will likely require ~1 month post-op period."  Presurgical COVID-19 test is scheduled for 09/16/2019.  She is a same-day work-up, so labs and anesthesia team evaluation on the day of surgery.   VS: LMP 04/24/2012   BP Readings from Last 3 Encounters:  09/02/19 (!) 107/57  09/01/19  (!) 118/51  08/19/19 124/66   Pulse Readings from Last 3 Encounters:  09/02/19 90  09/01/19 93  08/19/19 85    PROVIDERS: [name], MD is Lawrence Santiago, MD is Para Skeans, MD is RAD-ONC Lane Hacker, DO is Palliative Care Margaretha Seeds, MD is pulmonologist   LABS: For day of surgery. Currently, last results include: Lab Results  Component Value Date   WBC 6.9 09/01/2019   HGB 14.3 09/01/2019   HCT 44.7 09/01/2019   PLT 300 09/01/2019   GLUCOSE 120 (H) 08/19/2019   ALT 28 08/18/2019   AST 19 08/18/2019   NA 142 08/19/2019   K 4.0 08/19/2019   CL 109 08/19/2019   CREATININE 0.75 08/19/2019   BUN 28 (H) 08/19/2019   CO2 22 08/19/2019   INR 0.9 09/01/2019    IMAGES: MRI Brain 08/25/19: IMPRESSION: - Increased size of centrally necrotic enhancing right frontal lesion measuring 4.0 cm, previously 2.6 cm. - White matter edema and dural thickening/enhancement involving the right frontal convexity is more conspicuous than prior exam. - No new enhancing lesion.  CTA Chest 08/19/19: IMPRESSION: 1. No evidence of pulmonary embolus. 2. Stable nodal metastasis within the mediastinum and left internal jugular chain. 3. Interval development of mild interlobular septal thickening and ground-glass opacity, greatest in the right upper lobe and left lower lobe. Differential includes asymmetric edema versus atypical infection. 4. Aortic Atherosclerosis (ICD10-I70.0) and Emphysema (ICD10-J43.9).   EKG: 08/19/19 (done in ED for evaluation of SOB and atypical chest pain in setting of stage IV lung cancer; No PE, question focal edema verus infection on imaging, treated with Levaquin, HS  Troponin negative x2): SR. Probable LAE. Anterior infarct (cited on or before 03/06/13). Minimal ST elevation, inferior leads.     CV: N/A   Past Medical History:  Diagnosis Date  . Asthma   . Chronic headaches   . COPD (chronic obstructive pulmonary disease) ([place])    . Depression   . Dyspnea    hospitalized July 2021  . Heart murmur   . Small cell lung cancer [facility])     Past Surgical History:  Procedure Laterality Date  . BRONCHIAL BIOPSY  08/24/2018   Procedure: BRONCHIAL BIOPSIES;  Surgeon: Margaretha Seeds, MD;  Location: [facility];  Service: Cardiopulmonary;;  . BRONCHIAL BRUSHINGS  08/24/2018   Procedure: BRONCHIAL BRUSHINGS;  Surgeon: Margaretha Seeds, MD;  Location: [facility];  Service: Cardiopulmonary;;  . BRONCHIAL NEEDLE ASPIRATION BIOPSY  08/24/2018   Procedure: BRONCHIAL NEEDLE ASPIRATION BIOPSIES;  Surgeon: Margaretha Seeds, MD;  Location: [facility];  Service: Cardiopulmonary;;  . ENDOBRONCHIAL ULTRASOUND N/A 08/24/2018   Procedure: ENDOBRONCHIAL ULTRASOUND;  Surgeon: Margaretha Seeds, MD;  Location: [facility];  Service: Cardiopulmonary;  Laterality: N/A;  . IR IMAGING GUIDED PORT INSERTION  09/01/2019  . IR THORACENTESIS ASP PLEURAL SPACE W/IMG GUIDE  09/14/2018  . TUBAL LIGATION    . VIDEO BRONCHOSCOPY  08/24/2018   Procedure: VIDEO BRONCHOSCOPY;  Surgeon: Margaretha Seeds, MD;  Location: [facility];  Service: Cardiopulmonary;;    MEDICATIONS: No current facility-administered medications for this encounter.   . Oxycodone HCl 10 MG TABS  . baclofen (LIORESAL) 10 MG tablet  . dexamethasone (DECADRON) 2 MG tablet  . diazepam (VALIUM) 10 MG tablet  . dronabinol (MARINOL) 2.5 MG capsule  . dronabinol (MARINOL) 5 MG capsule  . DULoxetine (CYMBALTA) 20 MG capsule  . gabapentin (NEURONTIN) 300 MG capsule  . lidocaine (LIDODERM) 5 %  . LORazepam (ATIVAN) 1 MG tablet  . prochlorperazine (COMPAZINE) 10 MG tablet  . promethazine (PHENERGAN) 25 MG tablet    Myra Gianotti, PA-C Surgical Short Stay/Anesthesiology [facility] Phone [phone] [facility] Phone [phone] 09/16/2019 9:49 AM

## 2019-09-16 NOTE — Anesthesia Preprocedure Evaluation (Addendum)
Anesthesia Evaluation  Patient identified by MRN, date of birth, ID band Patient awake    Reviewed: Allergy & Precautions, NPO status , Patient's Chart, lab work & pertinent test results  Airway Mallampati: II  TM Distance: >3 FB Neck ROM: Full    Dental  (+) Dental Advisory Given, Poor Dentition, Missing, Chipped   Pulmonary shortness of breath, asthma , COPD, Current Smoker,    Pulmonary exam normal breath sounds clear to auscultation       Cardiovascular Normal cardiovascular exam Rhythm:Regular Rate:Normal     Neuro/Psych  Headaches, PSYCHIATRIC DISORDERS Depression    GI/Hepatic negative GI ROS, Neg liver ROS,   Endo/Other  negative endocrine ROS  Renal/GU negative Renal ROS     Musculoskeletal negative musculoskeletal ROS (+)   Abdominal   Peds  Hematology  (+) Blood dyscrasia, anemia ,   Anesthesia Other Findings   Reproductive/Obstetrics                           Anesthesia Physical Anesthesia Plan  ASA: III  Anesthesia Plan: General   Post-op Pain Management:    Induction: Intravenous  PONV Risk Score and Plan: 2 and Ondansetron, Dexamethasone and Treatment may vary due to age or medical condition  Airway Management Planned: Oral ETT  Additional Equipment: Arterial line  Intra-op Plan:   Post-operative Plan: Possible Post-op intubation/ventilation  Informed Consent: I have reviewed the patients History and Physical, chart, labs and discussed the procedure including the risks, benefits and alternatives for the proposed anesthesia with the patient or authorized representative who has indicated his/her understanding and acceptance.     Dental advisory given  Plan Discussed with: CRNA  Anesthesia Plan Comments: (2 x PIV, remifentanil gtt    PAT note written 09/16/2019 by Myra Gianotti, PA-C. )      Anesthesia Quick Evaluation

## 2019-09-19 ENCOUNTER — Other Ambulatory Visit: Payer: Self-pay

## 2019-09-19 ENCOUNTER — Inpatient Hospital Stay (HOSPITAL_COMMUNITY): Payer: Medicaid Other | Admitting: Vascular Surgery

## 2019-09-19 ENCOUNTER — Inpatient Hospital Stay (HOSPITAL_COMMUNITY)
Admission: RE | Admit: 2019-09-19 | Discharge: 2019-09-21 | DRG: 025 | Disposition: A | Discharge status: Home / Self Care | Payer: Medicaid Other | Attending: Neurosurgery | Admitting: Neurosurgery

## 2019-09-19 ENCOUNTER — Encounter (HOSPITAL_COMMUNITY)
Admission: RE | Disposition: A | Discharge status: Home / Self Care | Payer: Self-pay | Source: Home / Self Care | Attending: Neurosurgery

## 2019-09-19 ENCOUNTER — Encounter (HOSPITAL_COMMUNITY): Payer: Self-pay | Admitting: Neurosurgery

## 2019-09-19 ENCOUNTER — Inpatient Hospital Stay (HOSPITAL_COMMUNITY): Payer: Medicaid Other

## 2019-09-19 DIAGNOSIS — D496 Neoplasm of unspecified behavior of brain: Secondary | ICD-10-CM | POA: Diagnosis present

## 2019-09-19 DIAGNOSIS — Z20822 Contact with and (suspected) exposure to covid-19: Secondary | ICD-10-CM | POA: Diagnosis present

## 2019-09-19 DIAGNOSIS — J449 Chronic obstructive pulmonary disease, unspecified: Secondary | ICD-10-CM | POA: Diagnosis present

## 2019-09-19 DIAGNOSIS — F329 Major depressive disorder, single episode, unspecified: Secondary | ICD-10-CM | POA: Diagnosis present

## 2019-09-19 DIAGNOSIS — C7931 Secondary malignant neoplasm of brain: Secondary | ICD-10-CM | POA: Diagnosis present

## 2019-09-19 DIAGNOSIS — C3412 Malignant neoplasm of upper lobe, left bronchus or lung: Secondary | ICD-10-CM | POA: Diagnosis present

## 2019-09-19 DIAGNOSIS — F17210 Nicotine dependence, cigarettes, uncomplicated: Secondary | ICD-10-CM | POA: Diagnosis present

## 2019-09-19 DIAGNOSIS — Z880 Allergy status to penicillin: Secondary | ICD-10-CM | POA: Diagnosis not present

## 2019-09-19 DIAGNOSIS — Z923 Personal history of irradiation: Secondary | ICD-10-CM

## 2019-09-19 DIAGNOSIS — Z79899 Other long term (current) drug therapy: Secondary | ICD-10-CM

## 2019-09-19 DIAGNOSIS — G936 Cerebral edema: Secondary | ICD-10-CM | POA: Diagnosis not present

## 2019-09-19 HISTORY — PX: APPLICATION OF CRANIAL NAVIGATION: SHX6578

## 2019-09-19 HISTORY — PX: CRANIOTOMY: SHX93

## 2019-09-19 LAB — TYPE AND SCREEN
ABO/RH(D): O POS
Antibody Screen: NEGATIVE

## 2019-09-19 LAB — CBC
HCT: 42 % (ref 36.0–46.0)
Hemoglobin: 13.2 g/dL (ref 12.0–15.0)
MCH: 29.8 pg (ref 26.0–34.0)
MCHC: 31.4 g/dL (ref 30.0–36.0)
MCV: 94.8 fL (ref 80.0–100.0)
Platelets: 305 10*3/uL (ref 150–400)
RBC: 4.43 MIL/uL (ref 3.87–5.11)
RDW: 14.2 % (ref 11.5–15.5)
WBC: 7 10*3/uL (ref 4.0–10.5)
nRBC: 0 % (ref 0.0–0.2)

## 2019-09-19 LAB — BASIC METABOLIC PANEL
Anion gap: 11 (ref 5–15)
BUN: 31 mg/dL — ABNORMAL HIGH (ref 6–20)
CO2: 24 mmol/L (ref 22–32)
Calcium: 10 mg/dL (ref 8.9–10.3)
Chloride: 108 mmol/L (ref 98–111)
Creatinine, Ser: 0.72 mg/dL (ref 0.44–1.00)
GFR calc Af Amer: >60 mL/min (ref >60)
GFR calc non Af Amer: >60 mL/min (ref >60)
Glucose, Bld: 118 mg/dL — ABNORMAL HIGH (ref 70–99)
Potassium: 3.4 mmol/L — ABNORMAL LOW (ref 3.5–5.1)
Sodium: 143 mmol/L (ref 135–145)

## 2019-09-19 LAB — SARS CORONAVIRUS 2 BY RT PCR (HOSPITAL ORDER, PERFORMED IN ~~LOC~~ HOSPITAL LAB): SARS Coronavirus 2: NEGATIVE

## 2019-09-19 LAB — MRSA PCR SCREENING: MRSA by PCR: NEGATIVE

## 2019-09-19 SURGERY — CRANIOTOMY TUMOR EXCISION
Anesthesia: General | Site: Head | Laterality: Right

## 2019-09-19 MED ORDER — PROPOFOL 10 MG/ML IV BOLUS
INTRAVENOUS | Status: AC
  Filled 2019-09-19: qty 20

## 2019-09-19 MED ORDER — LEVETIRACETAM IN NACL 500 MG/100ML IV SOLN
500.0000 mg | Freq: Two times a day (BID) | INTRAVENOUS | Status: DC
  Administered 2019-09-19 – 2019-09-20 (×2): 500 mg via INTRAVENOUS
  Filled 2019-09-19 (×2): qty 100

## 2019-09-19 MED ORDER — SODIUM CHLORIDE 0.9 % IV SOLN: INTRAVENOUS | Status: DC | PRN

## 2019-09-19 MED ORDER — ACETAMINOPHEN 325 MG PO TABS: 650.0000 mg | ORAL_TABLET | ORAL | Status: DC | PRN

## 2019-09-19 MED ORDER — LABETALOL HCL 5 MG/ML IV SOLN: 10.0000 mg | INTRAVENOUS | Status: DC | PRN

## 2019-09-19 MED ORDER — LIDOCAINE HCL (CARDIAC) PF 100 MG/5ML IV SOSY
PREFILLED_SYRINGE | INTRAVENOUS | Status: DC | PRN
  Administered 2019-09-19: 100 mg via INTRAVENOUS

## 2019-09-19 MED ORDER — SENNOSIDES-DOCUSATE SODIUM 8.6-50 MG PO TABS: 1.0000 | ORAL_TABLET | Freq: Every evening | ORAL | Status: DC | PRN

## 2019-09-19 MED ORDER — LORAZEPAM 1 MG PO TABS
1.0000 mg | ORAL_TABLET | Freq: Two times a day (BID) | ORAL | Status: DC | PRN
  Administered 2019-09-19: 1 mg via ORAL
  Filled 2019-09-19: qty 1

## 2019-09-19 MED ORDER — LIDOCAINE-EPINEPHRINE 1 %-1:100000 IJ SOLN
INTRAMUSCULAR | Status: AC
  Filled 2019-09-19: qty 1

## 2019-09-19 MED ORDER — BUPIVACAINE HCL (PF) 0.5 % IJ SOLN
INTRAMUSCULAR | Status: AC
  Filled 2019-09-19: qty 30

## 2019-09-19 MED ORDER — ORAL CARE MOUTH RINSE: 15.0000 mL | Freq: Once | OROMUCOSAL | Status: AC

## 2019-09-19 MED ORDER — BACITRACIN ZINC 500 UNIT/GM EX OINT
TOPICAL_OINTMENT | CUTANEOUS | Status: AC
  Filled 2019-09-19: qty 28.35

## 2019-09-19 MED ORDER — SODIUM CHLORIDE 0.9 % IV SOLN: INTRAVENOUS | Status: DC

## 2019-09-19 MED ORDER — LACTATED RINGERS IV SOLN: INTRAVENOUS | Status: DC

## 2019-09-19 MED ORDER — PANTOPRAZOLE SODIUM 40 MG IV SOLR
40.0000 mg | Freq: Every day | INTRAVENOUS | Status: DC
  Administered 2019-09-19: 40 mg via INTRAVENOUS
  Filled 2019-09-19: qty 40

## 2019-09-19 MED ORDER — THROMBIN 5000 UNITS EX SOLR
OROMUCOSAL | Status: DC | PRN
  Administered 2019-09-19: 5 mL

## 2019-09-19 MED ORDER — ROCURONIUM BROMIDE 10 MG/ML (PF) SYRINGE
PREFILLED_SYRINGE | INTRAVENOUS | Status: DC | PRN
  Administered 2019-09-19 (×2): 20 mg via INTRAVENOUS
  Administered 2019-09-19: 40 mg via INTRAVENOUS

## 2019-09-19 MED ORDER — THROMBIN 20000 UNITS EX SOLR
CUTANEOUS | Status: AC
  Filled 2019-09-19: qty 20000

## 2019-09-19 MED ORDER — CHLORHEXIDINE GLUCONATE CLOTH 2 % EX PADS: 6.0000 | MEDICATED_PAD | Freq: Once | CUTANEOUS | Status: DC

## 2019-09-19 MED ORDER — DRONABINOL 2.5 MG PO CAPS
2.5000 mg | ORAL_CAPSULE | Freq: Two times a day (BID) | ORAL | Status: DC
  Administered 2019-09-20 – 2019-09-21 (×3): 2.5 mg via ORAL
  Filled 2019-09-19 (×3): qty 1

## 2019-09-19 MED ORDER — LIDOCAINE 5 % EX PTCH
1.0000 | MEDICATED_PATCH | CUTANEOUS | Status: DC
  Administered 2019-09-19 – 2019-09-20 (×2): 1 via TRANSDERMAL
  Filled 2019-09-19 (×3): qty 1

## 2019-09-19 MED ORDER — DEXAMETHASONE SODIUM PHOSPHATE 10 MG/ML IJ SOLN
INTRAMUSCULAR | Status: DC | PRN
  Administered 2019-09-19: 10 mg via INTRAVENOUS

## 2019-09-19 MED ORDER — BISACODYL 10 MG RE SUPP: 10.0000 mg | Freq: Every day | RECTAL | Status: DC | PRN

## 2019-09-19 MED ORDER — LIDOCAINE-EPINEPHRINE 1 %-1:100000 IJ SOLN
INTRAMUSCULAR | Status: DC | PRN
  Administered 2019-09-19: 5 mL

## 2019-09-19 MED ORDER — BUPIVACAINE HCL (PF) 0.5 % IJ SOLN
INTRAMUSCULAR | Status: DC | PRN
  Administered 2019-09-19: 5 mL

## 2019-09-19 MED ORDER — DEXAMETHASONE SODIUM PHOSPHATE 10 MG/ML IJ SOLN
6.0000 mg | Freq: Four times a day (QID) | INTRAMUSCULAR | Status: AC
  Administered 2019-09-19 – 2019-09-20 (×4): 6 mg via INTRAVENOUS
  Filled 2019-09-19 (×4): qty 1

## 2019-09-19 MED ORDER — SODIUM CHLORIDE 0.9 % IV SOLN
INTRAVENOUS | Status: DC | PRN
  Administered 2019-09-19: 500 mL

## 2019-09-19 MED ORDER — GABAPENTIN 300 MG PO CAPS
300.0000 mg | ORAL_CAPSULE | Freq: Three times a day (TID) | ORAL | Status: DC
  Administered 2019-09-19 – 2019-09-20 (×4): 300 mg via ORAL
  Filled 2019-09-19 (×5): qty 1

## 2019-09-19 MED ORDER — BACITRACIN ZINC 500 UNIT/GM EX OINT
TOPICAL_OINTMENT | CUTANEOUS | Status: DC | PRN
  Administered 2019-09-19: 1 via TOPICAL

## 2019-09-19 MED ORDER — ONDANSETRON HCL 4 MG/2ML IJ SOLN: 4.0000 mg | INTRAMUSCULAR | Status: DC | PRN

## 2019-09-19 MED ORDER — PROCHLORPERAZINE MALEATE 10 MG PO TABS
10.0000 mg | ORAL_TABLET | Freq: Four times a day (QID) | ORAL | Status: DC | PRN
  Filled 2019-09-19: qty 1

## 2019-09-19 MED ORDER — MIDAZOLAM HCL 2 MG/2ML IJ SOLN
INTRAMUSCULAR | Status: AC
  Filled 2019-09-19: qty 2

## 2019-09-19 MED ORDER — VANCOMYCIN HCL IN DEXTROSE 1-5 GM/200ML-% IV SOLN
1000.0000 mg | Freq: Two times a day (BID) | INTRAVENOUS | Status: AC
  Administered 2019-09-19 – 2019-09-20 (×2): 1000 mg via INTRAVENOUS
  Filled 2019-09-19 (×3): qty 200

## 2019-09-19 MED ORDER — PROPOFOL 10 MG/ML IV BOLUS
INTRAVENOUS | Status: DC | PRN
  Administered 2019-09-19: 140 mg via INTRAVENOUS

## 2019-09-19 MED ORDER — THROMBIN 20000 UNITS EX SOLR
CUTANEOUS | Status: DC | PRN
  Administered 2019-09-19: 20 mL

## 2019-09-19 MED ORDER — ONDANSETRON HCL 4 MG/2ML IJ SOLN
INTRAMUSCULAR | Status: DC | PRN
  Administered 2019-09-19: 4 mg via INTRAVENOUS

## 2019-09-19 MED ORDER — THROMBIN 5000 UNITS EX SOLR
CUTANEOUS | Status: AC
  Filled 2019-09-19: qty 5000

## 2019-09-19 MED ORDER — REMIFENTANIL HCL 2 MG IV SOLR
INTRAVENOUS | Status: DC | PRN
  Administered 2019-09-19 (×2): .05 ug/kg/min via INTRAVENOUS

## 2019-09-19 MED ORDER — MIDAZOLAM HCL 5 MG/5ML IJ SOLN
INTRAMUSCULAR | Status: DC | PRN
  Administered 2019-09-19: 2 mg via INTRAVENOUS

## 2019-09-19 MED ORDER — HYDROCODONE-ACETAMINOPHEN 5-325 MG PO TABS
1.0000 | ORAL_TABLET | ORAL | Status: DC | PRN
  Administered 2019-09-19 – 2019-09-21 (×6): 1 via ORAL
  Filled 2019-09-19 (×6): qty 1

## 2019-09-19 MED ORDER — VANCOMYCIN HCL IN DEXTROSE 1-5 GM/200ML-% IV SOLN
1000.0000 mg | INTRAVENOUS | Status: AC
  Administered 2019-09-19: 1000 mg via INTRAVENOUS
  Filled 2019-09-19: qty 200

## 2019-09-19 MED ORDER — EPHEDRINE SULFATE 50 MG/ML IJ SOLN
INTRAMUSCULAR | Status: DC | PRN
  Administered 2019-09-19: 15 mg via INTRAVENOUS

## 2019-09-19 MED ORDER — SUCCINYLCHOLINE CHLORIDE 20 MG/ML IJ SOLN
INTRAMUSCULAR | Status: DC | PRN
  Administered 2019-09-19: 140 mg via INTRAVENOUS

## 2019-09-19 MED ORDER — LABETALOL HCL 5 MG/ML IV SOLN
INTRAVENOUS | Status: DC | PRN
  Administered 2019-09-19: 5 mg via INTRAVENOUS

## 2019-09-19 MED ORDER — DEXAMETHASONE SODIUM PHOSPHATE 4 MG/ML IJ SOLN: 4.0000 mg | Freq: Four times a day (QID) | INTRAMUSCULAR | Status: DC

## 2019-09-19 MED ORDER — DEXAMETHASONE SODIUM PHOSPHATE 4 MG/ML IJ SOLN: 4.0000 mg | Freq: Three times a day (TID) | INTRAMUSCULAR | Status: DC

## 2019-09-19 MED ORDER — SUGAMMADEX SODIUM 200 MG/2ML IV SOLN
INTRAVENOUS | Status: DC | PRN
  Administered 2019-09-19: 200 mg via INTRAVENOUS

## 2019-09-19 MED ORDER — FENTANYL CITRATE (PF) 250 MCG/5ML IJ SOLN
INTRAMUSCULAR | Status: AC
  Filled 2019-09-19: qty 5

## 2019-09-19 MED ORDER — CHLORHEXIDINE GLUCONATE 0.12 % MT SOLN
15.0000 mL | Freq: Once | OROMUCOSAL | Status: AC
  Administered 2019-09-19: 15 mL via OROMUCOSAL
  Filled 2019-09-19: qty 15

## 2019-09-19 MED ORDER — ONDANSETRON HCL 4 MG PO TABS: 4.0000 mg | ORAL_TABLET | ORAL | Status: DC | PRN

## 2019-09-19 MED ORDER — LEVETIRACETAM IN NACL 1000 MG/100ML IV SOLN
1000.0000 mg | Freq: Once | INTRAVENOUS | Status: AC
  Administered 2019-09-19: 1000 mg via INTRAVENOUS
  Filled 2019-09-19 (×2): qty 100

## 2019-09-19 MED ORDER — PHENYLEPHRINE HCL-NACL 10-0.9 MG/250ML-% IV SOLN
INTRAVENOUS | Status: DC | PRN
  Administered 2019-09-19: 25 ug/min via INTRAVENOUS

## 2019-09-19 MED ORDER — MORPHINE SULFATE (PF) 2 MG/ML IV SOLN
1.0000 mg | INTRAVENOUS | Status: DC | PRN
  Administered 2019-09-19 – 2019-09-20 (×3): 2 mg via INTRAVENOUS
  Administered 2019-09-20: 1 mg via INTRAVENOUS
  Filled 2019-09-19 (×4): qty 1

## 2019-09-19 MED ORDER — FENTANYL CITRATE (PF) 100 MCG/2ML IJ SOLN
INTRAMUSCULAR | Status: DC | PRN
Start: 2019-09-19 — End: 2019-09-19
  Administered 2019-09-19: 100 ug via INTRAVENOUS

## 2019-09-19 MED ORDER — PHENYLEPHRINE HCL (PRESSORS) 10 MG/ML IV SOLN
INTRAVENOUS | Status: DC | PRN
  Administered 2019-09-19: 100 ug via INTRAVENOUS

## 2019-09-19 MED ORDER — ACETAMINOPHEN 650 MG RE SUPP: 650.0000 mg | RECTAL | Status: DC | PRN

## 2019-09-19 MED ORDER — 0.9 % SODIUM CHLORIDE (POUR BTL) OPTIME
TOPICAL | Status: DC | PRN
  Administered 2019-09-19: 2000 mL
  Administered 2019-09-19: 1000 mL

## 2019-09-19 MED ORDER — SODIUM CHLORIDE 0.9 % IR SOLN
Status: DC | PRN
  Administered 2019-09-19: 1000 mL

## 2019-09-19 MED ORDER — SODIUM CHLORIDE 0.9 % IV SOLN
0.0125 ug/kg/min | INTRAVENOUS | Status: DC
  Filled 2019-09-19: qty 2000

## 2019-09-19 SURGICAL SUPPLY — 107 items
BAND RUBBER #18 3X1/16 STRL (MISCELLANEOUS) IMPLANT
BENZOIN TINCTURE PRP APPL 2/3 (GAUZE/BANDAGES/DRESSINGS) IMPLANT
BLADE CLIPPER SURG (BLADE) ×3 IMPLANT
BLADE SAW GIGLI 16 STRL (MISCELLANEOUS) IMPLANT
BLADE SURG 15 STRL LF DISP TIS (BLADE) IMPLANT
BLADE SURG 15 STRL SS (BLADE)
BLADE ULTRA TIP 2M (BLADE) ×3 IMPLANT
BNDG GAUZE ELAST 4 BULKY (GAUZE/BANDAGES/DRESSINGS) ×6 IMPLANT
BNDG STRETCH 4X75 NS LF (GAUZE/BANDAGES/DRESSINGS) ×3 IMPLANT
BNDG STRETCH 4X75 STRL LF (GAUZE/BANDAGES/DRESSINGS) IMPLANT
BUR ACORN 6.0 PRECISION (BURR) ×2 IMPLANT
BUR ACORN 6.0MM PRECISION (BURR) ×1
BUR ROUND FLUTED 4 SOFT TCH (BURR) IMPLANT
BUR ROUND FLUTED 4MM SOFT TCH (BURR)
BUR SPIRAL ROUTER 2.3 (BUR) ×2 IMPLANT
BUR SPIRAL ROUTER 2.3MM (BUR) ×1
CANISTER SUCT 3000ML PPV (MISCELLANEOUS) ×6 IMPLANT
CARTRIDGE OIL MAESTRO DRILL (MISCELLANEOUS) ×1 IMPLANT
CATH VENTRIC 35X38 W/TROCAR LG (CATHETERS) IMPLANT
CLIP VESOCCLUDE MED 6/CT (CLIP) IMPLANT
CNTNR URN SCR LID CUP LEK RST (MISCELLANEOUS) ×2 IMPLANT
CONT SPEC 4OZ STRL OR WHT (MISCELLANEOUS) ×6
COVER MAYO STAND STRL (DRAPES) IMPLANT
COVER WAND RF STERILE (DRAPES) ×3 IMPLANT
DECANTER SPIKE VIAL GLASS SM (MISCELLANEOUS) ×3 IMPLANT
DIFFUSER DRILL AIR PNEUMATIC (MISCELLANEOUS) ×3 IMPLANT
DRAIN SUBARACHNOID (WOUND CARE) IMPLANT
DRAPE HALF SHEET 40X57 (DRAPES) ×3 IMPLANT
DRAPE MICROSCOPE LEICA (MISCELLANEOUS) IMPLANT
DRAPE NEUROLOGICAL W/INCISE (DRAPES) ×3 IMPLANT
DRAPE STERI IOBAN 125X83 (DRAPES) IMPLANT
DRAPE SURG 17X23 STRL (DRAPES) IMPLANT
DRAPE WARM FLUID 44X44 (DRAPES) ×3 IMPLANT
DRSG ADAPTIC 3X8 NADH LF (GAUZE/BANDAGES/DRESSINGS) IMPLANT
DRSG TELFA 3X8 NADH (GAUZE/BANDAGES/DRESSINGS) ×3 IMPLANT
DURAPREP 6ML APPLICATOR 50/CS (WOUND CARE) ×3 IMPLANT
ELECT REM PT RETURN 9FT ADLT (ELECTROSURGICAL) ×3
ELECTRODE REM PT RTRN 9FT ADLT (ELECTROSURGICAL) ×1 IMPLANT
EVACUATOR 1/8 PVC DRAIN (DRAIN) IMPLANT
EVACUATOR SILICONE 100CC (DRAIN) IMPLANT
FORCEPS BIPOLAR SPETZLER 8 1.0 (NEUROSURGERY SUPPLIES) ×3 IMPLANT
GAUZE 4X4 16PLY RFD (DISPOSABLE) IMPLANT
GAUZE SPONGE 4X4 12PLY STRL (GAUZE/BANDAGES/DRESSINGS) ×3 IMPLANT
GLOVE BIO SURGEON STRL SZ7.5 (GLOVE) IMPLANT
GLOVE BIOGEL PI IND STRL 7.0 (GLOVE) IMPLANT
GLOVE BIOGEL PI IND STRL 7.5 (GLOVE) ×2 IMPLANT
GLOVE BIOGEL PI INDICATOR 7.0 (GLOVE)
GLOVE BIOGEL PI INDICATOR 7.5 (GLOVE) ×4
GLOVE ECLIPSE 7.0 STRL STRAW (GLOVE) ×6 IMPLANT
GLOVE EXAM NITRILE XL STR (GLOVE) IMPLANT
GOWN STRL REUS W/ TWL LRG LVL3 (GOWN DISPOSABLE) ×2 IMPLANT
GOWN STRL REUS W/ TWL XL LVL3 (GOWN DISPOSABLE) IMPLANT
GOWN STRL REUS W/TWL 2XL LVL3 (GOWN DISPOSABLE) IMPLANT
GOWN STRL REUS W/TWL LRG LVL3 (GOWN DISPOSABLE) ×6
GOWN STRL REUS W/TWL XL LVL3 (GOWN DISPOSABLE)
GRAFT DURAGEN MATRIX 3WX3L (Graft) ×3 IMPLANT
GRAFT DURAGEN MATRIX 3X3 SNGL (Graft) ×1 IMPLANT
HEMOSTAT POWDER KIT SURGIFOAM (HEMOSTASIS) ×3 IMPLANT
HEMOSTAT SURGICEL 2X14 (HEMOSTASIS) ×3 IMPLANT
HOOK DURA 1/2IN (MISCELLANEOUS) ×3 IMPLANT
IV NS 1000ML (IV SOLUTION) ×3
IV NS 1000ML BAXH (IV SOLUTION) ×1 IMPLANT
KIT BASIN OR (CUSTOM PROCEDURE TRAY) ×3 IMPLANT
KIT DRAIN CSF ACCUDRAIN (MISCELLANEOUS) IMPLANT
KIT TURNOVER KIT B (KITS) ×3 IMPLANT
KNIFE ARACHNOID DISP AM-24-S (MISCELLANEOUS) ×3 IMPLANT
MARKER SPHERE PSV REFLC 13MM (MARKER) ×6 IMPLANT
NEEDLE HYPO 22GX1.5 SAFETY (NEEDLE) ×3 IMPLANT
NEEDLE SPNL 18GX3.5 QUINCKE PK (NEEDLE) IMPLANT
NS IRRIG 1000ML POUR BTL (IV SOLUTION) ×9 IMPLANT
OIL CARTRIDGE MAESTRO DRILL (MISCELLANEOUS) ×3
PACK BATTERY PRO-DEX (ORTHOPEDIC DISPOSABLE SUPPLIES) ×3 IMPLANT
PACK CRANIOTOMY CUSTOM (CUSTOM PROCEDURE TRAY) ×3 IMPLANT
PATTIES SURGICAL .25X.25 (GAUZE/BANDAGES/DRESSINGS) IMPLANT
PATTIES SURGICAL .5 X.5 (GAUZE/BANDAGES/DRESSINGS) IMPLANT
PATTIES SURGICAL .5 X3 (DISPOSABLE) ×3 IMPLANT
PATTIES SURGICAL 1/4 X 3 (GAUZE/BANDAGES/DRESSINGS) IMPLANT
PATTIES SURGICAL 1X1 (DISPOSABLE) IMPLANT
PIN MAYFIELD SKULL DISP (PIN) ×3 IMPLANT
PLATE CRANIAL 12 2H RIGID UNI (Plate) ×3 IMPLANT
PLATE UNIV CMF 16 2H (Plate) ×3 IMPLANT
SCREW UNIII AXS SD 1.5X4 (Screw) ×18 IMPLANT
SET CARTRIDGE AND TUBING (SET/KITS/TRAYS/PACK) ×3 IMPLANT
SPECIMEN JAR SMALL (MISCELLANEOUS) IMPLANT
SPONGE NEURO XRAY DETECT 1X3 (DISPOSABLE) IMPLANT
SPONGE SURGIFOAM ABS GEL 100 (HEMOSTASIS) ×3 IMPLANT
STAPLER VISISTAT 35W (STAPLE) ×3 IMPLANT
STOCKINETTE 6  STRL (DRAPES)
STOCKINETTE 6 STRL (DRAPES) IMPLANT
SUT ETHILON 3 0 FSL (SUTURE) IMPLANT
SUT ETHILON 3 0 PS 1 (SUTURE) IMPLANT
SUT NURALON 4 0 TR CR/8 (SUTURE) ×9 IMPLANT
SUT SILK 0 TIES 10X30 (SUTURE) IMPLANT
SUT VIC AB 0 CT1 18XCR BRD8 (SUTURE) ×2 IMPLANT
SUT VIC AB 0 CT1 8-18 (SUTURE) ×6
SUT VIC AB 3-0 SH 8-18 (SUTURE) ×6 IMPLANT
TAPE CLOTH 1X10 TAN NS (GAUZE/BANDAGES/DRESSINGS) ×3 IMPLANT
TIP STANDARD 36KHZ (INSTRUMENTS) ×3
TIP STD 36KHZ (INSTRUMENTS) ×1 IMPLANT
TOWEL GREEN STERILE (TOWEL DISPOSABLE) ×3 IMPLANT
TOWEL GREEN STERILE FF (TOWEL DISPOSABLE) ×3 IMPLANT
TRAY FOLEY MTR SLVR 16FR STAT (SET/KITS/TRAYS/PACK) ×3 IMPLANT
TUBE CONNECTING 12'X1/4 (SUCTIONS) ×1
TUBE CONNECTING 12X1/4 (SUCTIONS) ×2 IMPLANT
UNDERPAD 30X36 HEAVY ABSORB (UNDERPADS AND DIAPERS) ×3 IMPLANT
WATER STERILE IRR 1000ML POUR (IV SOLUTION) ×3 IMPLANT
WRENCH TORQUE 36KHZ (INSTRUMENTS) ×3 IMPLANT

## 2019-09-19 NOTE — Anesthesia Procedure Notes (Signed)
Arterial Line Insertion Start/End8/23/2021 2:05 PM, 09/19/2019 2:21 PM Performed by: Clearnce Sorrel, CRNA, CRNA  Preanesthetic checklist: patient identified, risks and benefits discussed, monitors and equipment checked, pre-op evaluation and timeout performed Left, radial was placed Catheter size: 20 G  Attempts: 1 Procedure performed without using ultrasound guided technique. Following insertion, dressing applied and Biopatch. Post procedure assessment: normal  Patient tolerated the procedure well with no immediate complications.

## 2019-09-19 NOTE — Transfer of Care (Signed)
Immediate Anesthesia Transfer of Care Note  Patient: Marissa [name]  Procedure(s) Performed: CRANIOTOMY TUMOR EXCISION, FRONTAL (Right Head) APPLICATION OF CRANIAL NAVIGATION (Right Head)  Patient Location: PACU  Anesthesia Type:General  Level of Consciousness: awake, alert  and oriented  Airway & Oxygen Therapy: Patient Spontanous Breathing and Patient connected to nasal cannula oxygen  Post-op Assessment: Report given to RN and Post -op Vital signs reviewed and stable  Post vital signs: Reviewed and stable  Last Vitals:  Vitals Value Taken Time  BP 90/46 09/19/19 1811  Temp    Pulse 91 09/19/19 1818  Resp 22 09/19/19 1818  SpO2 94 % 09/19/19 1818  Vitals shown include unvalidated device data.  Last Pain:  Vitals:   09/19/19 1218  TempSrc: Oral         Complications: No complications documented.

## 2019-09-19 NOTE — Progress Notes (Signed)
Pharmacy Antibiotic Note  Marissa [name] is a 54 y.o. female admitted on 09/19/2019 with small cell lung cancer, R frontal tumor.  Pt is S/P R frontal craniotomy for resection of tumor this afternoon. Pharmacy has been consulted for vancomycin dosing for 24 hrs of surgical prophylaxis in this pt with hx of PCN allergy.  WBC 7.0, afebrile, Scr 0.72, CrCl 75.9 ml/min  Pt rec'd vancomycin 1 gm IV X 1 pre op at 1255 PM today.  Plan: Vancomycin 1 gm Q 12 hrs X 2 doses (will give first dose now, as estimated vancomycin level at this time following pre op dose is ~11 mg/L)  Height: 5\' 2"  (157.5 cm) Weight: 72.6 kg (160 lb) IBW/kg (Calculated) : 50.1  Temp (24hrs), Avg:98.2 F (36.8 C), Min:97.5 F (36.4 C), Max:98.5 F (36.9 C)  Recent Labs  Lab 09/19/19 1244  WBC 7.0  CREATININE 0.72    Estimated Creatinine Clearance: 75.9 mL/min (by C-G formula based on SCr of 0.72 mg/dL).    Allergies  Allergen Reactions  . Penicillins Nausea And Vomiting    Did it involve swelling of the face/tongue/throat, SOB, or low BP? No Did it involve sudden or severe rash/hives, skin peeling, or any reaction on the inside of your mouth or nose? No Did you need to seek medical attention at a hospital or doctor's office? No When did it last happen?20 years If all above answers are "NO", may proceed with cephalosporin use.    Microbiology results: 8/23 COVID: negative  Thank you for allowing pharmacy to be a part of this patient's care.  Gillermina Hu, PharmD, BCPS, [facility] Clinical Pharmacist 09/19/2019 8:17 PM

## 2019-09-19 NOTE — H&P (Signed)
Chief Complaint  Brain tumor  History of Present Illness  Marissa [name] is a 54 y.o. female initially seen in the outpatient neurosurgery clinic as a referral from her neuro oncologist.  Briefly, the patient has a history of small cell lung cancer and underwent staging MRI which demonstrated a small right frontal lesion.  Patient did undergo whole brain radiation treatment, however was noted to have slow but progressive enlargement over the last several months of this right frontal lesion.  Her case was discussed at the multidisciplinary neuro-oncology conference, unclear whether this enlargement represented radiation treatment effect versus progression of metastatic disease.  Surgical resection was therefore recommended.  Past Medical History   Past Medical History:  Diagnosis Date  . Asthma   . Chronic headaches   . COPD (chronic obstructive pulmonary disease) ([place])   . Depression   . Dyspnea    hospitalized July 2020  . Heart murmur   . Small cell lung cancer [facility])     Past Surgical History   Past Surgical History:  Procedure Laterality Date  . BRONCHIAL BIOPSY  08/24/2018   Procedure: BRONCHIAL BIOPSIES;  Surgeon: Margaretha Seeds, MD;  Location: [facility];  Service: Cardiopulmonary;;  . BRONCHIAL BRUSHINGS  08/24/2018   Procedure: BRONCHIAL BRUSHINGS;  Surgeon: Margaretha Seeds, MD;  Location: [facility];  Service: Cardiopulmonary;;  . BRONCHIAL NEEDLE ASPIRATION BIOPSY  08/24/2018   Procedure: BRONCHIAL NEEDLE ASPIRATION BIOPSIES;  Surgeon: Margaretha Seeds, MD;  Location: [facility];  Service: Cardiopulmonary;;  . ENDOBRONCHIAL ULTRASOUND N/A 08/24/2018   Procedure: ENDOBRONCHIAL ULTRASOUND;  Surgeon: Margaretha Seeds, MD;  Location: [facility];  Service: Cardiopulmonary;  Laterality: N/A;  . IR IMAGING GUIDED PORT INSERTION  09/01/2019  . IR THORACENTESIS ASP PLEURAL SPACE W/IMG GUIDE  09/14/2018  . TUBAL LIGATION    . VIDEO BRONCHOSCOPY  08/24/2018   Procedure: VIDEO  BRONCHOSCOPY;  Surgeon: Margaretha Seeds, MD;  Location: [facility];  Service: Cardiopulmonary;;    Social History   Social History   Tobacco Use  . Smoking status: Current Some Day Smoker    Packs/day: 2.00    Years: 39.00    Pack years: 78.00    Types: Cigarettes    Start date: 06/22/1979  . Smokeless tobacco: Never Used  Vaping Use  . Vaping Use: Never used  Substance Use Topics  . Alcohol use: Yes    Comment: occasional  . Drug use: Yes    Types: Marijuana    Comment: for appetite    Medications   Prior to Admission medications   Medication Sig Start Date End Date Taking? Authorizing Provider  dronabinol (MARINOL) 2.5 MG capsule Take 1 capsule (2.5 mg total) by mouth 2 (two) times daily before a meal. 05/19/19  Yes [name], MD  gabapentin (NEURONTIN) 300 MG capsule Take 1 tab in morning and 2 tabs at bedtime 07/29/19  Yes Vaslow, Acey Lav, MD  Oxycodone HCl 10 MG TABS Take 1 tablet (10 mg total) by mouth every 6 (six) hours as needed. Patient taking differently: Take 10 mg by mouth every 6 (six) hours as needed (pain.).  08/18/19  Yes [name], MD  prochlorperazine (COMPAZINE) 10 MG tablet Take 10 mg by mouth every 6 (six) hours as needed for nausea or vomiting.   Yes [provider]  baclofen (LIORESAL) 10 MG tablet Take 1 tablet (10 mg total) by mouth 3 (three) times daily as needed for muscle spasms. Patient not taking: Reported on 08/19/2019 02/21/19  Alla Feeling, NP  dexamethasone (DECADRON) 2 MG tablet Take 1 tablet (2 mg total) by mouth daily. 07/29/19   Ventura Sellers, MD  diazepam (VALIUM) 10 MG tablet Take 1 tablet po 30 minutes prior to MRI or radiation 08/02/19   Ventura Sellers, MD  dronabinol (MARINOL) 5 MG capsule Take 1 capsule (5 mg total) by mouth 2 (two) times daily before a meal. 08/18/19   [name], MD  DULoxetine (CYMBALTA) 20 MG capsule Take 2 capsules (40 mg total) by mouth daily. Take 1 capsule by mouth daily for the first week, then  increase to 2 caps daily 02/17/19   Alla Feeling, NP  lidocaine (LIDODERM) 5 % Apply one patch to the area behind the affected ear as needed for pain 09/12/19   Acquanetta Chain, DO  LORazepam (ATIVAN) 1 MG tablet Take 1 tablet (1 mg total) by mouth every 12 (twelve) hours as needed for anxiety. 08/18/19   [name], MD  promethazine (PHENERGAN) 25 MG tablet Take 1 tablet (25 mg total) by mouth every 6 (six) hours as needed for nausea or vomiting. 07/06/19   [name], MD    Allergies   Allergies  Allergen Reactions  . Penicillins Nausea And Vomiting    Did it involve swelling of the face/tongue/throat, SOB, or low BP? No Did it involve sudden or severe rash/hives, skin peeling, or any reaction on the inside of your mouth or nose? No Did you need to seek medical attention at a hospital or doctor's office? No When did it last happen?20 years If all above answers are "NO", may proceed with cephalosporin use.    Review of Systems  ROS  Neurologic Exam  Awake, alert, oriented Memory and concentration grossly intact Speech fluent, appropriate CN grossly intact Motor exam: Upper Extremities Deltoid Bicep Tricep Grip  Right 5/5 5/5 5/5 5/5  Left 5/5 5/5 5/5 5/5   Lower Extremities IP Quad PF DF EHL  Right 5/5 5/5 5/5 5/5 5/5  Left 5/5 5/5 5/5 5/5 5/5   Sensation grossly intact to LT  Imaging  MRI of the brain dated 08/25/2019 reveals an approximately 4 cm peripherally enhancing lesion in the anterior right frontal lobe with significant local mass-effect, effacement of the lateral ventricle, and significant surrounding edema.  Impression  - 54 y.o. female with history of small cell lung cancer and previous radiation treatment with enlargement of a right frontal lesion, unclear whether this represents radiation treatment effect versus progression of metastatic disease.  Plan  -We will plan on proceeding with stereotactic right frontal craniotomy for resection of lesion  I  have reviewed the imaging findings as well as the proposed treatment with the patient in the outpatient setting.  We have discussed the details of surgery, expected postoperative course and recovery, as well as the associated risks in detail.  All the patient's questions today were answered.  She provided informed consent to proceed.  Consuella Lose, MD [facility]

## 2019-09-19 NOTE — Op Note (Signed)
NEUROSURGERY OPERATIVE NOTE   PREOP DIAGNOSIS:  1. Small Cell lung CA 2. Right frontal tumor   POSTOP DIAGNOSIS: Same  PROCEDURE: 1. Stereotactic right frontal craniotomy for resection of tumor 2. Use of intraoperative microscope for microdissection  SURGEON: Dr. Consuella Lose, MD  ASSISTANT: None  ANESTHESIA: General Endotracheal  EBL: 400cc  SPECIMENS:  1.  Right frontal tumor for permanent pathology 2.  Right frontal dura for permanent pathology  DRAINS: None  COMPLICATIONS: None  CONDITION: Hemodynamically stable to PACU  HISTORY: Marissa [name] is a 54 y.o. female with history of small cell lung cancer diagnosed in 2020.  She underwent biopsy followed by concurrent chemo and radiation.  Initial staging MRI of the brain was negative however follow-up scan demonstrated a small right frontal metastasis.  She was treated with whole brain radiation treatment.  Follow-up MRI scans demonstrated progressive enlargement of the right frontal lesion.  Unclear whether this represented radiation treatment effect or progressive metastatic disease.  Her case was discussed at the multidisciplinary neuro-oncology conference and recommendation was to proceed with surgical resection.  The risks, benefits, and alternatives to surgery were all reviewed in detail with the patient.  After all her questions were answered informed consent was obtained and witnessed.  PROCEDURE IN DETAIL: The patient was brought to the operating room. After induction of general anesthesia, the patient was positioned on the operative table in the [facility] head holder in the prone position. All pressure points were meticulously padded.  Surface markers were then coregistered with the preoperative stereotactic MRI scan until a satisfactory accuracy was achieved.  This was then used to mark out a curvilinear right frontotemporal skin incision in order to allow access to the tumor.  Skin incision was then marked out and  prepped and draped in the usual sterile fashion.  After timeout was conducted, the skin incision was infiltrated with local anesthetic with epinephrine.  Incision was then made sharply and carried down through the galea.  Raney clips were applied.  The temporalis fascia and muscle were then incised and the periosteum was incised.  This was then elevated and a single piece myocutaneous flap was reflected anteriorly.  Stereotactic system was then used to plan out a craniotomy to allow access to the right frontal tumor.  A bur hole was created on the pterion as well as just under the superior temporal line.  This was connected with a craniotome and a single piece frontal craniotomy flap was elevated.  Hemostasis was initially secured on the epidural plane with a combination of bipolar electrocautery and morselized Gelfoam and thrombin.  At this point, there did appear to be a good plane between the tumor and the surrounding white matter.  Microscope was draped sterilely and brought into the field, and the remainder of the case was done under the microscope using microdissection.  The pia was initially coagulated with bipolar and cut.  Utilizing a combination of bipolar electrocautery and the ultrasonic aspirator on low tissue settings, the tumor was dissected away from the surrounding frontal lobe.  It became clear that the tumor was based on the orbitofrontal dura.  Once the anterior portion of the tumor was dissected away from the frontal lobe, this portion of the tumor was truncated and sent for permanent pathology.  The more posterior portion of the tumor was then debulked with the [facility]  This allowed further dissection of the tumor away from the deeper portion of the frontal lobe.  In this way, the posterior portion  of the tumor was removed with the exception of the tumor attached to the frontal dura.  Using a combination of dissectors, the frontal dura was dissected off of the orbital roof.  The dura was  then coagulated and cut piecemeal in order to remove the tumor progressing progressively posteriorly.  Once this was done, the resection cavity was inspected.  There did appear to be normal surrounding white matter, and no visible remaining tumor.  The remaining edges of the frontotemporal dura were coagulated with the bipolar.  The wound was then irrigated with copious amounts of normal saline irrigation.  Hemostasis was secured using a combination of bipolar electrocautery and morselized Gelfoam and thrombin.  A dural onlay graft was then placed.  The bone flap was then replaced and plated with standard titanium plates and screws.  The wound was again irrigated with normal saline.  The temporalis fascia was closed with interrupted 0 Vicryl stitches and the galea was closed with interrupted 3-0 Vicryl stitches.  Skin was closed with staples.  Bacitracin ointment and sterile dressing was applied.  The patient was then removed from the [facility] head holder.  Head wrap was placed.  Patient was then transferred to the stretcher, extubated, and taken to the postanesthesia care unit in stable hemodynamic condition.  At the end of the case all sponge, needle, instrument, and cottonoid counts were correct.

## 2019-09-19 NOTE — Anesthesia Procedure Notes (Signed)
Procedure Name: Intubation Date/Time: 09/19/2019 2:54 PM Performed by: Eligha Bridegroom, CRNA Pre-anesthesia Checklist: Patient identified, Emergency Drugs available, Suction available, Patient being monitored and Timeout performed Patient Re-evaluated:Patient Re-evaluated prior to induction Oxygen Delivery Method: Circle system utilized Preoxygenation: Pre-oxygenation with 100% oxygen Induction Type: Rapid sequence and Cricoid Pressure applied Ventilation: Mask ventilation with difficulty Grade View: Grade I Tube type: Oral Tube size: 7.0 mm Number of attempts: 1 Airway Equipment and Method: Stylet Placement Confirmation: ETT inserted through vocal cords under direct vision,  positive ETCO2 and breath sounds checked- equal and bilateral Secured at: 21 cm Tube secured with: Tape Dental Injury: Teeth and Oropharynx as per pre-operative assessment

## 2019-09-20 ENCOUNTER — Encounter (HOSPITAL_COMMUNITY): Payer: Self-pay | Admitting: Neurosurgery

## 2019-09-20 MED ORDER — DEXAMETHASONE 4 MG PO TABS
4.0000 mg | ORAL_TABLET | Freq: Four times a day (QID) | ORAL | Status: DC
  Administered 2019-09-20 – 2019-09-21 (×2): 4 mg via ORAL
  Filled 2019-09-20 (×2): qty 1

## 2019-09-20 MED ORDER — CHLORHEXIDINE GLUCONATE CLOTH 2 % EX PADS
6.0000 | MEDICATED_PAD | Freq: Every day | CUTANEOUS | Status: DC
  Administered 2019-09-20: 6 via TOPICAL

## 2019-09-20 MED ORDER — PANTOPRAZOLE SODIUM 40 MG PO TBEC
40.0000 mg | DELAYED_RELEASE_TABLET | Freq: Every day | ORAL | Status: DC
  Administered 2019-09-20: 40 mg via ORAL
  Filled 2019-09-20: qty 1

## 2019-09-20 MED ORDER — DEXAMETHASONE 4 MG PO TABS: 4.0000 mg | ORAL_TABLET | Freq: Three times a day (TID) | ORAL | Status: DC

## 2019-09-20 MED ORDER — GADOBUTROL 1 MMOL/ML IV SOLN
8.0000 mL | Freq: Once | INTRAVENOUS | Status: AC | PRN
  Administered 2019-09-20: 8 mL via INTRAVENOUS

## 2019-09-20 MED ORDER — LEVETIRACETAM 500 MG PO TABS
500.0000 mg | ORAL_TABLET | Freq: Two times a day (BID) | ORAL | Status: DC
  Administered 2019-09-20: 500 mg via ORAL
  Filled 2019-09-20: qty 1

## 2019-09-20 NOTE — Progress Notes (Signed)
  NEUROSURGERY PROGRESS NOTE   No issues overnight. Pt reports incisional HA, preop HA has drastically improved. No other c/o.  EXAM:  BP (!) 94/53   Pulse 65   Temp 97.8 F (36.6 C) (Oral)   Resp 17   Ht 5' 2" (1.575 m)   Wt 72.6 kg   LMP 04/24/2012   SpO2 98%   BMI 29.26 kg/m   Awake, alert, oriented  Speech fluent, appropriate  CN grossly intact  5/5 BUE/BLE  Headwrap in place, c/d  IMAGING: Postop MRI brain w/w/o reviewed, no evidence of residual tumor. Continued surrounding edema with local mass effect and MLS.  IMPRESSION:  54 y.o. female POD#1 s/p right frontal crani for resection of small cell met, appeared to be dural based. At baseline neurologically with satisfactory appearance of postop MRI.  PLAN: - Cont dexamethasone - Cont Keppra - d/c A-line, Foley - OOB today

## 2019-09-21 MED ORDER — METHYLPREDNISOLONE 4 MG PO TBPK: ORAL_TABLET | ORAL | 0 refills | Status: AC

## 2019-09-21 NOTE — Discharge Summary (Signed)
Physician Discharge Summary  Patient ID: Marissa [name] MRN: [id number] DOB/AGE: 10-14-1965 54 y.o.  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 female who was admitted for the below procedure. There were no post operative complications. At time of discharge, pain was well controlled, ambulating with Pt/OT, tolerating po, voiding normal. Ready for discharge.  Treatments: Surgery - right frontal crani for resection of small cell met, appeared to be dural based.    Discharge Exam: Blood pressure (!) 119/47, pulse 65, temperature 98 F (36.7 C), temperature source Oral, resp. rate (!) 25, height '5\' 2"'  (1.575 m), weight 72.6 kg, last menstrual period 04/24/2012, SpO2 95 %. Awake, alert, oriented Speech fluent, appropriate CN grossly intact 5/5 BUE/BLE Wound c/d/i  Disposition: Discharge disposition: 01-Home or Self Care       Discharge Instructions    Call MD for:  difficulty breathing, headache or visual disturbances   Complete by: As directed    Call MD for:  persistant dizziness or light-headedness   Complete by: As directed    Call MD for:  redness, tenderness, or signs of infection (pain, swelling, redness, odor or green/yellow discharge around incision site)   Complete by: As directed    Call MD for:  severe uncontrolled pain   Complete by: As directed    Call MD for:  temperature >100.4   Complete by: As directed    Diet - low sodium heart healthy   Complete by: As directed    Driving Restrictions   Complete by: As directed    Do not drive until given clearance.   Increase activity slowly   Complete by: As directed    Lifting restrictions   Complete by: As directed    Do not lift anything >10lbs. Avoid bending and twisting in awkward positions.  Avoid bending at the back.   May shower / Bathe   Complete by: As directed    In 24 hours. Okay to wash wound with warm soapy water. Avoid scrubbing the wound. Pat dry.   No dressing needed   Complete by: As directed      Allergies as of 09/21/2019      Reactions   Penicillins Nausea And Vomiting   Did it involve swelling of the face/tongue/throat, SOB, or low BP? No Did it involve sudden or severe rash/hives, skin peeling, or any reaction on the inside of your mouth or nose? No Did you need to seek medical attention at a hospital or doctor's office? No When did it last happen?20 years If all above answers are "NO", may proceed with cephalosporin use.      Medication List    STOP taking these medications   BC FAST PAIN RELIEF PO   dexamethasone 2 MG tablet Commonly known as: DECADRON     TAKE these medications   acetaminophen 325 MG tablet Commonly known as: TYLENOL Take 1,300 mg by mouth every 6 (six) hours as needed for mild pain or moderate pain.   baclofen 10 MG tablet Commonly known as: LIORESAL Take 1 tablet (10 mg total) by mouth 3 (three) times daily as needed for muscle spasms.   diazepam 10 MG tablet Commonly known as: VALIUM Take 1 tablet po 30 minutes prior to MRI or radiation What changed:   how much  to take  how to take this  when to take this  additional instructions   dronabinol 5 MG capsule Commonly known as: MARINOL Take 1 capsule (5 mg total) by mouth 2 (two) times daily before a meal. What changed: Another medication with the same name was removed. Continue taking this medication, and follow the directions you see here.   DULoxetine 20 MG capsule Commonly known as: CYMBALTA Take 2 capsules (40 mg total) by mouth daily. Take 1 capsule by mouth daily for the first week, then increase to 2 caps daily What changed: additional instructions   gabapentin 300 MG capsule Commonly known as: NEURONTIN Take 1 tab in morning and 2 tabs at  bedtime   lidocaine 5 % Commonly known as: Lidoderm Apply one patch to the area behind the affected ear as needed for pain What changed:   how much to take  how to take this  when to take this  additional instructions   LORazepam 1 MG tablet Commonly known as: ATIVAN Take 1 tablet (1 mg total) by mouth every 12 (twelve) hours as needed for anxiety.   methylPREDNISolone 4 MG Tbpk tablet Commonly known as: Medrol Take according to pack insert   Oxycodone HCl 10 MG Tabs Take 1 tablet (10 mg total) by mouth every 6 (six) hours as needed. What changed:   how much to take  when to take this  reasons to take this   promethazine 25 MG tablet Commonly known as: PHENERGAN Take 1 tablet (25 mg total) by mouth every 6 (six) hours as needed for nausea or vomiting.            Discharge Care Instructions  (From admission, onward)         Start     Ordered   09/21/19 0000  No dressing needed        09/21/19 0816          Follow-up Information    Consuella Lose, MD. Schedule an appointment as soon as possible for a visit in 2 week(s).   Specialty: Neurosurgery Contact information: [address] [place] [address] [phone]               Signed: Traci Sermon 09/21/2019, 8:19 AM

## 2019-09-21 NOTE — Evaluation (Signed)
Occupational Therapy Evaluation Patient Details Name: Marissa [name] MRN: [id number] DOB: Sep 26, 1965 Today's Date: 09/21/2019    History of Present Illness This 54 y.o. female admitted for Rt frontal craniotomy for small cell metastasis.   PMH includes:  small cell lung CA undergoing chemo and radiation per her report; COPD, depression, asthma.     Clinical Impression   Patient evaluated by Occupational Therapy with no further acute OT needs identified. All education has been completed and the patient has no further questions. Pt is able to perform ADLs mod I. She demonstrates mild balance deficits and mild cognitive deficits which appear to be likely at baseline - she reports her balance is improved compared to PTA.  She is very eager to discharge home.  See below for any follow-up Occupational Therapy or equipment needs. OT is signing off. Thank you for this referral.      Follow Up Recommendations  No OT follow up;Supervision/Assistance - 24 hour (initially )    Equipment Recommendations  None recommended by OT    Recommendations for Other Services       Precautions / Restrictions Precautions Precautions: Fall Precaution Comments: pt with mild balance deficits which she reports is improved since surgery.  She denies h/o falls       Mobility Bed Mobility Overal bed mobility: Independent                Transfers Overall transfer level: Modified independent                    Balance Overall balance assessment: Mild deficits observed, not formally tested                                         ADL either performed or assessed with clinical judgement   ADL Overall ADL's : Modified independent                                       General ADL Comments: Discussed recommendation that she sit to shower, and she fully agreed reporting that she can borrow a shower seat      Vision Baseline Vision/History: Wears glasses Wears  Glasses: Reading only Patient Visual Report: Blurring of vision;Diplopia Additional Comments: Pt demonstrates full EOMs. She has lower lid swelling that limits her vision and she reports intermittent diplopia she attributes to the swelling      Perception Perception Perception Tested?: Yes   Praxis Praxis Praxis tested?: Within functional limits    Pertinent Vitals/Pain Pain Assessment: Faces Faces Pain Scale: Hurts a little bit Pain Location: head  Pain Descriptors / Indicators: Headache Pain Intervention(s): Monitored during session     Hand Dominance Right   Extremity/Trunk Assessment Upper Extremity Assessment Upper Extremity Assessment: Overall WFL for tasks assessed   Lower Extremity Assessment Lower Extremity Assessment: Overall WFL for tasks assessed   Cervical / Trunk Assessment Cervical / Trunk Assessment: Normal   Communication Communication Communication: No difficulties   Cognition Arousal/Alertness: Awake/alert Behavior During Therapy: Impulsive;WFL for tasks assessed/performed Overall Cognitive Status: Within Functional Limits for tasks assessed                                 General Comments: Pt oriented fully.  She  is impulsive and no family present to confirm her baseline, however, based on her personality, I am guessing this may be at, or close to her baseline.    General Comments       Exercises     Shoulder Instructions      Home Living Family/patient expects to be discharged to:: Private residence Living Arrangements: Spouse/significant other;Non-relatives/Friends Available Help at Discharge: Family;Friend(s);Available 24 hours/day Type of Home: House Home Access: Stairs to enter [employer] of Steps: 3   Home Layout: One level     Bathroom Shower/Tub: Teacher, early years/pre: Standard     Home Equipment: Environmental consultant - 2 wheels   Additional Comments: Pt lives with her boyfriend and roommate.  She  reports that she has friends who have tub seats and that she can borrow a tub seat from one of them.       Prior Functioning/Environment Level of Independence: Needs assistance  Gait / Transfers Assistance Needed: pt reports she was mod I with ambulation without a device.  She reports she was wobbly, and had to furniture walk at times  ADL's / [place] Needed: Pt reports her boyfriend has been cooking and cleaning for the past several weeks.  She does not drive             OT Problem List: Impaired balance (sitting and/or standing);Impaired vision/perception      OT Treatment/Interventions:      OT Goals(Current goals can be found in the care plan section) Acute Rehab OT Goals Patient Stated Goal: to go home now  OT Goal Formulation: All assessment and education complete, DC therapy  OT Frequency:     Barriers to D/C:            Co-evaluation              AM-PAC OT "6 Clicks" Daily Activity     Outcome Measure Help from another person eating meals?: None Help from another person taking care of personal grooming?: None Help from another person toileting, which includes using toliet, bedpan, or urinal?: None Help from another person bathing (including washing, rinsing, drying)?: None Help from another person to put on and taking off regular upper body clothing?: None Help from another person to put on and taking off regular lower body clothing?: None 6 Click Score: 24   End of Session Nurse Communication: Mobility status  Activity Tolerance: Patient tolerated treatment well Patient left: Other (comment) (with RN standing in room )  OT Visit Diagnosis: Unsteadiness on feet (R26.81);Cognitive communication deficit (R41.841)                Time: 8341-9622 OT Time Calculation (min): 11 min Charges:  OT General Charges $OT Visit: 1 Visit OT Evaluation $OT Eval Moderate Complexity: 1 Mod  Nilsa Nutting., OTR/L Acute Rehabilitation Services Pager  [phone] Office [phone]   Lucille Passy M 09/21/2019, 10:14 AM

## 2019-09-21 NOTE — Anesthesia Postprocedure Evaluation (Signed)
Anesthesia Post Note  Patient: [name]  Procedure(s) Performed: CRANIOTOMY TUMOR EXCISION, FRONTAL (Right Head) APPLICATION OF CRANIAL NAVIGATION (Right Head)     Patient location during evaluation: PACU Anesthesia Type: General Level of consciousness: awake and alert Pain management: pain level controlled Vital Signs Assessment: post-procedure vital signs reviewed and stable Respiratory status: spontaneous breathing, nonlabored ventilation, respiratory function stable and patient connected to nasal cannula oxygen Cardiovascular status: blood pressure returned to baseline and stable Postop Assessment: no apparent nausea or vomiting Anesthetic complications: no   No complications documented.  Last Vitals:  Vitals:   09/21/19 0700 09/21/19 0800  BP: (!) 129/52 (!) 119/47  Pulse: 64 65  Resp: (!) 24 (!) 25  Temp:    SpO2: 98% 95%    Last Pain:  Vitals:   09/21/19 0800  TempSrc:   PainSc: 8                  Tiajuana Amass

## 2019-09-21 NOTE — Progress Notes (Signed)
  NEUROSURGERY PROGRESS NOTE   No issues overnight.  No concerns this am, eager for discharge  EXAM:  BP (!) 119/47   Pulse 65   Temp 98 F (36.7 C) (Oral)   Resp (!) 25   Ht 5' 2" (1.575 m)   Wt 72.6 kg   LMP 04/24/2012   SpO2 95%   BMI 29.26 kg/m   Awake, alert, oriented  Speech fluent, appropriate  CN grossly intact  5/5 BUE/BLE  Bandage: dried blood  IMPRESSION/PLAN 54 y.o. 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 At baseline neurologically - d/c home

## 2019-09-23 LAB — SURGICAL PATHOLOGY

## 2019-09-27 ENCOUNTER — Telehealth: Payer: Self-pay | Admitting: *Deleted

## 2019-09-27 NOTE — Telephone Encounter (Signed)
Attempted to reach patient to to schedule visit to review path results.  Left message pending call back.

## 2019-09-28 ENCOUNTER — Telehealth: Payer: Self-pay

## 2019-09-28 ENCOUNTER — Telehealth: Payer: Self-pay | Admitting: Hematology

## 2019-09-28 NOTE — Telephone Encounter (Signed)
Scheduled appt per 8/31 sch msg-  Pt is aware of apt date and time

## 2019-09-28 NOTE — Telephone Encounter (Signed)
Fareeha left vm stating she needed to schedule an appt. Review of chart Shelle Iron left vm for patient on 09/27/2019.  Forwarded to [employer]

## 2019-09-30 ENCOUNTER — Other Ambulatory Visit: Payer: Self-pay | Admitting: Radiation Therapy

## 2019-10-04 ENCOUNTER — Telehealth: Payer: Self-pay | Admitting: Internal Medicine

## 2019-10-04 NOTE — Telephone Encounter (Signed)
Scheduled appt per 9/7 sch msg - pt unable to come in this wk - scheduled for next available that works for patient.

## 2019-10-10 ENCOUNTER — Inpatient Hospital Stay: Payer: Medicaid Other | Attending: Hematology

## 2019-10-10 DIAGNOSIS — C7931 Secondary malignant neoplasm of brain: Secondary | ICD-10-CM | POA: Insufficient documentation

## 2019-10-10 DIAGNOSIS — F419 Anxiety disorder, unspecified: Secondary | ICD-10-CM | POA: Insufficient documentation

## 2019-10-10 DIAGNOSIS — C771 Secondary and unspecified malignant neoplasm of intrathoracic lymph nodes: Secondary | ICD-10-CM | POA: Insufficient documentation

## 2019-10-10 DIAGNOSIS — F329 Major depressive disorder, single episode, unspecified: Secondary | ICD-10-CM | POA: Insufficient documentation

## 2019-10-10 DIAGNOSIS — C3412 Malignant neoplasm of upper lobe, left bronchus or lung: Secondary | ICD-10-CM | POA: Insufficient documentation

## 2019-10-10 DIAGNOSIS — Z79899 Other long term (current) drug therapy: Secondary | ICD-10-CM | POA: Insufficient documentation

## 2019-10-10 DIAGNOSIS — Z923 Personal history of irradiation: Secondary | ICD-10-CM | POA: Insufficient documentation

## 2019-10-11 ENCOUNTER — Inpatient Hospital Stay: Payer: Medicaid Other | Admitting: Internal Medicine

## 2019-10-11 ENCOUNTER — Telehealth: Payer: Self-pay

## 2019-10-11 NOTE — Telephone Encounter (Signed)
Rceived TC from patient stating that she needs to cancel her appointment today with Dr Mickeal Skinner at 12pm due to her knee hurting and not being able to walk well. Made Dr Mickeal Skinner aware of canceled appointment. Also sent over schedule message to get appointment rescheduled.

## 2019-10-12 NOTE — Progress Notes (Signed)
[facility]   [phone] [phone]) [phone]   Clinic Follow up Note   Patient Care Team: [name], MD as PCP - General (Hematology)  Date of Service:  10/14/2019  CHIEF COMPLAINT: F/u of Small Cell Lung Cancer  SUMMARY OF ONCOLOGIC HISTORY: Oncology History Overview Note  Cancer Staging Small cell lung cancer, left upper lobe ([place]) Staging form: Lung, AJCC 8th Edition - Clinical stage from 08/24/2018: Stage IIIA (cT1a, cN2, cM0) - Signed by [name], MD on 10/17/2018    Small cell lung cancer, left upper lobe ([place])  07/29/2018 Imaging   CT Angio Chest 07/29/18  IMPRESSION: 1. Malignant appearing left mediastinal and hilar lymphadenopathy narrowing the airways at the left hilum. In the left lung only a subtle 9 mm spiculated nodule is identified in the lingula. Consider small cell carcinoma. Bronchoscopy or mediastinal node sampling might be most valuable for diagnosis. 2. Indeterminate although low-density (which typically which indicates a benign adenoma) left adrenal nodule. 3.  Negative for acute pulmonary embolus.   08/24/2018 Definitive Surgery   Bronchoscopy and EBUS biopsy on 08/24/18 by Dr. Loanne Drilling   08/24/2018 Initial Biopsy   Diagnosis 08/24/18 FINE NEEDLE ASPIRATION, ENDOSCOPIC, (EBUS) STATION # 7 LYMPH NODE(SPECIMEN 1 OF 4 COLLECTED 08/24/18): NO MALIGNANT CELLS IDENTIFIED.  FINE NEEDLE ASPIRATION, ENDOSCOPIC, (EBUS) LEFT HILAR LYMPH NODE(SPECIMEN 2 OF 4 COLLECTED 08/24/18): NO MALIGNANT CELLS IDENTIFIED.  TRANSBRONCHIAL NEEDLE ASPIRATION, WANG, (SPECIMEN 3 OF 4 COLLECTED 08/24/18): NO MALIGNANT CELLS IDENTIFIED.  BRONCHIAL BRUSHING, LUL (SPECIMEN 4 OF 4 COLLECTED 08/24/18): MALIGNANT CELLS PRESENT, MOST CONSISTENT WITH SMALL CELL CARCINOMA. SEE COMMENT.   08/24/2018 Cancer Staging   Staging form: Lung, AJCC 8th Edition - Clinical stage from 08/24/2018: Stage IIIA (cT1a, cN2, cM0) - Signed by [name], MD on 10/17/2018   08/26/2018  Initial Diagnosis   Small cell lung cancer, left ([place])   08/26/2018 Imaging   CT AP W Contrast 08/26/18  IMPRESSION: 1. 2.9 cm left adrenal nodule cannot be definitively characterized on this study. Potentially a lipid poor adenoma, but metastatic disease not excluded. MRI of the abdomen with and without contrast may prove helpful to further evaluate. 2. No other findings to suggest metastatic disease in the abdomen/pelvis. 3. Left base atelectasis with small left pleural effusion.   08/27/2018 Imaging   MRI brain 08/27/18  IMPRESSION: 1. Motion degraded, incomplete examination. 2. Subcentimeter focus of diffusion signal abnormality in the left cerebellum. No enhancement is evident to strongly suggest a metastasis, and this may reflect an acute to subacute small vessel infarct. 3. No evidence of intracranial metastatic disease elsewhere. 4. Given motion artifact and incomplete postcontrast imaging, consider short-term follow-up MRI (possibly with sedation) to further evaluate the left cerebellar abnormality and provide a more thorough evaluation for metastatic disease.   09/02/2018 PET scan   IMPRESSION: 1. Hypermetabolic left hilar mass is identified encasing the left mainstem bronchus resulting in postobstructive atelectasis in the left upper lobe. 2. Hypermetabolic left mediastinal nodal metastasis. 3. Loculated left pleural effusion is identified with mild increased FDG uptake. Cannot rule out malignant pleural effusion. 4. No evidence for metastatic disease to the abdomen or pelvis or skeletal structures.     09/07/2018 - 11/10/2018 Chemotherapy   Concurrent chemoRT with IV cisplatin on day 1 and etopside day 1-3 for every 3 weeks starting 8/11. Will start radiation with Dr. Lisbeth Renshaw with cycle 2. She completed 4 cycles on 11/10/18   09/14/2018 Procedure   Thoracentesis yielding 750 mL   09/28/2018 -  12/09/2019 Radiation Therapy   Concurrent chemoRT with Dr. Lisbeth Renshaw. Start with  cycle 2 chemo on 09/28/18 and completed on 12/09/18.    02/10/2019 Imaging   CT CAP W Contrast  IMPRESSION: 1. Interval response to therapy. Significant interval reduction in tumor burden involving the left hemithorax. 2. No new or progressive disease identified within the chest, abdomen or pelvis. 3. Emphysema and aortic atherosclerosis. 4. Stable left adrenal nodule.   Aortic Atherosclerosis (ICD10-I70.0) and Emphysema (ICD10-J43.9).   02/11/2019 Imaging   MRI Brain  IMPRESSION: 6 x 10 mm solitary metastatic deposit right frontal lobe with minimal edema.   02/28/2019 - 03/14/2019 Radiation Therapy   Target Brain Radiation with Dr. Lisbeth Renshaw    05/19/2019 Imaging   CT CAP w contrast  IMPRESSION: 1. Trace residual soft tissue thickening at the left lung apex and along the AP window. Slight increase in size of a low left paratracheal lymph node. Continued attention on follow-up exams is warranted. No evidence of distant metastatic disease. 2. Subpleural density in the posterior left upper lobe is new and may be due to atelectasis. Recommend attention on follow-up. 3. Left adrenal adenoma. 4. Aortic atherosclerosis (ICD10-I70.0). Coronary artery calcification. 5.  Emphysema (ICD10-J43.9).   07/13/2019 Imaging   MRI brain  IMPRESSION: 1. Increased size of right frontal lobe metastasis with new moderate edema. 2. No evidence of new intracranial metastases.     08/10/2019 Imaging   CT CAP W Contrast  IMPRESSION: 1. New and progressive adenopathy within the mediastinum and low left neck, consistent with nodal metastasis. 2. No evidence of pulmonary metastasis. Similar areas of probable scarring and pleural thickening. 3. No subdiaphragmatic metastatic disease identified. 4. Left adrenal adenoma. 5. Age advanced aortic atherosclerosis (ICD10-I70.0) and emphysema (ICD10-J43.9).     08/19/2019 Imaging   Ct Angio Chest  IMPRESSION: 1. No evidence of pulmonary embolus. 2. Stable  nodal metastasis within the mediastinum and left internal jugular chain. 3. Interval development of mild interlobular septal thickening and ground-glass opacity, greatest in the right upper lobe and left lower lobe. Differential includes asymmetric edema versus atypical infection. 4. Aortic Atherosclerosis (ICD10-I70.0) and Emphysema (ICD10-J43.9).     08/25/2019 Imaging   MRI Brain  IMPRESSION: Increased size of centrally necrotic enhancing right frontal lesion measuring 4.0 cm, previously 2.6 cm.   White matter edema and dural thickening/enhancement involving the right frontal convexity is more conspicuous than prior exam.   No new enhancing lesion.   09/01/2019 Procedure   PAC placement    09/19/2019 Imaging   MRI brain  IMPRESSION: 1. Postoperative changes from interval right frontal craniotomy for resection of previously identified right frontal lobe mass. Somewhat ill-defined parenchymal enhancement about the resection cavity favored to reflect postoperative changes. No definite residual tumor seen on this immediate postoperative scan. 2. Small 1.2 cm infarct at the deep margin of the resection cavity. 3. Interval increase in T2/FLAIR signal intensity involving the adjacent right frontal region with increased regional mass effect and up to 6 mm of right-to-left shift.   09/19/2019 Surgery   Right CRANIOTOMY TUMOR EXCISION, FRONTAL and APPLICATION OF CRANIAL NAVIGATION by Dr Kathyrn Sheriff   09/19/2019 Pathology Results   FINAL MICROSCOPIC DIAGNOSIS:   A. BRAIN TUMOR, RIGHT FRONTAL, EXCISION:  - Small cell carcinoma.   B. BRAIN TUMOR, RIGHT FRONTAL DURA, EXCISION:  - Small cell carcinoma.    COMMENT:   CK8/18 (dot-like), Synaptophysin and Chromogranin are positive.  CKAE1AE3, CK7 and Chromogranin are noncontributory.  Ki-67 proliferation  index reaches 70-80%.  The morphology and immunophenotype are compatible  with the provided clinical history of small cell  carcinoma.  Dr. Melina Copa  reviewed.    10/24/2019 -  Chemotherapy   First-line chemo Carboplatin day 1, Etopside day 1-3 with immunotherapy Tecentriq day 1 q3weeks starting 10/24/19      CURRENT THERAPY:  First-line chemo Carboplatin, Etopside day 1-3 with immunotherapy Tecentriq q3weeks starting 10/24/19  INTERVAL HISTORY:  Marissa [name] is here for a follow up. She presents to the clinic alone. She notes she has been using her supplemental oxygen and breathing treatment more frequently lately. She feels her SOB is related to the humidity outside. She notes her prior headaches are no longer. She now has pain at her scalp surgical site. She is not scheduled to f/u with her neurosurgeon. She plans to move into better housing in 2 weeks.    REVIEW OF SYSTEMS:   Constitutional: Denies fevers, chills or abnormal weight loss Eyes: Denies blurriness of vision Ears, nose, mouth, throat, and face: Denies mucositis or sore throat Respiratory: Denies cough, dyspnea or wheezes Cardiovascular: Denies palpitation, chest discomfort or lower extremity swelling Gastrointestinal:  Denies nausea, heartburn or change in bowel habits Skin: Denies abnormal skin rashes (+) Scalp pain at surgical scalp  Lymphatics: Denies new lymphadenopathy or easy bruising MSK: (+) Right knee pain  Neurological:Denies numbness, tingling or new weaknesses Behavioral/Psych: Mood is stable, no new changes  All other systems were reviewed with the patient and are negative.  MEDICAL HISTORY:  Past Medical History:  Diagnosis Date  . Asthma   . Chronic headaches   . COPD (chronic obstructive pulmonary disease) ([place])   . Depression   . Dyspnea    hospitalized July 2020  . Heart murmur   . Small cell lung cancer ([place])     SURGICAL HISTORY: Past Surgical History:  Procedure Laterality Date  . APPLICATION OF CRANIAL NAVIGATION Right 09/19/2019   Procedure: APPLICATION OF CRANIAL NAVIGATION;  Surgeon: Consuella Lose, MD;   Location: [facility];  Service: Neurosurgery;  Laterality: Right;  APPLICATION OF CRANIAL NAVIGATION  . BRONCHIAL BIOPSY  08/24/2018   Procedure: BRONCHIAL BIOPSIES;  Surgeon: Margaretha Seeds, MD;  Location: [facility];  Service: Cardiopulmonary;;  . BRONCHIAL BRUSHINGS  08/24/2018   Procedure: BRONCHIAL BRUSHINGS;  Surgeon: Margaretha Seeds, MD;  Location: [facility];  Service: Cardiopulmonary;;  . BRONCHIAL NEEDLE ASPIRATION BIOPSY  08/24/2018   Procedure: BRONCHIAL NEEDLE ASPIRATION BIOPSIES;  Surgeon: Margaretha Seeds, MD;  Location: [facility];  Service: Cardiopulmonary;;  . CRANIOTOMY Right 09/19/2019   Procedure: CRANIOTOMY TUMOR EXCISION, FRONTAL;  Surgeon: Consuella Lose, MD;  Location: [facility];  Service: Neurosurgery;  Laterality: Right;  CRANIOTOMY TUMOR EXCISION, FRONTAL  . ENDOBRONCHIAL ULTRASOUND N/A 08/24/2018   Procedure: ENDOBRONCHIAL ULTRASOUND;  Surgeon: Margaretha Seeds, MD;  Location: [facility];  Service: Cardiopulmonary;  Laterality: N/A;  . IR IMAGING GUIDED PORT INSERTION  09/01/2019  . IR THORACENTESIS ASP PLEURAL SPACE W/IMG GUIDE  09/14/2018  . TUBAL LIGATION    . VIDEO BRONCHOSCOPY  08/24/2018   Procedure: VIDEO BRONCHOSCOPY;  Surgeon: Margaretha Seeds, MD;  Location: [facility];  Service: Cardiopulmonary;;    I have reviewed the social history and family history with the patient and they are unchanged from previous note.  ALLERGIES:  is allergic to penicillins.  MEDICATIONS:  Current Outpatient Medications  Medication Sig Dispense Refill  . acetaminophen (TYLENOL) 325 MG tablet Take 1,300 mg by mouth every 6 (six) hours as needed for mild  pain or moderate pain.    . diazepam (VALIUM) 10 MG tablet Take 1 tablet po 30 minutes prior to MRI or radiation (Patient taking differently: Take 20 mg by mouth See admin instructions. Take 1-2 tablets by mouth 30 minutes prior to MRI or radiation) 5 tablet 0  . dronabinol (MARINOL) 5 MG capsule Take 1 capsule (5 mg  total) by mouth 2 (two) times daily before a meal. 60 capsule 0  . DULoxetine (CYMBALTA) 20 MG capsule Take 2 capsules (40 mg total) by mouth daily. Take 1 capsule by mouth daily for the first week, then increase to 2 caps daily (Patient taking differently: Take 40 mg by mouth daily. ) 60 capsule 1  . gabapentin (NEURONTIN) 300 MG capsule Take 1 tab in morning and 2 tabs at bedtime 90 capsule 2  . lidocaine (LIDODERM) 5 % Apply one patch to the area behind the affected ear as needed for pain (Patient taking differently: Place 1 patch onto the skin daily. ) 30 patch 0  . LORazepam (ATIVAN) 1 MG tablet Take 1 tablet (1 mg total) by mouth every 12 (twelve) hours as needed for anxiety. 30 tablet 0  . promethazine (PHENERGAN) 25 MG tablet Take 1 tablet (25 mg total) by mouth every 6 (six) hours as needed for nausea or vomiting. 30 tablet 1  . baclofen (LIORESAL) 10 MG tablet Take 1 tablet (10 mg total) by mouth 3 (three) times daily as needed for muscle spasms. 30 each 0  . Oxycodone HCl 20 MG TABS Take 1 tablet (20 mg total) by mouth 3 (three) times daily as needed. 90 tablet 0   No current facility-administered medications for this visit.    PHYSICAL EXAMINATION: ECOG PERFORMANCE STATUS: 2 - Symptomatic, <50% confined to bed   Due to COVID19 we will limit examination to appearance. Patient had no complaints.  GENERAL:alert, no distress and comfortable SKIN: skin color normal, no rashes or significant lesions (+)Frontal scalp incision healed well  EYES: normal, Conjunctiva are pink and non-injected, sclera clear  NEURO: alert & oriented x 3 with fluent speech   LABORATORY DATA:  I have reviewed the data as listed CBC Latest Ref Rng & Units 09/19/2019 09/01/2019 08/19/2019  WBC 4.0 - 10.5 K/uL 7.0 6.9 9.3  Hemoglobin 12.0 - 15.0 g/dL 13.2 14.3 13.6  Hematocrit 36 - 46 % 42.0 44.7 43.4  Platelets 150 - 400 K/uL 305 300 328     CMP Latest Ref Rng & Units 09/19/2019 08/19/2019 08/18/2019  Glucose 70  - 99 mg/dL 118(H) 120(H) 96  BUN 6 - 20 mg/dL 31(H) 28(H) 29(H)  Creatinine 0.44 - 1.00 mg/dL 0.72 0.75 1.23(H)  Sodium 135 - 145 mmol/L 143 142 145  Potassium 3.5 - 5.1 mmol/L 3.4(L) 4.0 4.6  Chloride 98 - 111 mmol/L 108 109 106  CO2 22 - 32 mmol/L $RemoveB'24 22 28  'JgJLYMIk$ Calcium 8.9 - 10.3 mg/dL 10.0 9.7 9.5  Total Protein 6.5 - 8.1 g/dL - - 7.4  Total Bilirubin 0.3 - 1.2 mg/dL - - <0.1(L)  Alkaline Phos 38 - 126 U/L - - 67  AST 15 - 41 U/L - - 19  ALT 0 - 44 U/L - - 28      RADIOGRAPHIC STUDIES: I have personally reviewed the radiological images as listed and agreed with the findings in the report. No results found.   ASSESSMENT & PLAN:  Marissa [name] is a 54 y.o. female with    1.Small cell lung cancer, limited stagein  07/2018,brain metastasis in 01/2019, mediastinal node recurrence in 07/2019 -She was diagnosed in 07/2018. Given her locally advanced disease (stage III cancer), she underwent standard treatment with concurrent chemoRT with 4 cycles of cisplatin/etopside.  -Unfortunately her Brain MRI from 02/11/19 showed 6x10 mm solitary brain metastasis in right frontal lobe. She completed radiation to brain 02/28/19-03/14/19 with Dr. Lisbeth Renshaw. -Her cancer progressed on CT CAP from 08/10/19 which showed new and progressive adenopathy within the mediastinum and low left neck consistent with nodal metastasis. No evidence of pulmonary or other distant metastasis. -Given symptomatic brain metastasis progression. She underwent right craniotomy tumor resection on 09/19/19. She is recovering well  -I recommend CT chest to determine new baseline. Given recurrent disease, I discussed restarting chemo with IV Carboplatin on day 1, Etopside day 1-3 with immunotherapy Tecentriq on day 1 q3weeks to control her disease. She is interested, plan to start on 9/27.  -the goal of therapy is palliative, for disease control and prolonging her life  -F/u with start of chemo.    2. Headaches, secondary to brain metastasis,  S/p resection.  -She underwent right craniotomy tumor excision with Dr Kathyrn Sheriff on 09/19/19. She her only head pain is from this currently.  -She will continue to f/u with Dr. Mickeal Skinner.  3. Smoking Cessation, COPD,H/oacute left tension pneumothorax in 07/2018 after bronchoscopy and EBUS biopsy (resolved) -She has a h/o heavy smoking. She has COPD. She is no longer on oxygen but has wheezing. Will continue inhaler and nebulizer.  -She has reduced her smoking to 10-11 cigarettes a day. I highly advised her to quit completely. She is still trying. -She has been using her supplemental oxygen and breathing treatment more often lately due to SOB. Will obtain CT chest next week for new baseline.   4. Severe LUQ and left flank pain, right knee pain  -She is now being seen by Dr Nancy Nordmann for pain management.She remains on Oxycodone 20mg  q6hours and Cymbalta.  -Her main pain is currently from her head at surgical site and her right knee.  -I refilled Gabapentin 300mg  today (10/14/19)  5. Iron deficient anemia, S/p 2 IV Iron doses in 07/2018.Currently resolved off chemo   6. Anxiety/Depression, irritability, Social Support  -On Ativan and Valium as needed.  -She notes she uses Marijuana to help her eat. She is onlow dose oxycodonenow. She denies any other use of recreational drugs.  -She will continue SW involvement. She has not seen [place] provider yet.  -Currently on Medicaid, disability, food stamps. She gets $529 per month. Still does not have a car and gets transportation assistance from [facility].  -She plans to move to better housing (3 bedroom house) in 2 weeks -Mood seems more positive lately  7. Goals of care -She knows her new brain metastasis makes her cancer stage IV and carries a poor prognosis.  -goal of her treatment is palliative  -she is full code now    PLAN: -I refilled Gabapentin and Ativan today  -Lab, Flush, CT CAP w contrast next week for restaging.  -Lab, flush, F/u  with me or NP Lacie and chemo Carboplatin, Etopside Tecentriq on 9/27 with Etopside on Day 1-3.    No problem-specific Assessment & Plan notes found for this encounter.   Orders Placed This Encounter  Procedures  . CT Abdomen Pelvis W Contrast    Standing Status:   Future    Standing Expiration Date:   10/13/2020    Order Specific Question:   If indicated for the ordered procedure,  I authorize the administration of contrast media per Radiology protocol    Answer:   Yes    Order Specific Question:   Is patient pregnant?    Answer:   No    Order Specific Question:   Preferred imaging location?    Answer:   [facility]    Order Specific Question:   Release to patient    Answer:   Immediate    Order Specific Question:   Is Oral Contrast requested for this exam?    Answer:   Yes, Per Radiology protocol    Order Specific Question:   Radiology Contrast Protocol - do NOT remove file path    Answer:   [email or web address]  . CT Chest W Contrast    Standing Status:   Future    Standing Expiration Date:   10/13/2020    Order Specific Question:   If indicated for the ordered procedure, I authorize the administration of contrast media per Radiology protocol    Answer:   Yes    Order Specific Question:   Is patient pregnant?    Answer:   No    Order Specific Question:   Preferred imaging location?    Answer:   [facility]    Order Specific Question:   Radiology Contrast Protocol - do NOT remove file path    Answer:   [email or web address]   All questions were answered. The patient knows to call the clinic with any problems, questions or concerns. No barriers to learning was detected. The total time spent in the appointment was 30 minutes.     [name], MD 10/14/2019   I, Joslyn Devon, am acting as scribe for [name], MD.   I have reviewed the above documentation for accuracy and completeness, and I  agree with the above.

## 2019-10-13 ENCOUNTER — Other Ambulatory Visit: Payer: Self-pay | Admitting: Hematology

## 2019-10-13 DIAGNOSIS — C3412 Malignant neoplasm of upper lobe, left bronchus or lung: Secondary | ICD-10-CM

## 2019-10-14 ENCOUNTER — Inpatient Hospital Stay: Payer: Medicaid Other

## 2019-10-14 ENCOUNTER — Ambulatory Visit (HOSPITAL_BASED_OUTPATIENT_CLINIC_OR_DEPARTMENT_OTHER): Payer: Medicaid Other | Admitting: Internal Medicine

## 2019-10-14 ENCOUNTER — Other Ambulatory Visit: Payer: Self-pay

## 2019-10-14 ENCOUNTER — Other Ambulatory Visit: Payer: Self-pay | Admitting: Internal Medicine

## 2019-10-14 ENCOUNTER — Inpatient Hospital Stay (HOSPITAL_BASED_OUTPATIENT_CLINIC_OR_DEPARTMENT_OTHER): Payer: Medicaid Other | Admitting: Hematology

## 2019-10-14 ENCOUNTER — Encounter: Payer: Self-pay | Admitting: Hematology

## 2019-10-14 DIAGNOSIS — C7931 Secondary malignant neoplasm of brain: Secondary | ICD-10-CM

## 2019-10-14 DIAGNOSIS — C3490 Malignant neoplasm of unspecified part of unspecified bronchus or lung: Secondary | ICD-10-CM

## 2019-10-14 DIAGNOSIS — R519 Headache, unspecified: Secondary | ICD-10-CM

## 2019-10-14 DIAGNOSIS — C3412 Malignant neoplasm of upper lobe, left bronchus or lung: Secondary | ICD-10-CM | POA: Diagnosis not present

## 2019-10-14 DIAGNOSIS — F329 Major depressive disorder, single episode, unspecified: Secondary | ICD-10-CM

## 2019-10-14 DIAGNOSIS — F32A Depression, unspecified: Secondary | ICD-10-CM

## 2019-10-14 DIAGNOSIS — C3492 Malignant neoplasm of unspecified part of left bronchus or lung: Secondary | ICD-10-CM | POA: Diagnosis not present

## 2019-10-14 DIAGNOSIS — Z79899 Other long term (current) drug therapy: Secondary | ICD-10-CM | POA: Diagnosis not present

## 2019-10-14 DIAGNOSIS — C771 Secondary and unspecified malignant neoplasm of intrathoracic lymph nodes: Secondary | ICD-10-CM | POA: Diagnosis not present

## 2019-10-14 DIAGNOSIS — F419 Anxiety disorder, unspecified: Secondary | ICD-10-CM

## 2019-10-14 DIAGNOSIS — Z923 Personal history of irradiation: Secondary | ICD-10-CM | POA: Diagnosis not present

## 2019-10-14 DIAGNOSIS — Z515 Encounter for palliative care: Secondary | ICD-10-CM

## 2019-10-14 MED ORDER — LORAZEPAM 1 MG PO TABS: 1.0000 mg | ORAL_TABLET | Freq: Two times a day (BID) | ORAL | 0 refills | Status: DC | PRN

## 2019-10-14 MED ORDER — GABAPENTIN 300 MG PO CAPS: ORAL_CAPSULE | ORAL | 2 refills | Status: DC

## 2019-10-14 MED ORDER — OXYCODONE HCL 20 MG PO TABS
20.0000 mg | ORAL_TABLET | Freq: Three times a day (TID) | ORAL | 0 refills | Status: DC | PRN
Start: 2019-10-14 — End: 2019-11-14

## 2019-10-14 MED ORDER — OXYCODONE HCL 20 MG PO TABS
20.0000 mg | ORAL_TABLET | Freq: Four times a day (QID) | ORAL | 0 refills | Status: DC | PRN
Start: 2019-10-14 — End: 2019-10-14

## 2019-10-14 NOTE — Progress Notes (Unsigned)
Sent new script to [facility] w/ 90 tablets. Limit per insurance.

## 2019-10-17 ENCOUNTER — Telehealth: Payer: Self-pay | Admitting: Hematology

## 2019-10-17 NOTE — Telephone Encounter (Signed)
Scheduled per 9/17 los. Pt is aware of appt times and date. Pt requested Labs/PF for scan after MD visit on 9/24.

## 2019-10-18 NOTE — Progress Notes (Signed)
Pharmacist Chemotherapy Monitoring - Initial Assessment    Anticipated start date: 10/24/19   Regimen:  . Are orders appropriate based on the patient's diagnosis, regimen, and cycle? Yes . Does the plan date match the patient's scheduled date? Yes . Is the sequencing of drugs appropriate? Yes . Are the premedications appropriate for the patient's regimen? Yes . Prior Authorization for treatment is: Pending o If applicable, is the correct biosimilar selected based on the patient's insurance? not applicable  Organ Function and Labs: Marland Kitchen Are dose adjustments needed based on the patient's renal function, hepatic function, or hematologic function? No . Are appropriate labs ordered prior to the start of patient's treatment? Yes . Other organ system assessment, if indicated: N/A . The following baseline labs, if indicated, have been ordered: atezolizumab: baseline TSH +/- T4  Dose Assessment: . Are the drug doses appropriate? Yes . Are the following correct: o Drug concentrations Yes o IV fluid compatible with drug Yes o Administration routes Yes o Timing of therapy Yes . If applicable, does the patient have documented access for treatment and/or plans for port-a-cath placement? not applicable . If applicable, have lifetime cumulative doses been properly documented and assessed? not applicable Lifetime Dose Tracking  No doses have been documented on this patient for the following tracked chemicals: Doxorubicin, Epirubicin, Idarubicin, Daunorubicin, Mitoxantrone, Bleomycin, Oxaliplatin, Carboplatin, Liposomal Doxorubicin  o   Toxicity Monitoring/Prevention: . The patient has the following take home antiemetics prescribed: Ondansetron, Prochlorperazine and Lorazepam . The patient has the following take home medications prescribed: N/A . Medication allergies and previous infusion related reactions, if applicable, have been reviewed and addressed. Yes . The patient's current medication list has  been assessed for drug-drug interactions with their chemotherapy regimen. no significant drug-drug interactions were identified on review.  Order Review: . Are the treatment plan orders signed? Yes . Is the patient scheduled to see a provider prior to their treatment? Yes  I verify that I have reviewed each item in the above checklist and answered each question accordingly.  Marissa [name] K 10/18/2019 2:48 PM

## 2019-10-21 ENCOUNTER — Other Ambulatory Visit: Payer: Medicaid Other

## 2019-10-21 ENCOUNTER — Inpatient Hospital Stay: Payer: Medicaid Other

## 2019-10-21 ENCOUNTER — Inpatient Hospital Stay: Payer: Medicaid Other | Admitting: Internal Medicine

## 2019-10-24 ENCOUNTER — Other Ambulatory Visit: Payer: Medicaid Other

## 2019-10-24 ENCOUNTER — Telehealth: Payer: Self-pay

## 2019-10-24 ENCOUNTER — Inpatient Hospital Stay: Payer: Medicaid Other

## 2019-10-24 ENCOUNTER — Ambulatory Visit: Payer: Medicaid Other | Admitting: Nurse Practitioner

## 2019-10-24 ENCOUNTER — Inpatient Hospital Stay: Payer: Medicaid Other | Admitting: Hematology

## 2019-10-24 NOTE — Telephone Encounter (Signed)
Ms riquelme left message with [facility].  She stated she was supposed to hve qa CT scan before she started chemo and didn't.  She is to start chemo today.  She asked if she should she still get treatment today.  Per dr. Burr Medico she can have treatment this week before scan.  I returned Ms Albro's call.  She states she has been vomiting since saturday and does not want to com for chemo today.    She states she may feel better in a day or 2.  Dr Burr Medico notified.

## 2019-10-25 ENCOUNTER — Inpatient Hospital Stay: Payer: Medicaid Other

## 2019-10-25 ENCOUNTER — Telehealth: Payer: Self-pay

## 2019-10-25 NOTE — Telephone Encounter (Signed)
I spoke with Ms Marissa [name] and let her know Ct scan date and time.  I reviewed instructions with her.  She states she has the oral contrast.  I reviewed her upcoming appts with [place]. She verbalized understanding

## 2019-10-25 NOTE — Telephone Encounter (Signed)
I left Marissa [name] stating that we did cancel her chemo appts for this week.  A Ct scan has been scheduled for 9/30 at 0800.  I asked her to call me.

## 2019-10-26 ENCOUNTER — Inpatient Hospital Stay: Payer: Medicaid Other

## 2019-10-27 ENCOUNTER — Encounter (HOSPITAL_COMMUNITY): Payer: Self-pay

## 2019-10-27 ENCOUNTER — Ambulatory Visit (HOSPITAL_COMMUNITY)
Admission: RE | Admit: 2019-10-27 | Discharge: 2019-10-27 | Disposition: A | Discharge status: Home / Self Care | Payer: Medicaid Other | Source: Ambulatory Visit | Attending: Hematology | Admitting: Hematology

## 2019-10-27 ENCOUNTER — Other Ambulatory Visit: Payer: Self-pay

## 2019-10-27 DIAGNOSIS — C3490 Malignant neoplasm of unspecified part of unspecified bronchus or lung: Secondary | ICD-10-CM | POA: Insufficient documentation

## 2019-10-27 MED ORDER — IOHEXOL 300 MG/ML  SOLN
100.0000 mL | Freq: Once | INTRAMUSCULAR | Status: AC | PRN
  Administered 2019-10-27: 100 mL via INTRAVENOUS

## 2019-10-31 ENCOUNTER — Other Ambulatory Visit: Payer: Self-pay | Admitting: Hematology

## 2019-11-01 ENCOUNTER — Inpatient Hospital Stay: Payer: Medicaid Other | Attending: Hematology

## 2019-11-01 ENCOUNTER — Inpatient Hospital Stay: Payer: Medicaid Other

## 2019-11-01 ENCOUNTER — Telehealth: Payer: Self-pay

## 2019-11-01 ENCOUNTER — Inpatient Hospital Stay: Payer: Medicaid Other | Admitting: Nurse Practitioner

## 2019-11-01 DIAGNOSIS — Z79899 Other long term (current) drug therapy: Secondary | ICD-10-CM | POA: Insufficient documentation

## 2019-11-01 DIAGNOSIS — Z5112 Encounter for antineoplastic immunotherapy: Secondary | ICD-10-CM | POA: Insufficient documentation

## 2019-11-01 DIAGNOSIS — Z5111 Encounter for antineoplastic chemotherapy: Secondary | ICD-10-CM | POA: Insufficient documentation

## 2019-11-01 DIAGNOSIS — C7931 Secondary malignant neoplasm of brain: Secondary | ICD-10-CM | POA: Insufficient documentation

## 2019-11-01 DIAGNOSIS — C770 Secondary and unspecified malignant neoplasm of lymph nodes of head, face and neck: Secondary | ICD-10-CM | POA: Insufficient documentation

## 2019-11-01 DIAGNOSIS — Z23 Encounter for immunization: Secondary | ICD-10-CM | POA: Insufficient documentation

## 2019-11-01 DIAGNOSIS — C3412 Malignant neoplasm of upper lobe, left bronchus or lung: Secondary | ICD-10-CM | POA: Insufficient documentation

## 2019-11-01 DIAGNOSIS — C781 Secondary malignant neoplasm of mediastinum: Secondary | ICD-10-CM | POA: Insufficient documentation

## 2019-11-01 NOTE — Telephone Encounter (Signed)
Called patient about missing appointment today. No answer. Left a message for her to call the [facility]

## 2019-11-01 NOTE — Progress Notes (Deleted)
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 Malignant appearing left mediastinal and hilar lymphadenopathy narrowing the airways at the left hilum. In the left lung only a subtle 9 mm spiculated nodule is identified in the lingula. Consider small cell carcinoma. Bronchoscopy or mediastinal node sampling might be most valuable for diagnosis. 2. Indeterminate although low-density (which typically which indicates a benign adenoma) left adrenal nodule. 3.  Negative for acute pulmonary embolus.   08/24/2018 Definitive Surgery   Bronchoscopy and EBUS biopsy on 08/24/18 by Dr. Loanne Drilling   08/24/2018 Initial Biopsy   Diagnosis 08/24/18 FINE NEEDLE ASPIRATION, ENDOSCOPIC, (EBUS) STATION # 7 LYMPH NODE(SPECIMEN 1 OF 4 COLLECTED 08/24/18): NO MALIGNANT CELLS IDENTIFIED.  FINE NEEDLE ASPIRATION, ENDOSCOPIC, (EBUS) LEFT HILAR LYMPH NODE(SPECIMEN 2 OF 4 COLLECTED 08/24/18): NO MALIGNANT CELLS IDENTIFIED.  TRANSBRONCHIAL NEEDLE ASPIRATION, WANG, (SPECIMEN 3 OF 4 COLLECTED 08/24/18): NO MALIGNANT CELLS IDENTIFIED.  BRONCHIAL BRUSHING, LUL (SPECIMEN 4 OF 4 COLLECTED 08/24/18): MALIGNANT CELLS PRESENT, MOST CONSISTENT WITH SMALL CELL CARCINOMA. SEE COMMENT.   08/24/2018 Cancer Staging   Staging form: Lung, AJCC 8th Edition - Clinical stage from 08/24/2018: Stage IIIA (cT1a, cN2, cM0) - Signed by [name], MD on 10/17/2018   08/26/2018  Initial Diagnosis   Small cell lung cancer, left ([place])   08/26/2018 Imaging   CT AP W Contrast 08/26/18  IMPRESSION: 1. 2.9 cm left adrenal nodule cannot be definitively characterized on this study. Potentially a lipid poor adenoma, but metastatic disease not excluded. MRI of the abdomen with and without contrast may prove helpful to further evaluate. 2. No other findings to suggest metastatic disease in the abdomen/pelvis. 3. Left base atelectasis with small left pleural effusion.   08/27/2018 Imaging   MRI brain 08/27/18  IMPRESSION: 1. Motion degraded, incomplete examination. 2. Subcentimeter focus of diffusion signal abnormality in the left cerebellum. No enhancement is evident to strongly suggest a metastasis, and this may reflect an acute to subacute small vessel infarct. 3. No evidence of intracranial metastatic disease elsewhere. 4. Given motion artifact and incomplete postcontrast imaging, consider short-term follow-up MRI (possibly with sedation) to further evaluate the left cerebellar abnormality and provide a more thorough evaluation for metastatic disease.   09/02/2018 PET scan   IMPRESSION: 1. Hypermetabolic left hilar mass is identified encasing the left mainstem bronchus resulting in postobstructive atelectasis in the left upper lobe. 2. Hypermetabolic left mediastinal nodal metastasis. 3. Loculated left pleural effusion is identified with mild increased FDG uptake. Cannot rule out malignant pleural effusion. 4. No evidence for metastatic disease to the abdomen or pelvis or skeletal structures.     09/07/2018 - 11/10/2018 Chemotherapy   Concurrent chemoRT with IV cisplatin on day 1 and etopside day 1-3 for every 3 weeks starting 8/11. Will start radiation with Dr. Lisbeth Renshaw with cycle 2. She completed 4 cycles on 11/10/18   09/14/2018 Procedure   Thoracentesis yielding 750 mL   09/28/2018 - 12/09/2019 Radiation  Therapy   Concurrent chemoRT with Dr. Lisbeth Renshaw. Start with  cycle 2 chemo on 09/28/18 and completed on 12/09/18.    02/10/2019 Imaging   CT CAP W Contrast  IMPRESSION: 1. Interval response to therapy. Significant interval reduction in tumor burden involving the left hemithorax. 2. No new or progressive disease identified within the chest, abdomen or pelvis. 3. Emphysema and aortic atherosclerosis. 4. Stable left adrenal nodule.   Aortic Atherosclerosis (ICD10-I70.0) and Emphysema (ICD10-J43.9).   02/11/2019 Imaging   MRI Brain  IMPRESSION: 6 x 10 mm solitary metastatic deposit right frontal lobe with minimal edema.   02/28/2019 - 03/14/2019 Radiation Therapy   Target Brain Radiation with Dr. Lisbeth Renshaw    05/19/2019 Imaging   CT CAP w contrast  IMPRESSION: 1. Trace residual soft tissue thickening at the left lung apex and along the AP window. Slight increase in size of a low left paratracheal lymph node. Continued attention on follow-up exams is warranted. No evidence of distant metastatic disease. 2. Subpleural density in the posterior left upper lobe is new and may be due to atelectasis. Recommend attention on follow-up. 3. Left adrenal adenoma. 4. Aortic atherosclerosis (ICD10-I70.0). Coronary artery calcification. 5.  Emphysema (ICD10-J43.9).   07/13/2019 Imaging   MRI brain  IMPRESSION: 1. Increased size of right frontal lobe metastasis with new moderate edema. 2. No evidence of new intracranial metastases.     08/10/2019 Imaging   CT CAP W Contrast  IMPRESSION: 1. New and progressive adenopathy within the mediastinum and low left neck, consistent with nodal metastasis. 2. No evidence of pulmonary metastasis. Similar areas of probable scarring and pleural thickening. 3. No subdiaphragmatic metastatic disease identified. 4. Left adrenal adenoma. 5. Age advanced aortic atherosclerosis (ICD10-I70.0) and emphysema (ICD10-J43.9).     08/19/2019 Imaging   Ct Angio Chest  IMPRESSION: 1. No evidence of pulmonary embolus. 2. Stable  nodal metastasis within the mediastinum and left internal jugular chain. 3. Interval development of mild interlobular septal thickening and ground-glass opacity, greatest in the right upper lobe and left lower lobe. Differential includes asymmetric edema versus atypical infection. 4. Aortic Atherosclerosis (ICD10-I70.0) and Emphysema (ICD10-J43.9).     08/25/2019 Imaging   MRI Brain  IMPRESSION: Increased size of centrally necrotic enhancing right frontal lesion measuring 4.0 cm, previously 2.6 cm.   White matter edema and dural thickening/enhancement involving the right frontal convexity is more conspicuous than prior exam.   No new enhancing lesion.   09/01/2019 Procedure   PAC placement    09/19/2019 Imaging   MRI brain  IMPRESSION: 1. Postoperative changes from interval right frontal craniotomy for resection of previously identified right frontal lobe mass. Somewhat ill-defined parenchymal enhancement about the resection cavity favored to reflect postoperative changes. No definite residual tumor seen on this immediate postoperative scan. 2. Small 1.2 cm infarct at the deep margin of the resection cavity. 3. Interval increase in T2/FLAIR signal intensity involving the adjacent right frontal region with increased regional mass effect and up to 6 mm of right-to-left shift.   09/19/2019 Surgery   Right CRANIOTOMY TUMOR EXCISION, FRONTAL and APPLICATION OF CRANIAL NAVIGATION by Dr Kathyrn Sheriff   09/19/2019 Pathology Results   FINAL MICROSCOPIC DIAGNOSIS:   A. BRAIN TUMOR, RIGHT FRONTAL, EXCISION:  - Small cell carcinoma.   B. BRAIN TUMOR, RIGHT FRONTAL DURA, EXCISION:  - Small cell carcinoma.    COMMENT:   CK8/18 (dot-like), Synaptophysin and Chromogranin are positive.  CKAE1AE3, CK7 and Chromogranin are noncontributory.  Ki-67 proliferation  index reaches 70-80%.  The  morphology and immunophenotype are compatible  with the provided clinical history of small cell  carcinoma.  Dr. Melina Copa  reviewed.    10/24/2019 -  Chemotherapy   First-line chemo Carboplatin day 1, Etopside day 1-3 with immunotherapy Tecentriq day 1 q3weeks starting 10/24/19   10/27/2019 Imaging   IMPRESSION: 1. Significant interval enlargement of bulky mediastinal and left hilar lymphadenopathy. There are new pleural nodules or prevascular lymph nodes. 2. Significant interval increase in masslike consolidation about the left hilum. There is interlobular septal thickening and scattered ground-glass and heterogeneous airspace opacity throughout the left lung, predominantly involving the left upper lobe and concerning for lymphangitic spread and postobstructive airspace disease. 3. There is somewhat nodular pleural thickening about the medial left upper lobe and a new small left pleural effusion. 4. Findings are consistent with worsened lung malignancy with pleural and nodal metastatic disease. 5. No significant interval change in a left adrenal mass, likely an incidental adenoma. Attention on follow-up. 6. No definite evidence of distant metastatic disease within the abdomen or pelvis. 7. Emphysema (ICD10-J43.9). 8. Aortic Atherosclerosis (ICD10-I70.0)     CURRENT THERAPY: Pending first line chemo carboplatin day 1, etoposide day 1 through 3 and immunotherapy atezolizumab day 1 every 3 weeks  INTERVAL HISTORY: Ms. Askren returns for follow-up and to start treatment as scheduled.  She postpone her chemo from last week until restaging could be done. She underwent CT CAP for new baseline on 10/27/2019.    REVIEW OF SYSTEMS:   Constitutional: Denies fevers, chills or abnormal weight loss Eyes: Denies blurriness of vision Ears, nose, mouth, throat, and face: Denies mucositis or sore throat Respiratory: Denies cough, dyspnea or wheezes Cardiovascular: Denies palpitation, chest discomfort or lower extremity swelling Gastrointestinal:  Denies nausea, heartburn or change in bowel  habits Skin: Denies abnormal skin rashes Lymphatics: Denies new lymphadenopathy or easy bruising Neurological:Denies numbness, tingling or new weaknesses Behavioral/Psych: Mood is stable, no new changes  All other systems were reviewed with the patient and are negative.  MEDICAL HISTORY:  Past Medical History:  Diagnosis Date  . Asthma   . Chronic headaches   . COPD (chronic obstructive pulmonary disease) ([place])   . Depression   . Dyspnea    hospitalized July 2020  . Heart murmur   . Small cell lung cancer ([place])     SURGICAL HISTORY: Past Surgical History:  Procedure Laterality Date  . APPLICATION OF CRANIAL NAVIGATION Right 09/19/2019   Procedure: APPLICATION OF CRANIAL NAVIGATION;  Surgeon: Consuella Lose, MD;  Location: [facility];  Service: Neurosurgery;  Laterality: Right;  APPLICATION OF CRANIAL NAVIGATION  . BRONCHIAL BIOPSY  08/24/2018   Procedure: BRONCHIAL BIOPSIES;  Surgeon: Margaretha Seeds, MD;  Location: [facility];  Service: Cardiopulmonary;;  . BRONCHIAL BRUSHINGS  08/24/2018   Procedure: BRONCHIAL BRUSHINGS;  Surgeon: Margaretha Seeds, MD;  Location: [facility];  Service: Cardiopulmonary;;  . BRONCHIAL NEEDLE ASPIRATION BIOPSY  08/24/2018   Procedure: BRONCHIAL NEEDLE ASPIRATION BIOPSIES;  Surgeon: Margaretha Seeds, MD;  Location: [facility];  Service: Cardiopulmonary;;  . CRANIOTOMY Right 09/19/2019   Procedure: CRANIOTOMY TUMOR EXCISION, FRONTAL;  Surgeon: Consuella Lose, MD;  Location: [facility];  Service: Neurosurgery;  Laterality: Right;  CRANIOTOMY TUMOR EXCISION, FRONTAL  . ENDOBRONCHIAL ULTRASOUND N/A 08/24/2018   Procedure: ENDOBRONCHIAL ULTRASOUND;  Surgeon: Margaretha Seeds, MD;  Location: [facility];  Service: Cardiopulmonary;  Laterality: N/A;  . IR IMAGING GUIDED PORT INSERTION  09/01/2019  . IR THORACENTESIS ASP PLEURAL SPACE W/IMG GUIDE  09/14/2018  .  TUBAL LIGATION    . VIDEO BRONCHOSCOPY  08/24/2018   Procedure: VIDEO BRONCHOSCOPY;  Surgeon:  Margaretha Seeds, MD;  Location: [facility];  Service: Cardiopulmonary;;    I have reviewed the social history and family history with the patient and they are unchanged from previous note.  ALLERGIES:  is allergic to penicillins.  MEDICATIONS:  Current Outpatient Medications  Medication Sig Dispense Refill  . acetaminophen (TYLENOL) 325 MG tablet Take 1,300 mg by mouth every 6 (six) hours as needed for mild pain or moderate pain.    . baclofen (LIORESAL) 10 MG tablet Take 1 tablet (10 mg total) by mouth 3 (three) times daily as needed for muscle spasms. 30 each 0  . diazepam (VALIUM) 10 MG tablet Take 1 tablet po 30 minutes prior to MRI or radiation (Patient taking differently: Take 20 mg by mouth See admin instructions. Take 1-2 tablets by mouth 30 minutes prior to MRI or radiation) 5 tablet 0  . dronabinol (MARINOL) 5 MG capsule Take 1 capsule (5 mg total) by mouth 2 (two) times daily before a meal. 60 capsule 0  . DULoxetine (CYMBALTA) 20 MG capsule Take 2 capsules (40 mg total) by mouth daily. Take 1 capsule by mouth daily for the first week, then increase to 2 caps daily (Patient taking differently: Take 40 mg by mouth daily. ) 60 capsule 1  . gabapentin (NEURONTIN) 300 MG capsule Take 1 tab in morning and 2 tabs at bedtime 90 capsule 2  . lidocaine (LIDODERM) 5 % Apply one patch to the area behind the affected ear as needed for pain (Patient taking differently: Place 1 patch onto the skin daily. ) 30 patch 0  . LORazepam (ATIVAN) 1 MG tablet Take 1 tablet (1 mg total) by mouth every 12 (twelve) hours as needed for anxiety. 30 tablet 0  . Oxycodone HCl 20 MG TABS Take 1 tablet (20 mg total) by mouth 3 (three) times daily as needed. 90 tablet 0  . promethazine (PHENERGAN) 25 MG tablet Take 1 tablet (25 mg total) by mouth every 6 (six) hours as needed for nausea or vomiting. 30 tablet 1   No current facility-administered medications for this visit.    PHYSICAL EXAMINATION: ECOG  PERFORMANCE STATUS: {CHL ONC ECOG PS:[phone]}  There were no vitals filed for this visit. There were no vitals filed for this visit.  GENERAL:alert, no distress and comfortable SKIN: skin color, texture, turgor are normal, no rashes or significant lesions EYES: normal, Conjunctiva are pink and non-injected, sclera clear OROPHARYNX:no exudate, no erythema and lips, buccal mucosa, and tongue normal  NECK: supple, thyroid normal size, non-tender, without nodularity LYMPH:  no palpable lymphadenopathy in the cervical, axillary or inguinal LUNGS: clear to auscultation and percussion with normal breathing effort HEART: regular rate & rhythm and no murmurs and no lower extremity edema ABDOMEN:abdomen soft, non-tender and normal bowel sounds Musculoskeletal:no cyanosis of digits and no clubbing  NEURO: alert & oriented x 3 with fluent speech, no focal motor/sensory deficits  LABORATORY DATA:  I have reviewed the data as listed CBC Latest Ref Rng & Units 09/19/2019 09/01/2019 08/19/2019  WBC 4.0 - 10.5 K/uL 7.0 6.9 9.3  Hemoglobin 12.0 - 15.0 g/dL 13.2 14.3 13.6  Hematocrit 36 - 46 % 42.0 44.7 43.4  Platelets 150 - 400 K/uL 305 300 328     CMP Latest Ref Rng & Units 09/19/2019 08/19/2019 08/18/2019  Glucose 70 - 99 mg/dL 118(H) 120(H) 96  BUN 6 - 20 mg/dL  31(H) 28(H) 29(H)  Creatinine 0.44 - 1.00 mg/dL 0.72 0.75 1.23(H)  Sodium 135 - 145 mmol/L 143 142 145  Potassium 3.5 - 5.1 mmol/L 3.4(L) 4.0 4.6  Chloride 98 - 111 mmol/L 108 109 106  CO2 22 - 32 mmol/L '24 22 28  ' Calcium 8.9 - 10.3 mg/dL 10.0 9.7 9.5  Total Protein 6.5 - 8.1 g/dL - - 7.4  Total Bilirubin 0.3 - 1.2 mg/dL - - <0.1(L)  Alkaline Phos 38 - 126 U/L - - 67  AST 15 - 41 U/L - - 19  ALT 0 - 44 U/L - - 28      RADIOGRAPHIC STUDIES: I have personally reviewed the radiological images as listed and agreed with the findings in the report. No results found.   ASSESSMENT & PLAN:  No problem-specific Assessment & Plan notes  found for this encounter.   No orders of the defined types were placed in this encounter.  All questions were answered. The patient knows to call the clinic with any problems, questions or concerns. No barriers to learning was detected. I spent {CHL ONC TIME VISIT - SHFWY:[id number]} counseling the patient face to face. The total time spent in the appointment was {CHL ONC TIME VISIT - DXAJO:[id number]} and more than 50% was on counseling and review of test results     Alla Feeling, NP 11/01/19

## 2019-11-02 ENCOUNTER — Inpatient Hospital Stay: Payer: Medicaid Other

## 2019-11-03 ENCOUNTER — Other Ambulatory Visit: Payer: Self-pay | Admitting: Hematology

## 2019-11-03 ENCOUNTER — Ambulatory Visit: Payer: Medicaid Other

## 2019-11-03 DIAGNOSIS — C3492 Malignant neoplasm of unspecified part of left bronchus or lung: Secondary | ICD-10-CM

## 2019-11-04 ENCOUNTER — Telehealth: Payer: Self-pay | Admitting: Hematology

## 2019-11-04 ENCOUNTER — Ambulatory Visit: Payer: Medicaid Other

## 2019-11-04 NOTE — Telephone Encounter (Signed)
Rescheduled per 10/6 sch message. Unable to reach pt. Left voicemail with appt times and dates.

## 2019-11-07 ENCOUNTER — Inpatient Hospital Stay: Payer: Medicaid Other

## 2019-11-07 ENCOUNTER — Other Ambulatory Visit: Payer: Self-pay | Admitting: Oncology

## 2019-11-07 ENCOUNTER — Inpatient Hospital Stay (HOSPITAL_BASED_OUTPATIENT_CLINIC_OR_DEPARTMENT_OTHER): Payer: Medicaid Other | Admitting: Nurse Practitioner

## 2019-11-07 ENCOUNTER — Telehealth: Payer: Self-pay | Admitting: Nurse Practitioner

## 2019-11-07 ENCOUNTER — Other Ambulatory Visit: Payer: Self-pay | Admitting: Nurse Practitioner

## 2019-11-07 ENCOUNTER — Other Ambulatory Visit: Payer: Self-pay

## 2019-11-07 ENCOUNTER — Encounter: Payer: Self-pay | Admitting: Nurse Practitioner

## 2019-11-07 VITALS — BP 137/76 | HR 95 | Temp 97.0°F | Resp 18 | Ht 62.0 in | Wt 156.3 lb

## 2019-11-07 DIAGNOSIS — C3412 Malignant neoplasm of upper lobe, left bronchus or lung: Secondary | ICD-10-CM

## 2019-11-07 DIAGNOSIS — Z95828 Presence of other vascular implants and grafts: Secondary | ICD-10-CM

## 2019-11-07 DIAGNOSIS — R519 Headache, unspecified: Secondary | ICD-10-CM

## 2019-11-07 DIAGNOSIS — Z7189 Other specified counseling: Secondary | ICD-10-CM

## 2019-11-07 DIAGNOSIS — C770 Secondary and unspecified malignant neoplasm of lymph nodes of head, face and neck: Secondary | ICD-10-CM | POA: Diagnosis not present

## 2019-11-07 DIAGNOSIS — C7931 Secondary malignant neoplasm of brain: Secondary | ICD-10-CM | POA: Diagnosis not present

## 2019-11-07 DIAGNOSIS — C3492 Malignant neoplasm of unspecified part of left bronchus or lung: Secondary | ICD-10-CM

## 2019-11-07 DIAGNOSIS — Z5111 Encounter for antineoplastic chemotherapy: Secondary | ICD-10-CM | POA: Diagnosis not present

## 2019-11-07 DIAGNOSIS — Z79899 Other long term (current) drug therapy: Secondary | ICD-10-CM | POA: Diagnosis not present

## 2019-11-07 DIAGNOSIS — Z5112 Encounter for antineoplastic immunotherapy: Secondary | ICD-10-CM | POA: Diagnosis not present

## 2019-11-07 DIAGNOSIS — C781 Secondary malignant neoplasm of mediastinum: Secondary | ICD-10-CM | POA: Diagnosis not present

## 2019-11-07 DIAGNOSIS — Z23 Encounter for immunization: Secondary | ICD-10-CM | POA: Diagnosis not present

## 2019-11-07 LAB — CMP (CANCER CENTER ONLY)
ALT: 13 U/L (ref 0–44)
AST: 16 U/L (ref 15–41)
Albumin: 3.3 g/dL — ABNORMAL LOW (ref 3.5–5.0)
Alkaline Phosphatase: 87 U/L (ref 38–126)
Anion gap: 5 (ref 5–15)
BUN: 16 mg/dL (ref 6–20)
CO2: 28 mmol/L (ref 22–32)
Calcium: 9.5 mg/dL (ref 8.9–10.3)
Chloride: 108 mmol/L (ref 98–111)
Creatinine: 0.77 mg/dL (ref 0.44–1.00)
GFR, Estimated: >60 mL/min (ref >60)
Glucose, Bld: 91 mg/dL (ref 70–99)
Potassium: 4 mmol/L (ref 3.5–5.1)
Sodium: 141 mmol/L (ref 135–145)
Total Bilirubin: <0.2 mg/dL — ABNORMAL LOW (ref 0.3–1.2)
Total Protein: 7.2 g/dL (ref 6.5–8.1)

## 2019-11-07 LAB — CBC WITH DIFFERENTIAL (CANCER CENTER ONLY)
Abs Immature Granulocytes: 0.03 10*3/uL (ref 0.00–0.07)
Basophils Absolute: 0.1 10*3/uL (ref 0.0–0.1)
Basophils Relative: 1 %
Eosinophils Absolute: 0.2 10*3/uL (ref 0.0–0.5)
Eosinophils Relative: 2 %
HCT: 38.6 % (ref 36.0–46.0)
Hemoglobin: 11.7 g/dL — ABNORMAL LOW (ref 12.0–15.0)
Immature Granulocytes: 0 %
Lymphocytes Relative: 8 %
Lymphs Abs: 0.7 10*3/uL (ref 0.7–4.0)
MCH: 27.5 pg (ref 26.0–34.0)
MCHC: 30.3 g/dL (ref 30.0–36.0)
MCV: 90.6 fL (ref 80.0–100.0)
Monocytes Absolute: 0.7 10*3/uL (ref 0.1–1.0)
Monocytes Relative: 7 %
Neutro Abs: 7.5 10*3/uL (ref 1.7–7.7)
Neutrophils Relative %: 82 %
Platelet Count: 446 10*3/uL — ABNORMAL HIGH (ref 150–400)
RBC: 4.26 MIL/uL (ref 3.87–5.11)
RDW: 14.5 % (ref 11.5–15.5)
WBC Count: 9.2 10*3/uL (ref 4.0–10.5)
nRBC: 0 % (ref 0.0–0.2)

## 2019-11-07 LAB — TSH: TSH: 1.815 u[IU]/mL (ref 0.308–3.960)

## 2019-11-07 MED ORDER — SODIUM CHLORIDE 0.9 % IV SOLN
105.0000 mg/m2 | Freq: Once | INTRAVENOUS | Status: AC
  Administered 2019-11-07: 190 mg via INTRAVENOUS
  Filled 2019-11-07: qty 9.5

## 2019-11-07 MED ORDER — SODIUM CHLORIDE 0.9 % IV SOLN
Freq: Once | INTRAVENOUS | Status: AC
  Filled 2019-11-07: qty 250

## 2019-11-07 MED ORDER — HEPARIN SOD (PORK) LOCK FLUSH 100 UNIT/ML IV SOLN
500.0000 [IU] | Freq: Once | INTRAVENOUS | Status: AC | PRN
  Administered 2019-11-07: 500 [IU]
  Filled 2019-11-07: qty 5

## 2019-11-07 MED ORDER — LIDOCAINE-PRILOCAINE 2.5-2.5 % EX CREA: TOPICAL_CREAM | CUTANEOUS | 3 refills | Status: DC

## 2019-11-07 MED ORDER — ONDANSETRON HCL 8 MG PO TABS: 8.0000 mg | ORAL_TABLET | Freq: Two times a day (BID) | ORAL | 1 refills | Status: DC | PRN

## 2019-11-07 MED ORDER — SODIUM CHLORIDE 0.9% FLUSH
10.0000 mL | INTRAVENOUS | Status: DC | PRN
  Administered 2019-11-07: 10 mL via INTRAVENOUS
  Filled 2019-11-07: qty 10

## 2019-11-07 MED ORDER — SODIUM CHLORIDE 0.9 % IV SOLN
10.0000 mg | Freq: Once | INTRAVENOUS | Status: AC
  Administered 2019-11-07: 10 mg via INTRAVENOUS
  Filled 2019-11-07: qty 10

## 2019-11-07 MED ORDER — PALONOSETRON HCL INJECTION 0.25 MG/5ML
INTRAVENOUS | Status: AC
  Filled 2019-11-07: qty 5

## 2019-11-07 MED ORDER — SODIUM CHLORIDE 0.9 % IV SOLN
150.0000 mg | Freq: Once | INTRAVENOUS | Status: AC
  Administered 2019-11-07: 150 mg via INTRAVENOUS
  Filled 2019-11-07: qty 150

## 2019-11-07 MED ORDER — PROCHLORPERAZINE MALEATE 10 MG PO TABS: 10.0000 mg | ORAL_TABLET | Freq: Four times a day (QID) | ORAL | 1 refills | Status: DC | PRN

## 2019-11-07 MED ORDER — ACETAMINOPHEN 325 MG PO TABS
ORAL_TABLET | ORAL | Status: AC
  Filled 2019-11-07: qty 2

## 2019-11-07 MED ORDER — SODIUM CHLORIDE 0.9 % IV SOLN
1200.0000 mg | Freq: Once | INTRAVENOUS | Status: AC
  Administered 2019-11-07: 1200 mg via INTRAVENOUS
  Filled 2019-11-07: qty 20

## 2019-11-07 MED ORDER — ACETAMINOPHEN 325 MG PO TABS
650.0000 mg | ORAL_TABLET | Freq: Four times a day (QID) | ORAL | Status: DC | PRN
  Administered 2019-11-07: 650 mg via ORAL

## 2019-11-07 MED ORDER — SODIUM CHLORIDE 0.9% FLUSH
10.0000 mL | INTRAVENOUS | Status: DC | PRN
  Administered 2019-11-07: 10 mL
  Filled 2019-11-07: qty 10

## 2019-11-07 MED ORDER — SODIUM CHLORIDE 0.9 % IV SOLN
604.5000 mg | Freq: Once | INTRAVENOUS | Status: AC
  Administered 2019-11-07: 600 mg via INTRAVENOUS
  Filled 2019-11-07: qty 60

## 2019-11-07 MED ORDER — PALONOSETRON HCL INJECTION 0.25 MG/5ML
0.2500 mg | Freq: Once | INTRAVENOUS | Status: AC
  Administered 2019-11-07: 0.25 mg via INTRAVENOUS

## 2019-11-07 MED FILL — LIDOCAINE-PRILOCAINE CREAM: 2.5-2.5 | 14 days supply | Qty: 30 | Fill #0

## 2019-11-07 MED FILL — ONDANSETRON HCL 8 MG TABLET: 8 | 15 days supply | Qty: 30 | Fill #0

## 2019-11-07 MED FILL — PROCHLORPERAZINE 10 MG TAB: 10 | 7 days supply | Qty: 30 | Fill #0

## 2019-11-07 NOTE — Patient Instructions (Signed)

## 2019-11-07 NOTE — Telephone Encounter (Signed)
I called and spoke to Marissa [name] to confirm her schedule, she was unaware of today's appointments. She will call transportation after getting off the phone with me. She agrees to call back if she has any problems and appreciates the call.   Cira Rue, NP

## 2019-11-07 NOTE — Patient Instructions (Signed)
Marissa [name] Discharge Instructions for Patients Receiving Chemotherapy  Today you received the following chemotherapy agents: Atezolizumab (Tecentriq), Carboplatin, and Etoposide.  To help prevent nausea and vomiting after your treatment, we encourage you to take your nausea medication  as prescribed.    If you develop nausea and vomiting that is not controlled by your nausea medication, call the clinic.   BELOW ARE SYMPTOMS THAT SHOULD BE REPORTED IMMEDIATELY:  *FEVER GREATER THAN 100.5 F  *CHILLS WITH OR WITHOUT FEVER  NAUSEA AND VOMITING THAT IS NOT CONTROLLED WITH YOUR NAUSEA MEDICATION  *UNUSUAL SHORTNESS OF BREATH  *UNUSUAL BRUISING OR BLEEDING  TENDERNESS IN MOUTH AND THROAT WITH OR WITHOUT PRESENCE OF ULCERS  *URINARY PROBLEMS  *BOWEL PROBLEMS  UNUSUAL RASH Items with * indicate a potential emergency and should be followed up as soon as possible.  Feel free to call the clinic should you have any questions or concerns. The clinic phone number is [phone].  Please show the [facility] at check-in to the Emergency Department and triage nurse.

## 2019-11-07 NOTE — Progress Notes (Signed)
Marissa [name]   [phone] [phone]   Clinic Follow up Note   Patient Care Team: [name], MD as PCP - General (Hematology) 11/07/2019  CHIEF COMPLAINT: Follow-up extensive stage small cell lung cancer  SUMMARY OF ONCOLOGIC HISTORY: Oncology History Overview Note  Cancer Staging Small cell lung cancer, left upper lobe ([place]) Staging form: Lung, AJCC 8th Edition - Clinical stage from 08/24/2018: Stage IIIA (cT1a, cN2, cM0) - Signed by [name], MD on 10/17/2018    Small cell lung cancer, left upper lobe ([place])  07/29/2018 Imaging   CT Angio Chest 07/29/18  IMPRESSION: 1. Malignant appearing left mediastinal and hilar lymphadenopathy narrowing the airways at the left hilum. In the left lung only a subtle 9 mm spiculated nodule is identified in the lingula. Consider small cell carcinoma. Bronchoscopy or mediastinal node sampling might be most valuable for diagnosis. 2. Indeterminate although low-density (which typically which indicates a benign adenoma) left adrenal nodule. 3.  Negative for acute pulmonary embolus.   08/24/2018 Definitive Surgery   Bronchoscopy and EBUS biopsy on 08/24/18 by Dr. Loanne Drilling   08/24/2018 Initial Biopsy   Diagnosis 08/24/18 FINE NEEDLE ASPIRATION, ENDOSCOPIC, (EBUS) STATION # 7 LYMPH NODE(SPECIMEN 1 OF 4 COLLECTED 08/24/18): NO MALIGNANT CELLS IDENTIFIED.  FINE NEEDLE ASPIRATION, ENDOSCOPIC, (EBUS) LEFT HILAR LYMPH NODE(SPECIMEN 2 OF 4 COLLECTED 08/24/18): NO MALIGNANT CELLS IDENTIFIED.  TRANSBRONCHIAL NEEDLE ASPIRATION, WANG, (SPECIMEN 3 OF 4 COLLECTED 08/24/18): NO MALIGNANT CELLS IDENTIFIED.  BRONCHIAL BRUSHING, LUL (SPECIMEN 4 OF 4 COLLECTED 08/24/18): MALIGNANT CELLS PRESENT, MOST CONSISTENT WITH SMALL CELL CARCINOMA. SEE COMMENT.   08/24/2018 Cancer Staging   Staging form: Lung, AJCC 8th Edition - Clinical stage from 08/24/2018: Stage IIIA (cT1a, cN2, cM0) - Signed by [name], MD on 10/17/2018   08/26/2018  Initial Diagnosis   Small cell lung cancer, left ([place])   08/26/2018 Imaging   CT AP W Contrast 08/26/18  IMPRESSION: 1. 2.9 cm left adrenal nodule cannot be definitively characterized on this study. Potentially a lipid poor adenoma, but metastatic disease not excluded. MRI of the abdomen with and without contrast may prove helpful to further evaluate. 2. No other findings to suggest metastatic disease in the abdomen/pelvis. 3. Left base atelectasis with small left pleural effusion.   08/27/2018 Imaging   MRI brain 08/27/18  IMPRESSION: 1. Motion degraded, incomplete examination. 2. Subcentimeter focus of diffusion signal abnormality in the left cerebellum. No enhancement is evident to strongly suggest a metastasis, and this may reflect an acute to subacute small vessel infarct. 3. No evidence of intracranial metastatic disease elsewhere. 4. Given motion artifact and incomplete postcontrast imaging, consider short-term follow-up MRI (possibly with sedation) to further evaluate the left cerebellar abnormality and provide a more thorough evaluation for metastatic disease.   09/02/2018 PET scan   IMPRESSION: 1. Hypermetabolic left hilar mass is identified encasing the left mainstem bronchus resulting in postobstructive atelectasis in the left upper lobe. 2. Hypermetabolic left mediastinal nodal metastasis. 3. Loculated left pleural effusion is identified with mild increased FDG uptake. Cannot rule out malignant pleural effusion. 4. No evidence for metastatic disease to the abdomen or pelvis or skeletal structures.     09/07/2018 - 11/10/2018 Chemotherapy   Concurrent chemoRT with IV cisplatin on day 1 and etopside day 1-3 for every 3 weeks starting 8/11. Will start radiation with Dr. Lisbeth Renshaw with cycle 2. She completed 4 cycles on 11/10/18   09/14/2018 Procedure   Thoracentesis yielding 750 mL   09/28/2018 - 12/09/2019 Radiation Therapy  Concurrent chemoRT with Dr. Lisbeth Renshaw. Start with  cycle 2 chemo on 09/28/18 and completed on 12/09/18.    02/10/2019 Imaging   CT CAP W Contrast  IMPRESSION: 1. Interval response to therapy. Significant interval reduction in tumor burden involving the left hemithorax. 2. No new or progressive disease identified within the chest, abdomen or pelvis. 3. Emphysema and aortic atherosclerosis. 4. Stable left adrenal nodule.   Aortic Atherosclerosis (ICD10-I70.0) and Emphysema (ICD10-J43.9).   02/11/2019 Imaging   MRI Brain  IMPRESSION: 6 x 10 mm solitary metastatic deposit right frontal lobe with minimal edema.   02/28/2019 - 03/14/2019 Radiation Therapy   Target Brain Radiation with Dr. Lisbeth Renshaw    05/19/2019 Imaging   CT CAP w contrast  IMPRESSION: 1. Trace residual soft tissue thickening at the left lung apex and along the AP window. Slight increase in size of a low left paratracheal lymph node. Continued attention on follow-up exams is warranted. No evidence of distant metastatic disease. 2. Subpleural density in the posterior left upper lobe is new and may be due to atelectasis. Recommend attention on follow-up. 3. Left adrenal adenoma. 4. Aortic atherosclerosis (ICD10-I70.0). Coronary artery calcification. 5.  Emphysema (ICD10-J43.9).   07/13/2019 Imaging   MRI brain  IMPRESSION: 1. Increased size of right frontal lobe metastasis with new moderate edema. 2. No evidence of new intracranial metastases.     08/10/2019 Imaging   CT CAP W Contrast  IMPRESSION: 1. New and progressive adenopathy within the mediastinum and low left neck, consistent with nodal metastasis. 2. No evidence of pulmonary metastasis. Similar areas of probable scarring and pleural thickening. 3. No subdiaphragmatic metastatic disease identified. 4. Left adrenal adenoma. 5. Age advanced aortic atherosclerosis (ICD10-I70.0) and emphysema (ICD10-J43.9).     08/19/2019 Imaging   Ct Angio Chest  IMPRESSION: 1. No evidence of pulmonary embolus. 2. Stable  nodal metastasis within the mediastinum and left internal jugular chain. 3. Interval development of mild interlobular septal thickening and ground-glass opacity, greatest in the right upper lobe and left lower lobe. Differential includes asymmetric edema versus atypical infection. 4. Aortic Atherosclerosis (ICD10-I70.0) and Emphysema (ICD10-J43.9).     08/25/2019 Imaging   MRI Brain  IMPRESSION: Increased size of centrally necrotic enhancing right frontal lesion measuring 4.0 cm, previously 2.6 cm.   White matter edema and dural thickening/enhancement involving the right frontal convexity is more conspicuous than prior exam.   No new enhancing lesion.   09/01/2019 Procedure   PAC placement    09/19/2019 Imaging   MRI brain  IMPRESSION: 1. Postoperative changes from interval right frontal craniotomy for resection of previously identified right frontal lobe mass. Somewhat ill-defined parenchymal enhancement about the resection cavity favored to reflect postoperative changes. No definite residual tumor seen on this immediate postoperative scan. 2. Small 1.2 cm infarct at the deep margin of the resection cavity. 3. Interval increase in T2/FLAIR signal intensity involving the adjacent right frontal region with increased regional mass effect and up to 6 mm of right-to-left shift.   09/19/2019 Surgery   Right CRANIOTOMY TUMOR EXCISION, FRONTAL and APPLICATION OF CRANIAL NAVIGATION by Dr Kathyrn Sheriff   09/19/2019 Pathology Results   FINAL MICROSCOPIC DIAGNOSIS:   A. BRAIN TUMOR, RIGHT FRONTAL, EXCISION:  - Small cell carcinoma.   B. BRAIN TUMOR, RIGHT FRONTAL DURA, EXCISION:  - Small cell carcinoma.    COMMENT:   CK8/18 (dot-like), Synaptophysin and Chromogranin are positive.  CKAE1AE3, CK7 and Chromogranin are noncontributory.  Ki-67 proliferation  index reaches 70-80%.  The morphology and immunophenotype  are compatible  with the provided clinical history of small cell  carcinoma.  Dr. Melina Copa  reviewed.    10/24/2019 -  Chemotherapy   First-line chemo Carboplatin day 1, Etopside day 1-3 with immunotherapy Tecentriq day 1 q3weeks starting 10/24/19   10/27/2019 Imaging   IMPRESSION: 1. Significant interval enlargement of bulky mediastinal and left hilar lymphadenopathy. There are new pleural nodules or prevascular lymph nodes. 2. Significant interval increase in masslike consolidation about the left hilum. There is interlobular septal thickening and scattered ground-glass and heterogeneous airspace opacity throughout the left lung, predominantly involving the left upper lobe and concerning for lymphangitic spread and postobstructive airspace disease. 3. There is somewhat nodular pleural thickening about the medial left upper lobe and a new small left pleural effusion. 4. Findings are consistent with worsened lung malignancy with pleural and nodal metastatic disease. 5. No significant interval change in a left adrenal mass, likely an incidental adenoma. Attention on follow-up. 6. No definite evidence of distant metastatic disease within the abdomen or pelvis. 7. Emphysema (ICD10-J43.9). 8. Aortic Atherosclerosis (ICD10-I70.0)     CURRENT THERAPY: First-line chemo Carboplatin, Etopside day 1-3 with immunotherapy Tecentriq q3weeks starting 11/07/19   INTERVAL HISTORY: Marissa [name] returns for follow-up and to start treatment as scheduled.  Her visit last week was postponed, she was not aware of her appointments and did not answer when we called her.  Today Marissa [name] presents by herself.  She has intermittent dizziness and right frontal headache, and right periorbital swelling since surgery. She continues to have chronic pain in the right knee which gave out on her a couple weeks ago and she had a controlled fall, did not hit anything or cause injury.  She is planning to move with her boyfriend to a more secure apartment soon.  She is eating and drinking,  mostly soft food because her jaw occasionally locks up.  Nausea with rare vomiting is controlled with Compazine, bowels moving normally.  She drinks 5-6 bottles of liquid per day.  She has increased cough, stable dyspnea; no fever or chills.  Her O2 requirement has not changed.  She has not used oxygen or breathing treatment in a month or so.   MEDICAL HISTORY:  Past Medical History:  Diagnosis Date  . Asthma   . Chronic headaches   . COPD (chronic obstructive pulmonary disease) ([place])   . Depression   . Dyspnea    hospitalized July 2020  . Heart murmur   . Small cell lung cancer ([place])     SURGICAL HISTORY: Past Surgical History:  Procedure Laterality Date  . APPLICATION OF CRANIAL NAVIGATION Right 09/19/2019   Procedure: APPLICATION OF CRANIAL NAVIGATION;  Surgeon: Consuella Lose, MD;  Location: [facility];  Service: Neurosurgery;  Laterality: Right;  APPLICATION OF CRANIAL NAVIGATION  . BRONCHIAL BIOPSY  08/24/2018   Procedure: BRONCHIAL BIOPSIES;  Surgeon: Margaretha Seeds, MD;  Location: [facility];  Service: Cardiopulmonary;;  . BRONCHIAL BRUSHINGS  08/24/2018   Procedure: BRONCHIAL BRUSHINGS;  Surgeon: Margaretha Seeds, MD;  Location: [facility];  Service: Cardiopulmonary;;  . BRONCHIAL NEEDLE ASPIRATION BIOPSY  08/24/2018   Procedure: BRONCHIAL NEEDLE ASPIRATION BIOPSIES;  Surgeon: Margaretha Seeds, MD;  Location: [facility];  Service: Cardiopulmonary;;  . CRANIOTOMY Right 09/19/2019   Procedure: CRANIOTOMY TUMOR EXCISION, FRONTAL;  Surgeon: Consuella Lose, MD;  Location: [facility];  Service: Neurosurgery;  Laterality: Right;  CRANIOTOMY TUMOR EXCISION, FRONTAL  . ENDOBRONCHIAL ULTRASOUND N/A 08/24/2018   Procedure: ENDOBRONCHIAL ULTRASOUND;  Surgeon: Loanne Drilling,  Darlis Loan, MD;  Location: [facility];  Service: Cardiopulmonary;  Laterality: N/A;  . IR IMAGING GUIDED PORT INSERTION  09/01/2019  . IR THORACENTESIS ASP PLEURAL SPACE W/IMG GUIDE  09/14/2018  . TUBAL LIGATION    .  VIDEO BRONCHOSCOPY  08/24/2018   Procedure: VIDEO BRONCHOSCOPY;  Surgeon: Margaretha Seeds, MD;  Location: [facility];  Service: Cardiopulmonary;;    I have reviewed the social history and family history with the patient and they are unchanged from previous note.  ALLERGIES:  is allergic to penicillins.  MEDICATIONS:  Current Outpatient Medications  Medication Sig Dispense Refill  . acetaminophen (TYLENOL) 325 MG tablet Take 1,300 mg by mouth every 6 (six) hours as needed for mild pain or moderate pain.    . baclofen (LIORESAL) 10 MG tablet Take 1 tablet (10 mg total) by mouth 3 (three) times daily as needed for muscle spasms. 30 each 0  . diazepam (VALIUM) 10 MG tablet Take 1 tablet po 30 minutes prior to MRI or radiation (Patient taking differently: Take 20 mg by mouth See admin instructions. Take 1-2 tablets by mouth 30 minutes prior to MRI or radiation) 5 tablet 0  . dronabinol (MARINOL) 5 MG capsule Take 1 capsule (5 mg total) by mouth 2 (two) times daily before a meal. 60 capsule 0  . DULoxetine (CYMBALTA) 20 MG capsule Take 2 capsules (40 mg total) by mouth daily. Take 1 capsule by mouth daily for the first week, then increase to 2 caps daily (Patient taking differently: Take 40 mg by mouth daily. ) 60 capsule 1  . gabapentin (NEURONTIN) 300 MG capsule Take 1 tab in morning and 2 tabs at bedtime 90 capsule 2  . lidocaine (LIDODERM) 5 % Apply one patch to the area behind the affected ear as needed for pain (Patient taking differently: Place 1 patch onto the skin daily. ) 30 patch 0  . LORazepam (ATIVAN) 1 MG tablet Take 1 tablet (1 mg total) by mouth every 12 (twelve) hours as needed for anxiety. 30 tablet 0  . Oxycodone HCl 20 MG TABS Take 1 tablet (20 mg total) by mouth 3 (three) times daily as needed. 90 tablet 0  . lidocaine-prilocaine (EMLA) cream Apply to affected area once 30 g 3  . ondansetron (ZOFRAN) 8 MG tablet Take 1 tablet (8 mg total) by mouth 2 (two) times daily as needed  for refractory nausea / vomiting. Start on day 3 after carboplatin chemo. 30 tablet 1  . prochlorperazine (COMPAZINE) 10 MG tablet Take 1 tablet (10 mg total) by mouth every 6 (six) hours as needed (Nausea or vomiting). 30 tablet 1   No current facility-administered medications for this visit.   Facility-Administered Medications Ordered in Other Visits  Medication Dose Route Frequency Provider Last Rate Last Admin  . atezolizumab (TECENTRIQ) 1,200 mg in sodium chloride 0.9 % 250 mL chemo infusion  1,200 mg Intravenous Once Ladell Pier, MD      . CARBOplatin (PARAPLATIN) 600 mg in sodium chloride 0.9 % 250 mL chemo infusion  600 mg Intravenous Once Betsy Coder B, MD      . dexamethasone (DECADRON) 10 mg in sodium chloride 0.9 % 50 mL IVPB  10 mg Intravenous Once Ladell Pier, MD 204 mL/hr at 11/07/19 1431 10 mg at 11/07/19 1431  . etoposide (VEPESID) 190 mg in sodium chloride 0.9 % 500 mL chemo infusion  105 mg/m2 (Treatment Plan Recorded) Intravenous Once Ladell Pier, MD      .  fosaprepitant (EMEND) 150 mg in sodium chloride 0.9 % 145 mL IVPB  150 mg Intravenous Once Betsy Coder B, MD      . heparin lock flush 100 unit/mL  500 Units Intracatheter Once PRN Ladell Pier, MD      . sodium chloride flush (NS) 0.9 % injection 10 mL  10 mL Intracatheter PRN Ladell Pier, MD        PHYSICAL EXAMINATION: ECOG PERFORMANCE STATUS: 1 - Symptomatic but completely ambulatory  Vitals:   11/07/19 1224  BP: 137/76  Pulse: 95  Resp: 18  Temp: (!) 97 F (36.1 C)  SpO2: 99%   Filed Weights   11/07/19 1224  Weight: 156 lb 4.8 oz (70.9 kg)    GENERAL:alert, no distress and comfortable SKIN: No rash to exposed skin EYES: Right periorbital edema, sclera clear, no discharge OROPHARYNX: No thrush or ulcers NECK: Fullness in the right low neck/supraclavicular area LUNGS: Inspirational wheeze in the right lung, otherwise clear, normal breathing effort HEART: regular rate &  rhythm, no lower extremity edema ABDOMEN:abdomen soft, non-tender and normal bowel sounds NEURO: alert & oriented x 3 with fluent speech, steady gait PAC without erythema  LABORATORY DATA:  I have reviewed the data as listed CBC Latest Ref Rng & Units 11/07/2019 09/19/2019 09/01/2019  WBC 4.0 - 10.5 K/uL 9.2 7.0 6.9  Hemoglobin 12.0 - 15.0 g/dL 11.7(L) 13.2 14.3  Hematocrit 36 - 46 % 38.6 42.0 44.7  Platelets 150 - 400 K/uL 446(H) 305 300     CMP Latest Ref Rng & Units 11/07/2019 09/19/2019 08/19/2019  Glucose 70 - 99 mg/dL 91 118(H) 120(H)  BUN 6 - 20 mg/dL 16 31(H) 28(H)  Creatinine 0.44 - 1.00 mg/dL 0.77 0.72 0.75  Sodium 135 - 145 mmol/L 141 143 142  Potassium 3.5 - 5.1 mmol/L 4.0 3.4(L) 4.0  Chloride 98 - 111 mmol/L 108 108 109  CO2 22 - 32 mmol/L _0 Calcium 8.9 - 10.3 mg/dL 9.5 10.0 9.7  Total Protein 6.5 - 8.1 g/dL 7.2 - -  Total Bilirubin 0.3 - 1.2 mg/dL <0.2(L) - -  Alkaline Phos 38 - 126 U/L 87 - -  AST 15 - 41 U/L 16 - -  ALT 0 - 44 U/L 13 - -      RADIOGRAPHIC STUDIES: I have personally reviewed the radiological images as listed and agreed with the findings in the report. No results found.   ASSESSMENT & PLAN: Marissa [name] is a 54 y.o. female with    1.Small cell lung cancer, limited stagein 07/2018,brain metastasis in 01/2019, mediastinal node recurrence in 07/2019 -She was diagnosed in 07/2018. Given her locally advanced disease (stage III cancer), she underwent standard treatment with concurrent chemoRT with 4 cycles of cisplatin/etopside.  -Unfortunately her Brain MRI from 02/11/19 showed 6x10 mm solitary brain metastasis in right frontal lobe. She completed radiation to brain 02/28/19-03/14/19 with Dr. Lisbeth Renshaw. -Her cancer progressed on CT CAP from 08/10/19 which showed new and progressive adenopathy within the mediastinum and low left neck consistent with nodal metastasis. No evidence of pulmonary or other distant metastasis. -Given symptomatic brain  metastasis, she underwent right craniotomy tumor resection on 09/19/19 by Dr. Kathyrn Sheriff.  She recovered well,  off steroids now, residual right frontal headache and mild dizziness are stable -Postop MRI showed no residual disease, continued surrounding edema with local mass-effect and midline shift -She underwent restaging CT CAP on 9/30 for new baseline which showed significant progression of pulmonary and nodal  metastasis in the chest, no definite metastatic disease in the abdomen or pelvis -Given recurrent/progressive disease, the recommendation is to restart chemo with IV Carboplatin on day 1, Etopside day 1-3 with immunotherapy Tecentriq on day 1 q3weeks to control her disease.  The goal is palliative  2. Headaches, secondary to brain metastasis, S/p resection.  -She underwent right craniotomy tumor excision with Dr Kathyrn Sheriff on 09/19/19. -Residual right frontal headache stable since surgery -Continue f/u with Dr. Mickeal Skinner.  3. Smoking Cessation, COPD,H/oacute left tension pneumothorax in 07/2018 after bronchoscopy and EBUS biopsy (resolved) -She has a h/o heavy smoking. She has COPD. Continue inhaler and nebulizer.  -Previously counseled to quit smoking but has not quit completely -CT shows disease progression in the chest, she has worsening cough, stable dyspnea.  She has not required oxygen or breathing treatment in few weeks  4. Severe LUQ and left flank pain, right knee pain  -followed by palliative care team Dr Hilma Favors.She remains on Oxycodone 86m q6hours, Cymbalta, and gabapentin.  -Her main pain is currently from her head at surgical site and her right knee.  -Denies LUQ pain today  5. Iron deficient anemia, S/p 2 IV Iron doses in 07/2018.Currently resolved off chemo   6. Anxiety/Depression, irritability, Social Support  -On Ativan and Valium as needed.   And admits to smoking marijuana -She will continue SW involvement. She has not seen MGilletteprovider yet.  -Currently on  Medicaid, disability, food stamps. She gets $529 per month. Still does not have a car and [facility] -Moving to her own apartment with boyfriend soon -Mood seems more positive lately  7. Goals of care -She knows her new brain metastasis makes her cancer stage IV and carries a poor prognosis.  -goal of her treatment is palliative  -she is full code now   Disposition: Ms. KKistappears stable.  We reviewed her restaging CT CAP from 9/30 which shows significant disease progression in the chest.  Recommendation remains to proceed with systemic treatment with carboplatin on day 1 and etoposide on days 1 through 3 in combination with immunotherapy atezolizumab on day 1 q. 21 days.  We again reviewed the goal of therapy is palliative, potential side effects, and symptom management.  Labs and performance status adequate to proceed with treatment today.  She will return on day 5 for Fulphila.  She will return for lab and toxicity check with me next week with potential supportive care if needed.  Case discussed and orders signed per Dr. SBenay Spice  All questions were answered. The patient knows to call the clinic with any problems, questions or concerns. No barriers to learning were detected. Total encounter time was 30 minutes.      LAlla Feeling NP 11/07/19

## 2019-11-08 ENCOUNTER — Other Ambulatory Visit: Payer: Self-pay

## 2019-11-08 ENCOUNTER — Telehealth: Payer: Self-pay | Admitting: Nurse Practitioner

## 2019-11-08 ENCOUNTER — Inpatient Hospital Stay: Payer: Medicaid Other

## 2019-11-08 VITALS — BP 109/59 | HR 96 | Temp 97.9°F | Resp 18

## 2019-11-08 DIAGNOSIS — Z23 Encounter for immunization: Secondary | ICD-10-CM

## 2019-11-08 DIAGNOSIS — C3412 Malignant neoplasm of upper lobe, left bronchus or lung: Secondary | ICD-10-CM

## 2019-11-08 DIAGNOSIS — Z5112 Encounter for antineoplastic immunotherapy: Secondary | ICD-10-CM | POA: Diagnosis not present

## 2019-11-08 DIAGNOSIS — Z7189 Other specified counseling: Secondary | ICD-10-CM

## 2019-11-08 MED ORDER — INFLUENZA VAC SPLIT QUAD 0.5 ML IM SUSY
0.5000 mL | PREFILLED_SYRINGE | Freq: Once | INTRAMUSCULAR | Status: AC
  Administered 2019-11-08: 0.5 mL via INTRAMUSCULAR

## 2019-11-08 MED ORDER — SODIUM CHLORIDE 0.9 % IV SOLN
Freq: Once | INTRAVENOUS | Status: AC
  Filled 2019-11-08: qty 250

## 2019-11-08 MED ORDER — HEPARIN SOD (PORK) LOCK FLUSH 100 UNIT/ML IV SOLN
500.0000 [IU] | Freq: Once | INTRAVENOUS | Status: AC | PRN
  Administered 2019-11-08: 500 [IU]
  Filled 2019-11-08: qty 5

## 2019-11-08 MED ORDER — SODIUM CHLORIDE 0.9 % IV SOLN
10.0000 mg | Freq: Once | INTRAVENOUS | Status: AC
  Administered 2019-11-08: 10 mg via INTRAVENOUS
  Filled 2019-11-08: qty 10

## 2019-11-08 MED ORDER — SODIUM CHLORIDE 0.9% FLUSH
10.0000 mL | INTRAVENOUS | Status: DC | PRN
  Administered 2019-11-08: 10 mL
  Filled 2019-11-08: qty 10

## 2019-11-08 MED ORDER — INFLUENZA VAC SPLIT QUAD 0.5 ML IM SUSY
PREFILLED_SYRINGE | INTRAMUSCULAR | Status: AC
  Filled 2019-11-08: qty 0.5

## 2019-11-08 MED ORDER — SODIUM CHLORIDE 0.9 % IV SOLN
100.0000 mg/m2 | Freq: Once | INTRAVENOUS | Status: AC
  Administered 2019-11-08: 180 mg via INTRAVENOUS
  Filled 2019-11-08: qty 9

## 2019-11-08 NOTE — Telephone Encounter (Signed)
Scheduled per 10/11 los. Unable to reach pt. Left voicemail with appt times and dates. Noted to give pt appt calendar for todays visit.

## 2019-11-08 NOTE — Patient Instructions (Signed)
[facility] Discharge Instructions for Patients Receiving Chemotherapy  Today you received the following chemotherapy agents: etoposide  To help prevent nausea and vomiting after your treatment, we encourage you to take your nausea medication as directed.   If you develop nausea and vomiting that is not controlled by your nausea medication, call the clinic.   BELOW ARE SYMPTOMS THAT SHOULD BE REPORTED IMMEDIATELY:  *FEVER GREATER THAN 100.5 F  *CHILLS WITH OR WITHOUT FEVER  NAUSEA AND VOMITING THAT IS NOT CONTROLLED WITH YOUR NAUSEA MEDICATION  *UNUSUAL SHORTNESS OF BREATH  *UNUSUAL BRUISING OR BLEEDING  TENDERNESS IN MOUTH AND THROAT WITH OR WITHOUT PRESENCE OF ULCERS  *URINARY PROBLEMS  *BOWEL PROBLEMS  UNUSUAL RASH Items with * indicate a potential emergency and should be followed up as soon as possible.  Feel free to call the clinic should you have any questions or concerns. The clinic phone number is [phone].  Please show the CHEMO ALERT CARD at check-in to the Emergency Department and triage nurse.   

## 2019-11-09 ENCOUNTER — Inpatient Hospital Stay: Payer: Medicaid Other

## 2019-11-09 ENCOUNTER — Other Ambulatory Visit: Payer: Self-pay

## 2019-11-09 VITALS — BP 121/52 | HR 64 | Temp 97.9°F | Resp 18

## 2019-11-09 DIAGNOSIS — Z7189 Other specified counseling: Secondary | ICD-10-CM

## 2019-11-09 DIAGNOSIS — Z5112 Encounter for antineoplastic immunotherapy: Secondary | ICD-10-CM | POA: Diagnosis not present

## 2019-11-09 DIAGNOSIS — C3412 Malignant neoplasm of upper lobe, left bronchus or lung: Secondary | ICD-10-CM

## 2019-11-09 MED ORDER — ACETAMINOPHEN 325 MG PO TABS
650.0000 mg | ORAL_TABLET | Freq: Once | ORAL | Status: AC
  Administered 2019-11-09: 650 mg via ORAL

## 2019-11-09 MED ORDER — SODIUM CHLORIDE 0.9 % IV SOLN
100.0000 mg/m2 | Freq: Once | INTRAVENOUS | Status: AC
  Administered 2019-11-09: 180 mg via INTRAVENOUS
  Filled 2019-11-09: qty 9

## 2019-11-09 MED ORDER — SODIUM CHLORIDE 0.9 % IV SOLN
Freq: Once | INTRAVENOUS | Status: AC
  Filled 2019-11-09: qty 250

## 2019-11-09 MED ORDER — SODIUM CHLORIDE 0.9% FLUSH
10.0000 mL | INTRAVENOUS | Status: DC | PRN
  Administered 2019-11-09: 10 mL
  Filled 2019-11-09: qty 10

## 2019-11-09 MED ORDER — HEPARIN SOD (PORK) LOCK FLUSH 100 UNIT/ML IV SOLN
500.0000 [IU] | Freq: Once | INTRAVENOUS | Status: AC | PRN
  Administered 2019-11-09: 500 [IU]
  Filled 2019-11-09: qty 5

## 2019-11-09 MED ORDER — SODIUM CHLORIDE 0.9 % IV SOLN
10.0000 mg | Freq: Once | INTRAVENOUS | Status: AC
  Administered 2019-11-09: 10 mg via INTRAVENOUS
  Filled 2019-11-09: qty 10

## 2019-11-09 MED ORDER — ACETAMINOPHEN 325 MG PO TABS
ORAL_TABLET | ORAL | Status: AC
  Filled 2019-11-09: qty 2

## 2019-11-09 NOTE — Patient Instructions (Signed)
[facility] Discharge Instructions for Patients Receiving Chemotherapy  Today you received the following chemotherapy agents: Etoposide.  To help prevent nausea and vomiting after your treatment, we encourage you to take your nausea medication as directed.   If you develop nausea and vomiting that is not controlled by your nausea medication, call the clinic.   BELOW ARE SYMPTOMS THAT SHOULD BE REPORTED IMMEDIATELY:  *FEVER GREATER THAN 100.5 F  *CHILLS WITH OR WITHOUT FEVER  NAUSEA AND VOMITING THAT IS NOT CONTROLLED WITH YOUR NAUSEA MEDICATION  *UNUSUAL SHORTNESS OF BREATH  *UNUSUAL BRUISING OR BLEEDING  TENDERNESS IN MOUTH AND THROAT WITH OR WITHOUT PRESENCE OF ULCERS  *URINARY PROBLEMS  *BOWEL PROBLEMS  UNUSUAL RASH Items with * indicate a potential emergency and should be followed up as soon as possible.  Feel free to call the clinic should you have any questions or concerns. The clinic phone number is [phone].  Please show the [facility] at check-in to the Emergency Department and triage nurse.

## 2019-11-11 ENCOUNTER — Inpatient Hospital Stay: Payer: Medicaid Other

## 2019-11-14 ENCOUNTER — Other Ambulatory Visit: Payer: Self-pay | Admitting: Internal Medicine

## 2019-11-14 ENCOUNTER — Telehealth: Payer: Self-pay

## 2019-11-14 MED ORDER — OXYCODONE HCL 20 MG PO TABS
20.0000 mg | ORAL_TABLET | Freq: Three times a day (TID) | ORAL | 0 refills | Status: DC | PRN
Start: 2019-11-14 — End: 2019-11-14

## 2019-11-14 MED ORDER — OXYCODONE HCL 20 MG PO TABS
20.0000 mg | ORAL_TABLET | Freq: Three times a day (TID) | ORAL | 0 refills | Status: DC | PRN
Start: 2019-11-14 — End: 2019-11-15

## 2019-11-14 NOTE — Telephone Encounter (Signed)
Ms Dehnert called stating she needs a refill of pain medication.  She also states that the right side of her head and face are more painful.  She is unable to touch her right eyebrow without "excurtiating" pain.  I sent a in basket message to Dr Hilma Favors for oxycodone refill.  She has an appt with Lacie this Wednesday.  I reviewed appt date and time she verbalized understanding.

## 2019-11-14 NOTE — Progress Notes (Signed)
[facility] did not have ox[facility]codone. Script re-sent to [facility] on [address]. [facility]

## 2019-11-14 NOTE — Progress Notes (Signed)
Refill on ox[facility]codone sent to [facility] [place]. PDMP reviewed.  Lane Hacker, DO Palliative Medicine

## 2019-11-15 ENCOUNTER — Telehealth: Payer: Self-pay

## 2019-11-15 ENCOUNTER — Other Ambulatory Visit: Payer: Self-pay | Admitting: Internal Medicine

## 2019-11-15 MED ORDER — OXYCODONE HCL 20 MG PO TABS
20.0000 mg | ORAL_TABLET | Freq: Three times a day (TID) | ORAL | 0 refills | Status: DC | PRN
Start: 2019-11-15 — End: 2019-12-07

## 2019-11-15 MED FILL — OXYCODONE HCL 20 MG TABS: 20 | 30 days supply | Qty: 90 | Fill #0

## 2019-11-15 NOTE — Progress Notes (Signed)
Script for oxycodone sent to [facility]. Retail pharmacies are not stocking oxycodone.  Lane Hacker, DO Palliative Medicine

## 2019-11-15 NOTE — Telephone Encounter (Signed)
Ms Hartl left message with [employer].  She was unable to get oxycodone 20 mg tablet rx from [facility] on [address] as they did not have any in stock.  I spoke with Ms Lata this am.  She did not go to the ED for evaluation last night.  I called [facility] on [address] and verified they do not have oxycodone 20 mg tablets in stock at this time.  Ms Kokesh's prescription has not been filled.  I cancelled that prescription.  I called [facility] outpatient pharmacy and they do have oxycodone 20mg  tablets in stock.  I sent Dr Hilma Favors an in basket message with this information.

## 2019-11-16 ENCOUNTER — Encounter: Payer: Self-pay | Admitting: Hematology

## 2019-11-16 ENCOUNTER — Inpatient Hospital Stay: Payer: Medicaid Other

## 2019-11-16 ENCOUNTER — Other Ambulatory Visit: Payer: Self-pay

## 2019-11-16 ENCOUNTER — Telehealth: Payer: Self-pay | Admitting: Radiation Therapy

## 2019-11-16 ENCOUNTER — Inpatient Hospital Stay (HOSPITAL_BASED_OUTPATIENT_CLINIC_OR_DEPARTMENT_OTHER): Payer: Medicaid Other | Admitting: Hematology

## 2019-11-16 VITALS — BP 93/65 | HR 100 | Temp 98.2°F | Resp 18 | Ht 62.0 in | Wt 150.9 lb

## 2019-11-16 DIAGNOSIS — Z95828 Presence of other vascular implants and grafts: Secondary | ICD-10-CM | POA: Insufficient documentation

## 2019-11-16 DIAGNOSIS — Z5112 Encounter for antineoplastic immunotherapy: Secondary | ICD-10-CM | POA: Diagnosis not present

## 2019-11-16 DIAGNOSIS — F32A Depression, unspecified: Secondary | ICD-10-CM

## 2019-11-16 DIAGNOSIS — C3492 Malignant neoplasm of unspecified part of left bronchus or lung: Secondary | ICD-10-CM

## 2019-11-16 DIAGNOSIS — F419 Anxiety disorder, unspecified: Secondary | ICD-10-CM

## 2019-11-16 DIAGNOSIS — C3412 Malignant neoplasm of upper lobe, left bronchus or lung: Secondary | ICD-10-CM | POA: Diagnosis not present

## 2019-11-16 DIAGNOSIS — E860 Dehydration: Secondary | ICD-10-CM

## 2019-11-16 DIAGNOSIS — Z7189 Other specified counseling: Secondary | ICD-10-CM

## 2019-11-16 LAB — CMP (CANCER CENTER ONLY)
ALT: 40 U/L (ref 0–44)
AST: 23 U/L (ref 15–41)
Albumin: 3.7 g/dL (ref 3.5–5.0)
Alkaline Phosphatase: 90 U/L (ref 38–126)
Anion gap: 9 (ref 5–15)
BUN: 25 mg/dL — ABNORMAL HIGH (ref 6–20)
CO2: 27 mmol/L (ref 22–32)
Calcium: 10 mg/dL (ref 8.9–10.3)
Chloride: 106 mmol/L (ref 98–111)
Creatinine: 0.91 mg/dL (ref 0.44–1.00)
GFR, Estimated: >60 mL/min (ref >60)
Glucose, Bld: 122 mg/dL — ABNORMAL HIGH (ref 70–99)
Potassium: 3.5 mmol/L (ref 3.5–5.1)
Sodium: 142 mmol/L (ref 135–145)
Total Bilirubin: 0.3 mg/dL (ref 0.3–1.2)
Total Protein: 7.4 g/dL (ref 6.5–8.1)

## 2019-11-16 LAB — CBC WITH DIFFERENTIAL (CANCER CENTER ONLY)
Abs Immature Granulocytes: 0.04 10*3/uL (ref 0.00–0.07)
Basophils Absolute: 0.1 10*3/uL (ref 0.0–0.1)
Basophils Relative: 2 %
Eosinophils Absolute: 0.1 10*3/uL (ref 0.0–0.5)
Eosinophils Relative: 2 %
HCT: 34.9 % — ABNORMAL LOW (ref 36.0–46.0)
Hemoglobin: 10.9 g/dL — ABNORMAL LOW (ref 12.0–15.0)
Immature Granulocytes: 1 %
Lymphocytes Relative: 12 %
Lymphs Abs: 0.5 10*3/uL — ABNORMAL LOW (ref 0.7–4.0)
MCH: 27.5 pg (ref 26.0–34.0)
MCHC: 31.2 g/dL (ref 30.0–36.0)
MCV: 87.9 fL (ref 80.0–100.0)
Monocytes Absolute: 0.1 10*3/uL (ref 0.1–1.0)
Monocytes Relative: 3 %
Neutro Abs: 3.2 10*3/uL (ref 1.7–7.7)
Neutrophils Relative %: 80 %
Platelet Count: 240 10*3/uL (ref 150–400)
RBC: 3.97 MIL/uL (ref 3.87–5.11)
RDW: 14.6 % (ref 11.5–15.5)
WBC Count: 4 10*3/uL (ref 4.0–10.5)
nRBC: 0 % (ref 0.0–0.2)

## 2019-11-16 MED ORDER — DIAZEPAM 10 MG PO TABS
20.0000 mg | ORAL_TABLET | ORAL | 0 refills | Status: DC
Start: 2019-11-16 — End: 2020-01-01

## 2019-11-16 MED ORDER — SODIUM CHLORIDE 0.9% FLUSH
10.0000 mL | Freq: Once | INTRAVENOUS | Status: AC
  Administered 2019-11-16: 10 mL
  Filled 2019-11-16: qty 10

## 2019-11-16 MED ORDER — HEPARIN SOD (PORK) LOCK FLUSH 100 UNIT/ML IV SOLN
500.0000 [IU] | Freq: Once | INTRAVENOUS | Status: AC
  Administered 2019-11-16: 500 [IU]
  Filled 2019-11-16: qty 5

## 2019-11-16 MED ORDER — DRONABINOL 5 MG PO CAPS
5.0000 mg | ORAL_CAPSULE | Freq: Two times a day (BID) | ORAL | 0 refills | Status: DC
Start: 2019-11-16 — End: 2020-01-01

## 2019-11-16 MED ORDER — LORAZEPAM 1 MG PO TABS: 1.0000 mg | ORAL_TABLET | Freq: Two times a day (BID) | ORAL | 0 refills | Status: AC | PRN

## 2019-11-16 MED ORDER — LIDOCAINE 5 % EX PTCH: 1.0000 | MEDICATED_PATCH | CUTANEOUS | 1 refills | Status: AC

## 2019-11-16 MED ORDER — PEGFILGRASTIM-JMDB 6 MG/0.6ML ~~LOC~~ SOSY
PREFILLED_SYRINGE | SUBCUTANEOUS | Status: AC
  Filled 2019-11-16: qty 0.6

## 2019-11-16 MED ORDER — PEGFILGRASTIM-JMDB 6 MG/0.6ML ~~LOC~~ SOSY: 6.0000 mg | PREFILLED_SYRINGE | Freq: Once | SUBCUTANEOUS | Status: DC

## 2019-11-16 MED ORDER — SODIUM CHLORIDE 0.9 % IV SOLN
INTRAVENOUS | Status: AC
  Filled 2019-11-16 (×2): qty 250

## 2019-11-16 NOTE — Progress Notes (Signed)
[facility]   [phone] [phone]   Clinic Follow up Note   Patient Care Team: [name], MD as PCP - General (Hematology)  Date of Service:  11/16/2019  CHIEF COMPLAINT: F/u of Small Cell Lung Cancer  SUMMARY OF ONCOLOGIC HISTORY: Oncology History Overview Note  Cancer Staging Small cell lung cancer, left upper lobe [facility]) Staging form: Lung, AJCC 8th Edition - Clinical stage from 08/24/2018: Stage IIIA (cT1a, cN2, cM0) - Signed by [name], MD on 10/17/2018    Small cell lung cancer, left upper lobe ([place])  07/29/2018 Imaging   CT Angio Chest 07/29/18  IMPRESSION: 1. Malignant appearing left mediastinal and hilar lymphadenopathy narrowing the airways at the left hilum. In the left lung only a subtle 9 mm spiculated nodule is identified in the lingula. Consider small cell carcinoma. Bronchoscopy or mediastinal node sampling might be most valuable for diagnosis. 2. Indeterminate although low-density (which typically which indicates a benign adenoma) left adrenal nodule. 3.  Negative for acute pulmonary embolus.   08/24/2018 Definitive Surgery   Bronchoscopy and EBUS biopsy on 08/24/18 by Dr. Loanne Drilling   08/24/2018 Initial Biopsy   Diagnosis 08/24/18 FINE NEEDLE ASPIRATION, ENDOSCOPIC, (EBUS) STATION # 7 LYMPH NODE(SPECIMEN 1 OF 4 COLLECTED 08/24/18): NO MALIGNANT CELLS IDENTIFIED.  FINE NEEDLE ASPIRATION, ENDOSCOPIC, (EBUS) LEFT HILAR LYMPH NODE(SPECIMEN 2 OF 4 COLLECTED 08/24/18): NO MALIGNANT CELLS IDENTIFIED.  TRANSBRONCHIAL NEEDLE ASPIRATION, WANG, (SPECIMEN 3 OF 4 COLLECTED 08/24/18): NO MALIGNANT CELLS IDENTIFIED.  BRONCHIAL BRUSHING, LUL (SPECIMEN 4 OF 4 COLLECTED 08/24/18): MALIGNANT CELLS PRESENT, MOST CONSISTENT WITH SMALL CELL CARCINOMA. SEE COMMENT.   08/24/2018 Cancer Staging   Staging form: Lung, AJCC 8th Edition - Clinical stage from 08/24/2018: Stage IIIA (cT1a, cN2, cM0) - Signed by [name], MD on 10/17/2018   08/26/2018  Initial Diagnosis   Small cell lung cancer, left ([place])   08/26/2018 Imaging   CT AP W Contrast 08/26/18  IMPRESSION: 1. 2.9 cm left adrenal nodule cannot be definitively characterized on this study. Potentially a lipid poor adenoma, but metastatic disease not excluded. MRI of the abdomen with and without contrast may prove helpful to further evaluate. 2. No other findings to suggest metastatic disease in the abdomen/pelvis. 3. Left base atelectasis with small left pleural effusion.   08/27/2018 Imaging   MRI brain 08/27/18  IMPRESSION: 1. Motion degraded, incomplete examination. 2. Subcentimeter focus of diffusion signal abnormality in the left cerebellum. No enhancement is evident to strongly suggest a metastasis, and this may reflect an acute to subacute small vessel infarct. 3. No evidence of intracranial metastatic disease elsewhere. 4. Given motion artifact and incomplete postcontrast imaging, consider short-term follow-up MRI (possibly with sedation) to further evaluate the left cerebellar abnormality and provide a more thorough evaluation for metastatic disease.   09/02/2018 PET scan   IMPRESSION: 1. Hypermetabolic left hilar mass is identified encasing the left mainstem bronchus resulting in postobstructive atelectasis in the left upper lobe. 2. Hypermetabolic left mediastinal nodal metastasis. 3. Loculated left pleural effusion is identified with mild increased FDG uptake. Cannot rule out malignant pleural effusion. 4. No evidence for metastatic disease to the abdomen or pelvis or skeletal structures.     09/07/2018 - 11/10/2018 Chemotherapy   Concurrent chemoRT with IV cisplatin on day 1 and etopside day 1-3 for every 3 weeks starting 8/11. Will start radiation with Dr. Lisbeth Renshaw with cycle 2. She completed 4 cycles on 11/10/18   09/14/2018 Procedure   Thoracentesis yielding 750 mL   09/28/2018 -  12/09/2019 Radiation Therapy   Concurrent chemoRT with Dr. Lisbeth Renshaw. Start with  cycle 2 chemo on 09/28/18 and completed on 12/09/18.    02/10/2019 Imaging   CT CAP W Contrast  IMPRESSION: 1. Interval response to therapy. Significant interval reduction in tumor burden involving the left hemithorax. 2. No new or progressive disease identified within the chest, abdomen or pelvis. 3. Emphysema and aortic atherosclerosis. 4. Stable left adrenal nodule.   Aortic Atherosclerosis (ICD10-I70.0) and Emphysema (ICD10-J43.9).   02/11/2019 Imaging   MRI Brain  IMPRESSION: 6 x 10 mm solitary metastatic deposit right frontal lobe with minimal edema.   02/28/2019 - 03/14/2019 Radiation Therapy   Target Brain Radiation with Dr. Lisbeth Renshaw    05/19/2019 Imaging   CT CAP w contrast  IMPRESSION: 1. Trace residual soft tissue thickening at the left lung apex and along the AP window. Slight increase in size of a low left paratracheal lymph node. Continued attention on follow-up exams is warranted. No evidence of distant metastatic disease. 2. Subpleural density in the posterior left upper lobe is new and may be due to atelectasis. Recommend attention on follow-up. 3. Left adrenal adenoma. 4. Aortic atherosclerosis (ICD10-I70.0). Coronary artery calcification. 5.  Emphysema (ICD10-J43.9).   07/13/2019 Imaging   MRI brain  IMPRESSION: 1. Increased size of right frontal lobe metastasis with new moderate edema. 2. No evidence of new intracranial metastases.     08/10/2019 Imaging   CT CAP W Contrast  IMPRESSION: 1. New and progressive adenopathy within the mediastinum and low left neck, consistent with nodal metastasis. 2. No evidence of pulmonary metastasis. Similar areas of probable scarring and pleural thickening. 3. No subdiaphragmatic metastatic disease identified. 4. Left adrenal adenoma. 5. Age advanced aortic atherosclerosis (ICD10-I70.0) and emphysema (ICD10-J43.9).     08/19/2019 Imaging   Ct Angio Chest  IMPRESSION: 1. No evidence of pulmonary embolus. 2. Stable  nodal metastasis within the mediastinum and left internal jugular chain. 3. Interval development of mild interlobular septal thickening and ground-glass opacity, greatest in the right upper lobe and left lower lobe. Differential includes asymmetric edema versus atypical infection. 4. Aortic Atherosclerosis (ICD10-I70.0) and Emphysema (ICD10-J43.9).     08/25/2019 Imaging   MRI Brain  IMPRESSION: Increased size of centrally necrotic enhancing right frontal lesion measuring 4.0 cm, previously 2.6 cm.   White matter edema and dural thickening/enhancement involving the right frontal convexity is more conspicuous than prior exam.   No new enhancing lesion.   09/01/2019 Procedure   PAC placement    09/19/2019 Imaging   MRI brain  IMPRESSION: 1. Postoperative changes from interval right frontal craniotomy for resection of previously identified right frontal lobe mass. Somewhat ill-defined parenchymal enhancement about the resection cavity favored to reflect postoperative changes. No definite residual tumor seen on this immediate postoperative scan. 2. Small 1.2 cm infarct at the deep margin of the resection cavity. 3. Interval increase in T2/FLAIR signal intensity involving the adjacent right frontal region with increased regional mass effect and up to 6 mm of right-to-left shift.   09/19/2019 Surgery   Right CRANIOTOMY TUMOR EXCISION, FRONTAL and APPLICATION OF CRANIAL NAVIGATION by Dr Kathyrn Sheriff   09/19/2019 Pathology Results   FINAL MICROSCOPIC DIAGNOSIS:   A. BRAIN TUMOR, RIGHT FRONTAL, EXCISION:  - Small cell carcinoma.   B. BRAIN TUMOR, RIGHT FRONTAL DURA, EXCISION:  - Small cell carcinoma.    COMMENT:   CK8/18 (dot-like), Synaptophysin and Chromogranin are positive.  CKAE1AE3, CK7 and Chromogranin are noncontributory.  Ki-67 proliferation  index reaches 70-80%.  The morphology and immunophenotype are compatible  with the provided clinical history of small cell  carcinoma.  Dr. Melina Copa  reviewed.    10/27/2019 Imaging   IMPRESSION: 1. Significant interval enlargement of bulky mediastinal and left hilar lymphadenopathy. There are new pleural nodules or prevascular lymph nodes. 2. Significant interval increase in masslike consolidation about the left hilum. There is interlobular septal thickening and scattered ground-glass and heterogeneous airspace opacity throughout the left lung, predominantly involving the left upper lobe and concerning for lymphangitic spread and postobstructive airspace disease. 3. There is somewhat nodular pleural thickening about the medial left upper lobe and a new small left pleural effusion. 4. Findings are consistent with worsened lung malignancy with pleural and nodal metastatic disease. 5. No significant interval change in a left adrenal mass, likely an incidental adenoma. Attention on follow-up. 6. No definite evidence of distant metastatic disease within the abdomen or pelvis. 7. Emphysema (ICD10-J43.9). 8. Aortic Atherosclerosis (ICD10-I70.0)   11/07/2019 -  Chemotherapy   First-line chemo Carboplatin day 1, Etopside day 1-3 with immunotherapy Tecentriq day 1 q3weeks starting 11/07/19      CURRENT THERAPY:  First-line chemo Carboplatin, Etopside day 1-3 with immunotherapy Tecentriq q3weeks starting 11/07/19  INTERVAL HISTORY:  Marissa [name] is here for a follow up. She presents to the clinic alone. She notes her headaches has progressively worsened. She did not have transportation to go come to clinic before today. She notes when she touches her middle to right facial tenderness around her scar with sharp pain up to 15/10. She takes oxycodone 43m which helps reduce her pain. She has no more Ativan. She notes she needs more lidocaine patches for her right knee pain. She notes her knee gives out . She did have right knee injury in the past. She notes she uses cold rag over her right eye and focusing her vision  takes time. Her recent headaches and her vision change has been present since her brain surgery. She notes she has not been seen by her neurosurgeon and Dr VMickeal Skinnerrecently. She feels her pain is worse than before her surgery. She notes her jaw ROM is reduced since surgery and effects her eating.  She notes she had fatigue and nausea after her first cycle chemo.     REVIEW OF SYSTEMS:   Constitutional: Denies fevers, chills or abnormal weight loss (+) headaches  Eyes: (+) Decreased right eye vision Ears, nose, mouth, throat, and face: Denies mucositis or sore throat (+) Decreased jaw ROM Respiratory: Denies cough, dyspnea or wheezes Cardiovascular: Denies palpitation, chest discomfort or lower extremity swelling Gastrointestinal:  Denies nausea, heartburn or change in bowel habits Skin: Denies abnormal skin rashes Lymphatics: Denies new lymphadenopathy or easy bruising Neurological:Denies numbness, tingling or new weaknesses Behavioral/Psych: Mood is stable, no new changes  All other systems were reviewed with the patient and are negative.  MEDICAL HISTORY:  Past Medical History:  Diagnosis Date  . Asthma   . Chronic headaches   . COPD (chronic obstructive pulmonary disease) ([place]   . Depression   . Dyspnea    hospitalized July 2020  . Heart murmur   . [place] cancer [place]     SURGICAL HISTORY: Past Surgical History:  Procedure Laterality Date  . APPLICATION OF CRANIAL NAVIGATION Right 09/19/2019   Procedure: APPLICATION OF CRANIAL NAVIGATION;  Surgeon: NConsuella Lose MD;  [facility]  Service: Neurosurgery;  Laterality: Right;  APPLICATION OF CRANIAL NAVIGATION  . BRONCHIAL BIOPSY  08/24/2018   Procedure:  BRONCHIAL BIOPSIES;  Surgeon: Margaretha Seeds, MD;  Location: [facility];  Service: Cardiopulmonary;;  . BRONCHIAL BRUSHINGS  08/24/2018   Procedure: BRONCHIAL BRUSHINGS;  Surgeon: Margaretha Seeds, MD;  Location: [facility];  Service: Cardiopulmonary;;    . BRONCHIAL NEEDLE ASPIRATION BIOPSY  08/24/2018   Procedure: BRONCHIAL NEEDLE ASPIRATION BIOPSIES;  Surgeon: Margaretha Seeds, MD;  Location: [facility];  Service: Cardiopulmonary;;  . CRANIOTOMY Right 09/19/2019   Procedure: CRANIOTOMY TUMOR EXCISION, FRONTAL;  Surgeon: Consuella Lose, MD;  Location: [place];  Service: Neurosurgery;  Laterality: Right;  CRANIOTOMY TUMOR EXCISION, FRONTAL  . ENDOBRONCHIAL ULTRASOUND N/A 08/24/2018   Procedure: ENDOBRONCHIAL ULTRASOUND;  Surgeon: Margaretha Seeds, MD;  Location: [facility];  Service: Cardiopulmonary;  Laterality: N/A;  . IR IMAGING GUIDED PORT INSERTION  09/01/2019  . IR THORACENTESIS ASP PLEURAL SPACE W/IMG GUIDE  09/14/2018  . TUBAL LIGATION    . VIDEO BRONCHOSCOPY  08/24/2018   Procedure: VIDEO BRONCHOSCOPY;  Surgeon: Margaretha Seeds, MD;  Location: [facility];  Service: Cardiopulmonary;;    I have reviewed the social history and family history with the patient and they are unchanged from previous note.  ALLERGIES:  is allergic to penicillins.  MEDICATIONS:  Current Outpatient Medications  Medication Sig Dispense Refill  . acetaminophen (TYLENOL) 325 MG tablet Take 1,300 mg by mouth every 6 (six) hours as needed for mild pain or moderate pain.    . baclofen (LIORESAL) 10 MG tablet Take 1 tablet (10 mg total) by mouth 3 (three) times daily as needed for muscle spasms. 30 each 0  . diazepam (VALIUM) 10 MG tablet Take 2 tablets (20 mg total) by mouth See admin instructions. Take 1-2 tablets by mouth 30 minutes prior to MRI or radiation 3 tablet 0  . dronabinol (MARINOL) 5 MG capsule Take 1 capsule (5 mg total) by mouth 2 (two) times daily before a meal. 60 capsule 0  . DULoxetine (CYMBALTA) 20 MG capsule Take 2 capsules (40 mg total) by mouth daily. Take 1 capsule by mouth daily for the first week, then increase to 2 caps daily (Patient taking differently: Take 40 mg by mouth daily. ) 60 capsule 1  . gabapentin (NEURONTIN) 300 MG  capsule Take 1 tab in morning and 2 tabs at bedtime 90 capsule 2  . lidocaine (LIDODERM) 5 % Place 1 patch onto the skin daily. 20 patch 1  . lidocaine-prilocaine (EMLA) cream Apply to affected area once 30 g 3  . LORazepam (ATIVAN) 1 MG tablet Take 1 tablet (1 mg total) by mouth every 12 (twelve) hours as needed for anxiety. 30 tablet 0  . ondansetron (ZOFRAN) 8 MG tablet Take 1 tablet (8 mg total) by mouth 2 (two) times daily as needed for refractory nausea / vomiting. Start on day 3 after carboplatin chemo. 30 tablet 1  . Oxycodone HCl 20 MG TABS Take 1 tablet (20 mg total) by mouth 3 (three) times daily as needed. 90 tablet 0  . prochlorperazine (COMPAZINE) 10 MG tablet Take 1 tablet (10 mg total) by mouth every 6 (six) hours as needed (Nausea or vomiting). 30 tablet 1   No current facility-administered medications for this visit.    PHYSICAL EXAMINATION: ECOG PERFORMANCE STATUS: 2 - Symptomatic, <50% confined to bed  Vitals:   11/16/19 1122  BP: 93/65  Pulse: 100  Resp: 18  Temp: 98.2 F (36.8 C)  SpO2: 99%   Filed Weights   11/16/19 1122  Weight: 150 lb 14.4 oz (68.4  kg)    GENERAL:alert, no distress and comfortable SKIN: skin color, texture, turgor are normal, no rashes or significant lesions (+) Right frontal scalp incision healed well.  EYES: normal, Conjunctiva are pink and non-injected, sclera clear  NECK: supple, thyroid normal size, non-tender, without nodularity LYMPH:  no palpable lymphadenopathy in the cervical, axillary  LUNGS: clear to auscultation and percussion with normal breathing effort HEART: regular rate & rhythm and no murmurs and no lower extremity edema ABDOMEN:abdomen soft, non-tender and normal bowel sounds Musculoskeletal:no cyanosis of digits and no clubbing  NEURO: alert & oriented x 3 with fluent speech, no focal motor/sensory deficits  LABORATORY DATA:  I have reviewed the data as listed CBC Latest Ref Rng & Units 11/16/2019 11/07/2019  09/19/2019  WBC 4.0 - 10.5 K/uL 4.0 9.2 7.0  Hemoglobin 12.0 - 15.0 g/dL 10.9(L) 11.7(L) 13.2  Hematocrit 36 - 46 % 34.9(L) 38.6 42.0  Platelets 150 - 400 K/uL 240 446(H) 305     CMP Latest Ref Rng & Units 11/16/2019 11/07/2019 09/19/2019  Glucose 70 - 99 mg/dL 122(H) 91 118(H)  BUN 6 - 20 mg/dL 25(H) 16 31(H)  Creatinine 0.44 - 1.00 mg/dL 0.91 0.77 0.72  Sodium 135 - 145 mmol/L 142 141 143  Potassium 3.5 - 5.1 mmol/L 3.5 4.0 3.4(L)  Chloride 98 - 111 mmol/L 106 108 108  CO2 22 - 32 mmol/L _0 Calcium 8.9 - 10.3 mg/dL 10.0 9.5 10.0  Total Protein 6.5 - 8.1 g/dL 7.4 7.2 -  Total Bilirubin 0.3 - 1.2 mg/dL 0.3 <0.2(L) -  Alkaline Phos 38 - 126 U/L 90 87 -  AST 15 - 41 U/L 23 16 -  ALT 0 - 44 U/L 40 13 -      RADIOGRAPHIC STUDIES: I have personally reviewed the radiological images as listed and agreed with the findings in the report. No results found.   ASSESSMENT & PLAN:  Marissa [name] is a 54 y.o. female with    1.Small cell lung cancer, limited stagein 07/2018,brain metastasis in 01/2019, mediastinal node recurrence in 07/2019 -She was diagnosed in 07/2018. Given her locally advanced disease (stage III cancer), she underwent standard treatment with concurrent chemoRT with 4 cycles of cisplatin/etopside.  -Unfortunately her Brain MRI from 02/11/19 showed 6x10 mm solitary brain metastasis in right frontal lobe. She completed radiation to brain 02/28/19-03/14/19 with Dr. Lisbeth Renshaw. -Her cancer progressed on CT CAP from 08/10/19 which showed new and progressive adenopathy within the mediastinum and low left neck consistent with nodal metastasis. No evidence of pulmonary or other distant metastasis. -Given symptomatic brain metastasis progression. She underwent right craniotomy tumor resection on 09/19/19.  -Given recurrent disease, I started her on first-line IV Carboplatin on day 1, Etopside day 1-3 with immunotherapy Tecentriq on day 1 q3weeks beginning 11/07/19. Goal is to control her  disease and prolong her life.  -She tolerated her first cycle chemo with nausea and fatigue. She was able to recover. Labs reviewed, CBC and CMP WNL except Hg 10.9. Will proceed with IV Fluids today.  -F/u with C2 on 11/1  2. Head pain, Decreased right eye vision, Decreased jaw ROM -She underwent right craniotomy tumor excision with Dr Kathyrn Sheriff on 09/19/19. She will continue to f/u with Dr. Mickeal Skinner. -Since her surgery she notes increasing head pain triggered by her right scalp incision. Her incision has healed very well. Her pain can get up to 15/10 which is worse than before her surgery.  -She notes right eye vision has decreased  and takes time for her vision to focus. Also notes decreased right jaw ROM which effects her eating.  -She has not followed up with Dr Mickeal Skinner or her neurosurgeon recently. I recommend MRI brain, she is agreeable.   3. Smoking Cessation, COPD,H/oacute left tension pneumothorax in 07/2018 after bronchoscopy and EBUS biopsy (resolved) -She has a h/o heavy smoking. She has COPD. Will continue inhaler and nebulizer.  -She has reduced her smoking to 10-11 cigarettes a day. I highly advised her to quit completely. -She does have disease progression in lung on 10/27/19 CT scan. Will proceed with chemo to better control disease.   4. Severe LUQ and left flank pain, right knee pain  -She is now being seen by Dr Nancy Nordmann for pain management.She remains on Oxycodone 46m q6hours and Cymbalta and Gabapentin. and Lidocaine patch for her right knee pain.  -Her overall pain is controlled except her recently worsened headaches.   5. Iron deficient anemia, S/p 2 IV Iron doses in 07/2018. -Currently mild on first-line chemo. Will monitor.   6. Anxiety/Depression, irritability, Social Support  -On Ativan and Valium as needed.  -She notes she uses Marijuana to help her eat. She is onlow dose oxycodonenow. She denies any other use of recreational drugs.  -She will continue SW  involvement. She has not seen MDominoprovider yet.  -Currently on Medicaid, disability, food stamps. She gets $529 per month. Still does not have a car and gets transportation assistance from [facility].  -She plans to move to better housing (3 bedroom house) soon.  -Mood seems more stable on her medication.   7. Goals of care -She knows her new brain metastasis makes her cancer stage IV and carries a poor prognosis.  -goal of her treatment is palliative  -she is full code now    PLAN: -I refilled valium, Marinol, lidocaine and ativan today  -MRI brain w wo contrast ASAP, will call her after MRI  -IV Fluids today  -Lab, flush, F/u with me or NP Lacie and chemo Carboplatin, Etopside Tecentriq on 11/1 with Etopside on Day 1-3.   No problem-specific Assessment & Plan notes found for this encounter.   Orders Placed This Encounter  Procedures  . MR Brain W Wo Contrast    Standing Status:   Future    Standing Expiration Date:   11/15/2020    Order Specific Question:   If indicated for the ordered procedure, I authorize the administration of contrast media per Radiology protocol    Answer:   Yes    Order Specific Question:   What is the patient's sedation requirement?    Answer:   No Sedation    Order Specific Question:   Does the patient have a pacemaker or implanted devices?    Answer:   Yes    Order Specific Question:   Use SRS Protocol?    Answer:   No    Order Specific Question:   Preferred imaging [facility]?    Answer[facility]   [facility](table limit - 550 lbs)   All questions were answered. The patient knows to call the clinic with any problems, questions or concerns. No barriers to learning was detected. The total time spent in the appointment was 30 minutes.     YTruitt Merle MD 11/16/2019   I, AJoslyn Devon am acting as scribe for YTruitt Merle MD.   I have reviewed the above documentation for accuracy and completeness, and I agree with the above.

## 2019-11-16 NOTE — Patient Instructions (Signed)

## 2019-11-16 NOTE — Progress Notes (Deleted)
Marissa [name]   [phone] [phone]   Clinic Follow up Note   Patient Care Team: [name], MD as PCP - General (Hematology) 11/16/2019  CHIEF COMPLAINT: f/u metastatic SCLC, nadir check   SUMMARY OF ONCOLOGIC HISTORY: Oncology History Overview Note  Cancer Staging Small cell lung cancer, left upper lobe ([place]) Staging form: Lung, AJCC 8th Edition - Clinical stage from 08/24/2018: Stage IIIA (cT1a, cN2, cM0) - Signed by [name], MD on 10/17/2018    Small cell lung cancer, left upper lobe ([facility])  07/29/2018 Imaging   CT Angio Chest 07/29/18  IMPRESSION: 1. Malignant appearing left mediastinal and hilar lymphadenopathy narrowing the airways at the left hilum. In the left lung only a subtle 9 mm spiculated nodule is identified in the lingula. Consider small cell carcinoma. Bronchoscopy or mediastinal node sampling might be most valuable for diagnosis. 2. Indeterminate although low-density (which typically which indicates a benign adenoma) left adrenal nodule. 3.  Negative for acute pulmonary embolus.   08/24/2018 Definitive Surgery   Bronchoscopy and EBUS biopsy on 08/24/18 by Dr. Loanne Drilling   08/24/2018 Initial Biopsy   Diagnosis 08/24/18 FINE NEEDLE ASPIRATION, ENDOSCOPIC, (EBUS) STATION # 7 LYMPH NODE(SPECIMEN 1 OF 4 COLLECTED 08/24/18): NO MALIGNANT CELLS IDENTIFIED.  FINE NEEDLE ASPIRATION, ENDOSCOPIC, (EBUS) LEFT HILAR LYMPH NODE(SPECIMEN 2 OF 4 COLLECTED 08/24/18): NO MALIGNANT CELLS IDENTIFIED.  TRANSBRONCHIAL NEEDLE ASPIRATION, WANG, (SPECIMEN 3 OF 4 COLLECTED 08/24/18): NO MALIGNANT CELLS IDENTIFIED.  BRONCHIAL BRUSHING, LUL (SPECIMEN 4 OF 4 COLLECTED 08/24/18): MALIGNANT CELLS PRESENT, MOST CONSISTENT WITH SMALL CELL CARCINOMA. SEE COMMENT.   08/24/2018 Cancer Staging   Staging form: Lung, AJCC 8th Edition - Clinical stage from 08/24/2018: Stage IIIA (cT1a, cN2, cM0) - Signed by [name], MD on 10/17/2018   08/26/2018 Initial Diagnosis    Small cell lung cancer, left ([place])   08/26/2018 Imaging   CT AP W Contrast 08/26/18  IMPRESSION: 1. 2.9 cm left adrenal nodule cannot be definitively characterized on this study. Potentially a lipid poor adenoma, but metastatic disease not excluded. MRI of the abdomen with and without contrast may prove helpful to further evaluate. 2. No other findings to suggest metastatic disease in the abdomen/pelvis. 3. Left base atelectasis with small left pleural effusion.   08/27/2018 Imaging   MRI brain 08/27/18  IMPRESSION: 1. Motion degraded, incomplete examination. 2. Subcentimeter focus of diffusion signal abnormality in the left cerebellum. No enhancement is evident to strongly suggest a metastasis, and this may reflect an acute to subacute small vessel infarct. 3. No evidence of intracranial metastatic disease elsewhere. 4. Given motion artifact and incomplete postcontrast imaging, consider short-term follow-up MRI (possibly with sedation) to further evaluate the left cerebellar abnormality and provide a more thorough evaluation for metastatic disease.   09/02/2018 PET scan   IMPRESSION: 1. Hypermetabolic left hilar mass is identified encasing the left mainstem bronchus resulting in postobstructive atelectasis in the left upper lobe. 2. Hypermetabolic left mediastinal nodal metastasis. 3. Loculated left pleural effusion is identified with mild increased FDG uptake. Cannot rule out malignant pleural effusion. 4. No evidence for metastatic disease to the abdomen or pelvis or skeletal structures.     09/07/2018 - 11/10/2018 Chemotherapy   Concurrent chemoRT with IV cisplatin on day 1 and etopside day 1-3 for every 3 weeks starting 8/11. Will start radiation with Dr. Lisbeth Renshaw with cycle 2. She completed 4 cycles on 11/10/18   09/14/2018 Procedure   Thoracentesis yielding 750 mL   09/28/2018 - 12/09/2019 Radiation Therapy  Concurrent chemoRT with Dr. Lisbeth Renshaw. Start with cycle 2 chemo on  09/28/18 and completed on 12/09/18.    02/10/2019 Imaging   CT CAP W Contrast  IMPRESSION: 1. Interval response to therapy. Significant interval reduction in tumor burden involving the left hemithorax. 2. No new or progressive disease identified within the chest, abdomen or pelvis. 3. Emphysema and aortic atherosclerosis. 4. Stable left adrenal nodule.   Aortic Atherosclerosis (ICD10-I70.0) and Emphysema (ICD10-J43.9).   02/11/2019 Imaging   MRI Brain  IMPRESSION: 6 x 10 mm solitary metastatic deposit right frontal lobe with minimal edema.   02/28/2019 - 03/14/2019 Radiation Therapy   Target Brain Radiation with Dr. Lisbeth Renshaw    05/19/2019 Imaging   CT CAP w contrast  IMPRESSION: 1. Trace residual soft tissue thickening at the left lung apex and along the AP window. Slight increase in size of a low left paratracheal lymph node. Continued attention on follow-up exams is warranted. No evidence of distant metastatic disease. 2. Subpleural density in the posterior left upper lobe is new and may be due to atelectasis. Recommend attention on follow-up. 3. Left adrenal adenoma. 4. Aortic atherosclerosis (ICD10-I70.0). Coronary artery calcification. 5.  Emphysema (ICD10-J43.9).   07/13/2019 Imaging   MRI brain  IMPRESSION: 1. Increased size of right frontal lobe metastasis with new moderate edema. 2. No evidence of new intracranial metastases.     08/10/2019 Imaging   CT CAP W Contrast  IMPRESSION: 1. New and progressive adenopathy within the mediastinum and low left neck, consistent with nodal metastasis. 2. No evidence of pulmonary metastasis. Similar areas of probable scarring and pleural thickening. 3. No subdiaphragmatic metastatic disease identified. 4. Left adrenal adenoma. 5. Age advanced aortic atherosclerosis (ICD10-I70.0) and emphysema (ICD10-J43.9).     08/19/2019 Imaging   Ct Angio Chest  IMPRESSION: 1. No evidence of pulmonary embolus. 2. Stable nodal metastasis  within the mediastinum and left internal jugular chain. 3. Interval development of mild interlobular septal thickening and ground-glass opacity, greatest in the right upper lobe and left lower lobe. Differential includes asymmetric edema versus atypical infection. 4. Aortic Atherosclerosis (ICD10-I70.0) and Emphysema (ICD10-J43.9).     08/25/2019 Imaging   MRI Brain  IMPRESSION: Increased size of centrally necrotic enhancing right frontal lesion measuring 4.0 cm, previously 2.6 cm.   White matter edema and dural thickening/enhancement involving the right frontal convexity is more conspicuous than prior exam.   No new enhancing lesion.   09/01/2019 Procedure   PAC placement    09/19/2019 Imaging   MRI brain  IMPRESSION: 1. Postoperative changes from interval right frontal craniotomy for resection of previously identified right frontal lobe mass. Somewhat ill-defined parenchymal enhancement about the resection cavity favored to reflect postoperative changes. No definite residual tumor seen on this immediate postoperative scan. 2. Small 1.2 cm infarct at the deep margin of the resection cavity. 3. Interval increase in T2/FLAIR signal intensity involving the adjacent right frontal region with increased regional mass effect and up to 6 mm of right-to-left shift.   09/19/2019 Surgery   Right CRANIOTOMY TUMOR EXCISION, FRONTAL and APPLICATION OF CRANIAL NAVIGATION by Dr Kathyrn Sheriff   09/19/2019 Pathology Results   FINAL MICROSCOPIC DIAGNOSIS:   A. BRAIN TUMOR, RIGHT FRONTAL, EXCISION:  - Small cell carcinoma.   B. BRAIN TUMOR, RIGHT FRONTAL DURA, EXCISION:  - Small cell carcinoma.    COMMENT:   CK8/18 (dot-like), Synaptophysin and Chromogranin are positive.  CKAE1AE3, CK7 and Chromogranin are noncontributory.  Ki-67 proliferation  index reaches 70-80%.  The morphology and immunophenotype  are compatible  with the provided clinical history of small cell carcinoma.  Dr. Melina Copa    reviewed.    10/24/2019 -  Chemotherapy   First-line chemo Carboplatin day 1, Etopside day 1-3 with immunotherapy Tecentriq day 1 q3weeks starting 10/24/19   10/27/2019 Imaging   IMPRESSION: 1. Significant interval enlargement of bulky mediastinal and left hilar lymphadenopathy. There are new pleural nodules or prevascular lymph nodes. 2. Significant interval increase in masslike consolidation about the left hilum. There is interlobular septal thickening and scattered ground-glass and heterogeneous airspace opacity throughout the left lung, predominantly involving the left upper lobe and concerning for lymphangitic spread and postobstructive airspace disease. 3. There is somewhat nodular pleural thickening about the medial left upper lobe and a new small left pleural effusion. 4. Findings are consistent with worsened lung malignancy with pleural and nodal metastatic disease. 5. No significant interval change in a left adrenal mass, likely an incidental adenoma. Attention on follow-up. 6. No definite evidence of distant metastatic disease within the abdomen or pelvis. 7. Emphysema (ICD10-J43.9). 8. Aortic Atherosclerosis (ICD10-I70.0)     CURRENT THERAPY:   INTERVAL HISTORY: Marissa [name] returns for nadir/toxicity check as scheduled. She received C1 carbo/etoposide and tecentriq on 10/11 and etoposide through 10/13. She did not show for her GCSF injection on 10/15.    REVIEW OF SYSTEMS:   Constitutional: Denies fevers, chills or abnormal weight loss Eyes: Denies blurriness of vision Ears, nose, mouth, throat, and face: Denies mucositis or sore throat Respiratory: Denies cough, dyspnea or wheezes Cardiovascular: Denies palpitation, chest discomfort or lower extremity swelling Gastrointestinal:  Denies nausea, heartburn or change in bowel habits Skin: Denies abnormal skin rashes Lymphatics: Denies new lymphadenopathy or easy bruising Neurological:Denies numbness, tingling or new  weaknesses Behavioral/Psych: Mood is stable, no new changes  All other systems were reviewed with the patient and are negative.  MEDICAL HISTORY:  Past Medical History:  Diagnosis Date  . Asthma   . Chronic headaches   . COPD (chronic obstructive pulmonary disease) ([place])   . Depression   . Dyspnea    hospitalized July 2020  . Heart murmur   . Small cell lung cancer ([place])     SURGICAL HISTORY: Past Surgical History:  Procedure Laterality Date  . APPLICATION OF CRANIAL NAVIGATION Right 09/19/2019   Procedure: APPLICATION OF CRANIAL NAVIGATION;  Surgeon: Consuella Lose, MD;  Location: [facility];  Service: Neurosurgery;  Laterality: Right;  APPLICATION OF CRANIAL NAVIGATION  . BRONCHIAL BIOPSY  08/24/2018   Procedure: BRONCHIAL BIOPSIES;  Surgeon: Margaretha Seeds, MD;  Location: [facility];  Service: Cardiopulmonary;;  . BRONCHIAL BRUSHINGS  08/24/2018   Procedure: BRONCHIAL BRUSHINGS;  Surgeon: Margaretha Seeds, MD;  Location: [facility];  Service: Cardiopulmonary;;  . BRONCHIAL NEEDLE ASPIRATION BIOPSY  08/24/2018   Procedure: BRONCHIAL NEEDLE ASPIRATION BIOPSIES;  Surgeon: Margaretha Seeds, MD;  Location: [facility];  Service: Cardiopulmonary;;  . CRANIOTOMY Right 09/19/2019   Procedure: CRANIOTOMY TUMOR EXCISION, FRONTAL;  Surgeon: Consuella Lose, MD;  Location: [facility];  Service: Neurosurgery;  Laterality: Right;  CRANIOTOMY TUMOR EXCISION, FRONTAL  . ENDOBRONCHIAL ULTRASOUND N/A 08/24/2018   Procedure: ENDOBRONCHIAL ULTRASOUND;  Surgeon: Margaretha Seeds, MD;  Location: [facility];  Service: Cardiopulmonary;  Laterality: N/A;  . IR IMAGING GUIDED PORT INSERTION  09/01/2019  . IR THORACENTESIS ASP PLEURAL SPACE W/IMG GUIDE  09/14/2018  . TUBAL LIGATION    . VIDEO BRONCHOSCOPY  08/24/2018   Procedure: VIDEO BRONCHOSCOPY;  Surgeon: Margaretha Seeds, MD;  Location:  [facility];  Service: Cardiopulmonary;;    I have reviewed the social history and family history with the  patient and they are unchanged from previous note.  ALLERGIES:  is allergic to penicillins.  MEDICATIONS:  Current Outpatient Medications  Medication Sig Dispense Refill  . acetaminophen (TYLENOL) 325 MG tablet Take 1,300 mg by mouth every 6 (six) hours as needed for mild pain or moderate pain.    . baclofen (LIORESAL) 10 MG tablet Take 1 tablet (10 mg total) by mouth 3 (three) times daily as needed for muscle spasms. 30 each 0  . diazepam (VALIUM) 10 MG tablet Take 1 tablet po 30 minutes prior to MRI or radiation (Patient taking differently: Take 20 mg by mouth See admin instructions. Take 1-2 tablets by mouth 30 minutes prior to MRI or radiation) 5 tablet 0  . dronabinol (MARINOL) 5 MG capsule Take 1 capsule (5 mg total) by mouth 2 (two) times daily before a meal. 60 capsule 0  . DULoxetine (CYMBALTA) 20 MG capsule Take 2 capsules (40 mg total) by mouth daily. Take 1 capsule by mouth daily for the first week, then increase to 2 caps daily (Patient taking differently: Take 40 mg by mouth daily. ) 60 capsule 1  . gabapentin (NEURONTIN) 300 MG capsule Take 1 tab in morning and 2 tabs at bedtime 90 capsule 2  . lidocaine (LIDODERM) 5 % Apply one patch to the area behind the affected ear as needed for pain (Patient taking differently: Place 1 patch onto the skin daily. ) 30 patch 0  . lidocaine-prilocaine (EMLA) cream Apply to affected area once 30 g 3  . LORazepam (ATIVAN) 1 MG tablet Take 1 tablet (1 mg total) by mouth every 12 (twelve) hours as needed for anxiety. 30 tablet 0  . ondansetron (ZOFRAN) 8 MG tablet Take 1 tablet (8 mg total) by mouth 2 (two) times daily as needed for refractory nausea / vomiting. Start on day 3 after carboplatin chemo. 30 tablet 1  . Oxycodone HCl 20 MG TABS Take 1 tablet (20 mg total) by mouth 3 (three) times daily as needed. 90 tablet 0  . prochlorperazine (COMPAZINE) 10 MG tablet Take 1 tablet (10 mg total) by mouth every 6 (six) hours as needed (Nausea or  vomiting). 30 tablet 1   No current facility-administered medications for this visit.    PHYSICAL EXAMINATION: ECOG PERFORMANCE STATUS: {CHL ONC ECOG [phone]}  There were no vitals filed for this visit. There were no vitals filed for this visit.  GENERAL:alert, no distress and comfortable SKIN: skin color, texture, turgor are normal, no rashes or significant lesions EYES: normal, Conjunctiva are pink and non-injected, sclera clear OROPHARYNX:no exudate, no erythema and lips, buccal mucosa, and tongue normal  NECK: supple, thyroid normal size, non-tender, without nodularity LYMPH:  no palpable lymphadenopathy in the cervical, axillary or inguinal LUNGS: clear to auscultation and percussion with normal breathing effort HEART: regular rate & rhythm and no murmurs and no lower extremity edema ABDOMEN:abdomen soft, non-tender and normal bowel sounds Musculoskeletal:no cyanosis of digits and no clubbing  NEURO: alert & oriented x 3 with fluent speech, no focal motor/sensory deficits  LABORATORY DATA:  I have reviewed the data as listed CBC Latest Ref Rng & Units 11/07/2019 09/19/2019 09/01/2019  WBC 4.0 - 10.5 K/uL 9.2 7.0 6.9  Hemoglobin 12.0 - 15.0 g/dL 11.7(L) 13.2 14.3  Hematocrit 36 - 46 % 38.6 42.0 44.7  Platelets 150 - 400 K/uL 446(H) 305 300  CMP Latest Ref Rng & Units 11/07/2019 09/19/2019 08/19/2019  Glucose 70 - 99 mg/dL 91 118(H) 120(H)  BUN 6 - 20 mg/dL 16 31(H) 28(H)  Creatinine 0.44 - 1.00 mg/dL 0.77 0.72 0.75  Sodium 135 - 145 mmol/L 141 143 142  Potassium 3.5 - 5.1 mmol/L 4.0 3.4(L) 4.0  Chloride 98 - 111 mmol/L 108 108 109  CO2 22 - 32 mmol/L _0 Calcium 8.9 - 10.3 mg/dL 9.5 10.0 9.7  Total Protein 6.5 - 8.1 g/dL 7.2 - -  Total Bilirubin 0.3 - 1.2 mg/dL <0.2(L) - -  Alkaline Phos 38 - 126 U/L 87 - -  AST 15 - 41 U/L 16 - -  ALT 0 - 44 U/L 13 - -      RADIOGRAPHIC STUDIES: I have personally reviewed the radiological images as listed and agreed  with the findings in the report. No results found.   ASSESSMENT & PLAN:  No problem-specific Assessment & Plan notes found for this encounter.   No orders of the defined types were placed in this encounter.  All questions were answered. The patient knows to call the clinic with any problems, questions or concerns. No barriers to learning was detected. I spent {CHL ONC TIME VISIT - HWEXH:[id number]} counseling the patient face to face. The total time spent in the appointment was {CHL ONC TIME VISIT - YBOFB:[id number]} and more than 50% was on counseling and review of test results     Marissa Feeling, NP 11/16/19

## 2019-11-16 NOTE — Telephone Encounter (Signed)
Left a voicemail requesting a call back to get Ms. Meder back in to see Dr. Mickeal Skinner.   Mont Dutton R.T.(R)(T) Radiation Special Procedures Navigator

## 2019-11-16 NOTE — Progress Notes (Signed)
Hold Fulphila today, chemo was given on 11/09/19  T.Jenetta Downer Dr Lavonda Jumbo, PharmD

## 2019-11-18 ENCOUNTER — Ambulatory Visit (HOSPITAL_COMMUNITY)
Admission: RE | Admit: 2019-11-18 | Discharge: 2019-11-18 | Disposition: A | Discharge status: Home / Self Care | Payer: Medicaid Other | Source: Ambulatory Visit | Attending: Hematology | Admitting: Hematology

## 2019-11-18 ENCOUNTER — Telehealth: Payer: Self-pay

## 2019-11-18 ENCOUNTER — Other Ambulatory Visit: Payer: Self-pay

## 2019-11-18 ENCOUNTER — Other Ambulatory Visit: Payer: Self-pay | Admitting: Radiation Therapy

## 2019-11-18 ENCOUNTER — Other Ambulatory Visit: Payer: Self-pay | Admitting: Hematology

## 2019-11-18 DIAGNOSIS — C3412 Malignant neoplasm of upper lobe, left bronchus or lung: Secondary | ICD-10-CM | POA: Diagnosis not present

## 2019-11-18 MED ORDER — DEXAMETHASONE 4 MG PO TABS: 8.0000 mg | ORAL_TABLET | Freq: Two times a day (BID) | ORAL | 0 refills | Status: DC

## 2019-11-18 MED ORDER — GADOBUTROL 1 MMOL/ML IV SOLN
6.0000 mL | Freq: Once | INTRAVENOUS | Status: AC | PRN
  Administered 2019-11-18: 6 mL via INTRAVENOUS

## 2019-11-18 NOTE — Telephone Encounter (Signed)
Several attempts made to make appt aware of new medication dexamethasone 2 tabs BID and appt with Dr. Mickeal Skinner. Despite several attempts unable to make contact with pt message left to call [facility]

## 2019-11-21 ENCOUNTER — Ambulatory Visit: Payer: Medicaid Other | Admitting: Internal Medicine

## 2019-11-23 ENCOUNTER — Other Ambulatory Visit: Payer: Self-pay | Admitting: Radiation Therapy

## 2019-11-23 ENCOUNTER — Other Ambulatory Visit: Payer: Self-pay

## 2019-11-23 ENCOUNTER — Ambulatory Visit
Admission: RE | Admit: 2019-11-23 | Discharge: 2019-11-23 | Disposition: A | Discharge status: Home / Self Care | Payer: Medicaid Other | Source: Ambulatory Visit | Attending: Radiation Oncology | Admitting: Radiation Oncology

## 2019-11-23 ENCOUNTER — Encounter: Payer: Self-pay | Admitting: Radiation Oncology

## 2019-11-23 ENCOUNTER — Telehealth: Payer: Self-pay | Admitting: Radiation Therapy

## 2019-11-23 DIAGNOSIS — C7931 Secondary malignant neoplasm of brain: Secondary | ICD-10-CM

## 2019-11-23 DIAGNOSIS — C3492 Malignant neoplasm of unspecified part of left bronchus or lung: Secondary | ICD-10-CM

## 2019-11-23 NOTE — Telephone Encounter (Addendum)
I was able to reach Ms. Rankin via her friend's phone  to speak with her about the upcoming [place] treatment planning appointments. We discussed her brain MRI on 11/4 and SIM on 11/5, she has these appointments written down and plans to make it. I will give her the remainder appointments and treatment details when she is here for her SIM, Cayli is happy with this plan.   Mont Dutton R.T.(R)(T) Radiation Special Procedures Navigator

## 2019-11-23 NOTE — Progress Notes (Addendum)
Radiation Oncology         [phone] ________________________________  Outpatient Reconsultation - Conducted via telephone due to current COVID-19 concerns for limiting patient exposure  I spoke with the patient to conduct this consult visit via telephone to spare the patient unnecessary potential exposure in the healthcare setting during the current COVID-19 pandemic. The patient was notified in advance and was offered a [place] meeting to allow for face to face communication but unfortunately reported that they did not have the appropriate resources/technology to support such a visit and instead preferred to proceed with a telephone consult.     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  09/28/2018-12/09/2018: The left lingula was treated to 60Gy in 28 fractions followed by a 6 Gy boost in 3 fractions.    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  She was counseled on the rationale for whole brain radiation and completed 30 Gy in 10 fractions in February of this year.  She was being followed in the brain oncology program and MRI in July 2020 showed concerns for progressive disease within the right frontal lesion.  Further discussion regarding her situation was to proceed with surgical resection, she did undergo craniotomy with excision of the frontal lesion in the  brain on 09/19/2019 and postoperative MRI showed postoperative changes without definite residual tumor.  There was a 1.2 cm infarct at the deep margin of the resection cavity, and she was supposed to follow-up with Dr. Mickeal Skinner to discuss next steps of additional adjuvant treatment but no showed.  She ultimately did have an MRI of the brain with and without contrast because of progressive symptoms of headache and problems with her right eye, this revealed significant progression of the enhancing tumor in the right frontal lobe measuring 6.7 x 5.2 x 6.2 cm.  There was multifocal dehiscence involving the right anterior cranial fossa floor with extension of tumor into the right superior orbit and minimal extension into the superior right ethmoid sinus.  There was also left word shift of 9 mm with partial effacement of the right lateral ventricle.  The patient was given a prescription for dexamethasone but has not picked this up or started this yet.  She is contacted today by phone to discuss options of treatment with salvage radiosurgery  On review of systems, the patient reports that she is not doing well at all. She reports progressive headaches and has not started taking dexamethasone yet. She reports she is having heaviness in her right eye, feeling that it is protruding further than her left eye, and is having trouble with getting comfortable at all. She feels pressure as well throughout her right face. No other complaints are verbalized.   PAST MEDICAL HISTORY:  Past Medical History:  Diagnosis Date  . Asthma   . Chronic headaches   . COPD (chronic obstructive  pulmonary disease) ([place])   . Depression   . Dyspnea    hospitalized July 2020  . Heart murmur   . Small cell lung cancer ([place])     PAST SURGICAL HISTORY: Past Surgical History:  Procedure Laterality Date  . APPLICATION OF CRANIAL NAVIGATION Right 09/19/2019   Procedure: APPLICATION OF CRANIAL NAVIGATION;  Surgeon: Consuella Lose, MD;   Location: [facility];  Service: Neurosurgery;  Laterality: Right;  APPLICATION OF CRANIAL NAVIGATION  . BRONCHIAL BIOPSY  08/24/2018   Procedure: BRONCHIAL BIOPSIES;  Surgeon: Margaretha Seeds, MD;  Location: [facility];  Service: Cardiopulmonary;;  . BRONCHIAL BRUSHINGS  08/24/2018   Procedure: BRONCHIAL BRUSHINGS;  Surgeon: Margaretha Seeds, MD;  Location: [facility];  Service: Cardiopulmonary;;  . BRONCHIAL NEEDLE ASPIRATION BIOPSY  08/24/2018   Procedure: BRONCHIAL NEEDLE ASPIRATION BIOPSIES;  Surgeon: Margaretha Seeds, MD;  Location: [facility];  Service: Cardiopulmonary;;  . CRANIOTOMY Right 09/19/2019   Procedure: CRANIOTOMY TUMOR EXCISION, FRONTAL;  Surgeon: Consuella Lose, MD;  Location: [facility];  Service: Neurosurgery;  Laterality: Right;  CRANIOTOMY TUMOR EXCISION, FRONTAL  . ENDOBRONCHIAL ULTRASOUND N/A 08/24/2018   Procedure: ENDOBRONCHIAL ULTRASOUND;  Surgeon: Margaretha Seeds, MD;  Location: [facility];  Service: Cardiopulmonary;  Laterality: N/A;  . IR IMAGING GUIDED PORT INSERTION  09/01/2019  . IR THORACENTESIS ASP PLEURAL SPACE W/IMG GUIDE  09/14/2018  . TUBAL LIGATION    . VIDEO BRONCHOSCOPY  08/24/2018   Procedure: VIDEO BRONCHOSCOPY;  Surgeon: Margaretha Seeds, MD;  Location: [facility];  Service: Cardiopulmonary;;    PAST SOCIAL HISTORY:  Social History   Socioeconomic History  . Marital status: Single    Spouse name: Not on file  . Number of children: Not on file  . Years of education: Not on file  . Highest education level: Not on file  Occupational History  . Not on file  Tobacco Use  . Smoking status: Current Some Day Smoker    Packs/day: 2.00    Years: 39.00    Pack years: 78.00    Types: Cigarettes    Start date: 06/22/1979  . Smokeless tobacco: Never Used  Vaping Use  . Vaping Use: Never used  Substance and Sexual Activity  . Alcohol use: Yes    Comment: occasional  . Drug use: Yes    Types: Marijuana    Comment: for appetite  . Sexual  activity: Yes    Birth control/protection: Surgical  Other Topics Concern  . Not on file  Social History Narrative  . Not on file   Social Determinants of Health   Financial Resource Strain:   . Difficulty of Paying Living Expenses: Not on file  Food Insecurity:   . Worried About Charity fundraiser in the Last Year: Not on file  . Ran Out of Food in the Last Year: Not on file  Transportation Needs:   . Lack of Transportation (Medical): Not on file  . Lack of Transportation (Non-Medical): Not on file  Physical Activity:   . Days of Exercise per Week: Not on file  . Minutes of Exercise per Session: Not on file  Stress:   . Feeling of Stress : Not on file  Social Connections:   . Frequency of Communication with Friends and Family: Not on file  . Frequency of Social Gatherings with Friends and Family: Not on file  . Attends Religious Services: Not on file  . Active Member of Clubs or Organizations: Not on file  . Attends Club  or Organization Meetings: Not on file  . Marital Status: Not on file  Intimate Partner Violence:   . Fear of Current or Ex-Partner: Not on file  . Emotionally Abused: Not on file  . Physically Abused: Not on file  . Sexually Abused: Not on file    PAST FAMILY HISTORY: Family History  Problem Relation Age of Onset  . Heart disease Mother   . Dementia Father     MEDICATIONS  Current Outpatient Medications  Medication Sig Dispense Refill  . acetaminophen (TYLENOL) 325 MG tablet Take 1,300 mg by mouth every 6 (six) hours as needed for mild pain or moderate pain.    . baclofen (LIORESAL) 10 MG tablet Take 1 tablet (10 mg total) by mouth 3 (three) times daily as needed for muscle spasms. 30 each 0  . dexamethasone (DECADRON) 4 MG tablet Take 2 tablets (8 mg total) by mouth 2 (two) times daily. 30 tablet 0  . diazepam (VALIUM) 10 MG tablet Take 2 tablets (20 mg total) by mouth See admin instructions. Take 1-2 tablets by mouth 30 minutes prior to MRI or  radiation 3 tablet 0  . dronabinol (MARINOL) 5 MG capsule Take 1 capsule (5 mg total) by mouth 2 (two) times daily before a meal. 60 capsule 0  . gabapentin (NEURONTIN) 300 MG capsule Take 1 tab in morning and 2 tabs at bedtime 90 capsule 2  . lidocaine (LIDODERM) 5 % Place 1 patch onto the skin daily. 20 patch 1  . lidocaine-prilocaine (EMLA) cream Apply to affected area once 30 g 3  . LORazepam (ATIVAN) 1 MG tablet Take 1 tablet (1 mg total) by mouth every 12 (twelve) hours as needed for anxiety. 30 tablet 0  . ondansetron (ZOFRAN) 8 MG tablet Take 1 tablet (8 mg total) by mouth 2 (two) times daily as needed for refractory nausea / vomiting. Start on day 3 after carboplatin chemo. 30 tablet 1  . Oxycodone HCl 20 MG TABS Take 1 tablet (20 mg total) by mouth 3 (three) times daily as needed. 90 tablet 0  . prochlorperazine (COMPAZINE) 10 MG tablet Take 1 tablet (10 mg total) by mouth every 6 (six) hours as needed (Nausea or vomiting). 30 tablet 1  . DULoxetine (CYMBALTA) 20 MG capsule Take 2 capsules (40 mg total) by mouth daily. Take 1 capsule by mouth daily for the first week, then increase to 2 caps daily (Patient not taking: Reported on 11/23/2019) 60 capsule 1   No current facility-administered medications for this encounter.    ALLERGIES:  Allergies  Allergen Reactions  . Penicillins Nausea And Vomiting    Did it involve swelling of the face/tongue/throat, SOB, or low BP? No Did it involve sudden or severe rash/hives, skin peeling, or any reaction on the inside of your mouth or nose? No Did you need to seek medical attention at a hospital or doctor's office? No When did it last happen?20 years If all above answers are "NO", may proceed with cephalosporin use.    PHYSICAL EXAM: Unable to assess due to encounter type.  Impression/Plan: 1. Metastatic, small cell carcinoma of the left lingula with brain metastasis. Dr. Lisbeth Renshaw discusses the pathology findings and reviews the nature  of progressive disease in the brain.  Dr. Lisbeth Renshaw discusses the risks of reirradiation but strongly recommends consideration of stereotactic radiosurgery in a fractionated course over approximately 5 fractions given the size and distribution of her disease. We discussed the risks, benefits, short, and long term effects of  radiotherapy, and the patient is interested in proceeding. Dr. Lisbeth Renshaw discusses the delivery and logistics of radiotherapy.  Unfortunately she does need a 3T MRI scan to plan further treatment so she will need to undergo this procedure. Our navigator is aware of what is going on and is trying to coordinate for her to have an MRI scan early next week as Dr. Burr Medico has responded back to [place] saying that she does not anticipate a role for more systemic therapy at first we were trying to work around her systemic therapy prior to plan her radiosurgery the week of 12/12/19 but given the size of the lesion and symptomatology it is causing, Dr. Burr Medico would not recommend postponing treatment for systemic therapy. In the meantime I have strongly encouraged the patient to start taking dexamethasone which hopefully will improve her pain. Dr. Burr Medico has written this for 8 mg twice daily, and we would agree with this dose for the immediate future, with plans of tapering this as appropriate clinically, we would also appreciate Dr. Renda Rolls input regarding tapering this given the size of the lesion.  2. Claustrophobia. The patient will use Ativan po prior to MRIs or radiation.   Given current concerns for patient exposure during the COVID-19 pandemic, this encounter was conducted via telephone.  The patient has provided two factor identification and has given verbal consent for this type of encounter and has been advised to only accept a meeting of this type in a secure network environment. The time spent during this encounter was 60 minutes including preparation, discussion, and coordination of the patient's  care. The attendants for this meeting include Blenda Nicely, RN, Dr. Lisbeth Renshaw, Hayden Pedro  and Oletta Lamas.  During the encounter,   Dr. Lisbeth Renshaw, and Hayden Pedro were located at [facility].  Shammara Jarrett was located at home.    The above documentation reflects my direct findings during this shared patient visit. Please see the separate note by Dr. Lisbeth Renshaw on this date for the remainder of the patient's plan of care.     Carola Rhine, PAC

## 2019-11-23 NOTE — Progress Notes (Addendum)
Orders placed for Port access at GI the day of brain MRI.  Mont Dutton R.T.(R)(T) Radiation Special Procedures Navigator

## 2019-11-28 ENCOUNTER — Telehealth: Payer: Self-pay

## 2019-11-28 ENCOUNTER — Inpatient Hospital Stay: Payer: Medicaid Other

## 2019-11-28 ENCOUNTER — Inpatient Hospital Stay: Payer: Medicaid Other | Attending: Hematology | Admitting: Hematology

## 2019-11-28 DIAGNOSIS — D3502 Benign neoplasm of left adrenal gland: Secondary | ICD-10-CM | POA: Insufficient documentation

## 2019-11-28 DIAGNOSIS — F32A Depression, unspecified: Secondary | ICD-10-CM | POA: Insufficient documentation

## 2019-11-28 DIAGNOSIS — C7931 Secondary malignant neoplasm of brain: Secondary | ICD-10-CM | POA: Insufficient documentation

## 2019-11-28 DIAGNOSIS — Z79899 Other long term (current) drug therapy: Secondary | ICD-10-CM | POA: Insufficient documentation

## 2019-11-28 DIAGNOSIS — I2510 Atherosclerotic heart disease of native coronary artery without angina pectoris: Secondary | ICD-10-CM | POA: Insufficient documentation

## 2019-11-28 DIAGNOSIS — F17210 Nicotine dependence, cigarettes, uncomplicated: Secondary | ICD-10-CM | POA: Insufficient documentation

## 2019-11-28 DIAGNOSIS — J449 Chronic obstructive pulmonary disease, unspecified: Secondary | ICD-10-CM | POA: Insufficient documentation

## 2019-11-28 DIAGNOSIS — C3412 Malignant neoplasm of upper lobe, left bronchus or lung: Secondary | ICD-10-CM | POA: Insufficient documentation

## 2019-11-28 DIAGNOSIS — I700 Atherosclerosis of aorta: Secondary | ICD-10-CM | POA: Insufficient documentation

## 2019-11-28 NOTE — Telephone Encounter (Signed)
I left ms Imm a message that she missed her appts today.  I requested she return my call.

## 2019-11-29 ENCOUNTER — Inpatient Hospital Stay: Payer: Medicaid Other

## 2019-11-29 ENCOUNTER — Other Ambulatory Visit: Payer: Self-pay

## 2019-11-29 ENCOUNTER — Inpatient Hospital Stay (HOSPITAL_BASED_OUTPATIENT_CLINIC_OR_DEPARTMENT_OTHER): Payer: Medicaid Other | Admitting: Internal Medicine

## 2019-11-29 ENCOUNTER — Ambulatory Visit: Payer: Medicaid Other | Admitting: Internal Medicine

## 2019-11-29 VITALS — BP 124/77 | HR 108 | Temp 98.1°F | Resp 18 | Ht 62.0 in | Wt 152.6 lb

## 2019-11-29 DIAGNOSIS — I2510 Atherosclerotic heart disease of native coronary artery without angina pectoris: Secondary | ICD-10-CM | POA: Diagnosis not present

## 2019-11-29 DIAGNOSIS — C7931 Secondary malignant neoplasm of brain: Secondary | ICD-10-CM

## 2019-11-29 DIAGNOSIS — I700 Atherosclerosis of aorta: Secondary | ICD-10-CM | POA: Diagnosis not present

## 2019-11-29 DIAGNOSIS — D3502 Benign neoplasm of left adrenal gland: Secondary | ICD-10-CM | POA: Diagnosis not present

## 2019-11-29 DIAGNOSIS — C3412 Malignant neoplasm of upper lobe, left bronchus or lung: Secondary | ICD-10-CM | POA: Diagnosis not present

## 2019-11-29 DIAGNOSIS — F17210 Nicotine dependence, cigarettes, uncomplicated: Secondary | ICD-10-CM | POA: Diagnosis not present

## 2019-11-29 DIAGNOSIS — J449 Chronic obstructive pulmonary disease, unspecified: Secondary | ICD-10-CM | POA: Diagnosis not present

## 2019-11-29 DIAGNOSIS — Z79899 Other long term (current) drug therapy: Secondary | ICD-10-CM | POA: Diagnosis not present

## 2019-11-29 DIAGNOSIS — F32A Depression, unspecified: Secondary | ICD-10-CM | POA: Diagnosis not present

## 2019-11-29 MED ORDER — DEXAMETHASONE 4 MG PO TABS: 4.0000 mg | ORAL_TABLET | Freq: Two times a day (BID) | ORAL | 3 refills | Status: AC

## 2019-11-29 NOTE — Progress Notes (Signed)
[facility] [place], [place] [address] [phone]   Interval Evaluation  Date of Service: 11/29/19 Patient Name: Marissa [name] Patient MRN: [id number] Patient DOB: Sep 29, 1965 Provider: Ventura Sellers, MD  Identifying Statement:  Marissa [name] is a 54 y.o. female with Brain metastases ([place]) [C79.31]  Primary Cancer:  Oncologic History: Oncology History Overview Note  Cancer Staging Small cell lung cancer, left upper lobe [facility]) Staging form: Lung, AJCC 8th Edition - Clinical stage from 08/24/2018: Stage IIIA (cT1a, cN2, cM0) - Signed by [name], MD on 10/17/2018    Small cell lung cancer, left upper lobe ([place])  07/29/2018 Imaging   CT Angio Chest 07/29/18  IMPRESSION: 1. Malignant appearing left mediastinal and hilar lymphadenopathy narrowing the airways at the left hilum. In the left lung only a subtle 9 mm spiculated nodule is identified in the lingula. Consider small cell carcinoma. Bronchoscopy or mediastinal node sampling might be most valuable for diagnosis. 2. Indeterminate although low-density (which typically which indicates a benign adenoma) left adrenal nodule. 3.  Negative for acute pulmonary embolus.   08/24/2018 Definitive Surgery   Bronchoscopy and EBUS biopsy on 08/24/18 by Dr. Loanne Drilling   08/24/2018 Initial Biopsy   Diagnosis 08/24/18 FINE NEEDLE ASPIRATION, ENDOSCOPIC, (EBUS) STATION # 7 LYMPH NODE(SPECIMEN 1 OF 4 COLLECTED 08/24/18): NO MALIGNANT CELLS IDENTIFIED.  FINE NEEDLE ASPIRATION, ENDOSCOPIC, (EBUS) LEFT HILAR LYMPH NODE(SPECIMEN 2 OF 4 COLLECTED 08/24/18): NO MALIGNANT CELLS IDENTIFIED.  TRANSBRONCHIAL NEEDLE ASPIRATION, WANG, (SPECIMEN 3 OF 4 COLLECTED 08/24/18): NO MALIGNANT CELLS IDENTIFIED.  BRONCHIAL BRUSHING, LUL (SPECIMEN 4 OF 4 COLLECTED 08/24/18): MALIGNANT CELLS PRESENT, MOST CONSISTENT WITH SMALL CELL CARCINOMA. SEE COMMENT.   08/24/2018 Cancer Staging   Staging form: Lung, AJCC 8th  Edition - Clinical stage from 08/24/2018: Stage IIIA (cT1a, cN2, cM0) - Signed by [name], MD on 10/17/2018   08/26/2018 Initial Diagnosis   Small cell lung cancer, left ([place])   08/26/2018 Imaging   CT AP W Contrast 08/26/18  IMPRESSION: 1. 2.9 cm left adrenal nodule cannot be definitively characterized on this study. Potentially a lipid poor adenoma, but metastatic disease not excluded. MRI of the abdomen with and without contrast may prove helpful to further evaluate. 2. No other findings to suggest metastatic disease in the abdomen/pelvis. 3. Left base atelectasis with small left pleural effusion.   08/27/2018 Imaging   MRI brain 08/27/18  IMPRESSION: 1. Motion degraded, incomplete examination. 2. Subcentimeter focus of diffusion signal abnormality in the left cerebellum. No enhancement is evident to strongly suggest a metastasis, and this may reflect an acute to subacute small vessel infarct. 3. No evidence of intracranial metastatic disease elsewhere. 4. Given motion artifact and incomplete postcontrast imaging, consider short-term follow-up MRI (possibly with sedation) to further evaluate the left cerebellar abnormality and provide a more thorough evaluation for metastatic disease.   09/02/2018 PET scan   IMPRESSION: 1. Hypermetabolic left hilar mass is identified encasing the left mainstem bronchus resulting in postobstructive atelectasis in the left upper lobe. 2. Hypermetabolic left mediastinal nodal metastasis. 3. Loculated left pleural effusion is identified with mild increased FDG uptake. Cannot rule out malignant pleural effusion. 4. No evidence for metastatic disease to the abdomen or pelvis or skeletal structures.     09/07/2018 - 11/10/2018 Chemotherapy   Concurrent chemoRT with IV cisplatin on day 1 and etopside day 1-3 for every 3 weeks starting 8/11. Will start radiation with Dr. Lisbeth Renshaw with cycle 2. She completed 4 cycles  on 11/10/18   09/14/2018 Procedure    Thoracentesis yielding 750 mL   09/28/2018 - 12/09/2019 Radiation Therapy   Concurrent chemoRT with Dr. Lisbeth Renshaw. Start with cycle 2 chemo on 09/28/18 and completed on 12/09/18.    02/10/2019 Imaging   CT CAP W Contrast  IMPRESSION: 1. Interval response to therapy. Significant interval reduction in tumor burden involving the left hemithorax. 2. No new or progressive disease identified within the chest, abdomen or pelvis. 3. Emphysema and aortic atherosclerosis. 4. Stable left adrenal nodule.   Aortic Atherosclerosis (ICD10-I70.0) and Emphysema (ICD10-J43.9).   02/11/2019 Imaging   MRI Brain  IMPRESSION: 6 x 10 mm solitary metastatic deposit right frontal lobe with minimal edema.   02/28/2019 - 03/14/2019 Radiation Therapy   Target Brain Radiation with Dr. Lisbeth Renshaw    05/19/2019 Imaging   CT CAP w contrast  IMPRESSION: 1. Trace residual soft tissue thickening at the left lung apex and along the AP window. Slight increase in size of a low left paratracheal lymph node. Continued attention on follow-up exams is warranted. No evidence of distant metastatic disease. 2. Subpleural density in the posterior left upper lobe is new and may be due to atelectasis. Recommend attention on follow-up. 3. Left adrenal adenoma. 4. Aortic atherosclerosis (ICD10-I70.0). Coronary artery calcification. 5.  Emphysema (ICD10-J43.9).   07/13/2019 Imaging   MRI brain  IMPRESSION: 1. Increased size of right frontal lobe metastasis with new moderate edema. 2. No evidence of new intracranial metastases.     08/10/2019 Imaging   CT CAP W Contrast  IMPRESSION: 1. New and progressive adenopathy within the mediastinum and low left neck, consistent with nodal metastasis. 2. No evidence of pulmonary metastasis. Similar areas of probable scarring and pleural thickening. 3. No subdiaphragmatic metastatic disease identified. 4. Left adrenal adenoma. 5. Age advanced aortic atherosclerosis (ICD10-I70.0) and  emphysema (ICD10-J43.9).     08/19/2019 Imaging   Ct Angio Chest  IMPRESSION: 1. No evidence of pulmonary embolus. 2. Stable nodal metastasis within the mediastinum and left internal jugular chain. 3. Interval development of mild interlobular septal thickening and ground-glass opacity, greatest in the right upper lobe and left lower lobe. Differential includes asymmetric edema versus atypical infection. 4. Aortic Atherosclerosis (ICD10-I70.0) and Emphysema (ICD10-J43.9).     08/25/2019 Imaging   MRI Brain  IMPRESSION: Increased size of centrally necrotic enhancing right frontal lesion measuring 4.0 cm, previously 2.6 cm.   White matter edema and dural thickening/enhancement involving the right frontal convexity is more conspicuous than prior exam.   No new enhancing lesion.   09/01/2019 Procedure   PAC placement    09/19/2019 Imaging   MRI brain  IMPRESSION: 1. Postoperative changes from interval right frontal craniotomy for resection of previously identified right frontal lobe mass. Somewhat ill-defined parenchymal enhancement about the resection cavity favored to reflect postoperative changes. No definite residual tumor seen on this immediate postoperative scan. 2. Small 1.2 cm infarct at the deep margin of the resection cavity. 3. Interval increase in T2/FLAIR signal intensity involving the adjacent right frontal region with increased regional mass effect and up to 6 mm of right-to-left shift.   09/19/2019 Surgery   Right CRANIOTOMY TUMOR EXCISION, FRONTAL and APPLICATION OF CRANIAL NAVIGATION by Dr Kathyrn Sheriff   09/19/2019 Pathology Results   FINAL MICROSCOPIC DIAGNOSIS:   A. BRAIN TUMOR, RIGHT FRONTAL, EXCISION:  - Small cell carcinoma.   B. BRAIN TUMOR, RIGHT FRONTAL DURA, EXCISION:  - Small cell carcinoma.    COMMENT:   CK8/18 (dot-like), Synaptophysin and Chromogranin  are positive.  CKAE1AE3, CK7 and Chromogranin are noncontributory.  Ki-67 proliferation   index reaches 70-80%.  The morphology and immunophenotype are compatible  with the provided clinical history of small cell carcinoma.  Dr. Melina Copa  reviewed.    10/27/2019 Imaging   IMPRESSION: 1. Significant interval enlargement of bulky mediastinal and left hilar lymphadenopathy. There are new pleural nodules or prevascular lymph nodes. 2. Significant interval increase in masslike consolidation about the left hilum. There is interlobular septal thickening and scattered ground-glass and heterogeneous airspace opacity throughout the left lung, predominantly involving the left upper lobe and concerning for lymphangitic spread and postobstructive airspace disease. 3. There is somewhat nodular pleural thickening about the medial left upper lobe and a new small left pleural effusion. 4. Findings are consistent with worsened lung malignancy with pleural and nodal metastatic disease. 5. No significant interval change in a left adrenal mass, likely an incidental adenoma. Attention on follow-up. 6. No definite evidence of distant metastatic disease within the abdomen or pelvis. 7. Emphysema (ICD10-J43.9). 8. Aortic Atherosclerosis (ICD10-I70.0)   11/07/2019 -  Chemotherapy   First-line chemo Carboplatin day 1, Etopside day 1-3 with immunotherapy Tecentriq day 1 q3weeks starting 11/07/19   11/18/2019 Imaging   MRI Brain  IMPRESSION: 1. New heterogenously enhancing right anterior and middle cranial fossa tumor measuring 6.7 x 5.2 x 6.2 cm. 2. Increased leftward midline shift of 0.9 cm with leftward displacement of the ACAs. The ACAs remain patent. 3. New mild mass effect upon the right midbrain. 4. Confluent T2/FLAIR hyperintense right cerebrum signal is increased since prior exam. 5. Multifocal dehiscence involving the right anterior cranial fossa floor with extension of tumor into the superior ethmoid and superior right orbit.     Interaval History:  Marissa [name] presents today  for follow up after recent MRI brain.  She is currently experiencing daily severe headaches, some of which wake her up at night.  Denies focal limb weakness but does have imbalance and has had multiple falls.  Not typically needing a cane or walker at this point. Still describes dizziness, fatigue, poor energy. Missed chemotherapy appointments this week.  She doesn't think she has dosed any dexamethasone yet.  H+P (02/21/19) Patient describes years long history of frequent headaches.  They are described as lateralized throbbing with nausea and photophobia, lasting for hours up to an entire day.  For the past month her headaches have been daily and of particular burden.  No time of day or positional association.  For cancer associated LUQ pain, she has been taking oxycodone 79m 5-6x per day over the past ~6 months.  Prior to that she had some chronic use of opiates for back pain.  Also reports 2-3x per day usage of "goody powder" although she stopped that several days ago.  Smokes marijuana frequently and takes ativan for anxiety symptoms.  Currently scheduled for WBRT for small R frontal metastasis identified on screening MRI.  SIM'd today.  Currently on observation for small cell lung ca.    Medications: Current Outpatient Medications on File Prior to Visit  Medication Sig Dispense Refill  . dronabinol (MARINOL) 5 MG capsule Take 1 capsule (5 mg total) by mouth 2 (two) times daily before a meal. 60 capsule 0  . DULoxetine (CYMBALTA) 20 MG capsule Take 2 capsules (40 mg total) by mouth daily. Take 1 capsule by mouth daily for the first week, then increase to 2 caps daily 60 capsule 1  . gabapentin (NEURONTIN) 300 MG capsule Take 1 tab  in morning and 2 tabs at bedtime 90 capsule 2  . lidocaine (LIDODERM) 5 % Place 1 patch onto the skin daily. 20 patch 1  . lidocaine-prilocaine (EMLA) cream Apply to affected area once 30 g 3  . LORazepam (ATIVAN) 1 MG tablet Take 1 tablet (1 mg total) by mouth every 12  (twelve) hours as needed for anxiety. 30 tablet 0  . ondansetron (ZOFRAN) 8 MG tablet Take 1 tablet (8 mg total) by mouth 2 (two) times daily as needed for refractory nausea / vomiting. Start on day 3 after carboplatin chemo. 30 tablet 1  . Oxycodone HCl 20 MG TABS Take 1 tablet (20 mg total) by mouth 3 (three) times daily as needed. 90 tablet 0  . prochlorperazine (COMPAZINE) 10 MG tablet Take 1 tablet (10 mg total) by mouth every 6 (six) hours as needed (Nausea or vomiting). 30 tablet 1  . acetaminophen (TYLENOL) 325 MG tablet Take 1,300 mg by mouth every 6 (six) hours as needed for mild pain or moderate pain. (Patient not taking: Reported on 11/29/2019)    . baclofen (LIORESAL) 10 MG tablet Take 1 tablet (10 mg total) by mouth 3 (three) times daily as needed for muscle spasms. (Patient not taking: Reported on 11/29/2019) 30 each 0  . diazepam (VALIUM) 10 MG tablet Take 2 tablets (20 mg total) by mouth See admin instructions. Take 1-2 tablets by mouth 30 minutes prior to MRI or radiation (Patient not taking: Reported on 11/29/2019) 3 tablet 0   No current facility-administered medications on file prior to visit.    Allergies:  Allergies  Allergen Reactions  . Penicillins Nausea And Vomiting    Did it involve swelling of the face/tongue/throat, SOB, or low BP? No Did it involve sudden or severe rash/hives, skin peeling, or any reaction on the inside of your mouth or nose? No Did you need to seek medical attention at a hospital or doctor's office? No When did it last happen?20 years If all above answers are "NO", may proceed with cephalosporin use.   Past Medical History:  Past Medical History:  Diagnosis Date  . Asthma   . Chronic headaches   . COPD (chronic obstructive pulmonary disease) ([place])   . Depression   . Dyspnea    hospitalized July 2020  . Heart murmur   . Small cell lung cancer [facility])    Past Surgical History:  Past Surgical History:  Procedure Laterality Date  .  APPLICATION OF CRANIAL NAVIGATION Right 09/19/2019   Procedure: APPLICATION OF CRANIAL NAVIGATION;  Surgeon: Consuella Lose, MD;  Location: [place];  Service: Neurosurgery;  Laterality: Right;  APPLICATION OF CRANIAL NAVIGATION  . BRONCHIAL BIOPSY  08/24/2018   Procedure: BRONCHIAL BIOPSIES;  Surgeon: Margaretha Seeds, MD;  Location: [facility];  Service: Cardiopulmonary;;  . BRONCHIAL BRUSHINGS  08/24/2018   Procedure: BRONCHIAL BRUSHINGS;  Surgeon: Margaretha Seeds, MD;  Location: [facility];  Service: Cardiopulmonary;;  . BRONCHIAL NEEDLE ASPIRATION BIOPSY  08/24/2018   Procedure: BRONCHIAL NEEDLE ASPIRATION BIOPSIES;  Surgeon: Margaretha Seeds, MD;  Location: [facility];  Service: Cardiopulmonary;;  . CRANIOTOMY Right 09/19/2019   Procedure: CRANIOTOMY TUMOR EXCISION, FRONTAL;  Surgeon: Consuella Lose, MD;  Location: [place];  Service: Neurosurgery;  Laterality: Right;  CRANIOTOMY TUMOR EXCISION, FRONTAL  . ENDOBRONCHIAL ULTRASOUND N/A 08/24/2018   Procedure: ENDOBRONCHIAL ULTRASOUND;  Surgeon: Margaretha Seeds, MD;  Location: [facility];  Service: Cardiopulmonary;  Laterality: N/A;  . IR IMAGING GUIDED PORT INSERTION  09/01/2019  .  IR THORACENTESIS ASP PLEURAL SPACE W/IMG GUIDE  09/14/2018  . TUBAL LIGATION    . VIDEO BRONCHOSCOPY  08/24/2018   Procedure: VIDEO BRONCHOSCOPY;  Surgeon: Margaretha Seeds, MD;  Location: [facility];  Service: Cardiopulmonary;;   Social History:  Social History   Socioeconomic History  . Marital status: Single    Spouse name: Not on file  . Number of children: Not on file  . Years of education: Not on file  . Highest education level: Not on file  Occupational History  . Not on file  Tobacco Use  . Smoking status: Current Some Day Smoker    Packs/day: 2.00    Years: 39.00    Pack years: 78.00    Types: Cigarettes    Start date: 06/22/1979  . Smokeless tobacco: Never Used  Vaping Use  . Vaping Use: Never used  Substance and Sexual  Activity  . Alcohol use: Yes    Comment: occasional  . Drug use: Yes    Types: Marijuana    Comment: for appetite  . Sexual activity: Yes    Birth control/protection: Surgical  Other Topics Concern  . Not on file  Social History Narrative  . Not on file   Social Determinants of Health   Financial Resource Strain:   . Difficulty of Paying Living Expenses: Not on file  Food Insecurity:   . Worried About Charity fundraiser in the Last Year: Not on file  . Ran Out of Food in the Last Year: Not on file  Transportation Needs:   . Lack of Transportation (Medical): Not on file  . Lack of Transportation (Non-Medical): Not on file  Physical Activity:   . Days of Exercise per Week: Not on file  . Minutes of Exercise per Session: Not on file  Stress:   . Feeling of Stress : Not on file  Social Connections:   . Frequency of Communication with Friends and Family: Not on file  . Frequency of Social Gatherings with Friends and Family: Not on file  . Attends Religious Services: Not on file  . Active Member of Clubs or Organizations: Not on file  . Attends Archivist Meetings: Not on file  . Marital Status: Not on file  Intimate Partner Violence:   . Fear of Current or Ex-Partner: Not on file  . Emotionally Abused: Not on file  . Physically Abused: Not on file  . Sexually Abused: Not on file   Family History:  Family History  Problem Relation Age of Onset  . Heart disease Mother   . Dementia Father     Review of Systems: Constitutional: Doesn't report fevers, chills or abnormal weight loss Eyes: Doesn't report blurriness of vision Ears, nose, mouth, throat, and face: Doesn't report sore throat Respiratory: Doesn't report cough, dyspnea or wheezes Cardiovascular: Doesn't report palpitation, chest discomfort  Gastrointestinal:  Doesn't report nausea, constipation, diarrhea GU: Doesn't report incontinence Skin: Doesn't report skin rashes Neurological: Per  HPI Musculoskeletal: Back pain Behavioral/Psych: Doesn't report anxiety  Physical Exam: Vitals:   11/29/19 1341  BP: 124/77  Pulse: (!) 108  Resp: 18  Temp: 98.1 F (36.7 C)  SpO2: 98%    KPS: 70. General: disheveled Head: craniotomy scar EENT: No conjunctival injection or scleral icterus.  Lungs: Resp effort normal Cardiac: Regular rate Abdomen: Non-distended abdomen Skin: No rashes cyanosis or petechiae. Extremities: No clubbing or edema  Neurologic Exam: Mental Status: Awake, alert, attentive to examiner. Oriented to self and environment. Language  is fluent with intact comprehension.  Age advanced psychomotor slowing, poor insight into condition. Cranial Nerves: Visual acuity is grossly normal. Visual fields are full. Extra-ocular movements intact. R eye ptosis. Face is symmetric Motor: Tone and bulk are normal. Power is full in both arms and legs.  Sensory: Intact to light touch Gait: Dystaxic  Labs: I have reviewed the data as listed    Component Value Date/Time   NA 142 11/16/2019 1101   K 3.5 11/16/2019 1101   CL 106 11/16/2019 1101   CO2 27 11/16/2019 1101   GLUCOSE 122 (H) 11/16/2019 1101   BUN 25 (H) 11/16/2019 1101   CREATININE 0.91 11/16/2019 1101   CALCIUM 10.0 11/16/2019 1101   PROT 7.4 11/16/2019 1101   ALBUMIN 3.7 11/16/2019 1101   AST 23 11/16/2019 1101   ALT 40 11/16/2019 1101   ALKPHOS 90 11/16/2019 1101   BILITOT 0.3 11/16/2019 1101   GFRNONAA >60 11/16/2019 1101   GFRAA >60 09/19/2019 1244   GFRAA 58 (L) 08/18/2019 1339   Lab Results  Component Value Date   WBC 4.0 11/16/2019   NEUTROABS 3.2 11/16/2019   HGB 10.9 (L) 11/16/2019   HCT 34.9 (L) 11/16/2019   MCV 87.9 11/16/2019   PLT 240 11/16/2019   Imaging:  [place] Clinician Interpretation: I have personally reviewed the CNS images as listed.  My interpretation, in the context of the patient's clinical presentation, is progressive disease  MR Brain W Wo Contrast  Addendum Date:  11/18/2019   ADDENDUM REPORT: 11/18/2019 09:53 ADDENDUM: These results were called by telephone at the time of interpretation on 11/18/2019 at 9:31 am to provider [name] , who verbally acknowledged these results. Electronically Signed   By: Primitivo Gauze M.D.   On: 11/18/2019 09:53   Result Date: 11/18/2019 CLINICAL DATA:  Brain/CNS neoplasm, assess treatment response. EXAM: MRI HEAD WITHOUT AND WITH CONTRAST TECHNIQUE: Multiplanar, multiecho pulse sequences of the brain and surrounding structures were obtained without and with intravenous contrast. CONTRAST:  76m GADAVIST GADOBUTROL 1 MMOL/ML IV SOLN COMPARISON:  09/20/2019 MRI brain and prior. FINDINGS: Brain: Sequela of right frontal craniotomy for mass resection. There is new bulky heterogenously enhancing tumor measuring 6.7 x 5.2 x 6.2 cm (16:74, 18:8) along the inferior resection cavity margin extending into the anterior right middle cranial fossa. There is multifocal dehiscence involving the right anterior cranial fossa floor with extension of tumor into the superior right orbit (17:24). There is also minimal extension of tumor into the superior right ethmoid (17:22). Increased leftward midline shift of 0.9 cm (16:95) with partial effacement of the right lateral ventricle. No ventriculomegaly to suggest entrapment. Confluent T2/FLAIR hyperintense signal involving the right cerebrum has increased since prior exam. Abnormal T2/FLAIR hyperintense signal also involves the right basal ganglia/thalamus and traverses the anterior corpus callosum most prominent at the genu (11:30), new since prior exam. New mild mass effect upon the right midbrain. Background scattered T2/FLAIR hyperintense foci within the left cerebrum and pons. No extra-axial fluid collection. Dural prominence overlying the right frontal convexity. Vascular: Leftward displacement of the ACAs secondary to mass effect, however they remain patent. Major intracranial flow voids are  proximally patent. Skull and upper cervical spine: Sequela of right frontal craniotomy. Right anterior cranial fossa dehiscence as detailed above. Sinuses/Orbits: Normal left orbit. Right orbit discussed above. Remaining paranasal sinuses are pneumatized. Trace bilateral mastoid free fluid. Other: None. IMPRESSION: 1. New heterogenously enhancing right anterior and middle cranial fossa tumor measuring 6.7 x 5.2 x 6.2  cm. 2. Increased leftward midline shift of 0.9 cm with leftward displacement of the ACAs. The ACAs remain patent. 3. New mild mass effect upon the right midbrain. 4. Confluent T2/FLAIR hyperintense right cerebrum signal is increased since prior exam. 5. Multifocal dehiscence involving the right anterior cranial fossa floor with extension of tumor into the superior ethmoid and superior right orbit. Electronically Signed: By: Primitivo Gauze M.D. On: 11/18/2019 09:30      Assessment/Plan Brain metastases [facility]) [C79.31]  Ms. Gehlhausen presents today with severe and rapid progression of right frontal metastasis secondary to small cell lung cancer.  Changes are not consistent with treatment effect.  Tumor is too large to be amenable to resection.  Her case was discussed in brain/spine tumor board, recommendation was made for palliative only fractionated radiotherapy.  She understands that tumor is likely to be refractory to this intervention and this may accomplish some prolongation of life only.    It is unclear if she has been dosing decadron, most likely no based on review of medications in her purse.  We have recommended 52m BID decadron for now, this can be adjusted downwards after radiation.  Dr. FBurr Medicodoes have plans for systemic therapy but she did not show up for chemo yesterday.  It would be prudent to consider hospice given aggressive CNS pattern but patient still wants some life prolonging treatment at this time.  We appreciate the opportunity to participate in the care of ABrittanee [name]  She has simulation scheduled for RT, we can follow up with her in 3 months following post-RT MRI or sooner as needed.  All questions were answered. The patient knows to call the clinic with any problems, questions or concerns. No barriers to learning were detected.  The total time spent in the encounter was 40 minutes and more than 50% was on counseling and review of test results   ZVentura Sellers MD Medical Director of Neuro-Oncology [facility] LPat WKirkland11/02/21 3:18 PM

## 2019-11-30 ENCOUNTER — Telehealth: Payer: Self-pay | Admitting: Internal Medicine

## 2019-11-30 ENCOUNTER — Inpatient Hospital Stay: Payer: Medicaid Other

## 2019-11-30 NOTE — Telephone Encounter (Signed)
Scheduled per 11/2 los. Unable to reach pt. Voicemail is full. Mailing pt appt calendar.

## 2019-12-01 ENCOUNTER — Inpatient Hospital Stay: Admission: RE | Admit: 2019-12-01 | Payer: Medicaid Other | Source: Ambulatory Visit

## 2019-12-02 ENCOUNTER — Inpatient Hospital Stay: Admission: RE | Admit: 2019-12-02 | Payer: Medicaid Other | Source: Ambulatory Visit

## 2019-12-02 ENCOUNTER — Ambulatory Visit: Payer: Medicaid Other | Admitting: Radiation Oncology

## 2019-12-02 ENCOUNTER — Inpatient Hospital Stay: Payer: Medicaid Other

## 2019-12-02 ENCOUNTER — Ambulatory Visit: Payer: Medicaid Other

## 2019-12-05 ENCOUNTER — Ambulatory Visit: Payer: Medicaid Other

## 2019-12-05 ENCOUNTER — Inpatient Hospital Stay: Payer: Medicaid Other

## 2019-12-05 ENCOUNTER — Ambulatory Visit: Payer: Medicaid Other | Admitting: Radiation Oncology

## 2019-12-05 NOTE — Progress Notes (Signed)
Has armband been applied?  Yes  Does patient have an allergy to IV contrast dye?: No   Has patient ever received premedication for IV contrast dye?: n/a  Does patient take metformin?: no  If patient does take metformin when was the last dose: n/a  Date of lab work: 11/16/2019 BUN: 25 CR: 0.91 eGfr: >60  IV site: Right Chest Port  Has IV site been added to flowsheet?

## 2019-12-06 ENCOUNTER — Ambulatory Visit
Admission: RE | Admit: 2019-12-06 | Discharge: 2019-12-06 | Disposition: A | Discharge status: Home / Self Care | Payer: Medicaid Other | Source: Ambulatory Visit | Attending: Radiation Oncology | Admitting: Radiation Oncology

## 2019-12-06 ENCOUNTER — Telehealth: Payer: Self-pay | Admitting: Nurse Practitioner

## 2019-12-06 ENCOUNTER — Other Ambulatory Visit: Payer: Self-pay

## 2019-12-06 VITALS — BP 85/65 | HR 121 | Temp 98.3°F | Resp 20 | Ht 62.0 in | Wt 151.2 lb

## 2019-12-06 DIAGNOSIS — C3412 Malignant neoplasm of upper lobe, left bronchus or lung: Secondary | ICD-10-CM

## 2019-12-06 DIAGNOSIS — C7931 Secondary malignant neoplasm of brain: Secondary | ICD-10-CM | POA: Diagnosis present

## 2019-12-06 MED ORDER — SODIUM CHLORIDE 0.9% FLUSH
10.0000 mL | Freq: Once | INTRAVENOUS | Status: AC
  Administered 2019-12-06: 10 mL via INTRAVENOUS

## 2019-12-06 MED ORDER — HEPARIN SOD (PORK) LOCK FLUSH 100 UNIT/ML IV SOLN
500.0000 [IU] | Freq: Once | INTRAVENOUS | Status: AC
  Administered 2019-12-06: 500 [IU] via INTRAVENOUS

## 2019-12-06 NOTE — Telephone Encounter (Signed)
I called Marissa [name] to follow up on her status and discuss appointments, no answer initially. I called her contacts and spoke to Marissa [name] who tried to wake Marissa [name] up but informed me Marissa [name] was "responsive" but very tired. She did not come to the phone. I asked Marissa to try to get her ready and bring to appointments today, she agreed to try and would call back if she had issues.   Cira Rue, NP

## 2019-12-07 ENCOUNTER — Telehealth: Payer: Self-pay | Admitting: Radiation Therapy

## 2019-12-07 ENCOUNTER — Inpatient Hospital Stay: Admission: RE | Admit: 2019-12-07 | Payer: Medicaid Other | Source: Ambulatory Visit

## 2019-12-07 ENCOUNTER — Other Ambulatory Visit: Payer: Self-pay | Admitting: Internal Medicine

## 2019-12-07 MED ORDER — OXYCODONE HCL 20 MG PO TABS: 20.0000 mg | ORAL_TABLET | Freq: Four times a day (QID) | ORAL | 0 refills | Status: DC | PRN

## 2019-12-07 NOTE — Telephone Encounter (Signed)
I spoke with [name] about her missed brain MRI appointment. She admittedly did not look at her schedule and went to the wrong facility for the scan. The scan has been rescheduled to Friday 11/12, I shared the new appointment time and location with [name]. We also discussed the change in her treatment dates due to the delay in her scan. She does know to still come for her Med Onc appointment on 11/15, but the radiation start date has been moved to 11/17, pending she has the MRI on 11/12 as planned.   Mont Dutton R.T.(R)(T) Radiation Special Procedures Navigator

## 2019-12-07 NOTE — Progress Notes (Signed)
Refilled oxycodone QID prn for pain. Reviewed PDMP.

## 2019-12-08 ENCOUNTER — Ambulatory Visit: Payer: Medicaid Other | Admitting: Radiation Oncology

## 2019-12-09 ENCOUNTER — Ambulatory Visit: Payer: Medicaid Other | Admitting: Radiation Oncology

## 2019-12-09 ENCOUNTER — Ambulatory Visit: Payer: Medicaid Other | Admitting: Hematology

## 2019-12-09 ENCOUNTER — Ambulatory Visit
Admission: RE | Admit: 2019-12-09 | Discharge: 2019-12-09 | Disposition: A | Discharge status: Home / Self Care | Payer: Medicaid Other | Source: Ambulatory Visit | Attending: Radiation Oncology | Admitting: Radiation Oncology

## 2019-12-09 ENCOUNTER — Other Ambulatory Visit: Payer: Medicaid Other

## 2019-12-09 DIAGNOSIS — C7931 Secondary malignant neoplasm of brain: Secondary | ICD-10-CM

## 2019-12-09 MED ORDER — GADOBENATE DIMEGLUMINE 529 MG/ML IV SOLN
14.0000 mL | Freq: Once | INTRAVENOUS | Status: AC | PRN
  Administered 2019-12-09: 14 mL via INTRAVENOUS

## 2019-12-11 NOTE — Progress Notes (Deleted)
[place]   Telephone:(336) [phone] Fax:(336) [phone]   Clinic Follow up Note   Patient Care Team: [name], MD as PCP - General (Hematology) 12/11/2019  CHIEF COMPLAINT: F/u metastatic small cell lung cancer   SUMMARY OF ONCOLOGIC HISTORY: Oncology History Overview Note  Cancer Staging Small cell lung cancer, left upper lobe ([place]) Staging form: Lung, AJCC 8th Edition - Clinical stage from 08/24/2018: Stage IIIA (cT1a, cN2, cM0) - Signed by [name], MD on 10/17/2018    Small cell lung cancer, left upper lobe ([place])  07/29/2018 Imaging   CT Angio Chest 07/29/18  IMPRESSION: 1. Malignant appearing left mediastinal and hilar lymphadenopathy narrowing the airways at the left hilum. In the left lung only a subtle 9 mm spiculated nodule is identified in the lingula. Consider small cell carcinoma. Bronchoscopy or mediastinal node sampling might be most valuable for diagnosis. 2. Indeterminate although low-density (which typically which indicates a benign adenoma) left adrenal nodule. 3.  Negative for acute pulmonary embolus.   08/24/2018 Definitive Surgery   Bronchoscopy and EBUS biopsy on 08/24/18 by Dr. Loanne Drilling   08/24/2018 Initial Biopsy   Diagnosis 08/24/18 FINE NEEDLE ASPIRATION, ENDOSCOPIC, (EBUS) STATION # 7 LYMPH NODE(SPECIMEN 1 OF 4 COLLECTED 08/24/18): NO MALIGNANT CELLS IDENTIFIED.  FINE NEEDLE ASPIRATION, ENDOSCOPIC, (EBUS) LEFT HILAR LYMPH NODE(SPECIMEN 2 OF 4 COLLECTED 08/24/18): NO MALIGNANT CELLS IDENTIFIED.  TRANSBRONCHIAL NEEDLE ASPIRATION, WANG, (SPECIMEN 3 OF 4 COLLECTED 08/24/18): NO MALIGNANT CELLS IDENTIFIED.  BRONCHIAL BRUSHING, LUL (SPECIMEN 4 OF 4 COLLECTED 08/24/18): MALIGNANT CELLS PRESENT, MOST CONSISTENT WITH SMALL CELL CARCINOMA. SEE COMMENT.   08/24/2018 Cancer Staging   Staging form: Lung, AJCC 8th Edition - Clinical stage from 08/24/2018: Stage IIIA (cT1a, cN2, cM0) - Signed by [name], MD on 10/17/2018   08/26/2018 Initial  Diagnosis   Small cell lung cancer, left ([place])   08/26/2018 Imaging   CT AP W Contrast 08/26/18  IMPRESSION: 1. 2.9 cm left adrenal nodule cannot be definitively characterized on this study. Potentially a lipid poor adenoma, but metastatic disease not excluded. MRI of the abdomen with and without contrast may prove helpful to further evaluate. 2. No other findings to suggest metastatic disease in the abdomen/pelvis. 3. Left base atelectasis with small left pleural effusion.   08/27/2018 Imaging   MRI brain 08/27/18  IMPRESSION: 1. Motion degraded, incomplete examination. 2. Subcentimeter focus of diffusion signal abnormality in the left cerebellum. No enhancement is evident to strongly suggest a metastasis, and this may reflect an acute to subacute small vessel infarct. 3. No evidence of intracranial metastatic disease elsewhere. 4. Given motion artifact and incomplete postcontrast imaging, consider short-term follow-up MRI (possibly with sedation) to further evaluate the left cerebellar abnormality and provide a more thorough evaluation for metastatic disease.   09/02/2018 PET scan   IMPRESSION: 1. Hypermetabolic left hilar mass is identified encasing the left mainstem bronchus resulting in postobstructive atelectasis in the left upper lobe. 2. Hypermetabolic left mediastinal nodal metastasis. 3. Loculated left pleural effusion is identified with mild increased FDG uptake. Cannot rule out malignant pleural effusion. 4. No evidence for metastatic disease to the abdomen or pelvis or skeletal structures.     09/07/2018 - 11/10/2018 Chemotherapy   Concurrent chemoRT with IV cisplatin on day 1 and etopside day 1-3 for every 3 weeks starting 8/11. Will start radiation with Dr. Lisbeth Renshaw with cycle 2. She completed 4 cycles on 11/10/18   09/14/2018 Procedure   Thoracentesis yielding 750 mL   09/28/2018 - 12/09/2019 Radiation Therapy  Concurrent chemoRT with Dr. Lisbeth Renshaw. Start with cycle 2  chemo on 09/28/18 and completed on 12/09/18.    02/10/2019 Imaging   CT CAP W Contrast  IMPRESSION: 1. Interval response to therapy. Significant interval reduction in tumor burden involving the left hemithorax. 2. No new or progressive disease identified within the chest, abdomen or pelvis. 3. Emphysema and aortic atherosclerosis. 4. Stable left adrenal nodule.   Aortic Atherosclerosis (ICD10-I70.0) and Emphysema (ICD10-J43.9).   02/11/2019 Imaging   MRI Brain  IMPRESSION: 6 x 10 mm solitary metastatic deposit right frontal lobe with minimal edema.   02/28/2019 - 03/14/2019 Radiation Therapy   Target Brain Radiation with Dr. Lisbeth Renshaw    05/19/2019 Imaging   CT CAP w contrast  IMPRESSION: 1. Trace residual soft tissue thickening at the left lung apex and along the AP window. Slight increase in size of a low left paratracheal lymph node. Continued attention on follow-up exams is warranted. No evidence of distant metastatic disease. 2. Subpleural density in the posterior left upper lobe is new and may be due to atelectasis. Recommend attention on follow-up. 3. Left adrenal adenoma. 4. Aortic atherosclerosis (ICD10-I70.0). Coronary artery calcification. 5.  Emphysema (ICD10-J43.9).   07/13/2019 Imaging   MRI brain  IMPRESSION: 1. Increased size of right frontal lobe metastasis with new moderate edema. 2. No evidence of new intracranial metastases.     08/10/2019 Imaging   CT CAP W Contrast  IMPRESSION: 1. New and progressive adenopathy within the mediastinum and low left neck, consistent with nodal metastasis. 2. No evidence of pulmonary metastasis. Similar areas of probable scarring and pleural thickening. 3. No subdiaphragmatic metastatic disease identified. 4. Left adrenal adenoma. 5. Age advanced aortic atherosclerosis (ICD10-I70.0) and emphysema (ICD10-J43.9).     08/19/2019 Imaging   Ct Angio Chest  IMPRESSION: 1. No evidence of pulmonary embolus. 2. Stable nodal  metastasis within the mediastinum and left internal jugular chain. 3. Interval development of mild interlobular septal thickening and ground-glass opacity, greatest in the right upper lobe and left lower lobe. Differential includes asymmetric edema versus atypical infection. 4. Aortic Atherosclerosis (ICD10-I70.0) and Emphysema (ICD10-J43.9).     08/25/2019 Imaging   MRI Brain  IMPRESSION: Increased size of centrally necrotic enhancing right frontal lesion measuring 4.0 cm, previously 2.6 cm.   White matter edema and dural thickening/enhancement involving the right frontal convexity is more conspicuous than prior exam.   No new enhancing lesion.   09/01/2019 Procedure   PAC placement    09/19/2019 Imaging   MRI brain  IMPRESSION: 1. Postoperative changes from interval right frontal craniotomy for resection of previously identified right frontal lobe mass. Somewhat ill-defined parenchymal enhancement about the resection cavity favored to reflect postoperative changes. No definite residual tumor seen on this immediate postoperative scan. 2. Small 1.2 cm infarct at the deep margin of the resection cavity. 3. Interval increase in T2/FLAIR signal intensity involving the adjacent right frontal region with increased regional mass effect and up to 6 mm of right-to-left shift.   09/19/2019 Surgery   Right CRANIOTOMY TUMOR EXCISION, FRONTAL and APPLICATION OF CRANIAL NAVIGATION by Dr Kathyrn Sheriff   09/19/2019 Pathology Results   FINAL MICROSCOPIC DIAGNOSIS:   A. BRAIN TUMOR, RIGHT FRONTAL, EXCISION:  - Small cell carcinoma.   B. BRAIN TUMOR, RIGHT FRONTAL DURA, EXCISION:  - Small cell carcinoma.    COMMENT:   CK8/18 (dot-like), Synaptophysin and Chromogranin are positive.  CKAE1AE3, CK7 and Chromogranin are noncontributory.  Ki-67 proliferation  index reaches 70-80%.  The morphology and immunophenotype  are compatible  with the provided clinical history of small cell carcinoma.  Dr.  Melina Copa  reviewed.    10/27/2019 Imaging   IMPRESSION: 1. Significant interval enlargement of bulky mediastinal and left hilar lymphadenopathy. There are new pleural nodules or prevascular lymph nodes. 2. Significant interval increase in masslike consolidation about the left hilum. There is interlobular septal thickening and scattered ground-glass and heterogeneous airspace opacity throughout the left lung, predominantly involving the left upper lobe and concerning for lymphangitic spread and postobstructive airspace disease. 3. There is somewhat nodular pleural thickening about the medial left upper lobe and a new small left pleural effusion. 4. Findings are consistent with worsened lung malignancy with pleural and nodal metastatic disease. 5. No significant interval change in a left adrenal mass, likely an incidental adenoma. Attention on follow-up. 6. No definite evidence of distant metastatic disease within the abdomen or pelvis. 7. Emphysema (ICD10-J43.9). 8. Aortic Atherosclerosis (ICD10-I70.0)   11/07/2019 -  Chemotherapy   First-line chemo Carboplatin day 1, Etopside day 1-3 with immunotherapy Tecentriq day 1 q3weeks starting 11/07/19   11/18/2019 Imaging   MRI Brain  IMPRESSION: 1. New heterogenously enhancing right anterior and middle cranial fossa tumor measuring 6.7 x 5.2 x 6.2 cm. 2. Increased leftward midline shift of 0.9 cm with leftward displacement of the ACAs. The ACAs remain patent. 3. New mild mass effect upon the right midbrain. 4. Confluent T2/FLAIR hyperintense right cerebrum signal is increased since prior exam. 5. Multifocal dehiscence involving the right anterior cranial fossa floor with extension of tumor into the superior ethmoid and superior right orbit.     CURRENT THERAPY: First-line chemo Carboplatin, Etopside day 1-3 with immunotherapy Tecentriq q3weeks starting 11/07/19  INTERVAL HISTORY: Marissa [name] returns for f/u as scheduled. She  completed cycle 1 on 10/11 - 13, missed subsequent appointments until she was seen 10/20 for worsening headaches. Brain MRI showed progression of brain met. She is scheduled for SRS this week.    REVIEW OF SYSTEMS:   Constitutional: Denies fevers, chills or abnormal weight loss Eyes: Denies blurriness of vision Ears, nose, mouth, throat, and face: Denies mucositis or sore throat Respiratory: Denies cough, dyspnea or wheezes Cardiovascular: Denies palpitation, chest discomfort or lower extremity swelling Gastrointestinal:  Denies nausea, heartburn or change in bowel habits Skin: Denies abnormal skin rashes Lymphatics: Denies new lymphadenopathy or easy bruising Neurological:Denies numbness, tingling or new weaknesses Behavioral/Psych: Mood is stable, no new changes  All other systems were reviewed with the patient and are negative.  MEDICAL HISTORY:  Past Medical History:  Diagnosis Date  . Asthma   . Chronic headaches   . COPD (chronic obstructive pulmonary disease) ([place])   . Depression   . Dyspnea    hospitalized July 2020  . Heart murmur   . Small cell lung cancer ([place])     SURGICAL HISTORY: Past Surgical History:  Procedure Laterality Date  . APPLICATION OF CRANIAL NAVIGATION Right 09/19/2019   Procedure: APPLICATION OF CRANIAL NAVIGATION;  Surgeon: Consuella Lose, MD;  Location: [facility];  Service: Neurosurgery;  Laterality: Right;  APPLICATION OF CRANIAL NAVIGATION  . BRONCHIAL BIOPSY  08/24/2018   Procedure: BRONCHIAL BIOPSIES;  Surgeon: Margaretha Seeds, MD;  Location: [facility];  Service: Cardiopulmonary;;  . BRONCHIAL BRUSHINGS  08/24/2018   Procedure: BRONCHIAL BRUSHINGS;  Surgeon: Margaretha Seeds, MD;  Location: [facility];  Service: Cardiopulmonary;;  . BRONCHIAL NEEDLE ASPIRATION BIOPSY  08/24/2018   Procedure: BRONCHIAL NEEDLE ASPIRATION BIOPSIES;  Surgeon: Margaretha Seeds, MD;  Location:  [facility];  Service: Cardiopulmonary;;  . CRANIOTOMY Right  09/19/2019   Procedure: CRANIOTOMY TUMOR EXCISION, FRONTAL;  Surgeon: Consuella Lose, MD;  Location: [facility];  Service: Neurosurgery;  Laterality: Right;  CRANIOTOMY TUMOR EXCISION, FRONTAL  . ENDOBRONCHIAL ULTRASOUND N/A 08/24/2018   Procedure: ENDOBRONCHIAL ULTRASOUND;  Surgeon: Margaretha Seeds, MD;  Location: [facility];  Service: Cardiopulmonary;  Laterality: N/A;  . IR IMAGING GUIDED PORT INSERTION  09/01/2019  . IR THORACENTESIS ASP PLEURAL SPACE W/IMG GUIDE  09/14/2018  . TUBAL LIGATION    . VIDEO BRONCHOSCOPY  08/24/2018   Procedure: VIDEO BRONCHOSCOPY;  Surgeon: Margaretha Seeds, MD;  Location: [facility];  Service: Cardiopulmonary;;    I have reviewed the social history and family history with the patient and they are unchanged from previous note.  ALLERGIES:  is allergic to penicillins.  MEDICATIONS:  Current Outpatient Medications  Medication Sig Dispense Refill  . acetaminophen (TYLENOL) 325 MG tablet Take 1,300 mg by mouth every 6 (six) hours as needed for mild pain or moderate pain. (Patient not taking: Reported on 11/29/2019)    . baclofen (LIORESAL) 10 MG tablet Take 1 tablet (10 mg total) by mouth 3 (three) times daily as needed for muscle spasms. (Patient not taking: Reported on 11/29/2019) 30 each 0  . dexamethasone (DECADRON) 4 MG tablet Take 1 tablet (4 mg total) by mouth 2 (two) times daily. 60 tablet 3  . diazepam (VALIUM) 10 MG tablet Take 2 tablets (20 mg total) by mouth See admin instructions. Take 1-2 tablets by mouth 30 minutes prior to MRI or radiation (Patient not taking: Reported on 11/29/2019) 3 tablet 0  . dronabinol (MARINOL) 5 MG capsule Take 1 capsule (5 mg total) by mouth 2 (two) times daily before a meal. 60 capsule 0  . DULoxetine (CYMBALTA) 20 MG capsule Take 2 capsules (40 mg total) by mouth daily. Take 1 capsule by mouth daily for the first week, then increase to 2 caps daily 60 capsule 1  . gabapentin (NEURONTIN) 300 MG capsule Take 1 tab in morning  and 2 tabs at bedtime 90 capsule 2  . lidocaine (LIDODERM) 5 % Place 1 patch onto the skin daily. 20 patch 1  . lidocaine-prilocaine (EMLA) cream Apply to affected area once 30 g 3  . LORazepam (ATIVAN) 1 MG tablet Take 1 tablet (1 mg total) by mouth every 12 (twelve) hours as needed for anxiety. 30 tablet 0  . ondansetron (ZOFRAN) 8 MG tablet Take 1 tablet (8 mg total) by mouth 2 (two) times daily as needed for refractory nausea / vomiting. Start on day 3 after carboplatin chemo. 30 tablet 1  . Oxycodone HCl 20 MG TABS Take 1 tablet (20 mg total) by mouth 4 (four) times daily as needed. 120 tablet 0  . prochlorperazine (COMPAZINE) 10 MG tablet Take 1 tablet (10 mg total) by mouth every 6 (six) hours as needed (Nausea or vomiting). 30 tablet 1   No current facility-administered medications for this visit.    PHYSICAL EXAMINATION: ECOG PERFORMANCE STATUS: {CHL ONC ECOG PS:[phone]}  There were no vitals filed for this visit. There were no vitals filed for this visit.  GENERAL:alert, no distress and comfortable SKIN: skin color, texture, turgor are normal, no rashes or significant lesions EYES: normal, Conjunctiva are pink and non-injected, sclera clear OROPHARYNX:no exudate, no erythema and lips, buccal mucosa, and tongue normal  NECK: supple, thyroid normal size, non-tender, without nodularity LYMPH:  no palpable lymphadenopathy in the cervical, axillary or inguinal  LUNGS: clear to auscultation and percussion with normal breathing effort HEART: regular rate & rhythm and no murmurs and no lower extremity edema ABDOMEN:abdomen soft, non-tender and normal bowel sounds Musculoskeletal:no cyanosis of digits and no clubbing  NEURO: alert & oriented x 3 with fluent speech, no focal motor/sensory deficits  LABORATORY DATA:  I have reviewed the data as listed CBC Latest Ref Rng & Units 11/16/2019 11/07/2019 09/19/2019  WBC 4.0 - 10.5 K/uL 4.0 9.2 7.0  Hemoglobin 12.0 - 15.0 g/dL 10.9(L)  11.7(L) 13.2  Hematocrit 36 - 46 % 34.9(L) 38.6 42.0  Platelets 150 - 400 K/uL 240 446(H) 305     CMP Latest Ref Rng & Units 11/16/2019 11/07/2019 09/19/2019  Glucose 70 - 99 mg/dL 122(H) 91 118(H)  BUN 6 - 20 mg/dL 25(H) 16 31(H)  Creatinine 0.44 - 1.00 mg/dL 0.91 0.77 0.72  Sodium 135 - 145 mmol/L 142 141 143  Potassium 3.5 - 5.1 mmol/L 3.5 4.0 3.4(L)  Chloride 98 - 111 mmol/L 106 108 108  CO2 22 - 32 mmol/L _0 Calcium 8.9 - 10.3 mg/dL 10.0 9.5 10.0  Total Protein 6.5 - 8.1 g/dL 7.4 7.2 -  Total Bilirubin 0.3 - 1.2 mg/dL 0.3 <0.2(L) -  Alkaline Phos 38 - 126 U/L 90 87 -  AST 15 - 41 U/L 23 16 -  ALT 0 - 44 U/L 40 13 -      RADIOGRAPHIC STUDIES: I have personally reviewed the radiological images as listed and agreed with the findings in the report. MR Brain W Wo Contrast  Result Date: 12/09/2019 CLINICAL DATA:  54 year old female with metastatic small cell lung cancer. Whole brain radiation completed in February. Systemic progression in September. Progressive right frontal lobe lesion with edema this summer, status post craniotomy and resection in August, pathology revealing small cell carcinoma. And then substantial progression of disease in the anterior right hemisphere last month. EXAM: MRI HEAD WITHOUT AND WITH CONTRAST TECHNIQUE: Multiplanar, multiecho pulse sequences of the brain and surrounding structures were obtained without and with intravenous contrast. CONTRAST:  67m MULTIHANCE GADOBENATE DIMEGLUMINE 529 MG/ML IV SOLN COMPARISON:  Brain MRI 11/18/2019 and earlier. FINDINGS: Brain: Bulky and lobulated, confluent enhancing tumor from the right anterior temporal tip through to the anterior inferior right frontal gyrus encompassing an area of 71 x 49 x 51 mm (AP by transverse by CC). Compared to last month and measured in the same way tumor size and configuration has not significantly changed (71 x 45 x 53 mm). Enhancement pattern is stable allowing for differences in  technique. Tumor extends to the surface of the brain as before, but without additional superimposed meningeal thickening or enhancement. Extensive T2 and FLAIR hyperintensity throughout the anterior right frontal lobe, tracking through the deep white matter capsules into the right temporal lobe most likely is confluent vasogenic edema and not significantly changed. Intracranial mass effect with leftward midline shift has mildly improved to 5 to 6 mm (previously up to 9 mm). Basilar cisterns are normal. No ventriculomegaly. No other abnormal intracranial enhancement identified. No superimposed restricted diffusion suggestive of acute infarction. Noextra-axial fluid collection or acute intracranial hemorrhage. Cervicomedullary junction and pituitary are within normal limits. Stable gray and white matter signal outside of the right hemisphere. Vascular: Major intracranial vascular flow voids are stable. Skull and upper cervical spine: Negative visible cervical spine. Sequelae of anterior right frontal craniotomy. No superimposed bony metastatic disease identified. Sinuses/Orbits: Optic chiasm and orbits soft tissues remain within normal limits. Paranasal  Visualized paranasal sinuses and mastoids are stable and well pneumatized. Other: Visible internal auditory structures appear normal. Stable scalp. IMPRESSION: 1. Bulky, enhancing tumor in the anterior right hemisphere has not significantly changed since 11/18/2019. Stable extensive vasogenic edema. But intracranial mass effect and midline shift (now 5-6 mm) have improved. 2. No new metastatic disease or new intracranial abnormality identified. Electronically Signed   By: Genevie Ann M.D.   On: 12/09/2019 23:46     ASSESSMENT & PLAN:  No problem-specific Assessment & Plan notes found for this encounter.   No orders of the defined types were placed in this encounter.  All questions were answered. The patient knows to call the clinic with any problems, questions or  concerns. No barriers to learning was detected. I spent {CHL ONC TIME VISIT - PQDIY:[id number]} counseling the patient face to face. The total time spent in the appointment was {CHL ONC TIME VISIT - HWKGS:[id number]} and more than 50% was on counseling and review of test results     Marissa Feeling, NP 12/11/19

## 2019-12-12 ENCOUNTER — Ambulatory Visit: Payer: Medicaid Other | Admitting: Radiation Oncology

## 2019-12-12 ENCOUNTER — Inpatient Hospital Stay: Payer: Medicaid Other

## 2019-12-12 ENCOUNTER — Inpatient Hospital Stay: Payer: Medicaid Other | Admitting: Nurse Practitioner

## 2019-12-13 ENCOUNTER — Ambulatory Visit: Payer: Medicaid Other | Admitting: Radiation Oncology

## 2019-12-13 DIAGNOSIS — C7931 Secondary malignant neoplasm of brain: Secondary | ICD-10-CM | POA: Diagnosis not present

## 2019-12-13 NOTE — Progress Notes (Deleted)
[place]   [phone]) [phone] Fax:([phone]) [phone]   Clinic Follow up Note   Patient Care Team: [name], MD as PCP - General (Hematology) 12/13/2019  CHIEF COMPLAINT: F/u metastatic SCLC   SUMMARY OF ONCOLOGIC HISTORY: Oncology History Overview Note  Cancer Staging Small cell lung cancer, left upper lobe ([place]) Staging form: Lung, AJCC 8th Edition - Clinical stage from 08/24/2018: Stage IIIA (cT1a, cN2, cM0) - Signed by [name], MD on 10/17/2018    Small cell lung cancer, left upper lobe ([place])  07/29/2018 Imaging   CT Angio Chest 07/29/18  IMPRESSION: 1. Malignant appearing left mediastinal and hilar lymphadenopathy narrowing the airways at the left hilum. In the left lung only a subtle 9 mm spiculated nodule is identified in the lingula. Consider small cell carcinoma. Bronchoscopy or mediastinal node sampling might be most valuable for diagnosis. 2. Indeterminate although low-density (which typically which indicates a benign adenoma) left adrenal nodule. 3.  Negative for acute pulmonary embolus.   08/24/2018 Definitive Surgery   Bronchoscopy and EBUS biopsy on 08/24/18 by Dr. Loanne Drilling   08/24/2018 Initial Biopsy   Diagnosis 08/24/18 FINE NEEDLE ASPIRATION, ENDOSCOPIC, (EBUS) STATION # 7 LYMPH NODE(SPECIMEN 1 OF 4 COLLECTED 08/24/18): NO MALIGNANT CELLS IDENTIFIED.  FINE NEEDLE ASPIRATION, ENDOSCOPIC, (EBUS) LEFT HILAR LYMPH NODE(SPECIMEN 2 OF 4 COLLECTED 08/24/18): NO MALIGNANT CELLS IDENTIFIED.  TRANSBRONCHIAL NEEDLE ASPIRATION, WANG, (SPECIMEN 3 OF 4 COLLECTED 08/24/18): NO MALIGNANT CELLS IDENTIFIED.  BRONCHIAL BRUSHING, LUL (SPECIMEN 4 OF 4 COLLECTED 08/24/18): MALIGNANT CELLS PRESENT, MOST CONSISTENT WITH SMALL CELL CARCINOMA. SEE COMMENT.   08/24/2018 Cancer Staging   Staging form: Lung, AJCC 8th Edition - Clinical stage from 08/24/2018: Stage IIIA (cT1a, cN2, cM0) - Signed by [name], MD on 10/17/2018   08/26/2018 Initial Diagnosis   Small cell  lung cancer, left ([place])   08/26/2018 Imaging   CT AP W Contrast 08/26/18  IMPRESSION: 1. 2.9 cm left adrenal nodule cannot be definitively characterized on this study. Potentially a lipid poor adenoma, but metastatic disease not excluded. MRI of the abdomen with and without contrast may prove helpful to further evaluate. 2. No other findings to suggest metastatic disease in the abdomen/pelvis. 3. Left base atelectasis with small left pleural effusion.   08/27/2018 Imaging   MRI brain 08/27/18  IMPRESSION: 1. Motion degraded, incomplete examination. 2. Subcentimeter focus of diffusion signal abnormality in the left cerebellum. No enhancement is evident to strongly suggest a metastasis, and this may reflect an acute to subacute small vessel infarct. 3. No evidence of intracranial metastatic disease elsewhere. 4. Given motion artifact and incomplete postcontrast imaging, consider short-term follow-up MRI (possibly with sedation) to further evaluate the left cerebellar abnormality and provide a more thorough evaluation for metastatic disease.   09/02/2018 PET scan   IMPRESSION: 1. Hypermetabolic left hilar mass is identified encasing the left mainstem bronchus resulting in postobstructive atelectasis in the left upper lobe. 2. Hypermetabolic left mediastinal nodal metastasis. 3. Loculated left pleural effusion is identified with mild increased FDG uptake. Cannot rule out malignant pleural effusion. 4. No evidence for metastatic disease to the abdomen or pelvis or skeletal structures.     09/07/2018 - 11/10/2018 Chemotherapy   Concurrent chemoRT with IV cisplatin on day 1 and etopside day 1-3 for every 3 weeks starting 8/11. Will start radiation with Dr. Lisbeth Renshaw with cycle 2. She completed 4 cycles on 11/10/18   09/14/2018 Procedure   Thoracentesis yielding 750 mL   09/28/2018 - 12/09/2019 Radiation Therapy   Concurrent  chemoRT with Dr. Lisbeth Renshaw. Start with cycle 2 chemo on 09/28/18 and  completed on 12/09/18.    02/10/2019 Imaging   CT CAP W Contrast  IMPRESSION: 1. Interval response to therapy. Significant interval reduction in tumor burden involving the left hemithorax. 2. No new or progressive disease identified within the chest, abdomen or pelvis. 3. Emphysema and aortic atherosclerosis. 4. Stable left adrenal nodule.   Aortic Atherosclerosis (ICD10-I70.0) and Emphysema (ICD10-J43.9).   02/11/2019 Imaging   MRI Brain  IMPRESSION: 6 x 10 mm solitary metastatic deposit right frontal lobe with minimal edema.   02/28/2019 - 03/14/2019 Radiation Therapy   Target Brain Radiation with Dr. Lisbeth Renshaw    05/19/2019 Imaging   CT CAP w contrast  IMPRESSION: 1. Trace residual soft tissue thickening at the left lung apex and along the AP window. Slight increase in size of a low left paratracheal lymph node. Continued attention on follow-up exams is warranted. No evidence of distant metastatic disease. 2. Subpleural density in the posterior left upper lobe is new and may be due to atelectasis. Recommend attention on follow-up. 3. Left adrenal adenoma. 4. Aortic atherosclerosis (ICD10-I70.0). Coronary artery calcification. 5.  Emphysema (ICD10-J43.9).   07/13/2019 Imaging   MRI brain  IMPRESSION: 1. Increased size of right frontal lobe metastasis with new moderate edema. 2. No evidence of new intracranial metastases.     08/10/2019 Imaging   CT CAP W Contrast  IMPRESSION: 1. New and progressive adenopathy within the mediastinum and low left neck, consistent with nodal metastasis. 2. No evidence of pulmonary metastasis. Similar areas of probable scarring and pleural thickening. 3. No subdiaphragmatic metastatic disease identified. 4. Left adrenal adenoma. 5. Age advanced aortic atherosclerosis (ICD10-I70.0) and emphysema (ICD10-J43.9).     08/19/2019 Imaging   Ct Angio Chest  IMPRESSION: 1. No evidence of pulmonary embolus. 2. Stable nodal metastasis within the  mediastinum and left internal jugular chain. 3. Interval development of mild interlobular septal thickening and ground-glass opacity, greatest in the right upper lobe and left lower lobe. Differential includes asymmetric edema versus atypical infection. 4. Aortic Atherosclerosis (ICD10-I70.0) and Emphysema (ICD10-J43.9).     08/25/2019 Imaging   MRI Brain  IMPRESSION: Increased size of centrally necrotic enhancing right frontal lesion measuring 4.0 cm, previously 2.6 cm.   White matter edema and dural thickening/enhancement involving the right frontal convexity is more conspicuous than prior exam.   No new enhancing lesion.   09/01/2019 Procedure   PAC placement    09/19/2019 Imaging   MRI brain  IMPRESSION: 1. Postoperative changes from interval right frontal craniotomy for resection of previously identified right frontal lobe mass. Somewhat ill-defined parenchymal enhancement about the resection cavity favored to reflect postoperative changes. No definite residual tumor seen on this immediate postoperative scan. 2. Small 1.2 cm infarct at the deep margin of the resection cavity. 3. Interval increase in T2/FLAIR signal intensity involving the adjacent right frontal region with increased regional mass effect and up to 6 mm of right-to-left shift.   09/19/2019 Surgery   Right CRANIOTOMY TUMOR EXCISION, FRONTAL and APPLICATION OF CRANIAL NAVIGATION by Dr Kathyrn Sheriff   09/19/2019 Pathology Results   FINAL MICROSCOPIC DIAGNOSIS:   A. BRAIN TUMOR, RIGHT FRONTAL, EXCISION:  - Small cell carcinoma.   B. BRAIN TUMOR, RIGHT FRONTAL DURA, EXCISION:  - Small cell carcinoma.    COMMENT:   CK8/18 (dot-like), Synaptophysin and Chromogranin are positive.  CKAE1AE3, CK7 and Chromogranin are noncontributory.  Ki-67 proliferation  index reaches 70-80%.  The morphology and immunophenotype are  compatible  with the provided clinical history of small cell carcinoma.  Dr. Melina Copa  reviewed.     10/27/2019 Imaging   IMPRESSION: 1. Significant interval enlargement of bulky mediastinal and left hilar lymphadenopathy. There are new pleural nodules or prevascular lymph nodes. 2. Significant interval increase in masslike consolidation about the left hilum. There is interlobular septal thickening and scattered ground-glass and heterogeneous airspace opacity throughout the left lung, predominantly involving the left upper lobe and concerning for lymphangitic spread and postobstructive airspace disease. 3. There is somewhat nodular pleural thickening about the medial left upper lobe and a new small left pleural effusion. 4. Findings are consistent with worsened lung malignancy with pleural and nodal metastatic disease. 5. No significant interval change in a left adrenal mass, likely an incidental adenoma. Attention on follow-up. 6. No definite evidence of distant metastatic disease within the abdomen or pelvis. 7. Emphysema (ICD10-J43.9). 8. Aortic Atherosclerosis (ICD10-I70.0)   11/07/2019 -  Chemotherapy   First-line chemo Carboplatin day 1, Etopside day 1-3 with immunotherapy Tecentriq day 1 q3weeks starting 11/07/19   11/18/2019 Imaging   MRI Brain  IMPRESSION: 1. New heterogenously enhancing right anterior and middle cranial fossa tumor measuring 6.7 x 5.2 x 6.2 cm. 2. Increased leftward midline shift of 0.9 cm with leftward displacement of the ACAs. The ACAs remain patent. 3. New mild mass effect upon the right midbrain. 4. Confluent T2/FLAIR hyperintense right cerebrum signal is increased since prior exam. 5. Multifocal dehiscence involving the right anterior cranial fossa floor with extension of tumor into the superior ethmoid and superior right orbit.     CURRENT THERAPY:  First-line chemo Carboplatin, Etopside day 1-3 with immunotherapy Tecentriq q3weeks starting 11/07/19, only received 1 cycle  INTERVAL HISTORY: Ms. Barrales returns for rescheduled f/u prior to  starting [place].    REVIEW OF SYSTEMS:   Constitutional: Denies fevers, chills or abnormal weight loss Eyes: Denies blurriness of vision Ears, nose, mouth, throat, and face: Denies mucositis or sore throat Respiratory: Denies cough, dyspnea or wheezes Cardiovascular: Denies palpitation, chest discomfort or lower extremity swelling Gastrointestinal:  Denies nausea, heartburn or change in bowel habits Skin: Denies abnormal skin rashes Lymphatics: Denies new lymphadenopathy or easy bruising Neurological:Denies numbness, tingling or new weaknesses Behavioral/Psych: Mood is stable, no new changes  All other systems were reviewed with the patient and are negative.  MEDICAL HISTORY:  Past Medical History:  Diagnosis Date  . Asthma   . Chronic headaches   . COPD (chronic obstructive pulmonary disease) ([place])   . Depression   . Dyspnea    hospitalized July 2020  . Heart murmur   . Small cell lung cancer ([place])     SURGICAL HISTORY: Past Surgical History:  Procedure Laterality Date  . APPLICATION OF CRANIAL NAVIGATION Right 09/19/2019   Procedure: APPLICATION OF CRANIAL NAVIGATION;  Surgeon: Consuella Lose, MD;  Location: [facility];  Service: Neurosurgery;  Laterality: Right;  APPLICATION OF CRANIAL NAVIGATION  . BRONCHIAL BIOPSY  08/24/2018   Procedure: BRONCHIAL BIOPSIES;  Surgeon: Margaretha Seeds, MD;  Location: [facility];  Service: Cardiopulmonary;;  . BRONCHIAL BRUSHINGS  08/24/2018   Procedure: BRONCHIAL BRUSHINGS;  Surgeon: Margaretha Seeds, MD;  Location: [facility];  Service: Cardiopulmonary;;  . BRONCHIAL NEEDLE ASPIRATION BIOPSY  08/24/2018   Procedure: BRONCHIAL NEEDLE ASPIRATION BIOPSIES;  Surgeon: Margaretha Seeds, MD;  Location: [facility];  Service: Cardiopulmonary;;  . CRANIOTOMY Right 09/19/2019   Procedure: CRANIOTOMY TUMOR EXCISION, FRONTAL;  Surgeon: Consuella Lose, MD;  Location: [facility];  Service: Neurosurgery;  Laterality: Right;  CRANIOTOMY TUMOR EXCISION,  FRONTAL  . ENDOBRONCHIAL ULTRASOUND N/A 08/24/2018   Procedure: ENDOBRONCHIAL ULTRASOUND;  Surgeon: Margaretha Seeds, MD;  Location: [facility];  Service: Cardiopulmonary;  Laterality: N/A;  . IR IMAGING GUIDED PORT INSERTION  09/01/2019  . IR THORACENTESIS ASP PLEURAL SPACE W/IMG GUIDE  09/14/2018  . TUBAL LIGATION    . VIDEO BRONCHOSCOPY  08/24/2018   Procedure: VIDEO BRONCHOSCOPY;  Surgeon: Margaretha Seeds, MD;  Location: [facility];  Service: Cardiopulmonary;;    I have reviewed the social history and family history with the patient and they are unchanged from previous note.  ALLERGIES:  is allergic to penicillins.  MEDICATIONS:  Current Outpatient Medications  Medication Sig Dispense Refill  . acetaminophen (TYLENOL) 325 MG tablet Take 1,300 mg by mouth every 6 (six) hours as needed for mild pain or moderate pain. (Patient not taking: Reported on 11/29/2019)    . baclofen (LIORESAL) 10 MG tablet Take 1 tablet (10 mg total) by mouth 3 (three) times daily as needed for muscle spasms. (Patient not taking: Reported on 11/29/2019) 30 each 0  . dexamethasone (DECADRON) 4 MG tablet Take 1 tablet (4 mg total) by mouth 2 (two) times daily. 60 tablet 3  . diazepam (VALIUM) 10 MG tablet Take 2 tablets (20 mg total) by mouth See admin instructions. Take 1-2 tablets by mouth 30 minutes prior to MRI or radiation (Patient not taking: Reported on 11/29/2019) 3 tablet 0  . dronabinol (MARINOL) 5 MG capsule Take 1 capsule (5 mg total) by mouth 2 (two) times daily before a meal. 60 capsule 0  . DULoxetine (CYMBALTA) 20 MG capsule Take 2 capsules (40 mg total) by mouth daily. Take 1 capsule by mouth daily for the first week, then increase to 2 caps daily 60 capsule 1  . gabapentin (NEURONTIN) 300 MG capsule Take 1 tab in morning and 2 tabs at bedtime 90 capsule 2  . lidocaine (LIDODERM) 5 % Place 1 patch onto the skin daily. 20 patch 1  . lidocaine-prilocaine (EMLA) cream Apply to affected area once 30 g 3   . LORazepam (ATIVAN) 1 MG tablet Take 1 tablet (1 mg total) by mouth every 12 (twelve) hours as needed for anxiety. 30 tablet 0  . ondansetron (ZOFRAN) 8 MG tablet Take 1 tablet (8 mg total) by mouth 2 (two) times daily as needed for refractory nausea / vomiting. Start on day 3 after carboplatin chemo. 30 tablet 1  . Oxycodone HCl 20 MG TABS Take 1 tablet (20 mg total) by mouth 4 (four) times daily as needed. 120 tablet 0  . prochlorperazine (COMPAZINE) 10 MG tablet Take 1 tablet (10 mg total) by mouth every 6 (six) hours as needed (Nausea or vomiting). 30 tablet 1   No current facility-administered medications for this visit.    PHYSICAL EXAMINATION: ECOG PERFORMANCE STATUS: {CHL ONC ECOG [phone]}  There were no vitals filed for this visit. There were no vitals filed for this visit.  GENERAL:alert, no distress and comfortable SKIN: skin color, texture, turgor are normal, no rashes or significant lesions EYES: normal, Conjunctiva are pink and non-injected, sclera clear OROPHARYNX:no exudate, no erythema and lips, buccal mucosa, and tongue normal  NECK: supple, thyroid normal size, non-tender, without nodularity LYMPH:  no palpable lymphadenopathy in the cervical, axillary or inguinal LUNGS: clear to auscultation and percussion with normal breathing effort HEART: regular rate & rhythm and no murmurs and no lower extremity edema ABDOMEN:abdomen soft, non-tender and  normal bowel sounds Musculoskeletal:no cyanosis of digits and no clubbing  NEURO: alert & oriented x 3 with fluent speech, no focal motor/sensory deficits  LABORATORY DATA:  I have reviewed the data as listed CBC Latest Ref Rng & Units 11/16/2019 11/07/2019 09/19/2019  WBC 4.0 - 10.5 K/uL 4.0 9.2 7.0  Hemoglobin 12.0 - 15.0 g/dL 10.9(L) 11.7(L) 13.2  Hematocrit 36 - 46 % 34.9(L) 38.6 42.0  Platelets 150 - 400 K/uL 240 446(H) 305     CMP Latest Ref Rng & Units 11/16/2019 11/07/2019 09/19/2019  Glucose 70 - 99 mg/dL  122(H) 91 118(H)  BUN 6 - 20 mg/dL 25(H) 16 31(H)  Creatinine 0.44 - 1.00 mg/dL 0.91 0.77 0.72  Sodium 135 - 145 mmol/L 142 141 143  Potassium 3.5 - 5.1 mmol/L 3.5 4.0 3.4(L)  Chloride 98 - 111 mmol/L 106 108 108  CO2 22 - 32 mmol/L '27 28 24  ' Calcium 8.9 - 10.3 mg/dL 10.0 9.5 10.0  Total Protein 6.5 - 8.1 g/dL 7.4 7.2 -  Total Bilirubin 0.3 - 1.2 mg/dL 0.3 <0.2(L) -  Alkaline Phos 38 - 126 U/L 90 87 -  AST 15 - 41 U/L 23 16 -  ALT 0 - 44 U/L 40 13 -      RADIOGRAPHIC STUDIES: I have personally reviewed the radiological images as listed and agreed with the findings in the report. No results found.   ASSESSMENT & PLAN:  No problem-specific Assessment & Plan notes found for this encounter.   No orders of the defined types were placed in this encounter.  All questions were answered. The patient knows to call the clinic with any problems, questions or concerns. No barriers to learning was detected. I spent {CHL ONC TIME VISIT - KMMNO:[id number]} counseling the patient face to face. The total time spent in the appointment was {CHL ONC TIME VISIT - XYBFX:[id number]} and more than 50% was on counseling and review of test results     Alla Feeling, NP 12/13/19

## 2019-12-14 ENCOUNTER — Telehealth: Payer: Self-pay | Admitting: Radiation Therapy

## 2019-12-14 ENCOUNTER — Ambulatory Visit: Payer: Medicaid Other | Admitting: Radiation Oncology

## 2019-12-14 ENCOUNTER — Telehealth: Payer: Self-pay | Admitting: Nurse Practitioner

## 2019-12-14 ENCOUNTER — Ambulatory Visit: Payer: Medicaid Other | Admitting: Nurse Practitioner

## 2019-12-14 NOTE — Telephone Encounter (Signed)
I called [name], no answer on her phone. Her contact [place] answered and gave phone to [place]. She has been sick since Saturday, vomiting, increased pain, fatigue, little po and not out of bed much. She is declining, I explained her symptoms are from brain metastasis. She can not come in until Friday, but intends to come for [facility] treatments. She declined home health referral. She also agrees to try to come in early on 11/19 for IVF and supportive care. Will notify the team and send scheduling message.   Marissa Rue, NP

## 2019-12-14 NOTE — Telephone Encounter (Signed)
I called Marissa [name]'s number multiple times with no answer or ability to leave a voicemail. I was able to reach her friend, Marissa [name]. Marissa [name] reminded Marissa [name] of her appointments today for me. But she said that the patient just blew them off without plans to come. I called and spoke with Marissa [name] again 10 min after Marissa [name]'s scheduled treatment time of 1:00, Marissa [name] said that Marissa [name] was still home asleep on the couch.  Dr. Kathyrn Sheriff was present via WebEx to review the treatment plan with Marissa [name], physicist. Dr. Lisbeth Renshaw, Dr. Mickeal Skinner, and Shona Simpson were informed of the no showed visit.   Mont Dutton R.T.(R)(T) Radiation Special Procedures Navigator

## 2019-12-16 ENCOUNTER — Inpatient Hospital Stay: Payer: Medicaid Other | Admitting: Medical

## 2019-12-16 ENCOUNTER — Ambulatory Visit: Payer: Medicaid Other | Admitting: Radiation Oncology

## 2019-12-16 ENCOUNTER — Other Ambulatory Visit: Payer: Self-pay

## 2019-12-16 ENCOUNTER — Telehealth: Payer: Self-pay

## 2019-12-16 ENCOUNTER — Other Ambulatory Visit: Payer: Self-pay | Admitting: Internal Medicine

## 2019-12-16 ENCOUNTER — Inpatient Hospital Stay: Payer: Medicaid Other

## 2019-12-16 NOTE — Telephone Encounter (Signed)
Patient oxycodone was refilled on 12/07/19. The PDMP does not show where she picked up this medication.She should be able to pick this prescription up at the [facility] long [facility] pharmacy. This is the only pharmacy that has oxycodone consistently.

## 2019-12-16 NOTE — Telephone Encounter (Signed)
Pt called in to ask for Oxycodone refill MD made aware and will review

## 2019-12-16 NOTE — Telephone Encounter (Signed)
Refill request

## 2019-12-17 MED FILL — OXYCODONE HCL 20 MG TABS: 20 | 30 days supply | Qty: 120 | Fill #0

## 2019-12-19 ENCOUNTER — Ambulatory Visit: Payer: Medicaid Other | Admitting: Radiation Oncology

## 2019-12-19 NOTE — Progress Notes (Signed)
Palliative Medicine RN Note: Recd request for prior auth for pt's oxycodone. Sent information to insurance and rec'd approval. Returned fax to [place] notifying them it was approved.   "Dear Prior Chiropractor, [employer] updated the outcome for this PA: Favorable Request Key: [id number] PA created on: 2019-12-19 09:29:45 -0500"   Dejanique Ruehl G. Retina Bernardy, RN, BSN, [facility] Palliative Medicine Team 12/19/2019 10:08 AM Office [phone]

## 2019-12-20 ENCOUNTER — Ambulatory Visit: Payer: Medicaid Other | Admitting: Radiation Oncology

## 2019-12-21 ENCOUNTER — Ambulatory Visit: Payer: Medicaid Other | Admitting: Radiation Oncology

## 2019-12-21 ENCOUNTER — Telehealth: Payer: Self-pay | Admitting: Radiation Therapy

## 2019-12-21 NOTE — Telephone Encounter (Signed)
Called Marissa [name] using her friend, Amy's contact information, to see if she is planning to come in for her scheduled radiation treatment today. She said that she is just too tired to go anywhere today, but insists that she wants to have her treatments. She said that she will try to be here Monday 11/29, and is aware to be here at 1:30.   Mont Dutton R.T.(R)(T)  Radiation Special Procedures Navigator

## 2019-12-26 ENCOUNTER — Telehealth: Payer: Self-pay | Admitting: Radiation Therapy

## 2019-12-26 ENCOUNTER — Ambulatory Visit: Payer: Medicaid Other | Admitting: Radiation Oncology

## 2019-12-26 ENCOUNTER — Other Ambulatory Visit: Payer: Self-pay | Admitting: Radiation Oncology

## 2019-12-26 DIAGNOSIS — C3412 Malignant neoplasm of upper lobe, left bronchus or lung: Secondary | ICD-10-CM

## 2019-12-26 DIAGNOSIS — C7931 Secondary malignant neoplasm of brain: Secondary | ICD-10-CM

## 2019-12-26 NOTE — Telephone Encounter (Addendum)
Called Marissa [name] to check in after she did not show up for her scheduled radiation treatment today. She was not interested in coming in, said her head has been hurting very bad and she has fallen off of the couch twice today. She has requested a walker with a seat on it to help her get back and forth from the couch to the bathroom. She is having a very hard time getting around at home, her boyfriend has to take her to the bathroom because she cannot walk alone. I tried to encourage her again that she needs to come in and get her radiation treatments, she said, "I know , I know, my boyfriend has been on my case about that too."   I have sent the request for the walker to her treatment team and reminded her about her scheduled appointment on Wed 12/1.    Mont Dutton R.T.(R)(T) Radiation Special Procedures Navigator

## 2019-12-26 NOTE — Telephone Encounter (Signed)
Called Ms. Marissa [name] to respond about her request for a walker with a built in seat. Shona Simpson PA-C has placed an outpt palliative care referral for them to come review goals of care and assess her needs in the home. The goal is that she will accept home health services through them to help better manage her current challenges with basic daily activities. (she declined a couple weeks ago when presented by [facility])   Mont Dutton R.T.(R)(T) Radiation Special Procedures Navigator

## 2019-12-27 ENCOUNTER — Encounter: Payer: Self-pay | Admitting: *Deleted

## 2019-12-27 NOTE — Progress Notes (Signed)
    Rec'd referral from Dr [facility] office with [facility] for f/u on Goals of Care.  TC to pt's cell # today [phone] answer, went straight to VM.  Call to Emergency Leodis Rains [phone] who lives in home with pt along with Clorine's boyfriend-Brian Goins.  Spoke with both Amy and Aaron Edelman today and they both express their concern over pt's deteriorating physical condition characterized by frequent falls, increased sleeping, pills in disarray but are unsure if  Pt would be receptive to [facility] to talk about goals-of-care at this time.  Aaron Edelman plans to address with pt and will call back with an update.  Thank you for the referral and the opportunity to serve pt.  Please call [facility] for updates on the status of this referral.   [phone] Wynetta Fines, RN

## 2019-12-28 ENCOUNTER — Telehealth: Payer: Self-pay | Admitting: Radiation Therapy

## 2019-12-28 ENCOUNTER — Ambulatory Visit: Payer: Medicaid Other | Attending: Radiation Oncology | Admitting: Radiation Oncology

## 2019-12-28 DIAGNOSIS — C7931 Secondary malignant neoplasm of brain: Secondary | ICD-10-CM | POA: Insufficient documentation

## 2019-12-28 NOTE — Telephone Encounter (Signed)
Called to check on Daisja and see if she intended on coming in for her scheduled radiation today. She did not answer her phone and the inbox was full so I could not leave a message. I then called her friend Amy, Amy did answer and attempted to hand the phone off to [place], but [place] hung up not wanting to talk at this time. I have contacted the treatment machine and physics to let them know she will not be in for treatment today. I will also let Dr. Lisbeth Renshaw know since there is a concern of needing to re-plan her treatment due to the delayed start.   Mont Dutton R.T.(R)(T) Radiation Special Procedures Navigator

## 2019-12-29 ENCOUNTER — Encounter (HOSPITAL_COMMUNITY): Payer: Self-pay | Admitting: Radiology

## 2019-12-29 ENCOUNTER — Emergency Department (HOSPITAL_COMMUNITY): Payer: Medicaid Other

## 2019-12-29 ENCOUNTER — Other Ambulatory Visit: Payer: Self-pay

## 2019-12-29 ENCOUNTER — Inpatient Hospital Stay (HOSPITAL_COMMUNITY)
Admission: EM | Admit: 2019-12-29 | Discharge: 2020-01-01 | DRG: 054 | Disposition: A | Discharge status: Hospice | Payer: Medicaid Other | Attending: Internal Medicine | Admitting: Internal Medicine

## 2019-12-29 DIAGNOSIS — D75839 Thrombocytosis, unspecified: Secondary | ICD-10-CM | POA: Diagnosis present

## 2019-12-29 DIAGNOSIS — Z880 Allergy status to penicillin: Secondary | ICD-10-CM

## 2019-12-29 DIAGNOSIS — Z9221 Personal history of antineoplastic chemotherapy: Secondary | ICD-10-CM

## 2019-12-29 DIAGNOSIS — Z66 Do not resuscitate: Secondary | ICD-10-CM | POA: Diagnosis present

## 2019-12-29 DIAGNOSIS — C3412 Malignant neoplasm of upper lobe, left bronchus or lung: Secondary | ICD-10-CM | POA: Diagnosis present

## 2019-12-29 DIAGNOSIS — Z20822 Contact with and (suspected) exposure to covid-19: Secondary | ICD-10-CM | POA: Diagnosis present

## 2019-12-29 DIAGNOSIS — D630 Anemia in neoplastic disease: Secondary | ICD-10-CM | POA: Diagnosis present

## 2019-12-29 DIAGNOSIS — J449 Chronic obstructive pulmonary disease, unspecified: Secondary | ICD-10-CM | POA: Diagnosis present

## 2019-12-29 DIAGNOSIS — Z781 Physical restraint status: Secondary | ICD-10-CM

## 2019-12-29 DIAGNOSIS — G936 Cerebral edema: Secondary | ICD-10-CM | POA: Diagnosis present

## 2019-12-29 DIAGNOSIS — C771 Secondary and unspecified malignant neoplasm of intrathoracic lymph nodes: Secondary | ICD-10-CM | POA: Diagnosis present

## 2019-12-29 DIAGNOSIS — F32A Depression, unspecified: Secondary | ICD-10-CM | POA: Diagnosis present

## 2019-12-29 DIAGNOSIS — Z515 Encounter for palliative care: Secondary | ICD-10-CM

## 2019-12-29 DIAGNOSIS — R112 Nausea with vomiting, unspecified: Secondary | ICD-10-CM | POA: Diagnosis present

## 2019-12-29 DIAGNOSIS — F17210 Nicotine dependence, cigarettes, uncomplicated: Secondary | ICD-10-CM | POA: Diagnosis present

## 2019-12-29 DIAGNOSIS — E876 Hypokalemia: Secondary | ICD-10-CM | POA: Diagnosis present

## 2019-12-29 DIAGNOSIS — Z923 Personal history of irradiation: Secondary | ICD-10-CM

## 2019-12-29 DIAGNOSIS — Z79899 Other long term (current) drug therapy: Secondary | ICD-10-CM

## 2019-12-29 DIAGNOSIS — C7931 Secondary malignant neoplasm of brain: Principal | ICD-10-CM

## 2019-12-29 DIAGNOSIS — Z8249 Family history of ischemic heart disease and other diseases of the circulatory system: Secondary | ICD-10-CM

## 2019-12-29 LAB — CBC WITH DIFFERENTIAL/PLATELET
Abs Immature Granulocytes: 0.06 10*3/uL (ref 0.00–0.07)
Basophils Absolute: 0 10*3/uL (ref 0.0–0.1)
Basophils Relative: 0 %
Eosinophils Absolute: 0 10*3/uL (ref 0.0–0.5)
Eosinophils Relative: 0 %
HCT: 33.3 % — ABNORMAL LOW (ref 36.0–46.0)
Hemoglobin: 10 g/dL — ABNORMAL LOW (ref 12.0–15.0)
Immature Granulocytes: 1 %
Lymphocytes Relative: 3 %
Lymphs Abs: 0.4 10*3/uL — ABNORMAL LOW (ref 0.7–4.0)
MCH: 26.7 pg (ref 26.0–34.0)
MCHC: 30 g/dL (ref 30.0–36.0)
MCV: 89 fL (ref 80.0–100.0)
Monocytes Absolute: 0.5 10*3/uL (ref 0.1–1.0)
Monocytes Relative: 5 %
Neutro Abs: 9.3 10*3/uL — ABNORMAL HIGH (ref 1.7–7.7)
Neutrophils Relative %: 91 %
Platelets: 534 10*3/uL — ABNORMAL HIGH (ref 150–400)
RBC: 3.74 MIL/uL — ABNORMAL LOW (ref 3.87–5.11)
RDW: 19.2 % — ABNORMAL HIGH (ref 11.5–15.5)
WBC: 10.2 10*3/uL (ref 4.0–10.5)
nRBC: 0 % (ref 0.0–0.2)

## 2019-12-29 LAB — COMPREHENSIVE METABOLIC PANEL
ALT: 32 U/L (ref 0–44)
AST: 22 U/L (ref 15–41)
Albumin: 4.1 g/dL (ref 3.5–5.0)
Alkaline Phosphatase: 80 U/L (ref 38–126)
Anion gap: 14 (ref 5–15)
BUN: 25 mg/dL — ABNORMAL HIGH (ref 6–20)
CO2: 27 mmol/L (ref 22–32)
Calcium: 9.9 mg/dL (ref 8.9–10.3)
Chloride: 102 mmol/L (ref 98–111)
Creatinine, Ser: 0.57 mg/dL (ref 0.44–1.00)
GFR, Estimated: >60 mL/min (ref >60)
Glucose, Bld: 121 mg/dL — ABNORMAL HIGH (ref 70–99)
Potassium: 3.1 mmol/L — ABNORMAL LOW (ref 3.5–5.1)
Sodium: 143 mmol/L (ref 135–145)
Total Bilirubin: 0.4 mg/dL (ref 0.3–1.2)
Total Protein: 7.9 g/dL (ref 6.5–8.1)

## 2019-12-29 LAB — RESP PANEL BY RT-PCR (FLU A&B, COVID) ARPGX2
Influenza A by PCR: NEGATIVE
Influenza B by PCR: NEGATIVE
SARS Coronavirus 2 by RT PCR: NEGATIVE

## 2019-12-29 LAB — LACTIC ACID, PLASMA: Lactic Acid, Venous: 1.2 mmol/L (ref 0.5–1.9)

## 2019-12-29 LAB — LIPASE, BLOOD: Lipase: 22 U/L (ref 11–51)

## 2019-12-29 MED ORDER — HYDROMORPHONE HCL 1 MG/ML IJ SOLN: 0.5000 mg | INTRAMUSCULAR | Status: DC | PRN

## 2019-12-29 MED ORDER — OXYCODONE HCL 5 MG PO TABS
5.0000 mg | ORAL_TABLET | ORAL | Status: DC | PRN
  Administered 2019-12-29: 19:00:00 5 mg via ORAL
  Filled 2019-12-29: qty 1

## 2019-12-29 MED ORDER — POTASSIUM CHLORIDE CRYS ER 20 MEQ PO TBCR: 40.0000 meq | EXTENDED_RELEASE_TABLET | Freq: Once | ORAL | Status: DC

## 2019-12-29 MED ORDER — DRONABINOL 5 MG PO CAPS: 5.0000 mg | ORAL_CAPSULE | Freq: Two times a day (BID) | ORAL | Status: DC

## 2019-12-29 MED ORDER — ACETAMINOPHEN 325 MG PO TABS
650.0000 mg | ORAL_TABLET | Freq: Four times a day (QID) | ORAL | Status: DC | PRN
  Administered 2019-12-31 – 2020-01-01 (×2): 650 mg via ORAL
  Filled 2019-12-29 (×2): qty 2

## 2019-12-29 MED ORDER — DEXAMETHASONE SODIUM PHOSPHATE 4 MG/ML IJ SOLN
4.0000 mg | Freq: Two times a day (BID) | INTRAMUSCULAR | Status: DC
  Administered 2019-12-29 – 2020-01-01 (×5): 4 mg via INTRAVENOUS
  Filled 2019-12-29 (×6): qty 1

## 2019-12-29 MED ORDER — LORAZEPAM 1 MG PO TABS
1.0000 mg | ORAL_TABLET | Freq: Two times a day (BID) | ORAL | Status: DC | PRN
  Administered 2019-12-30: 05:00:00 1 mg via ORAL
  Filled 2019-12-29: qty 1

## 2019-12-29 MED ORDER — PROCHLORPERAZINE EDISYLATE 10 MG/2ML IJ SOLN
10.0000 mg | Freq: Four times a day (QID) | INTRAMUSCULAR | Status: DC | PRN
  Administered 2020-01-01: 10 mg via INTRAVENOUS
  Filled 2019-12-29: qty 2

## 2019-12-29 MED ORDER — ACETAMINOPHEN 650 MG RE SUPP: 650.0000 mg | Freq: Four times a day (QID) | RECTAL | Status: DC | PRN

## 2019-12-29 MED ORDER — POTASSIUM CHLORIDE CRYS ER 20 MEQ PO TBCR
40.0000 meq | EXTENDED_RELEASE_TABLET | Freq: Every day | ORAL | Status: DC
  Administered 2019-12-30 – 2020-01-01 (×3): 40 meq via ORAL
  Filled 2019-12-29 (×3): qty 2

## 2019-12-29 MED ORDER — SODIUM CHLORIDE 0.9 % IV BOLUS
1000.0000 mL | Freq: Once | INTRAVENOUS | Status: AC
  Administered 2019-12-29: 1000 mL via INTRAVENOUS

## 2019-12-29 MED ORDER — ONDANSETRON HCL 4 MG/2ML IJ SOLN
4.0000 mg | Freq: Once | INTRAMUSCULAR | Status: AC
  Administered 2019-12-29: 4 mg via INTRAVENOUS
  Filled 2019-12-29: qty 2

## 2019-12-29 MED ORDER — LORAZEPAM 2 MG/ML IJ SOLN
1.0000 mg | Freq: Once | INTRAMUSCULAR | Status: AC
  Administered 2019-12-29: 1 mg via INTRAVENOUS
  Filled 2019-12-29: qty 1

## 2019-12-29 NOTE — ED Notes (Signed)
Attempted to call report. RN unavailable. Will call me back.

## 2019-12-29 NOTE — ED Provider Notes (Signed)
  Provider Note MRN:  [id number]  Arrival date & time: 12/29/19    ED Course and Medical Decision Making  Assumed care from Dr. Sherry Ruffing at shift change.  Vasogenic edema in the setting of metastatic brain cancer, will admit to medicine.  Neurosurgery has been consulted and they do not recommend steroid dose change at this time.  Procedures  Final Clinical Impressions(s) / ED Diagnoses     ICD-10-CM   1. Metastatic cancer to brain [facility])  C79.31   2. Vasogenic edema [facility])  G93.6     ED Discharge Orders    None      Discharge Instructions   None     Barth Kirks. [place] Small, [place] [email or web address]    Maudie Flakes, MD 12/29/19 [phone]

## 2019-12-29 NOTE — H&P (Signed)
History and Physical    Paulette Rockford NGE:[id number] DOB: 08-24-1965 DOA: 12/29/2019  PCP: [name], MD  Patient coming from: Home  Chief Complaint: HA, N, altered mental status  HPI: Jadelynn Boylan is a 54 y.o. 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 Presenting with increasing fatigue, confusion, HA, and nausea.  She is somnolent right now and unable to give history. Hx is from her significant other. He reports 3 days of increasing falls. She has seemed more off balanced and confused. She has been complaining of increased HA for which her normal pain medicine have not been effective. There is some question as to how much of her meds she has been taking. He reports that some of her pain medicines have gone missing. Her symptoms have continued through this morning. When she started becoming nauseous, he became concerned and called for EMS.   ED Course: CTH showed brain mets with worsening vasogenic edema and mass effect. EDP spoke with neurosurgery. No surgical intervention. Rec'd continuing current dosing of steroids. Rec'd palliative care consult. She was given meds for N/V. [facility] was called for admission.   Review of Systems:  Unable to obtain d/t mentation.   PMHx Past Medical History:  Diagnosis Date   Asthma    Chronic headaches    COPD (chronic obstructive pulmonary disease) (HCC)    Depression    Dyspnea    hospitalized July 2020   Heart murmur    Small cell lung cancer ([place])     PSHx Past Surgical History:  Procedure Laterality Date   APPLICATION OF CRANIAL NAVIGATION Right 09/19/2019   Procedure: APPLICATION OF CRANIAL NAVIGATION;  Surgeon: Consuella Lose, MD;  Location: [facility];  Service: Neurosurgery;  Laterality: Right;  APPLICATION OF CRANIAL NAVIGATION   BRONCHIAL BIOPSY  08/24/2018   Procedure: BRONCHIAL BIOPSIES;  Surgeon: Margaretha Seeds, MD;  Location: [facility];  Service: Cardiopulmonary;;   BRONCHIAL  BRUSHINGS  08/24/2018   Procedure: BRONCHIAL BRUSHINGS;  Surgeon: Margaretha Seeds, MD;  Location: [facility];  Service: Cardiopulmonary;;   BRONCHIAL NEEDLE ASPIRATION BIOPSY  08/24/2018   Procedure: BRONCHIAL NEEDLE ASPIRATION BIOPSIES;  Surgeon: Margaretha Seeds, MD;  Location: [facility];  Service: Cardiopulmonary;;   CRANIOTOMY Right 09/19/2019   Procedure: CRANIOTOMY TUMOR EXCISION, FRONTAL;  Surgeon: Consuella Lose, MD;  Location: [facility];  Service: Neurosurgery;  Laterality: Right;  CRANIOTOMY TUMOR EXCISION, FRONTAL   ENDOBRONCHIAL ULTRASOUND N/A 08/24/2018   Procedure: ENDOBRONCHIAL ULTRASOUND;  Surgeon: Margaretha Seeds, MD;  Location: [facility];  Service: Cardiopulmonary;  Laterality: N/A;   IR IMAGING GUIDED PORT INSERTION  09/01/2019   IR THORACENTESIS ASP PLEURAL SPACE W/IMG GUIDE  09/14/2018   TUBAL LIGATION     VIDEO BRONCHOSCOPY  08/24/2018   Procedure: VIDEO BRONCHOSCOPY;  Surgeon: Margaretha Seeds, MD;  Location: [facility];  Service: Cardiopulmonary;;    SocHx  reports that she has been smoking cigarettes. She started smoking about 40 years ago. She has a 78.00 pack-year smoking history. She has never used smokeless tobacco. She reports current alcohol use. She reports current drug use. Drug: Marijuana.  Allergies  Allergen Reactions   Penicillins Nausea And Vomiting    Did it involve swelling of the face/tongue/throat, SOB, or low BP? No Did it involve sudden or severe rash/hives, skin peeling, or any reaction on the inside of your mouth or nose? No Did you need to seek medical attention at a hospital or doctor's office? No When  did it last happen?20 years If all above answers are "NO", may proceed with cephalosporin use.    FamHx Family History  Problem Relation Age of Onset   Heart disease Mother    Dementia Father     Prior to Admission medications   Medication Sig Start Date End Date Taking? Authorizing Provider  acetaminophen  (TYLENOL) 325 MG tablet Take 1,300 mg by mouth every 6 (six) hours as needed for mild pain or moderate pain. Patient not taking: Reported on 11/29/2019    [provider]  baclofen (LIORESAL) 10 MG tablet Take 1 tablet (10 mg total) by mouth 3 (three) times daily as needed for muscle spasms. Patient not taking: Reported on 11/29/2019 02/21/19   Alla Feeling, NP  dexamethasone (DECADRON) 4 MG tablet Take 1 tablet (4 mg total) by mouth 2 (two) times daily. 11/29/19   Vaslow, Acey Lav, MD  diazepam (VALIUM) 10 MG tablet Take 2 tablets (20 mg total) by mouth See admin instructions. Take 1-2 tablets by mouth 30 minutes prior to MRI or radiation Patient not taking: Reported on 11/29/2019 11/16/19   [name], MD  dronabinol (MARINOL) 5 MG capsule Take 1 capsule (5 mg total) by mouth 2 (two) times daily before a meal. 11/16/19   [name], MD  DULoxetine (CYMBALTA) 20 MG capsule Take 2 capsules (40 mg total) by mouth daily. Take 1 capsule by mouth daily for the first week, then increase to 2 caps daily 02/17/19   Alla Feeling, NP  gabapentin (NEURONTIN) 300 MG capsule Take 1 tab in morning and 2 tabs at bedtime 10/14/19   [name], MD  lidocaine (LIDODERM) 5 % Place 1 patch onto the skin daily. 11/16/19   [name], MD  lidocaine-prilocaine (EMLA) cream Apply to affected area once 11/07/19   [name], MD  LORazepam (ATIVAN) 1 MG tablet Take 1 tablet (1 mg total) by mouth every 12 (twelve) hours as needed for anxiety. 11/16/19   [name], MD  ondansetron (ZOFRAN) 8 MG tablet Take 1 tablet (8 mg total) by mouth 2 (two) times daily as needed for refractory nausea / vomiting. Start on day 3 after carboplatin chemo. 11/07/19   [name], MD  Oxycodone HCl 20 MG TABS Take 1 tablet (20 mg total) by mouth 4 (four) times daily as needed. 12/07/19   Acquanetta Chain, DO  prochlorperazine (COMPAZINE) 10 MG tablet Take 1 tablet (10 mg total) by mouth every 6 (six) hours as needed (Nausea or vomiting).  11/07/19   [name], MD    Physical Exam: Vitals:   12/29/19 1430 12/29/19 1530 12/29/19 1600 12/29/19 1630  BP: (!) 143/70 (!) 142/65 (!) 144/64 (!) 145/63  Pulse: 74 77 81 81  Resp: 18 15 17 16   Temp:      TempSrc:      SpO2: 100% 96% 100% 97%    General: 54 y.o. female resting in bed in NAD Eyes: PERRL, normal sclera ENMT: Nares patent w/o discharge, orophaynx clear, dentition normal, ears w/o discharge/lesions/ulcers Neck: Supple, trachea midline Cardiovascular: RRR, +S1, S2, no m/g/r, equal pulses throughout Respiratory: CTABL, no w/r/r, normal WOB GI: BS+, NDNT, no masses noted, no organomegaly noted MSK: No e/c/c Skin: No bruises, ulcerations noted, small maculopapular rash on inner RLE just superior to right ankle  Neuro: somnolent; arousable to noxious stim, but not cooperating in interview/exam  Labs on Admission: I have personally reviewed following labs and imaging studies  CBC: Recent Labs  Lab  12/29/19 1104  WBC 10.2  NEUTROABS 9.3*  HGB 10.0*  HCT 33.3*  MCV 89.0  PLT 119*   Basic Metabolic Panel: Recent Labs  Lab 12/29/19 1104  NA 143  K 3.1*  CL 102  CO2 27  GLUCOSE 121*  BUN 25*  CREATININE 0.57  CALCIUM 9.9   GFR: CrCl cannot be calculated (Unknown ideal weight.). Liver Function Tests: Recent Labs  Lab 12/29/19 1104  AST 22  ALT 32  ALKPHOS 80  BILITOT 0.4  PROT 7.9  ALBUMIN 4.1   Recent Labs  Lab 12/29/19 1104  LIPASE 22   No results for input(s): AMMONIA in the last 168 hours. Coagulation Profile: No results for input(s): INR, PROTIME in the last 168 hours. Cardiac Enzymes: No results for input(s): CKTOTAL, CKMB, CKMBINDEX, TROPONINI in the last 168 hours. BNP (last 3 results) No results for input(s): PROBNP in the last 8760 hours. HbA1C: No results for input(s): HGBA1C in the last 72 hours. CBG: No results for input(s): GLUCAP in the last 168 hours. Lipid Profile: No results for input(s): CHOL, HDL, LDLCALC, TRIG,  CHOLHDL, LDLDIRECT in the last 72 hours. Thyroid Function Tests: No results for input(s): TSH, T4TOTAL, FREET4, T3FREE, THYROIDAB in the last 72 hours. Anemia Panel: No results for input(s): VITAMINB12, FOLATE, FERRITIN, TIBC, IRON, RETICCTPCT in the last 72 hours. Urine analysis:    Component Value Date/Time   COLORURINE YELLOW 03/16/2009 1747   APPEARANCEUR CLOUDY (A) 03/16/2009 1747   LABSPEC 1.025 03/16/2009 1747   PHURINE 8.5 (H) 03/16/2009 1747   GLUCOSEU NEGATIVE 03/16/2009 1747   HGBUR NEGATIVE 03/16/2009 1747   BILIRUBINUR NEGATIVE 03/16/2009 1747   KETONESUR NEGATIVE 03/16/2009 1747   PROTEINUR 30 (A) 03/16/2009 1747   UROBILINOGEN 1.0 03/16/2009 1747   NITRITE NEGATIVE 03/16/2009 1747   LEUKOCYTESUR NEGATIVE 03/16/2009 1747    Radiological Exams on Admission: CT Head Wo Contrast  Result Date: 12/29/2019 CLINICAL DATA:  Known brain metastases. Worsening headache, nausea vomiting. Failure to thrive. History of lung carcinoma. EXAM: CT HEAD WITHOUT CONTRAST TECHNIQUE: Contiguous axial images were obtained from the base of the skull through the vertex without intravenous contrast. COMPARISON:  Brain MRI, 12/09/2019. FINDINGS: Brain: There is heterogeneous attenuation along the anterior inferior right frontal lobe deep to a craniotomy flap. There is significant vasogenic edema extending throughout the right frontal lobe into the adjacent right parietal and temporal lobes, with significant mass effect. There is near complete effacement of the right lateral ventricle. Midline shift to the left measures 1.4 cm. There is partial effacement of the right basilar cisterns without herniation. No evidence of acute infarction. No intracranial hemorrhage. No extra-axial masses or fluid collections. Vascular: No hyperdense vessel or unexpected calcification. Skull: No fracture. No bone lesion. Right frontal craniotomy flap is well aligned. Sinuses/Orbits: Soft tissue mass in the superior  extraconal right orbit broadly abutting the superior orbital wall, measuring 1.8 x 1.2 x 1.9 cm. This depresses the underlying superior rectus muscle. No right intraconal orbital abnormality. No abnormality of the right globe. Normal left globe and orbit. Visualized sinuses are clear. Other: None. IMPRESSION: 1. Vasogenic edema with significant right-sided mass effect from the known right frontotemporal brain masses. The masses are not well-defined on CT. The degree of mass effect has increased, with the right lateral ventricle now mostly effaced and midline shift to the left increased to 1.4 cm. 2. There is a right superior orbital, extraconal mass consistent with metastatic disease versus contiguous spread. No skull defect is evident.  This mass has increased in size from the prior brain MRI. 3. No evidence of acute infarction and no intracranial hemorrhage. Electronically Signed   By: Lajean Manes M.D.   On: 12/29/2019 12:45   DG Chest Portable 1 View  Result Date: 12/29/2019 CLINICAL DATA:  Headache nausea vomiting and fatigue. EXAM: PORTABLE CHEST 1 VIEW COMPARISON:  Chest radiograph August 19, 2019 and chest CT October 27, 2019 FINDINGS: Right chest wall porta catheter with tip overlying the superior cavoatrial junction. Unchanged heart size. Bulky mediastinal adenopathy similar to chest CT. Similar left upper lobe perihilar mass like consolidation with interlobular septal thickening. No new focal consolidation. No visible pneumothorax or pleural effusion. The visualized skeletal structures are unchanged. IMPRESSION: 1. Similar left upper lobe perihilar mass like consolidation with adjacent interlobular septal thickening. No new focal consolidation. Electronically Signed   By: Dahlia Bailiff MD   On: 12/29/2019 11:15    EKG: Independently reviewed. Sinus, no st elevation  Assessment/Plan N/V     - place in obs, med-surg     - likely secondary to brain mets     - per EDP, spoke with neurosurgery,  no surgical intervention warranted; continue current steroid dosing, rec'd palliative care consult     - compazine Hx of small cell lung CA w/ brain mets Somnolence HA     - per EDP, spoke with neurosurgery, no surgical intervention warranted; continue current steroid dosing, rec'd palliative care consult     - follows w/ Dr Burr Medico (contacted by secure chat to update that patient is here)     - consult palliative care  Hypokalemia     - replace, check Mg2+  Normocytic anemia     - no evidence of bleed, follow  DVT prophylaxis: SCDs  Code Status: DNR; confirmed with significant other  Family Communication: with significant other by phone  Consults called: [facility] notified by secure chat, EDP spoke with neurosurgery, palliative care consulted.  Status is: Observation  The patient remains OBS appropriate and will d/c before 2 midnights.  Dispo: The patient is from: Home              Anticipated d/c is to: Home              Anticipated d/c date is: 1 day              Patient currently is not medically stable to d/c.  Jonnie Finner DO [facility]  If 7PM-7AM, please contact night-coverage [email or web address]  12/29/2019, 5:02 PM

## 2019-12-29 NOTE — ED Notes (Signed)
Pt placed on purewick at 60 mmHg. 

## 2019-12-29 NOTE — ED Triage Notes (Signed)
Presents via EMS with reports of failure to thrive and altered LOC. Pt has hx of lung and brain cancer. Pt is oriented to person and place, not sure of time.

## 2019-12-29 NOTE — ED Provider Notes (Signed)
[facility] DEPT Provider Note   CSN: [id number] Arrival date & time: 12/29/19  1497     History Chief Complaint  Patient presents with  . Altered Mental Status    Marissa [name] is a 54 y.o. female.  The history is provided by the patient and medical records. No language interpreter was used.  Headache Pain location:  Generalized Quality:  Dull Severity currently:  10/10 Severity at highest:  10/10 Onset quality:  Gradual Duration:  2 weeks Timing:  Constant Progression:  Unchanged Chronicity:  New Similar to prior headaches: yes   Relieved by:  Nothing Worsened by:  Nothing Ineffective treatments:  None tried Associated symptoms: fatigue, nausea and vomiting   Associated symptoms: no back pain, no blurred vision, no congestion, no cough, no diarrhea, no dizziness, no fever, no focal weakness, no neck pain, no neck stiffness, no numbness, no paresthesias, no photophobia, no seizures, no URI, no visual change and no weakness  Abdominal pain: mild.        Past Medical History:  Diagnosis Date  . Asthma   . Chronic headaches   . COPD (chronic obstructive pulmonary disease) ([place])   . Depression   . Dyspnea    hospitalized July 2020  . Heart murmur   . Small cell lung cancer [facility])     Patient Active Problem List   Diagnosis Date Noted  . Port-A-Cath in place 11/16/2019  . Palliative care patient 10/14/2019  . Brain tumor ([place]) 09/19/2019  . Brain metastasis ([place]) 09/19/2019  . Goals of care, counseling/discussion 08/18/2019  . Brain metastases ([place]) 02/21/2019  . Drug-induced nausea and vomiting 10/08/2018  . Pleural effusion on left 09/13/2018  . Small cell lung cancer, left upper lobe ([place])   . Pneumothorax 08/24/2018  . Pneumothorax after biopsy 08/24/2018  . Lymphadenopathy, hilar 08/20/2018  . Nodule of left lung 08/20/2018  . Mediastinal lymphadenopathy 08/20/2018  . Respiratory failure with hypoxia ([place]) 07/29/2018  .  Chronic daily headache 07/29/2018  . Microcytic anemia 07/29/2018  . Tobacco abuse 07/29/2018    Past Surgical History:  Procedure Laterality Date  . APPLICATION OF CRANIAL NAVIGATION Right 09/19/2019   Procedure: APPLICATION OF CRANIAL NAVIGATION;  Surgeon: Consuella Lose, MD;  Location: [facility];  Service: Neurosurgery;  Laterality: Right;  APPLICATION OF CRANIAL NAVIGATION  . BRONCHIAL BIOPSY  08/24/2018   Procedure: BRONCHIAL BIOPSIES;  Surgeon: Margaretha Seeds, MD;  Location: [facility];  Service: Cardiopulmonary;;  . BRONCHIAL BRUSHINGS  08/24/2018   Procedure: BRONCHIAL BRUSHINGS;  Surgeon: Margaretha Seeds, MD;  Location: [facility];  Service: Cardiopulmonary;;  . BRONCHIAL NEEDLE ASPIRATION BIOPSY  08/24/2018   Procedure: BRONCHIAL NEEDLE ASPIRATION BIOPSIES;  Surgeon: Margaretha Seeds, MD;  Location: [facility];  Service: Cardiopulmonary;;  . CRANIOTOMY Right 09/19/2019   Procedure: CRANIOTOMY TUMOR EXCISION, FRONTAL;  Surgeon: Consuella Lose, MD;  Location: [facility];  Service: Neurosurgery;  Laterality: Right;  CRANIOTOMY TUMOR EXCISION, FRONTAL  . ENDOBRONCHIAL ULTRASOUND N/A 08/24/2018   Procedure: ENDOBRONCHIAL ULTRASOUND;  Surgeon: Margaretha Seeds, MD;  Location: [facility];  Service: Cardiopulmonary;  Laterality: N/A;  . IR IMAGING GUIDED PORT INSERTION  09/01/2019  . IR THORACENTESIS ASP PLEURAL SPACE W/IMG GUIDE  09/14/2018  . TUBAL LIGATION    . VIDEO BRONCHOSCOPY  08/24/2018   Procedure: VIDEO BRONCHOSCOPY;  Surgeon: Margaretha Seeds, MD;  Location: [facility];  Service: Cardiopulmonary;;     OB History   No obstetric history on file.  Family History  Problem Relation Age of Onset  . Heart disease Mother   . Dementia Father     Social History   Tobacco Use  . Smoking status: Current Some Day Smoker    Packs/day: 2.00    Years: 39.00    Pack years: 78.00    Types: Cigarettes    Start date: 06/22/1979  . Smokeless tobacco: Never Used  Vaping  Use  . Vaping Use: Never used  Substance Use Topics  . Alcohol use: Yes    Comment: occasional  . Drug use: Yes    Types: Marijuana    Comment: for appetite    Home Medications Prior to Admission medications   Medication Sig Start Date End Date Taking? Authorizing Provider  acetaminophen (TYLENOL) 325 MG tablet Take 1,300 mg by mouth every 6 (six) hours as needed for mild pain or moderate pain. Patient not taking: Reported on 11/29/2019    [provider]  baclofen (LIORESAL) 10 MG tablet Take 1 tablet (10 mg total) by mouth 3 (three) times daily as needed for muscle spasms. Patient not taking: Reported on 11/29/2019 02/21/19   Alla Feeling, NP  dexamethasone (DECADRON) 4 MG tablet Take 1 tablet (4 mg total) by mouth 2 (two) times daily. 11/29/19   Vaslow, Acey Lav, MD  diazepam (VALIUM) 10 MG tablet Take 2 tablets (20 mg total) by mouth See admin instructions. Take 1-2 tablets by mouth 30 minutes prior to MRI or radiation Patient not taking: Reported on 11/29/2019 11/16/19   [name], MD  dronabinol (MARINOL) 5 MG capsule Take 1 capsule (5 mg total) by mouth 2 (two) times daily before a meal. 11/16/19   [name], MD  DULoxetine (CYMBALTA) 20 MG capsule Take 2 capsules (40 mg total) by mouth daily. Take 1 capsule by mouth daily for the first week, then increase to 2 caps daily 02/17/19   Alla Feeling, NP  gabapentin (NEURONTIN) 300 MG capsule Take 1 tab in morning and 2 tabs at bedtime 10/14/19   [name], MD  lidocaine (LIDODERM) 5 % Place 1 patch onto the skin daily. 11/16/19   [name], MD  lidocaine-prilocaine (EMLA) cream Apply to affected area once 11/07/19   [name], MD  LORazepam (ATIVAN) 1 MG tablet Take 1 tablet (1 mg total) by mouth every 12 (twelve) hours as needed for anxiety. 11/16/19   [name], MD  ondansetron (ZOFRAN) 8 MG tablet Take 1 tablet (8 mg total) by mouth 2 (two) times daily as needed for refractory nausea / vomiting. Start on day 3 after  carboplatin chemo. 11/07/19   [name], MD  Oxycodone HCl 20 MG TABS Take 1 tablet (20 mg total) by mouth 4 (four) times daily as needed. 12/07/19   Acquanetta Chain, DO  prochlorperazine (COMPAZINE) 10 MG tablet Take 1 tablet (10 mg total) by mouth every 6 (six) hours as needed (Nausea or vomiting). 11/07/19   [name], MD    Allergies    Penicillins  Review of Systems   Review of Systems  Constitutional: Positive for fatigue. Negative for chills, diaphoresis and fever.  HENT: Negative for congestion.   Eyes: Negative for blurred vision and photophobia.  Respiratory: Negative for cough, chest tightness, shortness of breath and wheezing.   Cardiovascular: Negative for chest pain.  Gastrointestinal: Positive for nausea and vomiting. Negative for abdominal distention, constipation and diarrhea. Abdominal pain: mild.  Genitourinary: Positive for decreased urine volume. Negative for enuresis, flank pain and  frequency.  Musculoskeletal: Negative for back pain, neck pain and neck stiffness.  Skin: Negative for rash and wound.  Neurological: Positive for headaches. Negative for dizziness, focal weakness, seizures, speech difficulty, weakness, light-headedness, numbness and paresthesias.  Psychiatric/Behavioral: Positive for confusion. Negative for agitation.  All other systems reviewed and are negative.   Physical Exam Updated Vital Signs BP (!) 144/60 (BP Location: Right Arm)   Pulse 66   Temp (!) 97.5 F (36.4 C) (Oral)   Resp 18   LMP 04/24/2012   SpO2 98%   Physical Exam Vitals and nursing note reviewed.  Constitutional:      General: She is not in acute distress.    Appearance: She is well-developed. She is ill-appearing. She is not toxic-appearing or diaphoretic.  HENT:     Head: Atraumatic.     Right Ear: External ear normal.     Left Ear: External ear normal.     Nose: Nose normal. No congestion or rhinorrhea.     Mouth/Throat:     Mouth: Mucous membranes are dry.      Pharynx: No oropharyngeal exudate or posterior oropharyngeal erythema.  Eyes:     Extraocular Movements: Extraocular movements intact.     Conjunctiva/sclera: Conjunctivae normal.     Pupils: Pupils are equal, round, and reactive to light.  Cardiovascular:     Rate and Rhythm: Normal rate.     Pulses: Normal pulses.     Heart sounds: No murmur heard.   Pulmonary:     Effort: Pulmonary effort is normal. No respiratory distress.     Breath sounds: No stridor. Rhonchi present. No wheezing or rales.  Chest:     Chest wall: No tenderness.  Abdominal:     General: Abdomen is flat. There is no distension.     Tenderness: There is no abdominal tenderness. There is no right CVA tenderness, left CVA tenderness, guarding or rebound.  Musculoskeletal:        General: No tenderness.     Cervical back: Normal range of motion and neck supple. No tenderness.     Right lower leg: No edema.     Left lower leg: No edema.  Skin:    General: Skin is warm.     Capillary Refill: Capillary refill takes less than 2 seconds.     Coloration: Skin is pale.     Findings: No erythema or rash.  Neurological:     Mental Status: She is alert and oriented to person, place, and time.     Sensory: No sensory deficit.     Motor: No weakness or abnormal muscle tone.     Deep Tendon Reflexes: Reflexes are normal and symmetric.  Psychiatric:        Mood and Affect: Mood normal.     ED Results / Procedures / Treatments   Labs (all labs ordered are listed, but only abnormal results are displayed) Labs Reviewed  CBC WITH DIFFERENTIAL/PLATELET - Abnormal; Notable for the following components:      Result Value   RBC 3.74 (*)    Hemoglobin 10.0 (*)    HCT 33.3 (*)    RDW 19.2 (*)    Platelets 534 (*)    Neutro Abs 9.3 (*)    Lymphs Abs 0.4 (*)    All other components within normal limits  COMPREHENSIVE METABOLIC PANEL - Abnormal; Notable for the following components:   Potassium 3.1 (*)    Glucose, Bld  121 (*)    BUN 25 (*)  All other components within normal limits  RESP PANEL BY RT-PCR (FLU A&B, COVID) ARPGX2  URINE CULTURE  LACTIC ACID, PLASMA  LIPASE, BLOOD  LACTIC ACID, PLASMA  URINALYSIS, ROUTINE W REFLEX MICROSCOPIC    EKG EKG Interpretation  Date/Time:  Thursday December 29 2019 10:11:01 EST Ventricular Rate:  69 PR Interval:    QRS Duration: 94 QT Interval:  392 QTC Calculation: 420 R Axis:   93 Text Interpretation: Sinus rhythm Borderline prolonged PR interval Probable left atrial enlargement Borderline right axis deviation When compared to prior, similar apperance. NO sTEMI Confirmed by Antony Blackbird [phone]) on 12/29/2019 10:55:10 AM   Radiology CT Head Wo Contrast  Result Date: 12/29/2019 CLINICAL DATA:  Known brain metastases. Worsening headache, nausea vomiting. Failure to thrive. History of lung carcinoma. EXAM: CT HEAD WITHOUT CONTRAST TECHNIQUE: Contiguous axial images were obtained from the base of the skull through the vertex without intravenous contrast. COMPARISON:  Brain MRI, 12/09/2019. FINDINGS: Brain: There is heterogeneous attenuation along the anterior inferior right frontal lobe deep to a craniotomy flap. There is significant vasogenic edema extending throughout the right frontal lobe into the adjacent right parietal and temporal lobes, with significant mass effect. There is near complete effacement of the right lateral ventricle. Midline shift to the left measures 1.4 cm. There is partial effacement of the right basilar cisterns without herniation. No evidence of acute infarction. No intracranial hemorrhage. No extra-axial masses or fluid collections. Vascular: No hyperdense vessel or unexpected calcification. Skull: No fracture. No bone lesion. Right frontal craniotomy flap is well aligned. Sinuses/Orbits: Soft tissue mass in the superior extraconal right orbit broadly abutting the superior orbital wall, measuring 1.8 x 1.2 x 1.9 cm. This depresses the  underlying superior rectus muscle. No right intraconal orbital abnormality. No abnormality of the right globe. Normal left globe and orbit. Visualized sinuses are clear. Other: None. IMPRESSION: 1. Vasogenic edema with significant right-sided mass effect from the known right frontotemporal brain masses. The masses are not well-defined on CT. The degree of mass effect has increased, with the right lateral ventricle now mostly effaced and midline shift to the left increased to 1.4 cm. 2. There is a right superior orbital, extraconal mass consistent with metastatic disease versus contiguous spread. No skull defect is evident. This mass has increased in size from the prior brain MRI. 3. No evidence of acute infarction and no intracranial hemorrhage. Electronically Signed   By: Lajean Manes M.D.   On: 12/29/2019 12:45   DG Chest Portable 1 View  Result Date: 12/29/2019 CLINICAL DATA:  Headache nausea vomiting and fatigue. EXAM: PORTABLE CHEST 1 VIEW COMPARISON:  Chest radiograph August 19, 2019 and chest CT October 27, 2019 FINDINGS: Right chest wall porta catheter with tip overlying the superior cavoatrial junction. Unchanged heart size. Bulky mediastinal adenopathy similar to chest CT. Similar left upper lobe perihilar mass like consolidation with interlobular septal thickening. No new focal consolidation. No visible pneumothorax or pleural effusion. The visualized skeletal structures are unchanged. IMPRESSION: 1. Similar left upper lobe perihilar mass like consolidation with adjacent interlobular septal thickening. No new focal consolidation. Electronically Signed   By: Dahlia Bailiff MD   On: 12/29/2019 11:15    Procedures Procedures (including critical care time)  Medications Ordered in ED Medications  potassium chloride SA (KLOR-CON) CR tablet 40 mEq (has no administration in time range)  ondansetron (ZOFRAN) injection 4 mg (4 mg Intravenous Given 12/29/19 1110)  sodium chloride 0.9 % bolus 1,000 mL  (0 mLs Intravenous Stopped  12/29/19 1300)  LORazepam (ATIVAN) injection 1 mg (1 mg Intravenous Given 12/29/19 1139)    ED Course  I have reviewed the triage vital signs and the nursing notes.  Pertinent labs & imaging results that were available during my care of the patient were reviewed by me and considered in my medical decision making (see chart for details).    MDM Rules/Calculators/A&P                          Marissa [name] is a 54 y.o. female with a past medical history significant for lung cancer status post brain metastasis currently supposed to be getting radiation therapy, depression, anxiety, and prior tobacco abuse who presents for 2 weeks of worsening fatigue, headaches, nausea, vomiting, decreased oral intake, no energy, and some abdominal cramping.  Patient reports that she has been feeling fatigued and tired and has been getting intermittently confused.  EMS reports that she did not have any focal injuries or new trauma but was not eating or drinking well.  She says that she is having tinnitus and headache does not respond to her medications and she is having nausea and vomiting.  After all the vomiting she starts having some abdominal cramping.  She denies abdominal pain at this time when she is not vomiting.  No chest pain or shortness of breath.  No palpitations.  She denies significant cough, fevers, chills.  No neck pain.  No other back pain.  She reported less urination but no dysuria.  No constipation or diarrhea.  On exam, patient is frail appearing and pale.  Her lungs had some mild rhonchi and there was also Stolle murmur.  Chart review shows she has a murmur at baseline.  Chest and abdomen were nontender.  Normal bowel sounds.  Patient has symmetric strength in extremities and had sensation throughout.  She does have mild ptosis in her right eye compared to left but otherwise pupils are symmetric and reactive normal extraocular movements.  Clear speech.  Mucous membranes were  dry on exam.  Clinically I am concerned about potentially worsening edema in her brain metastasis leading to the headaches, nausea, and vomiting.  We will get a head CT initially.  We will get other screening labs look for dehydration given the nausea and vomiting for several weeks without eating or drinking much.  We will look for occult infection with a change in urine amount as well screening labs for the abdominal aching likely related to all her vomiting.  Anticipate reassessment after work-up.  Will give some fluids and nausea medicine initially.  3:02 PM Work-up is begun to return.  Labs show mild hypokalemia but otherwise similar to prior.  CT head showed worsened vasogenic edema and right-sided mass-effect from the known right frontotemporal brain masses.  The mass-effect is increasing midline shift now 1.4 cm.  There is also increase in size on the right superior orbital metastatic mass.  No evidence of hemorrhage or infarct seen.  Chest x-ray shows no pneumonia.  I suspect her headaches, fatigue, nausea, vomiting, and somnolence are related to the worsened cerebral edema.  We will touch base with neurosurgery however anticipate she will need admission to medicine service for further management.  3:25 PM I spoke with the neurosurgical PA who agrees with the medical admission.  He said he would not change the steroids at that time and agreed that patient likely needs to have a discussion with the oncology team, hospitalist, and  likely palliative care medicine.  Medicine team will be called for admission.  Oral potassium will be ordered for the mild hypokalemia discovered.   Final Clinical Impression(s) / ED Diagnoses Final diagnoses:  Metastatic cancer to brain ([place])  Vasogenic edema ([facility])    Clinical Impression: 1. Metastatic cancer to brain ([place])   2. Vasogenic edema ([place])     Disposition: Admit  This note was prepared with assistance of Dragon voice recognition software.  Occasional wrong-word or sound-a-like substitutions may have occurred due to the inherent limitations of voice recognition software.     Tamaya Pun, Gwenyth Allegra, MD 12/29/19 [phone]

## 2019-12-29 NOTE — ED Notes (Signed)
Received a call from CT that pt was very combative and aggressive towards staff. Ativan ordered however pt already pulled out IV.  Pt brought back to room and accessed right chest port. Ativan given per order. CT made aware.

## 2019-12-29 NOTE — ED Notes (Signed)
Pt back to CT via stretcher

## 2019-12-29 NOTE — ED Notes (Signed)
Report given to Cristy on 3E.

## 2019-12-29 NOTE — ED Notes (Signed)
In the middle of giving report, floor staff advised that the bed assignment changed to another floor.  Will call report when new assignment is made.

## 2019-12-30 ENCOUNTER — Ambulatory Visit: Payer: Medicaid Other | Admitting: Radiation Oncology

## 2019-12-30 DIAGNOSIS — Z880 Allergy status to penicillin: Secondary | ICD-10-CM | POA: Diagnosis not present

## 2019-12-30 DIAGNOSIS — C771 Secondary and unspecified malignant neoplasm of intrathoracic lymph nodes: Secondary | ICD-10-CM | POA: Diagnosis present

## 2019-12-30 DIAGNOSIS — Z9221 Personal history of antineoplastic chemotherapy: Secondary | ICD-10-CM | POA: Diagnosis not present

## 2019-12-30 DIAGNOSIS — F32A Depression, unspecified: Secondary | ICD-10-CM | POA: Diagnosis present

## 2019-12-30 DIAGNOSIS — Z515 Encounter for palliative care: Secondary | ICD-10-CM | POA: Diagnosis not present

## 2019-12-30 DIAGNOSIS — R112 Nausea with vomiting, unspecified: Secondary | ICD-10-CM | POA: Diagnosis not present

## 2019-12-30 DIAGNOSIS — G936 Cerebral edema: Secondary | ICD-10-CM | POA: Diagnosis present

## 2019-12-30 DIAGNOSIS — C7931 Secondary malignant neoplasm of brain: Secondary | ICD-10-CM | POA: Diagnosis present

## 2019-12-30 DIAGNOSIS — E876 Hypokalemia: Secondary | ICD-10-CM | POA: Diagnosis present

## 2019-12-30 DIAGNOSIS — D630 Anemia in neoplastic disease: Secondary | ICD-10-CM | POA: Diagnosis present

## 2019-12-30 DIAGNOSIS — Z20822 Contact with and (suspected) exposure to covid-19: Secondary | ICD-10-CM | POA: Diagnosis present

## 2019-12-30 DIAGNOSIS — F17210 Nicotine dependence, cigarettes, uncomplicated: Secondary | ICD-10-CM | POA: Diagnosis present

## 2019-12-30 DIAGNOSIS — R4182 Altered mental status, unspecified: Secondary | ICD-10-CM | POA: Diagnosis not present

## 2019-12-30 DIAGNOSIS — Z781 Physical restraint status: Secondary | ICD-10-CM | POA: Diagnosis not present

## 2019-12-30 DIAGNOSIS — C3490 Malignant neoplasm of unspecified part of unspecified bronchus or lung: Secondary | ICD-10-CM

## 2019-12-30 DIAGNOSIS — Z79899 Other long term (current) drug therapy: Secondary | ICD-10-CM | POA: Diagnosis not present

## 2019-12-30 DIAGNOSIS — Z8249 Family history of ischemic heart disease and other diseases of the circulatory system: Secondary | ICD-10-CM | POA: Diagnosis not present

## 2019-12-30 DIAGNOSIS — D75839 Thrombocytosis, unspecified: Secondary | ICD-10-CM | POA: Diagnosis present

## 2019-12-30 DIAGNOSIS — Z923 Personal history of irradiation: Secondary | ICD-10-CM | POA: Diagnosis not present

## 2019-12-30 DIAGNOSIS — J449 Chronic obstructive pulmonary disease, unspecified: Secondary | ICD-10-CM | POA: Diagnosis present

## 2019-12-30 DIAGNOSIS — C3412 Malignant neoplasm of upper lobe, left bronchus or lung: Secondary | ICD-10-CM | POA: Diagnosis present

## 2019-12-30 DIAGNOSIS — Z66 Do not resuscitate: Secondary | ICD-10-CM | POA: Diagnosis present

## 2019-12-30 LAB — CBC
HCT: 31.8 % — ABNORMAL LOW (ref 36.0–46.0)
Hemoglobin: 9.4 g/dL — ABNORMAL LOW (ref 12.0–15.0)
MCH: 26.9 pg (ref 26.0–34.0)
MCHC: 29.6 g/dL — ABNORMAL LOW (ref 30.0–36.0)
MCV: 90.9 fL (ref 80.0–100.0)
Platelets: 519 10*3/uL — ABNORMAL HIGH (ref 150–400)
RBC: 3.5 MIL/uL — ABNORMAL LOW (ref 3.87–5.11)
RDW: 19.3 % — ABNORMAL HIGH (ref 11.5–15.5)
WBC: 10.2 10*3/uL (ref 4.0–10.5)
nRBC: 0 % (ref 0.0–0.2)

## 2019-12-30 LAB — COMPREHENSIVE METABOLIC PANEL
ALT: 29 U/L (ref 0–44)
AST: 22 U/L (ref 15–41)
Albumin: 3.8 g/dL (ref 3.5–5.0)
Alkaline Phosphatase: 70 U/L (ref 38–126)
Anion gap: 13 (ref 5–15)
BUN: 22 mg/dL — ABNORMAL HIGH (ref 6–20)
CO2: 23 mmol/L (ref 22–32)
Calcium: 9.6 mg/dL (ref 8.9–10.3)
Chloride: 105 mmol/L (ref 98–111)
Creatinine, Ser: 0.54 mg/dL (ref 0.44–1.00)
GFR, Estimated: >60 mL/min (ref >60)
Glucose, Bld: 113 mg/dL — ABNORMAL HIGH (ref 70–99)
Potassium: 3.2 mmol/L — ABNORMAL LOW (ref 3.5–5.1)
Sodium: 141 mmol/L (ref 135–145)
Total Bilirubin: 0.3 mg/dL (ref 0.3–1.2)
Total Protein: 7 g/dL (ref 6.5–8.1)

## 2019-12-30 LAB — HIV ANTIBODY (ROUTINE TESTING W REFLEX): HIV Screen 4th Generation wRfx: NONREACTIVE

## 2019-12-30 MED ORDER — SODIUM CHLORIDE 0.9% FLUSH
10.0000 mL | Freq: Two times a day (BID) | INTRAVENOUS | Status: DC
  Administered 2019-12-30 – 2020-01-01 (×2): 10 mL

## 2019-12-30 MED ORDER — LORAZEPAM 2 MG/ML IJ SOLN: 0.5000 mg | INTRAMUSCULAR | Status: DC | PRN

## 2019-12-30 MED ORDER — HALOPERIDOL LACTATE 5 MG/ML IJ SOLN
2.0000 mg | Freq: Four times a day (QID) | INTRAMUSCULAR | Status: DC | PRN
  Administered 2019-12-30 (×2): 5 mg via INTRAVENOUS
  Filled 2019-12-30 (×2): qty 1

## 2019-12-30 MED ORDER — SODIUM CHLORIDE 0.9% FLUSH: 10.0000 mL | INTRAVENOUS | Status: DC | PRN

## 2019-12-30 MED ORDER — CHLORHEXIDINE GLUCONATE CLOTH 2 % EX PADS
6.0000 | MEDICATED_PAD | Freq: Every day | CUTANEOUS | Status: DC
  Administered 2019-12-30 – 2020-01-01 (×3): 6 via TOPICAL

## 2019-12-30 NOTE — Progress Notes (Signed)
Patient's friend, Amy, called for an update. Verbal okay given to me from patient to speak to friend.

## 2019-12-30 NOTE — Progress Notes (Signed)
Patient removed port. Restraints checked and IV team consulted.

## 2019-12-30 NOTE — Progress Notes (Signed)
IVT consult note:  At bedside explaining procedure to patient for [facility] access. Patient attempting to get out of bed, stating she wants to go to [place].RN consult for port access dt pt pulling and removal of needle by patient.Patient continued to sit up in bed and attempting to get out of bed. Primary care nurse called to bedside to inform of pt status. Will come back to access port when pt is calm.

## 2019-12-30 NOTE — Progress Notes (Addendum)
Received consult from nurse that patient pulled out her port needle while in restraints. She also pulled out her port needle last night. Spoke with Vaughan Basta, RN (another VAS/IV team member) who suggested accessing port one more time, reposition the tubing away from the patient, and if she pulls the needle out again, PIV access will be started. Patient has limited veins visible upon assessment for PIV. RN made aware of plan.

## 2019-12-30 NOTE — Progress Notes (Signed)
IV team consulted for port reaccess. Patient combative and pulling lines. MD paged. Soft restraints initiated. Family contact attempted but no answer. Patient educated on use of restraints. Patient still restless. MD paged. See MAR for new orders.

## 2019-12-30 NOTE — Progress Notes (Addendum)
HEMATOLOGY-ONCOLOGY PROGRESS NOTE  SUBJECTIVE: Marissa [name] is well-known to our service.  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  She has known brain mets.  She was started on systemic treatment with carboplatin, etoposide, Tecentriq which was started on 11/07/2019, status post 1 cycle.  The patient no showed for her future appointments.  She was also seen by radiation oncology on 11/23/2019 for consideration of radiation to her brain.  It appears as though this was never started as the patient did not keep her follow-up appointments.  The patient was brought by EMS to the hospital due to headache, nausea, vomiting, and altered mental status.  CT of the head performed on admission showed vasogenic edema with significant right-sided mass-effect from the known right frontotemporal brain masses-degree of mass-effect has increased with right lateral ventricle now mostly effaced and midline shift to the left increased to 1.4 cm, right superior orbital, extraconal mass consistent with metastatic disease versus contiguous spread-mass has increased in size from the prior brain MRI.  The patient is somnolent today.  Nursing notes reviewed and note that the patient has been pulling out IV lines and has been combative.  Currently in soft restraints.  She is overall somnolent.  Did open eyes and was able to tell me that her head hurts.  Was unable to answer any other questions for me today.   Oncology History Overview Note  Cancer Staging Small cell lung cancer, left upper lobe [facility]) Staging form: Lung, AJCC 8th Edition - Clinical stage from 08/24/2018: Stage IIIA (cT1a, cN2, cM0) - Signed by [name], MD on 10/17/2018    Small cell lung cancer, left upper lobe ([place])  07/29/2018 Imaging   CT Angio Chest 07/29/18  IMPRESSION: 1. Malignant appearing left mediastinal and hilar lymphadenopathy narrowing the airways at the left hilum. In the left lung only a subtle 9 mm spiculated nodule is identified in the  lingula. Consider small cell carcinoma. Bronchoscopy or mediastinal node sampling might be most valuable for diagnosis. 2. Indeterminate although low-density (which typically which indicates a benign adenoma) left adrenal nodule. 3.  Negative for acute pulmonary embolus.   08/24/2018 Definitive Surgery   Bronchoscopy and EBUS biopsy on 08/24/18 by Dr. Loanne Drilling   08/24/2018 Initial Biopsy   Diagnosis 08/24/18 FINE NEEDLE ASPIRATION, ENDOSCOPIC, (EBUS) STATION # 7 LYMPH NODE(SPECIMEN 1 OF 4 COLLECTED 08/24/18): NO MALIGNANT CELLS IDENTIFIED.  FINE NEEDLE ASPIRATION, ENDOSCOPIC, (EBUS) LEFT HILAR LYMPH NODE(SPECIMEN 2 OF 4 COLLECTED 08/24/18): NO MALIGNANT CELLS IDENTIFIED.  TRANSBRONCHIAL NEEDLE ASPIRATION, WANG, (SPECIMEN 3 OF 4 COLLECTED 08/24/18): NO MALIGNANT CELLS IDENTIFIED.  BRONCHIAL BRUSHING, LUL (SPECIMEN 4 OF 4 COLLECTED 08/24/18): MALIGNANT CELLS PRESENT, MOST CONSISTENT WITH SMALL CELL CARCINOMA. SEE COMMENT.   08/24/2018 Cancer Staging   Staging form: Lung, AJCC 8th Edition - Clinical stage from 08/24/2018: Stage IIIA (cT1a, cN2, cM0) - Signed by [name], MD on 10/17/2018   08/26/2018 Initial Diagnosis   Small cell lung cancer, left ([place])   08/26/2018 Imaging   CT AP W Contrast 08/26/18  IMPRESSION: 1. 2.9 cm left adrenal nodule cannot be definitively characterized on this study. Potentially a lipid poor adenoma, but metastatic disease not excluded. MRI of the abdomen with and without contrast may prove helpful to further evaluate. 2. No other findings to suggest metastatic disease in the abdomen/pelvis. 3. Left base atelectasis with small left pleural effusion.   08/27/2018 Imaging   MRI brain 08/27/18  IMPRESSION: 1. Motion degraded, incomplete examination. 2. Subcentimeter focus of diffusion signal  abnormality in the left cerebellum. No enhancement is evident to strongly suggest a metastasis, and this may reflect an acute to subacute small vessel infarct. 3. No  evidence of intracranial metastatic disease elsewhere. 4. Given motion artifact and incomplete postcontrast imaging, consider short-term follow-up MRI (possibly with sedation) to further evaluate the left cerebellar abnormality and provide a more thorough evaluation for metastatic disease.   09/02/2018 PET scan   IMPRESSION: 1. Hypermetabolic left hilar mass is identified encasing the left mainstem bronchus resulting in postobstructive atelectasis in the left upper lobe. 2. Hypermetabolic left mediastinal nodal metastasis. 3. Loculated left pleural effusion is identified with mild increased FDG uptake. Cannot rule out malignant pleural effusion. 4. No evidence for metastatic disease to the abdomen or pelvis or skeletal structures.     09/07/2018 - 11/10/2018 Chemotherapy   Concurrent chemoRT with IV cisplatin on day 1 and etopside day 1-3 for every 3 weeks starting 8/11. Will start radiation with Dr. Lisbeth Renshaw with cycle 2. She completed 4 cycles on 11/10/18   09/14/2018 Procedure   Thoracentesis yielding 750 mL   09/28/2018 - 12/09/2019 Radiation Therapy   Concurrent chemoRT with Dr. Lisbeth Renshaw. Start with cycle 2 chemo on 09/28/18 and completed on 12/09/18.    02/10/2019 Imaging   CT CAP W Contrast  IMPRESSION: 1. Interval response to therapy. Significant interval reduction in tumor burden involving the left hemithorax. 2. No new or progressive disease identified within the chest, abdomen or pelvis. 3. Emphysema and aortic atherosclerosis. 4. Stable left adrenal nodule.   Aortic Atherosclerosis (ICD10-I70.0) and Emphysema (ICD10-J43.9).   02/11/2019 Imaging   MRI Brain  IMPRESSION: 6 x 10 mm solitary metastatic deposit right frontal lobe with minimal edema.   02/28/2019 - 03/14/2019 Radiation Therapy   Target Brain Radiation with Dr. Lisbeth Renshaw    05/19/2019 Imaging   CT CAP w contrast  IMPRESSION: 1. Trace residual soft tissue thickening at the left lung apex and along the AP window.  Slight increase in size of a low left paratracheal lymph node. Continued attention on follow-up exams is warranted. No evidence of distant metastatic disease. 2. Subpleural density in the posterior left upper lobe is new and may be due to atelectasis. Recommend attention on follow-up. 3. Left adrenal adenoma. 4. Aortic atherosclerosis (ICD10-I70.0). Coronary artery calcification. 5.  Emphysema (ICD10-J43.9).   07/13/2019 Imaging   MRI brain  IMPRESSION: 1. Increased size of right frontal lobe metastasis with new moderate edema. 2. No evidence of new intracranial metastases.     08/10/2019 Imaging   CT CAP W Contrast  IMPRESSION: 1. New and progressive adenopathy within the mediastinum and low left neck, consistent with nodal metastasis. 2. No evidence of pulmonary metastasis. Similar areas of probable scarring and pleural thickening. 3. No subdiaphragmatic metastatic disease identified. 4. Left adrenal adenoma. 5. Age advanced aortic atherosclerosis (ICD10-I70.0) and emphysema (ICD10-J43.9).     08/19/2019 Imaging   Ct Angio Chest  IMPRESSION: 1. No evidence of pulmonary embolus. 2. Stable nodal metastasis within the mediastinum and left internal jugular chain. 3. Interval development of mild interlobular septal thickening and ground-glass opacity, greatest in the right upper lobe and left lower lobe. Differential includes asymmetric edema versus atypical infection. 4. Aortic Atherosclerosis (ICD10-I70.0) and Emphysema (ICD10-J43.9).     08/25/2019 Imaging   MRI Brain  IMPRESSION: Increased size of centrally necrotic enhancing right frontal lesion measuring 4.0 cm, previously 2.6 cm.   White matter edema and dural thickening/enhancement involving the right frontal convexity is more conspicuous than prior  exam.   No new enhancing lesion.   09/01/2019 Procedure   PAC placement    09/19/2019 Imaging   MRI brain  IMPRESSION: 1. Postoperative changes from interval  right frontal craniotomy for resection of previously identified right frontal lobe mass. Somewhat ill-defined parenchymal enhancement about the resection cavity favored to reflect postoperative changes. No definite residual tumor seen on this immediate postoperative scan. 2. Small 1.2 cm infarct at the deep margin of the resection cavity. 3. Interval increase in T2/FLAIR signal intensity involving the adjacent right frontal region with increased regional mass effect and up to 6 mm of right-to-left shift.   09/19/2019 Surgery   Right CRANIOTOMY TUMOR EXCISION, FRONTAL and APPLICATION OF CRANIAL NAVIGATION by Dr Kathyrn Sheriff   09/19/2019 Pathology Results   FINAL MICROSCOPIC DIAGNOSIS:   A. BRAIN TUMOR, RIGHT FRONTAL, EXCISION:  - Small cell carcinoma.   B. BRAIN TUMOR, RIGHT FRONTAL DURA, EXCISION:  - Small cell carcinoma.    COMMENT:   CK8/18 (dot-like), Synaptophysin and Chromogranin are positive.  CKAE1AE3, CK7 and Chromogranin are noncontributory.  Ki-67 proliferation  index reaches 70-80%.  The morphology and immunophenotype are compatible  with the provided clinical history of small cell carcinoma.  Dr. Melina Copa  reviewed.    10/27/2019 Imaging   IMPRESSION: 1. Significant interval enlargement of bulky mediastinal and left hilar lymphadenopathy. There are new pleural nodules or prevascular lymph nodes. 2. Significant interval increase in masslike consolidation about the left hilum. There is interlobular septal thickening and scattered ground-glass and heterogeneous airspace opacity throughout the left lung, predominantly involving the left upper lobe and concerning for lymphangitic spread and postobstructive airspace disease. 3. There is somewhat nodular pleural thickening about the medial left upper lobe and a new small left pleural effusion. 4. Findings are consistent with worsened lung malignancy with pleural and nodal metastatic disease. 5. No significant interval change  in a left adrenal mass, likely an incidental adenoma. Attention on follow-up. 6. No definite evidence of distant metastatic disease within the abdomen or pelvis. 7. Emphysema (ICD10-J43.9). 8. Aortic Atherosclerosis (ICD10-I70.0)   11/07/2019 -  Chemotherapy   First-line chemo Carboplatin day 1, Etopside day 1-3 with immunotherapy Tecentriq day 1 q3weeks starting 11/07/19   11/18/2019 Imaging   MRI Brain  IMPRESSION: 1. New heterogenously enhancing right anterior and middle cranial fossa tumor measuring 6.7 x 5.2 x 6.2 cm. 2. Increased leftward midline shift of 0.9 cm with leftward displacement of the ACAs. The ACAs remain patent. 3. New mild mass effect upon the right midbrain. 4. Confluent T2/FLAIR hyperintense right cerebrum signal is increased since prior exam. 5. Multifocal dehiscence involving the right anterior cranial fossa floor with extension of tumor into the superior ethmoid and superior right orbit.      REVIEW OF SYSTEMS:   Unable to obtain secondary to patient condition.  I have reviewed the past medical history, past surgical history, social history and family history with the patient and they are unchanged from previous note.   PHYSICAL EXAMINATION: ECOG PERFORMANCE STATUS: 4 - Bedbound  Vitals:   12/30/19 0159 12/30/19 0457  BP: (!) 112/93 (!) 149/67  Pulse: 91 66  Resp: 16 16  Temp: 98.2 F (36.8 C) 98.1 F (36.7 C)  SpO2: 100% 98%   Filed Weights   12/29/19 1827  Weight: 67.1 kg    Intake/Output from previous day: 12/02 0701 - 12/03 0700 In: -  Out: 300 [Urine:300]  GENERAL: Chronically ill-appearing female, no distress SKIN: skin color, texture, turgor are normal, no  rashes or significant lesions LUNGS: clear to auscultation and percussion with normal breathing effort HEART: regular rate & rhythm and no murmurs and no lower extremity edema ABDOMEN:abdomen soft, non-tender and normal bowel sounds NEURO: Somnolent, opens eyes and can  answer questions briefly  LABORATORY DATA:  I have reviewed the data as listed CMP Latest Ref Rng & Units 12/30/2019 12/29/2019 11/16/2019  Glucose 70 - 99 mg/dL 113(H) 121(H) 122(H)  BUN 6 - 20 mg/dL 22(H) 25(H) 25(H)  Creatinine 0.44 - 1.00 mg/dL 0.54 0.57 0.91  Sodium 135 - 145 mmol/L 141 143 142  Potassium 3.5 - 5.1 mmol/L 3.2(L) 3.1(L) 3.5  Chloride 98 - 111 mmol/L 105 102 106  CO2 22 - 32 mmol/L '23 27 27  ' Calcium 8.9 - 10.3 mg/dL 9.6 9.9 10.0  Total Protein 6.5 - 8.1 g/dL 7.0 7.9 7.4  Total Bilirubin 0.3 - 1.2 mg/dL 0.3 0.4 0.3  Alkaline Phos 38 - 126 U/L 70 80 90  AST 15 - 41 U/L '22 22 23  ' ALT 0 - 44 U/L 29 32 40    Lab Results  Component Value Date   WBC 10.2 12/30/2019   HGB 9.4 (L) 12/30/2019   HCT 31.8 (L) 12/30/2019   MCV 90.9 12/30/2019   PLT 519 (H) 12/30/2019   NEUTROABS 9.3 (H) 12/29/2019    CT Head Wo Contrast  Result Date: 12/29/2019 CLINICAL DATA:  Known brain metastases. Worsening headache, nausea vomiting. Failure to thrive. History of lung carcinoma. EXAM: CT HEAD WITHOUT CONTRAST TECHNIQUE: Contiguous axial images were obtained from the base of the skull through the vertex without intravenous contrast. COMPARISON:  Brain MRI, 12/09/2019. FINDINGS: Brain: There is heterogeneous attenuation along the anterior inferior right frontal lobe deep to a craniotomy flap. There is significant vasogenic edema extending throughout the right frontal lobe into the adjacent right parietal and temporal lobes, with significant mass effect. There is near complete effacement of the right lateral ventricle. Midline shift to the left measures 1.4 cm. There is partial effacement of the right basilar cisterns without herniation. No evidence of acute infarction. No intracranial hemorrhage. No extra-axial masses or fluid collections. Vascular: No hyperdense vessel or unexpected calcification. Skull: No fracture. No bone lesion. Right frontal craniotomy flap is well aligned. Sinuses/Orbits:  Soft tissue mass in the superior extraconal right orbit broadly abutting the superior orbital wall, measuring 1.8 x 1.2 x 1.9 cm. This depresses the underlying superior rectus muscle. No right intraconal orbital abnormality. No abnormality of the right globe. Normal left globe and orbit. Visualized sinuses are clear. Other: None. IMPRESSION: 1. Vasogenic edema with significant right-sided mass effect from the known right frontotemporal brain masses. The masses are not well-defined on CT. The degree of mass effect has increased, with the right lateral ventricle now mostly effaced and midline shift to the left increased to 1.4 cm. 2. There is a right superior orbital, extraconal mass consistent with metastatic disease versus contiguous spread. No skull defect is evident. This mass has increased in size from the prior brain MRI. 3. No evidence of acute infarction and no intracranial hemorrhage. Electronically Signed   By: Lajean Manes M.D.   On: 12/29/2019 12:45   MR Brain W Wo Contrast  Result Date: 12/09/2019 CLINICAL DATA:  54 year old female with metastatic small cell lung cancer. Whole brain radiation completed in February. Systemic progression in September. Progressive right frontal lobe lesion with edema this summer, status post craniotomy and resection in August, pathology revealing small cell carcinoma. And then substantial  progression of disease in the anterior right hemisphere last month. EXAM: MRI HEAD WITHOUT AND WITH CONTRAST TECHNIQUE: Multiplanar, multiecho pulse sequences of the brain and surrounding structures were obtained without and with intravenous contrast. CONTRAST:  20m MULTIHANCE GADOBENATE DIMEGLUMINE 529 MG/ML IV SOLN COMPARISON:  Brain MRI 11/18/2019 and earlier. FINDINGS: Brain: Bulky and lobulated, confluent enhancing tumor from the right anterior temporal tip through to the anterior inferior right frontal gyrus encompassing an area of 71 x 49 x 51 mm (AP by transverse by CC).  Compared to last month and measured in the same way tumor size and configuration has not significantly changed (71 x 45 x 53 mm). Enhancement pattern is stable allowing for differences in technique. Tumor extends to the surface of the brain as before, but without additional superimposed meningeal thickening or enhancement. Extensive T2 and FLAIR hyperintensity throughout the anterior right frontal lobe, tracking through the deep white matter capsules into the right temporal lobe most likely is confluent vasogenic edema and not significantly changed. Intracranial mass effect with leftward midline shift has mildly improved to 5 to 6 mm (previously up to 9 mm). Basilar cisterns are normal. No ventriculomegaly. No other abnormal intracranial enhancement identified. No superimposed restricted diffusion suggestive of acute infarction. Noextra-axial fluid collection or acute intracranial hemorrhage. Cervicomedullary junction and pituitary are within normal limits. Stable gray and white matter signal outside of the right hemisphere. Vascular: Major intracranial vascular flow voids are stable. Skull and upper cervical spine: Negative visible cervical spine. Sequelae of anterior right frontal craniotomy. No superimposed bony metastatic disease identified. Sinuses/Orbits: Optic chiasm and orbits soft tissues remain within normal limits. Paranasal Visualized paranasal sinuses and mastoids are stable and well pneumatized. Other: Visible internal auditory structures appear normal. Stable scalp. IMPRESSION: 1. Bulky, enhancing tumor in the anterior right hemisphere has not significantly changed since 11/18/2019. Stable extensive vasogenic edema. But intracranial mass effect and midline shift (now 5-6 mm) have improved. 2. No new metastatic disease or new intracranial abnormality identified. Electronically Signed   By: HGenevie AnnM.D.   On: 12/09/2019 23:46   DG Chest Portable 1 View  Result Date: 12/29/2019 CLINICAL DATA:   Headache nausea vomiting and fatigue. EXAM: PORTABLE CHEST 1 VIEW COMPARISON:  Chest radiograph August 19, 2019 and chest CT October 27, 2019 FINDINGS: Right chest wall porta catheter with tip overlying the superior cavoatrial junction. Unchanged heart size. Bulky mediastinal adenopathy similar to chest CT. Similar left upper lobe perihilar mass like consolidation with interlobular septal thickening. No new focal consolidation. No visible pneumothorax or pleural effusion. The visualized skeletal structures are unchanged. IMPRESSION: 1. Similar left upper lobe perihilar mass like consolidation with adjacent interlobular septal thickening. No new focal consolidation. Electronically Signed   By: JDahlia BailiffMD   On: 12/29/2019 11:15    ASSESSMENT AND PLAN: 1.  Metastatic small cell lung cancer with brain metastasis 2.  Altered mental status 3.  Nausea and vomiting 4.  Anemia 5.  Thrombocytosis 6.  Hypokalemia 7.  Goals of care  -Unfortunately, the patient has continued disease progression of her small cell lung cancer.  She also has a history of not following up for recommended systemic treatment and brain radiation.  Would recommend consideration of transition to comfort measures/hospice. -Continue dexamethasone and as needed antiemetics. -Recommend palliative care consult for discussion of goals of care.  Patient is DNR/DNI.   LOS: 0 days   KMikey Bussing DNP, AGPCNP-BC, AOCNP 12/30/19  Addendum  I have seen the patient, examined  her. I agree with the assessment and and plan and have edited the notes.   I saw the patient in the late afternoon, she was laying bed comfortably, but lethargic, was only able to open her eyes and say a few words to me.  She received 1 dose of Haldol this morning, no other sedating medicines since then. She was not able to carry a meaningful conversation.    I called her boyfriend Marissa [name].  Marissa [name] understands her rapid decline is related to her cancer  progression, and she is terminal.  I recommend comfort care and residential hospice, Marissa [name] is in complete agreement. Per Marissa [name], pt had discussed life support with him before and wishes to be DNR. Marissa [name] will come in tomorrow, to meet Dr. Starla Link and transition pt to hospice and comfort care. I encouraged Marissa [name] to call pt's son and let him know to see if he wants to come in.   [name]  12/30/2019 6:25 PM

## 2019-12-30 NOTE — Progress Notes (Signed)
Patient ID: Marissa [name], female   DOB: 05-15-1965, 54 y.o.   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  On presentation, CT of the head showed brain mets with worsening vasogenic edema and mass-effect.  ED provider spoke with neurosurgery who recommended no surgical intervention and to continue steroids and recommended palliative care consult.  Assessment & Plan:   Headache with nausea and vomiting due to worsening vasogenic brain edema and mass-effect History of small cell lung cancer with brain metastases -Presented with headache, nausea and altered mental status -CT of the head showed brain mets with worsening vasogenic edema and mass-effect.  ED provider spoke with neurosurgery who recommended no surgical intervention and to continue steroids and recommended palliative care consult. -Palliative care consultation pending. -Patient has had intermittent agitation.  Will use Haldol and Ativan as needed. -Overall prognosis is very poor.  Awaiting oncology evaluation as well. -Recommend hospice/total comfort measures  Anemia of chronic disease -From cancer.  Thrombocytosis -Probably reactive.  Hypokalemia -Replace.   DVT prophylaxis: SCDs Code Status: DNR  family Communication: None at bedside Disposition Plan: Status is: Inpatient  Remains inpatient appropriate because:Inpatient level of care appropriate due to severity of illness   Dispo: The patient is from: Home              Anticipated d/c is to: Home.  Might benefit from hospice              Anticipated d/c date is: 1 day if family is agreeable for home hospice              Patient currently is not medically stable to d/c.   Consultants: ED provider spoke to neurosurgery.  Oncology/palliative  care  Procedures: None  Antimicrobials: None   Subjective: Patient seen and examined at bedside.  She is awake, slow to respond and confused to time.  Nursing staff reported episodes of agitation earlier this morning requiring Haldol.  Objective: Vitals:   12/29/19 2102 12/30/19 0159 12/30/19 0457 12/30/19 0946  BP: (!) 153/92 (!) 112/93 (!) 149/67 (!) 154/67  Pulse: 79 91 66 77  Resp: 18 16 16 14   Temp: 97.8 F (36.6 C) 98.2 F (36.8 C) 98.1 F (36.7 C) 98.5 F (36.9 C)  TempSrc: Oral   Oral  SpO2: 99% 100% 98% 97%  Weight:      Height:        Intake/Output Summary (Last 24 hours) at 12/30/2019 1244 Last data filed at 12/30/2019 0947 Gross per 24 hour  Intake 0 ml  Output 400 ml  Net -400 ml   Filed Weights   12/29/19 1827  Weight: 67.1 kg    Examination:  General exam: Appears chronically ill.  Very poor historian.  No distress. Respiratory system: Bilateral decreased breath sounds at bases Cardiovascular system: S1 & S2 heard, Rate controlled Gastrointestinal system: Abdomen is nondistended, soft and nontender. Normal bowel sounds heard. Extremities: No cyanosis, clubbing; trace lower extremity edema Central nervous system: Awake, confused to time, very slow to respond.  No focal neurological deficits. Moving extremities Skin: No obvious ecchymosis/lesions Psychiatry: Cannot be assessed because of mental status   Data Reviewed: I have personally reviewed following labs and imaging studies  CBC: Recent Labs  Lab 12/29/19 1104 12/30/19 0514  WBC 10.2 10.2  NEUTROABS 9.3*  --   HGB 10.0* 9.4*  HCT 33.3* 31.8*  MCV 89.0 90.9  PLT 534* 937*   Basic Metabolic Panel: Recent Labs  Lab 12/29/19 1104 12/30/19 0514  NA 143 141  K 3.1* 3.2*  CL 102 105  CO2 27 23  GLUCOSE 121* 113*  BUN 25* 22*  CREATININE 0.57 0.54  CALCIUM 9.9 9.6   GFR: Estimated Creatinine Clearance: 73.1 mL/min (by C-G formula based on SCr of 0.54 mg/dL). Liver Function  Tests: Recent Labs  Lab 12/29/19 1104 12/30/19 0514  AST 22 22  ALT 32 29  ALKPHOS 80 70  BILITOT 0.4 0.3  PROT 7.9 7.0  ALBUMIN 4.1 3.8   Recent Labs  Lab 12/29/19 1104  LIPASE 22   No results for input(s): AMMONIA in the last 168 hours. Coagulation Profile: No results for input(s): INR, PROTIME in the last 168 hours. Cardiac Enzymes: No results for input(s): CKTOTAL, CKMB, CKMBINDEX, TROPONINI in the last 168 hours. BNP (last 3 results) No results for input(s): PROBNP in the last 8760 hours. HbA1C: No results for input(s): HGBA1C in the last 72 hours. CBG: No results for input(s): GLUCAP in the last 168 hours. Lipid Profile: No results for input(s): CHOL, HDL, LDLCALC, TRIG, CHOLHDL, LDLDIRECT in the last 72 hours. Thyroid Function Tests: No results for input(s): TSH, T4TOTAL, FREET4, T3FREE, THYROIDAB in the last 72 hours. Anemia Panel: No results for input(s): VITAMINB12, FOLATE, FERRITIN, TIBC, IRON, RETICCTPCT in the last 72 hours. Sepsis Labs: Recent Labs  Lab 12/29/19 1104  LATICACIDVEN 1.2    Recent Results (from the past 240 hour(s))  Resp Panel by RT-PCR (Flu A&B, Covid) Nasopharyngeal Swab     Status: None   Collection Time: 12/29/19 11:04 AM   Specimen: Nasopharyngeal Swab; Nasopharyngeal(NP) swabs in vial transport medium  Result Value Ref Range Status   SARS Coronavirus 2 by RT PCR NEGATIVE NEGATIVE Final    Comment: (NOTE) SARS-CoV-2 target nucleic acids are NOT DETECTED.  The SARS-CoV-2 RNA is generally detectable in upper respiratory specimens during the acute phase of infection. The lowest concentration of SARS-CoV-2 viral copies this assay can detect is 138 copies/mL. A negative result does not preclude SARS-Cov-2 infection and should not be used as the sole basis for treatment or other patient management decisions. A negative result may occur with  improper specimen collection/handling, submission of specimen other than nasopharyngeal  swab, presence of viral mutation(s) within the areas targeted by this assay, and inadequate number of viral copies(<138 copies/mL). A negative result must be combined with clinical observations, patient history, and epidemiological information. The expected result is Negative.  Fact Sheet for Patients:  EntrepreneurPulse.com.au  Fact Sheet for Healthcare Providers:  IncredibleEmployment.be  This test is no t yet approved or cleared by the [place] FDA and  has been authorized for detection and/or diagnosis of SARS-CoV-2 by FDA under an Emergency Use Authorization (EUA). This EUA will remain  in effect (meaning this test can be used) for the duration of the COVID-19 declaration under Section 564(b)(1) of the Act, 21 U.S.C.section 360bbb-3(b)(1), unless the authorization is terminated  or revoked sooner.       Influenza A by PCR NEGATIVE NEGATIVE Final   Influenza B by PCR NEGATIVE NEGATIVE Final    Comment: (NOTE) The Xpert Xpress SARS-CoV-2/FLU/RSV plus assay is intended as an aid in the diagnosis of influenza from Nasopharyngeal swab specimens and should not be used as a sole basis for treatment.  Nasal washings and aspirates are unacceptable for Xpert Xpress SARS-CoV-2/FLU/RSV testing.  Fact Sheet for Patients: EntrepreneurPulse.com.au  Fact Sheet for Healthcare Providers: IncredibleEmployment.be  This test is not yet approved or cleared by the [place] FDA and has been authorized for detection and/or diagnosis of SARS-CoV-2 by FDA under an Emergency Use Authorization (EUA). This EUA will remain in effect (meaning this test can be used) for the duration of the COVID-19 declaration under Section 564(b)(1) of the Act, 21 U.S.C. section 360bbb-3(b)(1), unless the authorization is terminated or revoked.  Performed at [facility], [place] [address], [place], [place] [address]           Radiology Studies: CT Head Wo Contrast  Result Date: 12/29/2019 CLINICAL DATA:  Known brain metastases. Worsening headache, nausea vomiting. Failure to thrive. History of lung carcinoma. EXAM: CT HEAD WITHOUT CONTRAST TECHNIQUE: Contiguous axial images were obtained from the base of the skull through the vertex without intravenous contrast. COMPARISON:  Brain MRI, 12/09/2019. FINDINGS: Brain: There is heterogeneous attenuation along the anterior inferior right frontal lobe deep to a craniotomy flap. There is significant vasogenic edema extending throughout the right frontal lobe into the adjacent right parietal and temporal lobes, with significant mass effect. There is near complete effacement of the right lateral ventricle. Midline shift to the left measures 1.4 cm. There is partial effacement of the right basilar cisterns without herniation. No evidence of acute infarction. No intracranial hemorrhage. No extra-axial masses or fluid collections. Vascular: No hyperdense vessel or unexpected calcification. Skull: No fracture. No bone lesion. Right frontal craniotomy flap is well aligned. Sinuses/Orbits: Soft tissue mass in the superior extraconal right orbit broadly abutting the superior orbital wall, measuring 1.8 x 1.2 x 1.9 cm. This depresses the underlying superior rectus muscle. No right intraconal orbital abnormality. No abnormality of the right globe. Normal left globe and orbit. Visualized sinuses are clear. Other: None. IMPRESSION: 1. Vasogenic edema with significant right-sided mass effect from the known right frontotemporal brain masses. The masses are not well-defined on CT. The degree of mass effect has increased, with the right lateral ventricle now mostly effaced and midline shift to the left increased to 1.4 cm. 2. There is a right superior orbital, extraconal mass consistent with metastatic disease versus contiguous spread. No skull defect is evident. This mass has increased in size  from the prior brain MRI. 3. No evidence of acute infarction and no intracranial hemorrhage. Electronically Signed   By: Lajean Manes M.D.   On: 12/29/2019 12:45   DG Chest Portable 1 View  Result Date: 12/29/2019 CLINICAL DATA:  Headache nausea vomiting and fatigue. EXAM: PORTABLE CHEST 1 VIEW COMPARISON:  Chest radiograph August 19, 2019 and chest CT October 27, 2019 FINDINGS: Right chest wall porta catheter with tip overlying the superior cavoatrial junction. Unchanged heart size. Bulky mediastinal adenopathy similar to chest CT. Similar left upper lobe perihilar mass like consolidation with interlobular septal thickening. No new focal consolidation. No visible pneumothorax or pleural effusion. The visualized skeletal structures are unchanged. IMPRESSION: 1. Similar left upper lobe perihilar mass like consolidation with adjacent interlobular septal thickening. No new focal consolidation. Electronically Signed   By: Dahlia Bailiff MD   On: 12/29/2019 11:15        Scheduled Meds: . Chlorhexidine Gluconate Cloth  6 each Topical Daily  . dexamethasone (DECADRON) injection  4 mg Intravenous Q12H  . potassium chloride  40 mEq Oral Once  . potassium chloride  40 mEq Oral Daily  .  sodium chloride flush  10-40 mL Intracatheter Q12H   Continuous Infusions:        Aline August, MD [facility] 12/30/2019, 12:44 PM

## 2019-12-31 DIAGNOSIS — R4182 Altered mental status, unspecified: Secondary | ICD-10-CM | POA: Diagnosis not present

## 2019-12-31 DIAGNOSIS — G936 Cerebral edema: Secondary | ICD-10-CM | POA: Diagnosis not present

## 2019-12-31 DIAGNOSIS — C3412 Malignant neoplasm of upper lobe, left bronchus or lung: Secondary | ICD-10-CM

## 2019-12-31 DIAGNOSIS — R112 Nausea with vomiting, unspecified: Secondary | ICD-10-CM

## 2019-12-31 DIAGNOSIS — E876 Hypokalemia: Secondary | ICD-10-CM

## 2019-12-31 DIAGNOSIS — D649 Anemia, unspecified: Secondary | ICD-10-CM

## 2019-12-31 DIAGNOSIS — C7931 Secondary malignant neoplasm of brain: Secondary | ICD-10-CM | POA: Diagnosis not present

## 2019-12-31 DIAGNOSIS — Z66 Do not resuscitate: Secondary | ICD-10-CM

## 2019-12-31 MED ORDER — HYDROMORPHONE HCL 1 MG/ML IJ SOLN
1.0000 mg | INTRAMUSCULAR | Status: DC | PRN
  Administered 2019-12-31 – 2020-01-01 (×2): 2 mg via INTRAVENOUS
  Filled 2019-12-31 (×2): qty 2

## 2019-12-31 MED ORDER — DIAZEPAM 10 MG PO TABS: 10.0000 mg | ORAL_TABLET | Freq: Three times a day (TID) | ORAL | Status: AC

## 2019-12-31 MED ORDER — OXYCODONE HCL 10 MG PO TABS: 10.0000 mg | ORAL_TABLET | Freq: Three times a day (TID) | ORAL | Status: AC

## 2019-12-31 MED ORDER — LORAZEPAM 2 MG/ML IJ SOLN
1.0000 mg | INTRAMUSCULAR | Status: DC | PRN
  Administered 2020-01-01: 15:00:00 2 mg via INTRAVENOUS
  Filled 2019-12-31: qty 1

## 2019-12-31 MED ORDER — OXYCODONE HCL 10 MG PO TABS: 10.0000 mg | ORAL_TABLET | ORAL | Status: AC | PRN

## 2019-12-31 MED ORDER — OXYCODONE HCL 5 MG PO TABS
10.0000 mg | ORAL_TABLET | ORAL | Status: DC | PRN
  Administered 2019-12-31 – 2020-01-01 (×2): 10 mg via ORAL
  Filled 2019-12-31 (×2): qty 2

## 2019-12-31 MED ORDER — DIAZEPAM 5 MG PO TABS
10.0000 mg | ORAL_TABLET | Freq: Three times a day (TID) | ORAL | Status: DC
  Administered 2019-12-31 – 2020-01-01 (×4): 10 mg via ORAL
  Filled 2019-12-31 (×4): qty 2

## 2019-12-31 MED ORDER — OXYCODONE HCL 5 MG PO TABS
10.0000 mg | ORAL_TABLET | Freq: Three times a day (TID) | ORAL | Status: DC
  Administered 2019-12-31 – 2020-01-01 (×4): 10 mg via ORAL
  Filled 2019-12-31 (×4): qty 2

## 2019-12-31 NOTE — Progress Notes (Signed)
Pt's restraints are d/c at this time.

## 2019-12-31 NOTE — Progress Notes (Signed)
SLP Cancellation Note  Patient Details Name: Marissa [name] MRN: [id number] DOB: [birth year]   Cancelled treatment:       Reason Eval/Treat Not Completed: Other (comment) (Patient is on clear liquids diet and awaiting discharge for residential hospice; MD in agreement to not proceed with BSE at this time)   Sonia Baller, MA, CCC-SLP Speech Therapy

## 2019-12-31 NOTE — Progress Notes (Signed)
The Pt has pull out her port, no IV access at this time,order placed for a new IV. I will continue to monitor.

## 2019-12-31 NOTE — Consult Note (Signed)
Palliative Medicine  Name: Marissa [name] Date: 12/31/2019 MRN: [id number]  DOB: 02/09/1965  Patient Care Team: [name], MD as PCP - General (Hematology)    REASON FOR CONSULTATION: Marissa [name] is a 54 y.o. 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 She completed XRT and underwent right craniotomy with tumor resection on 09/19/2019.   CTs of the chest, abdomen, and pelvis on 10/27/2019 revealed significant progression of her pleural and nodal metastatic disease. MRI of the brain on 11/16/2019 revealed new right anterior/middle cranial fossa tumor with left midline shift and mass-effect on the right midbrain. Patient was restarted on chemo/immunotherapy with carbo/etoposide/Tecentriq and received cycle 1 on 11/07/2019. Plan was also for her to start whole brain radiation given recurrent disease. Patient was then lost to follow-up for further treatments.   Patient was admitted to the hospital 12/29/2019 with altered mental status, headache, and nausea vomiting. CT of the head revealed severe vasogenic edema with significant right-sided mass-effect from the known right frontotemporal brain mass. Patient was started on steroids with plan for conservative management due to her overall poor prognosis.   Palliative care was consulted to help address goals and manage ongoing symptoms.   SOCIAL HISTORY:     reports that she has been smoking cigarettes. She started smoking about 40 years ago. She has a 78.00 pack-year smoking history. She has never used smokeless tobacco. She reports current alcohol use. She reports current drug use. Drug: Marijuana.  Patient is unmarried. She lives at home with her significant other Aaron Edelman) and a friend. She and Aaron Edelman have been together for 30  years. Patient has a son from whom she reportedly has limited contact.  ADVANCE DIRECTIVES:  Not on file  CODE STATUS: DNR  PAST MEDICAL HISTORY: Past Medical History:  Diagnosis Date  . Asthma   . Chronic headaches   . COPD (chronic obstructive pulmonary disease) ([place])   . Depression   . Dyspnea    hospitalized July 2020  . Heart murmur   . Small cell lung cancer ([place])     PAST SURGICAL HISTORY:  Past Surgical History:  Procedure Laterality Date  . APPLICATION OF CRANIAL NAVIGATION Right 09/19/2019   Procedure: APPLICATION OF CRANIAL NAVIGATION;  Surgeon: Consuella Lose, MD;  Location: [place];  Service: Neurosurgery;  Laterality: Right;  APPLICATION OF CRANIAL NAVIGATION  . BRONCHIAL BIOPSY  08/24/2018   Procedure: BRONCHIAL BIOPSIES;  Surgeon: Margaretha Seeds, MD;  Location: [facility];  Service: Cardiopulmonary;;  . BRONCHIAL BRUSHINGS  08/24/2018   Procedure: BRONCHIAL BRUSHINGS;  Surgeon: Margaretha Seeds, MD;  Location: [facility];  Service: Cardiopulmonary;;  . BRONCHIAL NEEDLE ASPIRATION BIOPSY  08/24/2018   Procedure: BRONCHIAL NEEDLE ASPIRATION BIOPSIES;  Surgeon: Margaretha Seeds, MD;  Location: [facility];  Service: Cardiopulmonary;;  . CRANIOTOMY Right 09/19/2019   Procedure: CRANIOTOMY TUMOR EXCISION, FRONTAL;  Surgeon: Consuella Lose, MD;  Location: [place];  Service: Neurosurgery;  Laterality: Right;  CRANIOTOMY TUMOR EXCISION, FRONTAL  . ENDOBRONCHIAL ULTRASOUND N/A 08/24/2018   Procedure: ENDOBRONCHIAL ULTRASOUND;  Surgeon: Margaretha Seeds, MD;  Location: [facility];  Service: Cardiopulmonary;  Laterality: N/A;  . IR IMAGING GUIDED PORT INSERTION  09/01/2019  . IR THORACENTESIS ASP PLEURAL SPACE W/IMG GUIDE  09/14/2018  . TUBAL LIGATION    . VIDEO  BRONCHOSCOPY  08/24/2018   Procedure: VIDEO BRONCHOSCOPY;  Surgeon: Margaretha Seeds, MD;  Location: [facility];  Service: Cardiopulmonary;;    HEMATOLOGY/ONCOLOGY HISTORY:  Oncology History Overview  Note  Cancer Staging Small cell lung cancer, left upper lobe ([place]) Staging form: Lung, AJCC 8th Edition - Clinical stage from 08/24/2018: Stage IIIA (cT1a, cN2, cM0) - Signed by [name], MD on 10/17/2018    Small cell lung cancer, left upper lobe ([place])  07/29/2018 Imaging   CT Angio Chest 07/29/18  IMPRESSION: 1. Malignant appearing left mediastinal and hilar lymphadenopathy narrowing the airways at the left hilum. In the left lung only a subtle 9 mm spiculated nodule is identified in the lingula. Consider small cell carcinoma. Bronchoscopy or mediastinal node sampling might be most valuable for diagnosis. 2. Indeterminate although low-density (which typically which indicates a benign adenoma) left adrenal nodule. 3.  Negative for acute pulmonary embolus.   08/24/2018 Definitive Surgery   Bronchoscopy and EBUS biopsy on 08/24/18 by Dr. Loanne Drilling   08/24/2018 Initial Biopsy   Diagnosis 08/24/18 FINE NEEDLE ASPIRATION, ENDOSCOPIC, (EBUS) STATION # 7 LYMPH NODE(SPECIMEN 1 OF 4 COLLECTED 08/24/18): NO MALIGNANT CELLS IDENTIFIED.  FINE NEEDLE ASPIRATION, ENDOSCOPIC, (EBUS) LEFT HILAR LYMPH NODE(SPECIMEN 2 OF 4 COLLECTED 08/24/18): NO MALIGNANT CELLS IDENTIFIED.  TRANSBRONCHIAL NEEDLE ASPIRATION, WANG, (SPECIMEN 3 OF 4 COLLECTED 08/24/18): NO MALIGNANT CELLS IDENTIFIED.  BRONCHIAL BRUSHING, LUL (SPECIMEN 4 OF 4 COLLECTED 08/24/18): MALIGNANT CELLS PRESENT, MOST CONSISTENT WITH SMALL CELL CARCINOMA. SEE COMMENT.   08/24/2018 Cancer Staging   Staging form: Lung, AJCC 8th Edition - Clinical stage from 08/24/2018: Stage IIIA (cT1a, cN2, cM0) - Signed by [name], MD on 10/17/2018   08/26/2018 Initial Diagnosis   Small cell lung cancer, left ([place])   08/26/2018 Imaging   CT AP W Contrast 08/26/18  IMPRESSION: 1. 2.9 cm left adrenal nodule cannot be definitively characterized on this study. Potentially a lipid poor adenoma, but metastatic disease not excluded. MRI of the abdomen with and without  contrast may prove helpful to further evaluate. 2. No other findings to suggest metastatic disease in the abdomen/pelvis. 3. Left base atelectasis with small left pleural effusion.   08/27/2018 Imaging   MRI brain 08/27/18  IMPRESSION: 1. Motion degraded, incomplete examination. 2. Subcentimeter focus of diffusion signal abnormality in the left cerebellum. No enhancement is evident to strongly suggest a metastasis, and this may reflect an acute to subacute small vessel infarct. 3. No evidence of intracranial metastatic disease elsewhere. 4. Given motion artifact and incomplete postcontrast imaging, consider short-term follow-up MRI (possibly with sedation) to further evaluate the left cerebellar abnormality and provide a more thorough evaluation for metastatic disease.   09/02/2018 PET scan   IMPRESSION: 1. Hypermetabolic left hilar mass is identified encasing the left mainstem bronchus resulting in postobstructive atelectasis in the left upper lobe. 2. Hypermetabolic left mediastinal nodal metastasis. 3. Loculated left pleural effusion is identified with mild increased FDG uptake. Cannot rule out malignant pleural effusion. 4. No evidence for metastatic disease to the abdomen or pelvis or skeletal structures.     09/07/2018 - 11/10/2018 Chemotherapy   Concurrent chemoRT with IV cisplatin on day 1 and etopside day 1-3 for every 3 weeks starting 8/11. Will start radiation with Dr. Lisbeth Renshaw with cycle 2. She completed 4 cycles on 11/10/18   09/14/2018 Procedure   Thoracentesis yielding 750 mL   09/28/2018 - 12/09/2019 Radiation Therapy   Concurrent chemoRT with Dr. Lisbeth Renshaw. Start with cycle 2 chemo on 09/28/18 and completed on  12/09/18.    02/10/2019 Imaging   CT CAP W Contrast  IMPRESSION: 1. Interval response to therapy. Significant interval reduction in tumor burden involving the left hemithorax. 2. No new or progressive disease identified within the chest, abdomen or pelvis. 3.  Emphysema and aortic atherosclerosis. 4. Stable left adrenal nodule.   Aortic Atherosclerosis (ICD10-I70.0) and Emphysema (ICD10-J43.9).   02/11/2019 Imaging   MRI Brain  IMPRESSION: 6 x 10 mm solitary metastatic deposit right frontal lobe with minimal edema.   02/28/2019 - 03/14/2019 Radiation Therapy   Target Brain Radiation with Dr. Lisbeth Renshaw    05/19/2019 Imaging   CT CAP w contrast  IMPRESSION: 1. Trace residual soft tissue thickening at the left lung apex and along the AP window. Slight increase in size of a low left paratracheal lymph node. Continued attention on follow-up exams is warranted. No evidence of distant metastatic disease. 2. Subpleural density in the posterior left upper lobe is new and may be due to atelectasis. Recommend attention on follow-up. 3. Left adrenal adenoma. 4. Aortic atherosclerosis (ICD10-I70.0). Coronary artery calcification. 5.  Emphysema (ICD10-J43.9).   07/13/2019 Imaging   MRI brain  IMPRESSION: 1. Increased size of right frontal lobe metastasis with new moderate edema. 2. No evidence of new intracranial metastases.     08/10/2019 Imaging   CT CAP W Contrast  IMPRESSION: 1. New and progressive adenopathy within the mediastinum and low left neck, consistent with nodal metastasis. 2. No evidence of pulmonary metastasis. Similar areas of probable scarring and pleural thickening. 3. No subdiaphragmatic metastatic disease identified. 4. Left adrenal adenoma. 5. Age advanced aortic atherosclerosis (ICD10-I70.0) and emphysema (ICD10-J43.9).     08/19/2019 Imaging   Ct Angio Chest  IMPRESSION: 1. No evidence of pulmonary embolus. 2. Stable nodal metastasis within the mediastinum and left internal jugular chain. 3. Interval development of mild interlobular septal thickening and ground-glass opacity, greatest in the right upper lobe and left lower lobe. Differential includes asymmetric edema versus atypical infection. 4. Aortic  Atherosclerosis (ICD10-I70.0) and Emphysema (ICD10-J43.9).     08/25/2019 Imaging   MRI Brain  IMPRESSION: Increased size of centrally necrotic enhancing right frontal lesion measuring 4.0 cm, previously 2.6 cm.   White matter edema and dural thickening/enhancement involving the right frontal convexity is more conspicuous than prior exam.   No new enhancing lesion.   09/01/2019 Procedure   PAC placement    09/19/2019 Imaging   MRI brain  IMPRESSION: 1. Postoperative changes from interval right frontal craniotomy for resection of previously identified right frontal lobe mass. Somewhat ill-defined parenchymal enhancement about the resection cavity favored to reflect postoperative changes. No definite residual tumor seen on this immediate postoperative scan. 2. Small 1.2 cm infarct at the deep margin of the resection cavity. 3. Interval increase in T2/FLAIR signal intensity involving the adjacent right frontal region with increased regional mass effect and up to 6 mm of right-to-left shift.   09/19/2019 Surgery   Right CRANIOTOMY TUMOR EXCISION, FRONTAL and APPLICATION OF CRANIAL NAVIGATION by Dr Kathyrn Sheriff   09/19/2019 Pathology Results   FINAL MICROSCOPIC DIAGNOSIS:   A. BRAIN TUMOR, RIGHT FRONTAL, EXCISION:  - Small cell carcinoma.   B. BRAIN TUMOR, RIGHT FRONTAL DURA, EXCISION:  - Small cell carcinoma.    COMMENT:   CK8/18 (dot-like), Synaptophysin and Chromogranin are positive.  CKAE1AE3, CK7 and Chromogranin are noncontributory.  Ki-67 proliferation  index reaches 70-80%.  The morphology and immunophenotype are compatible  with the provided clinical history of small cell carcinoma.  Dr. Melina Copa  reviewed.    10/27/2019 Imaging   IMPRESSION: 1. Significant interval enlargement of bulky mediastinal and left hilar lymphadenopathy. There are new pleural nodules or prevascular lymph nodes. 2. Significant interval increase in masslike consolidation about the left hilum.  There is interlobular septal thickening and scattered ground-glass and heterogeneous airspace opacity throughout the left lung, predominantly involving the left upper lobe and concerning for lymphangitic spread and postobstructive airspace disease. 3. There is somewhat nodular pleural thickening about the medial left upper lobe and a new small left pleural effusion. 4. Findings are consistent with worsened lung malignancy with pleural and nodal metastatic disease. 5. No significant interval change in a left adrenal mass, likely an incidental adenoma. Attention on follow-up. 6. No definite evidence of distant metastatic disease within the abdomen or pelvis. 7. Emphysema (ICD10-J43.9). 8. Aortic Atherosclerosis (ICD10-I70.0)   11/07/2019 -  Chemotherapy   First-line chemo Carboplatin day 1, Etopside day 1-3 with immunotherapy Tecentriq day 1 q3weeks starting 11/07/19   11/18/2019 Imaging   MRI Brain  IMPRESSION: 1. New heterogenously enhancing right anterior and middle cranial fossa tumor measuring 6.7 x 5.2 x 6.2 cm. 2. Increased leftward midline shift of 0.9 cm with leftward displacement of the ACAs. The ACAs remain patent. 3. New mild mass effect upon the right midbrain. 4. Confluent T2/FLAIR hyperintense right cerebrum signal is increased since prior exam. 5. Multifocal dehiscence involving the right anterior cranial fossa floor with extension of tumor into the superior ethmoid and superior right orbit.     ALLERGIES:  is allergic to penicillins.  MEDICATIONS:  Current Facility-Administered Medications  Medication Dose Route Frequency Provider Last Rate Last Admin  . acetaminophen (TYLENOL) tablet 650 mg  650 mg Oral Q6H PRN Marylyn Ishihara, Tyrone A, DO   650 mg at 12/31/19 0220   Or  . acetaminophen (TYLENOL) suppository 650 mg  650 mg Rectal Q6H PRN Marylyn Ishihara, Tyrone A, DO      . Chlorhexidine Gluconate Cloth 2 % PADS 6 each  6 each Topical Daily Aline August, MD   6 each at 12/30/19  1032  . dexamethasone (DECADRON) injection 4 mg  4 mg Intravenous Q12H Kyle, Tyrone A, DO   4 mg at 12/31/19 1027  . diazepam (VALIUM) tablet 10 mg  10 mg Oral TID Lane Hacker L, DO   10 mg at 12/31/19 1026  . haloperidol lactate (HALDOL) injection 2-5 mg  2-5 mg Intravenous Q6H PRN Aline August, MD   5 mg at 12/30/19 1727  . HYDROmorphone (DILAUDID) injection 1-2 mg  1-2 mg Intravenous Q2H PRN Lane Hacker L, DO   2 mg at 12/31/19 9024  . LORazepam (ATIVAN) injection 1-2 mg  1-2 mg Intravenous Q4H PRN Lane Hacker L, DO      . oxyCODONE (Oxy IR/ROXICODONE) immediate release tablet 10 mg  10 mg Oral TID Lane Hacker L, DO   10 mg at 12/31/19 1026  . oxyCODONE (Oxy IR/ROXICODONE) immediate release tablet 10 mg  10 mg Oral Q4H PRN Lane Hacker L, DO   10 mg at 12/31/19 0225  . potassium chloride SA (KLOR-CON) CR tablet 40 mEq  40 mEq Oral Once Marylyn Ishihara, Tyrone A, DO      . potassium chloride SA (KLOR-CON) CR tablet 40 mEq  40 mEq Oral Daily Kyle, Tyrone A, DO   40 mEq at 12/31/19 1025  . prochlorperazine (COMPAZINE) injection 10 mg  10 mg Intravenous Q6H PRN Kyle, Tyrone A, DO      . sodium chloride flush (NS)  0.9 % injection 10-40 mL  10-40 mL Intracatheter Q12H Starla Link, Kshitiz, MD   10 mL at 12/30/19 1032  . sodium chloride flush (NS) 0.9 % injection 10-40 mL  10-40 mL Intracatheter PRN Aline August, MD        VITAL SIGNS: BP 119/65 (BP Location: Right Arm)   Pulse 69   Temp 97.7 F (36.5 C) (Oral)   Resp 17   Ht '5\' 2"'  (1.575 m)   Wt 147 lb 14.9 oz (67.1 kg)   LMP 04/24/2012   SpO2 97%   BMI 27.06 kg/m  Filed Weights   12/29/19 1827  Weight: 147 lb 14.9 oz (67.1 kg)    Estimated body mass index is 27.06 kg/m as calculated from the following:   Height as of this encounter: '5\' 2"'  (1.575 m).   Weight as of this encounter: 147 lb 14.9 oz (67.1 kg).  LABS: CBC:    Component Value Date/Time   WBC 10.2 12/30/2019 0514   HGB 9.4 (L) 12/30/2019 0514    HGB 10.9 (L) 11/16/2019 1101   HCT 31.8 (L) 12/30/2019 0514   PLT 519 (H) 12/30/2019 0514   PLT 240 11/16/2019 1101   MCV 90.9 12/30/2019 0514   NEUTROABS 9.3 (H) 12/29/2019 1104   LYMPHSABS 0.4 (L) 12/29/2019 1104   MONOABS 0.5 12/29/2019 1104   EOSABS 0.0 12/29/2019 1104   BASOSABS 0.0 12/29/2019 1104   Comprehensive Metabolic Panel:    Component Value Date/Time   NA 141 12/30/2019 0514   K 3.2 (L) 12/30/2019 0514   CL 105 12/30/2019 0514   CO2 23 12/30/2019 0514   BUN 22 (H) 12/30/2019 0514   CREATININE 0.54 12/30/2019 0514   CREATININE 0.91 11/16/2019 1101   GLUCOSE 113 (H) 12/30/2019 0514   CALCIUM 9.6 12/30/2019 0514   AST 22 12/30/2019 0514   AST 23 11/16/2019 1101   ALT 29 12/30/2019 0514   ALT 40 11/16/2019 1101   ALKPHOS 70 12/30/2019 0514   BILITOT 0.3 12/30/2019 0514   BILITOT 0.3 11/16/2019 1101   PROT 7.0 12/30/2019 0514   ALBUMIN 3.8 12/30/2019 0514    RADIOGRAPHIC STUDIES: CT Head Wo Contrast  Result Date: 12/29/2019 CLINICAL DATA:  Known brain metastases. Worsening headache, nausea vomiting. Failure to thrive. History of lung carcinoma. EXAM: CT HEAD WITHOUT CONTRAST TECHNIQUE: Contiguous axial images were obtained from the base of the skull through the vertex without intravenous contrast. COMPARISON:  Brain MRI, 12/09/2019. FINDINGS: Brain: There is heterogeneous attenuation along the anterior inferior right frontal lobe deep to a craniotomy flap. There is significant vasogenic edema extending throughout the right frontal lobe into the adjacent right parietal and temporal lobes, with significant mass effect. There is near complete effacement of the right lateral ventricle. Midline shift to the left measures 1.4 cm. There is partial effacement of the right basilar cisterns without herniation. No evidence of acute infarction. No intracranial hemorrhage. No extra-axial masses or fluid collections. Vascular: No hyperdense vessel or unexpected calcification. Skull:  No fracture. No bone lesion. Right frontal craniotomy flap is well aligned. Sinuses/Orbits: Soft tissue mass in the superior extraconal right orbit broadly abutting the superior orbital wall, measuring 1.8 x 1.2 x 1.9 cm. This depresses the underlying superior rectus muscle. No right intraconal orbital abnormality. No abnormality of the right globe. Normal left globe and orbit. Visualized sinuses are clear. Other: None. IMPRESSION: 1. Vasogenic edema with significant right-sided mass effect from the known right frontotemporal brain masses. The masses are not well-defined on  CT. The degree of mass effect has increased, with the right lateral ventricle now mostly effaced and midline shift to the left increased to 1.4 cm. 2. There is a right superior orbital, extraconal mass consistent with metastatic disease versus contiguous spread. No skull defect is evident. This mass has increased in size from the prior brain MRI. 3. No evidence of acute infarction and no intracranial hemorrhage. Electronically Signed   By: Lajean Manes M.D.   On: 12/29/2019 12:45   MR Brain W Wo Contrast  Result Date: 12/09/2019 CLINICAL DATA:  54 year old female with metastatic small cell lung cancer. Whole brain radiation completed in February. Systemic progression in September. Progressive right frontal lobe lesion with edema this summer, status post craniotomy and resection in August, pathology revealing small cell carcinoma. And then substantial progression of disease in the anterior right hemisphere last month. EXAM: MRI HEAD WITHOUT AND WITH CONTRAST TECHNIQUE: Multiplanar, multiecho pulse sequences of the brain and surrounding structures were obtained without and with intravenous contrast. CONTRAST:  37m MULTIHANCE GADOBENATE DIMEGLUMINE 529 MG/ML IV SOLN COMPARISON:  Brain MRI 11/18/2019 and earlier. FINDINGS: Brain: Bulky and lobulated, confluent enhancing tumor from the right anterior temporal tip through to the anterior  inferior right frontal gyrus encompassing an area of 71 x 49 x 51 mm (AP by transverse by CC). Compared to last month and measured in the same way tumor size and configuration has not significantly changed (71 x 45 x 53 mm). Enhancement pattern is stable allowing for differences in technique. Tumor extends to the surface of the brain as before, but without additional superimposed meningeal thickening or enhancement. Extensive T2 and FLAIR hyperintensity throughout the anterior right frontal lobe, tracking through the deep white matter capsules into the right temporal lobe most likely is confluent vasogenic edema and not significantly changed. Intracranial mass effect with leftward midline shift has mildly improved to 5 to 6 mm (previously up to 9 mm). Basilar cisterns are normal. No ventriculomegaly. No other abnormal intracranial enhancement identified. No superimposed restricted diffusion suggestive of acute infarction. Noextra-axial fluid collection or acute intracranial hemorrhage. Cervicomedullary junction and pituitary are within normal limits. Stable gray and white matter signal outside of the right hemisphere. Vascular: Major intracranial vascular flow voids are stable. Skull and upper cervical spine: Negative visible cervical spine. Sequelae of anterior right frontal craniotomy. No superimposed bony metastatic disease identified. Sinuses/Orbits: Optic chiasm and orbits soft tissues remain within normal limits. Paranasal Visualized paranasal sinuses and mastoids are stable and well pneumatized. Other: Visible internal auditory structures appear normal. Stable scalp. IMPRESSION: 1. Bulky, enhancing tumor in the anterior right hemisphere has not significantly changed since 11/18/2019. Stable extensive vasogenic edema. But intracranial mass effect and midline shift (now 5-6 mm) have improved. 2. No new metastatic disease or new intracranial abnormality identified. Electronically Signed   By: HGenevie AnnM.D.   On:  12/09/2019 23:46   DG Chest Portable 1 View  Result Date: 12/29/2019 CLINICAL DATA:  Headache nausea vomiting and fatigue. EXAM: PORTABLE CHEST 1 VIEW COMPARISON:  Chest radiograph August 19, 2019 and chest CT October 27, 2019 FINDINGS: Right chest wall porta catheter with tip overlying the superior cavoatrial junction. Unchanged heart size. Bulky mediastinal adenopathy similar to chest CT. Similar left upper lobe perihilar mass like consolidation with interlobular septal thickening. No new focal consolidation. No visible pneumothorax or pleural effusion. The visualized skeletal structures are unchanged. IMPRESSION: 1. Similar left upper lobe perihilar mass like consolidation with adjacent interlobular septal thickening. No new  focal consolidation. Electronically Signed   By: Dahlia Bailiff MD   On: 12/29/2019 11:15    PERFORMANCE STATUS (ECOG) : 4 - Bedbound  Review of Systems Unable to complete  Physical Exam General: Thin, ill-appearing Pulmonary: Unlabored Extremities: no edema, no joint deformities Skin: no rashes Neurological: Right-sided weakness, lethargic, responsive stimuli but falls asleep during conversation  IMPRESSION: Patient is noted to have had severe confusion overnight requiring restraints and chemical sedation. This morning, she is comfortable appearing and wakes with verbal stimulation. However, I had difficulty keeping her awake to engage meaningfully in a conversation regarding goals. Patient does report that her pain is improved.  After several attempts, I was able to reach her significant other - Aaron Edelman. Note that he does not currently have his phone and instead asked that he be reached by calling patient's friend - Earlie Lou [phone]).   Aaron Edelman says that he spoke with medical oncology yesterday and is aware that patient's prognosis is felt to be limited to days to weeks. He says that his goal is to keep patient as comfortable as possible. We discussed  end-of-life and hospice care either at home or at a hospice facility. Aaron Edelman states that he would prefer patient to transfer to a hospice facility for end-of-life care. He requested [facility].   I note the patient has a son. However, per Aaron Edelman, patient has limited communication with her son and has not seen him in many years. Aaron Edelman states that her son is aware of the severity of her cancer. Aaron Edelman tells me that he does not currently have a way to reach patient's son.  PLAN: -Recommend comfort care -Discussed medication management with RN -TOC consult to coordinate discharge to hospice [facility] -DNR/DNI  Case and plan discussed with Dr. Starla Link   Time Total: 60 minutes  Visit consisted of counseling and education dealing with the complex and emotionally intense issues of symptom management and palliative care in the setting of serious and potentially life-threatening illness.Greater than 50%  of this time was spent counseling and coordinating care related to the above assessment and plan.  Signed by: Altha Harm, PhD, NP-C

## 2019-12-31 NOTE — Discharge Summary (Addendum)
Physician Discharge Summary  Mazy Culton EQA:[id number] DOB: [birth year] DOA: 12/29/2019  PCP: [name], MD  Admit date: 12/29/2019 Discharge date: 01/01/2020  Admitted From: Home Disposition: Residential hospice  Recommendations for Outpatient Follow-up:  1. 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  On presentation, CT of the head showed brain mets with worsening vasogenic edema and mass-effect.  ED provider spoke with neurosurgery who recommended no surgical intervention and to continue steroids and recommended palliative care consult. Oncology also recommended hospice.  Family is agreeable for residential hospice.  She will be discharged to residential hospice once arrangements have been made.  Addendum on 01/01/2020: Patient is waiting to be discharged to residential hospice.  She is currently comfortable in no distress.  She will be discharged to his nasal hospital once bed is available.  Discharge Diagnoses:  Headache with nausea and vomiting due to worsening vasogenic brain edema and mass-effect History of small cell lung cancer with brain metastases Anemia of chronic disease Thrombocytosis Hypokalemia  Plan -As per discussions above. She will be discharged to residential hospice once arrangements have been made.  Discharge Instructions  Discharge Instructions    Diet general   Complete by: As directed    Per comfort measures   Increase activity slowly   Complete by: As directed      Allergies as of 12/31/2019      Reactions   Penicillins Nausea And Vomiting   Did it involve swelling of the face/tongue/throat, SOB, or low BP? No Did it involve sudden or severe  rash/hives, skin peeling, or any reaction on the inside of your mouth or nose? No Did you need to seek medical attention at a hospital or doctor's office? No When did it last happen?20 years If all above answers are "NO", may proceed with cephalosporin use.      Medication List    STOP taking these medications   dronabinol 5 MG capsule Commonly known as: MARINOL   gabapentin 300 MG capsule Commonly known as: NEURONTIN   lidocaine-prilocaine cream Commonly known as: EMLA   ondansetron 8 MG tablet Commonly known as: Zofran   PRESCRIPTION MEDICATION     TAKE these medications   acetaminophen 325 MG tablet Commonly known as: TYLENOL Take 1,300 mg by mouth every 6 (six) hours as needed for mild pain or moderate pain.   dexamethasone 4 MG tablet Commonly known as: DECADRON Take 1 tablet (4 mg total) by mouth 2 (two) times daily.   diazepam 10 MG tablet Commonly known as: VALIUM Take 1 tablet (10 mg total) by mouth 3 (three) times daily. What changed:   how much to take  when to take this  additional instructions   lidocaine 5 % Commonly known as: Lidoderm Place 1 patch onto the skin daily.   LORazepam 1 MG tablet Commonly known as: ATIVAN Take 1 tablet (1 mg total) by mouth every 12 (twelve) hours as needed for anxiety.   Oxycodone HCl 10 MG Tabs Take 1 tablet (10 mg total) by mouth 3 (three) times daily. What changed:   medication strength  how much to take  when to take this  reasons to take this   Oxycodone HCl 10 MG Tabs Take 1 tablet (10 mg total) by  mouth every 4 (four) hours as needed for severe pain. What changed: You were already taking a medication with the same name, and this prescription was added. Make sure you understand how and when to take each.   prochlorperazine 10 MG tablet Commonly known as: COMPAZINE Take 1 tablet (10 mg total) by mouth every 6 (six) hours as needed (Nausea or vomiting).        Follow-up Information     Residential hospice Follow up.   Why: At earliest convenience             Allergies  Allergen Reactions  . Penicillins Nausea And Vomiting    Did it involve swelling of the face/tongue/throat, SOB, or low BP? No Did it involve sudden or severe rash/hives, skin peeling, or any reaction on the inside of your mouth or nose? No Did you need to seek medical attention at a hospital or doctor's office? No When did it last happen?20 years If all above answers are "NO", may proceed with cephalosporin use.    Consultations: Oncology/palliative care.  ED provider curb sided on-call neurosurgery   Procedures/Studies: CT Head Wo Contrast  Result Date: 12/29/2019 CLINICAL DATA:  Known brain metastases. Worsening headache, nausea vomiting. Failure to thrive. History of lung carcinoma. EXAM: CT HEAD WITHOUT CONTRAST TECHNIQUE: Contiguous axial images were obtained from the base of the skull through the vertex without intravenous contrast. COMPARISON:  Brain MRI, 12/09/2019. FINDINGS: Brain: There is heterogeneous attenuation along the anterior inferior right frontal lobe deep to a craniotomy flap. There is significant vasogenic edema extending throughout the right frontal lobe into the adjacent right parietal and temporal lobes, with significant mass effect. There is near complete effacement of the right lateral ventricle. Midline shift to the left measures 1.4 cm. There is partial effacement of the right basilar cisterns without herniation. No evidence of acute infarction. No intracranial hemorrhage. No extra-axial masses or fluid collections. Vascular: No hyperdense vessel or unexpected calcification. Skull: No fracture. No bone lesion. Right frontal craniotomy flap is well aligned. Sinuses/Orbits: Soft tissue mass in the superior extraconal right orbit broadly abutting the superior orbital wall, measuring 1.8 x 1.2 x 1.9 cm. This depresses the underlying superior rectus muscle. No right intraconal  orbital abnormality. No abnormality of the right globe. Normal left globe and orbit. Visualized sinuses are clear. Other: None. IMPRESSION: 1. Vasogenic edema with significant right-sided mass effect from the known right frontotemporal brain masses. The masses are not well-defined on CT. The degree of mass effect has increased, with the right lateral ventricle now mostly effaced and midline shift to the left increased to 1.4 cm. 2. There is a right superior orbital, extraconal mass consistent with metastatic disease versus contiguous spread. No skull defect is evident. This mass has increased in size from the prior brain MRI. 3. No evidence of acute infarction and no intracranial hemorrhage. Electronically Signed   By: Lajean Manes M.D.   On: 12/29/2019 12:45   MR Brain W Wo Contrast  Result Date: 12/09/2019 CLINICAL DATA:  54 year old female with metastatic small cell lung cancer. Whole brain radiation completed in February. Systemic progression in September. Progressive right frontal lobe lesion with edema this summer, status post craniotomy and resection in August, pathology revealing small cell carcinoma. And then substantial progression of disease in the anterior right hemisphere last month. EXAM: MRI HEAD WITHOUT AND WITH CONTRAST TECHNIQUE: Multiplanar, multiecho pulse sequences of the brain and surrounding structures were obtained without and with intravenous contrast. CONTRAST:  81mL  MULTIHANCE GADOBENATE DIMEGLUMINE 529 MG/ML IV SOLN COMPARISON:  Brain MRI 11/18/2019 and earlier. FINDINGS: Brain: Bulky and lobulated, confluent enhancing tumor from the right anterior temporal tip through to the anterior inferior right frontal gyrus encompassing an area of 71 x 49 x 51 mm (AP by transverse by CC). Compared to last month and measured in the same way tumor size and configuration has not significantly changed (71 x 45 x 53 mm). Enhancement pattern is stable allowing for differences in technique. Tumor  extends to the surface of the brain as before, but without additional superimposed meningeal thickening or enhancement. Extensive T2 and FLAIR hyperintensity throughout the anterior right frontal lobe, tracking through the deep white matter capsules into the right temporal lobe most likely is confluent vasogenic edema and not significantly changed. Intracranial mass effect with leftward midline shift has mildly improved to 5 to 6 mm (previously up to 9 mm). Basilar cisterns are normal. No ventriculomegaly. No other abnormal intracranial enhancement identified. No superimposed restricted diffusion suggestive of acute infarction. Noextra-axial fluid collection or acute intracranial hemorrhage. Cervicomedullary junction and pituitary are within normal limits. Stable gray and white matter signal outside of the right hemisphere. Vascular: Major intracranial vascular flow voids are stable. Skull and upper cervical spine: Negative visible cervical spine. Sequelae of anterior right frontal craniotomy. No superimposed bony metastatic disease identified. Sinuses/Orbits: Optic chiasm and orbits soft tissues remain within normal limits. Paranasal Visualized paranasal sinuses and mastoids are stable and well pneumatized. Other: Visible internal auditory structures appear normal. Stable scalp. IMPRESSION: 1. Bulky, enhancing tumor in the anterior right hemisphere has not significantly changed since 11/18/2019. Stable extensive vasogenic edema. But intracranial mass effect and midline shift (now 5-6 mm) have improved. 2. No new metastatic disease or new intracranial abnormality identified. Electronically Signed   By: Genevie Ann M.D.   On: 12/09/2019 23:46   DG Chest Portable 1 View  Result Date: 12/29/2019 CLINICAL DATA:  Headache nausea vomiting and fatigue. EXAM: PORTABLE CHEST 1 VIEW COMPARISON:  Chest radiograph August 19, 2019 and chest CT October 27, 2019 FINDINGS: Right chest wall porta catheter with tip overlying the  superior cavoatrial junction. Unchanged heart size. Bulky mediastinal adenopathy similar to chest CT. Similar left upper lobe perihilar mass like consolidation with interlobular septal thickening. No new focal consolidation. No visible pneumothorax or pleural effusion. The visualized skeletal structures are unchanged. IMPRESSION: 1. Similar left upper lobe perihilar mass like consolidation with adjacent interlobular septal thickening. No new focal consolidation. Electronically Signed   By: Dahlia Bailiff MD   On: 12/29/2019 11:15       Subjective: Patient seen and examined at bedside.  Sleepy, wakes up slightly, hardly answers any questions.  Looks comfortable.  Nursing staff reports that patient was calm overnight.  Discharge Exam: Vitals:   12/31/19 0043 12/31/19 0549  BP: (!) 143/73 (!) 143/68  Pulse: 78 68  Resp: 15 18  Temp: 98 F (36.7 C) 97.8 F (36.6 C)  SpO2: 98% 99%    General: Chronically ill looking.  Sleepy, wakes up only very slightly.  Hardly answers any questions.  Slightly confused.  Poor historian. Cardiovascular: rate controlled, S1/S2 + Respiratory: bilateral decreased breath sounds at bases with some scattered crackles Abdominal: Soft, NT, ND, bowel sounds + Extremities: Trace lower extremity edema, no cyanosis    The results of significant diagnostics from this hospitalization (including imaging, microbiology, ancillary and laboratory) are listed below for reference.     Microbiology: Recent Results (from the past 240  hour(s))  Resp Panel by RT-PCR (Flu A&B, Covid) Nasopharyngeal Swab     Status: None   Collection Time: 12/29/19 11:04 AM   Specimen: Nasopharyngeal Swab; Nasopharyngeal(NP) swabs in vial transport medium  Result Value Ref Range Status   SARS Coronavirus 2 by RT PCR NEGATIVE NEGATIVE Final    Comment: (NOTE) SARS-CoV-2 target nucleic acids are NOT DETECTED.  The SARS-CoV-2 RNA is generally detectable in upper respiratory specimens during  the acute phase of infection. The lowest concentration of SARS-CoV-2 viral copies this assay can detect is 138 copies/mL. A negative result does not preclude SARS-Cov-2 infection and should not be used as the sole basis for treatment or other patient management decisions. A negative result may occur with  improper specimen collection/handling, submission of specimen other than nasopharyngeal swab, presence of viral mutation(s) within the areas targeted by this assay, and inadequate number of viral copies(<138 copies/mL). A negative result must be combined with clinical observations, patient history, and epidemiological information. The expected result is Negative.  Fact Sheet for Patients:  EntrepreneurPulse.com.au  Fact Sheet for Healthcare Providers:  IncredibleEmployment.be  This test is no t yet approved or cleared by the [place] FDA and  has been authorized for detection and/or diagnosis of SARS-CoV-2 by FDA under an Emergency Use Authorization (EUA). This EUA will remain  in effect (meaning this test can be used) for the duration of the COVID-19 declaration under Section 564(b)(1) of the Act, 21 U.S.C.section 360bbb-3(b)(1), unless the authorization is terminated  or revoked sooner.       Influenza A by PCR NEGATIVE NEGATIVE Final   Influenza B by PCR NEGATIVE NEGATIVE Final    Comment: (NOTE) The Xpert Xpress SARS-CoV-2/FLU/RSV plus assay is intended as an aid in the diagnosis of influenza from Nasopharyngeal swab specimens and should not be used as a sole basis for treatment. Nasal washings and aspirates are unacceptable for Xpert Xpress SARS-CoV-2/FLU/RSV testing.  Fact Sheet for Patients: EntrepreneurPulse.com.au  Fact Sheet for Healthcare Providers: IncredibleEmployment.be  This test is not yet approved or cleared by the [place] FDA and has been authorized for detection and/or  diagnosis of SARS-CoV-2 by FDA under an Emergency Use Authorization (EUA). This EUA will remain in effect (meaning this test can be used) for the duration of the COVID-19 declaration under Section 564(b)(1) of the Act, 21 U.S.C. section 360bbb-3(b)(1), unless the authorization is terminated or revoked.  Performed at [facility], [place] [address], [place], [place] [address]      Labs: BNP (last 3 results) No results for input(s): BNP in the last 8760 hours. Basic Metabolic Panel: Recent Labs  Lab 12/29/19 1104 12/30/19 0514  NA 143 141  K 3.1* 3.2*  CL 102 105  CO2 27 23  GLUCOSE 121* 113*  BUN 25* 22*  CREATININE 0.57 0.54  CALCIUM 9.9 9.6   Liver Function Tests: Recent Labs  Lab 12/29/19 1104 12/30/19 0514  AST 22 22  ALT 32 29  ALKPHOS 80 70  BILITOT 0.4 0.3  PROT 7.9 7.0  ALBUMIN 4.1 3.8   Recent Labs  Lab 12/29/19 1104  LIPASE 22   No results for input(s): AMMONIA in the last 168 hours. CBC: Recent Labs  Lab 12/29/19 1104 12/30/19 0514  WBC 10.2 10.2  NEUTROABS 9.3*  --   HGB 10.0* 9.4*  HCT 33.3* 31.8*  MCV 89.0 90.9  PLT 534* 519*   Cardiac Enzymes: No results for input(s): CKTOTAL, CKMB, CKMBINDEX, TROPONINI in the last 168  hours. BNP: Invalid input(s): POCBNP CBG: No results for input(s): GLUCAP in the last 168 hours. D-Dimer No results for input(s): DDIMER in the last 72 hours. Hgb A1c No results for input(s): HGBA1C in the last 72 hours. Lipid Profile No results for input(s): CHOL, HDL, LDLCALC, TRIG, CHOLHDL, LDLDIRECT in the last 72 hours. Thyroid function studies No results for input(s): TSH, T4TOTAL, T3FREE, THYROIDAB in the last 72 hours.  Invalid input(s): FREET3 Anemia work up No results for input(s): VITAMINB12, FOLATE, FERRITIN, TIBC, IRON, RETICCTPCT in the last 72 hours. Urinalysis    Component Value Date/Time   COLORURINE YELLOW 03/16/2009 1747   APPEARANCEUR CLOUDY (A) 03/16/2009 1747   LABSPEC  1.025 03/16/2009 1747   PHURINE 8.5 (H) 03/16/2009 1747   GLUCOSEU NEGATIVE 03/16/2009 1747   HGBUR NEGATIVE 03/16/2009 1747   BILIRUBINUR NEGATIVE 03/16/2009 1747   KETONESUR NEGATIVE 03/16/2009 1747   PROTEINUR 30 (A) 03/16/2009 1747   UROBILINOGEN 1.0 03/16/2009 1747   NITRITE NEGATIVE 03/16/2009 1747   LEUKOCYTESUR NEGATIVE 03/16/2009 1747   Sepsis Labs Invalid input(s): PROCALCITONIN,  WBC,  LACTICIDVEN Microbiology Recent Results (from the past 240 hour(s))  Resp Panel by RT-PCR (Flu A&B, Covid) Nasopharyngeal Swab     Status: None   Collection Time: 12/29/19 11:04 AM   Specimen: Nasopharyngeal Swab; Nasopharyngeal(NP) swabs in vial transport medium  Result Value Ref Range Status   SARS Coronavirus 2 by RT PCR NEGATIVE NEGATIVE Final    Comment: (NOTE) SARS-CoV-2 target nucleic acids are NOT DETECTED.  The SARS-CoV-2 RNA is generally detectable in upper respiratory specimens during the acute phase of infection. The lowest concentration of SARS-CoV-2 viral copies this assay can detect is 138 copies/mL. A negative result does not preclude SARS-Cov-2 infection and should not be used as the sole basis for treatment or other patient management decisions. A negative result may occur with  improper specimen collection/handling, submission of specimen other than nasopharyngeal swab, presence of viral mutation(s) within the areas targeted by this assay, and inadequate number of viral copies(<138 copies/mL). A negative result must be combined with clinical observations, patient history, and epidemiological information. The expected result is Negative.  Fact Sheet for Patients:  EntrepreneurPulse.com.au  Fact Sheet for Healthcare Providers:  IncredibleEmployment.be  This test is no t yet approved or cleared by the [place] FDA and  has been authorized for detection and/or diagnosis of SARS-CoV-2 by FDA under an Emergency Use Authorization  (EUA). This EUA will remain  in effect (meaning this test can be used) for the duration of the COVID-19 declaration under Section 564(b)(1) of the Act, 21 U.S.C.section 360bbb-3(b)(1), unless the authorization is terminated  or revoked sooner.       Influenza A by PCR NEGATIVE NEGATIVE Final   Influenza B by PCR NEGATIVE NEGATIVE Final    Comment: (NOTE) The Xpert Xpress SARS-CoV-2/FLU/RSV plus assay is intended as an aid in the diagnosis of influenza from Nasopharyngeal swab specimens and should not be used as a sole basis for treatment. Nasal washings and aspirates are unacceptable for Xpert Xpress SARS-CoV-2/FLU/RSV testing.  Fact Sheet for Patients: EntrepreneurPulse.com.au  Fact Sheet for Healthcare Providers: IncredibleEmployment.be  This test is not yet approved or cleared by the [place] FDA and has been authorized for detection and/or diagnosis of SARS-CoV-2 by FDA under an Emergency Use Authorization (EUA). This EUA will remain in effect (meaning this test can be used) for the duration of the COVID-19 declaration under Section 564(b)(1) of the Act, 21 U.S.C. section 360bbb-3(b)(1), unless  the authorization is terminated or revoked.  Performed at [facility], [place] [address] [place], [place] [address]      Time coordinating discharge: 35 minutes  SIGNED:   Aline August, MD  [facility] 12/31/2019, 9:43 AM

## 2019-12-31 NOTE — Progress Notes (Signed)
Palliative Care:  Follow up visit this afternoon for symptom check. No family present. Patient is more lucid and complains of head hurting. Still has some confusion. Will have RN give dose of hydromorphone.   Time Total: 10 minutes  Visit consisted of counseling and education dealing with the complex and emotionally intense issues of symptom management and palliative care in the setting of serious and potentially life-threatening illness.Greater than 50%  of this time was spent counseling and coordinating care related to the above assessment and plan.  Signed by: Altha Harm, PhD, NP-C

## 2019-12-31 NOTE — Progress Notes (Addendum)
Manufacturing engineer [facility])  Referral received for residential hospice at [facility].  There is not a bed available today. Attempted to reach [place] x 3. No option to leave message.   Will update hospital staff and TOC once bed is available.  Venia Carbon RN, BSN, [place] [facility] Liaison

## 2019-12-31 NOTE — Progress Notes (Signed)
Palliative Care  Marissa [name] is well known to me from the cancer center. She has struggled with symptoms related to her disease progression and required increasing pain and anxiety medications in the last couple of weeks due to worsening HA and neck pain. Notes reviewed-[name] is likely approaching EOL-I have had several conversations with her about her preferences for care if and when she had progression of her cancer that further reduced her QOL and ability to care for her self. She would not want he life prolonged in her current state. Agree with recommendations for hospice care.  Will see her on 12/4 and discuss transition with her significant other Aaron Edelman and try to reach her son. I have adjusted her orders to with both PO and IV medications to provide better symptom management. She was on high doses of opioids and scheduled benzodiazapines at home prior to admission. In addition to neurological decline she is at risk for seizure so I scheduled diazapam TID and her oxycodone TID. Will add a laxative to her regimen.  Lane Hacker, DO Palliative Medicine

## 2019-12-31 NOTE — Progress Notes (Signed)
The pt is reoriented and her restraints have been  d/c, she states that she will call the nursing staff when she needs to use the bath room or assist with adl. I will continue to monitor.

## 2019-12-31 NOTE — TOC Progression Note (Signed)
Transition of Care [facility]) - Progression Note    Patient Details  Name: Marissa [name] MRN: [id number] Date of Birth: 1966/01/23  [facility]) CM/SW Contact  Ross Ludwig, [place] Phone Number: 12/31/2019, 11:11 AM  Clinical Narrative:     CSW was informed that [facility] does not have a bed available today but she will be added to the wait list.     Expected Discharge Plan and Services           Expected Discharge Date: 12/31/19                                     Social Determinants of Health (SDOH) Interventions    Readmission Risk Interventions No flowsheet data found.

## 2020-01-01 NOTE — Progress Notes (Signed)
Manufacturing engineer [facility])  Consents completed, transport can be arranged.  Report can be called to [phone]  Please fax d/c summary to [phone]  Venia Carbon RN, BSN, [place] Liaison

## 2020-01-01 NOTE — Progress Notes (Signed)
Patient ID: Marissa [name], female   DOB: May 17, 1965, 54 y.o.   MRN: [id number] Patient is waiting to be discharged to residential hospice.  Patient seen at bedside.  She is currently comfortable in no distress.  Please refer to the discharge summary done by me on 12/31/2019 for full details.  She will be discharged to his nasal hospital once bed is available.

## 2020-01-01 NOTE — Progress Notes (Signed)
[facility] ([facility])  There is a bed for Ms. Swaminathan today at [facility].  [facility] must obtain necessary consents prior to her transferring.  Will reach out to [facility] for consents.  Once these are completed, [facility] will update TOC to arrange transport.  Venia Carbon RN, BSN, [facility] Liaison

## 2020-01-01 NOTE — Progress Notes (Signed)
Patient instructions were given to [facility] at [facility]. All questions were answered. Package was sent with [facility].

## 2020-01-01 NOTE — TOC Transition Note (Signed)
Transition of Care [facility]) - CM/SW Discharge Note   Patient Details  Name: Marissa [name] MRN: [id number] Date of Birth: 05-02-1965  [facility]) CM/SW Contact:  Greg Cutter, LCSW Phone Number: 01/01/2020, 11:33 AM  Clinical Narrative:   [name] bed available today for patient. Spouse will go to facility today to sign paperwork which is needed first before they can accept patient. [facility] RN will contact CSW once this has been completed. [facility] transportation will be used for discharge to [facility].   Readmission Risk Interventions No flowsheet data found.  Eula Fried, BSW, MSW, [email or web address] Phone: [phone]

## 2020-01-02 ENCOUNTER — Ambulatory Visit: Payer: Medicaid Other | Admitting: Radiation Oncology

## 2020-01-04 ENCOUNTER — Ambulatory Visit: Payer: Medicaid Other | Admitting: Radiation Oncology

## 2020-01-06 ENCOUNTER — Ambulatory Visit: Payer: Medicaid Other | Admitting: Radiation Oncology

## 2020-01-16 ENCOUNTER — Encounter: Payer: Self-pay | Admitting: Radiation Therapy

## 2020-01-18 ENCOUNTER — Inpatient Hospital Stay: Admission: RE | Admit: 2020-01-18 | Payer: Self-pay | Source: Ambulatory Visit | Admitting: Radiation Oncology

## 2020-01-24 NOTE — Progress Notes (Signed)
Phone check in with [place]. She complained of pain over scalp and is s/p brain mets resection. She continues to struggle with poor support at home and depression. I discussed safety relate to opioid pain medication and have referred her to support services. Refills sent to pharmacy. No changes to her regimen.

## 2020-01-28 DEATH — deceased

## 2020-03-05 ENCOUNTER — Ambulatory Visit: Payer: Medicaid Other | Admitting: Internal Medicine

## 2020-12-11 IMAGING — DX PORTABLE CHEST - 1 VIEW
1 series · 1 of 1 positions shown · non-contrast
Comparison: Portable exam 0270 hours compared to 8665 hours

CLINICAL DATA: Chest tube placement

EXAM:
PORTABLE CHEST 1 VIEW

[chest ap]
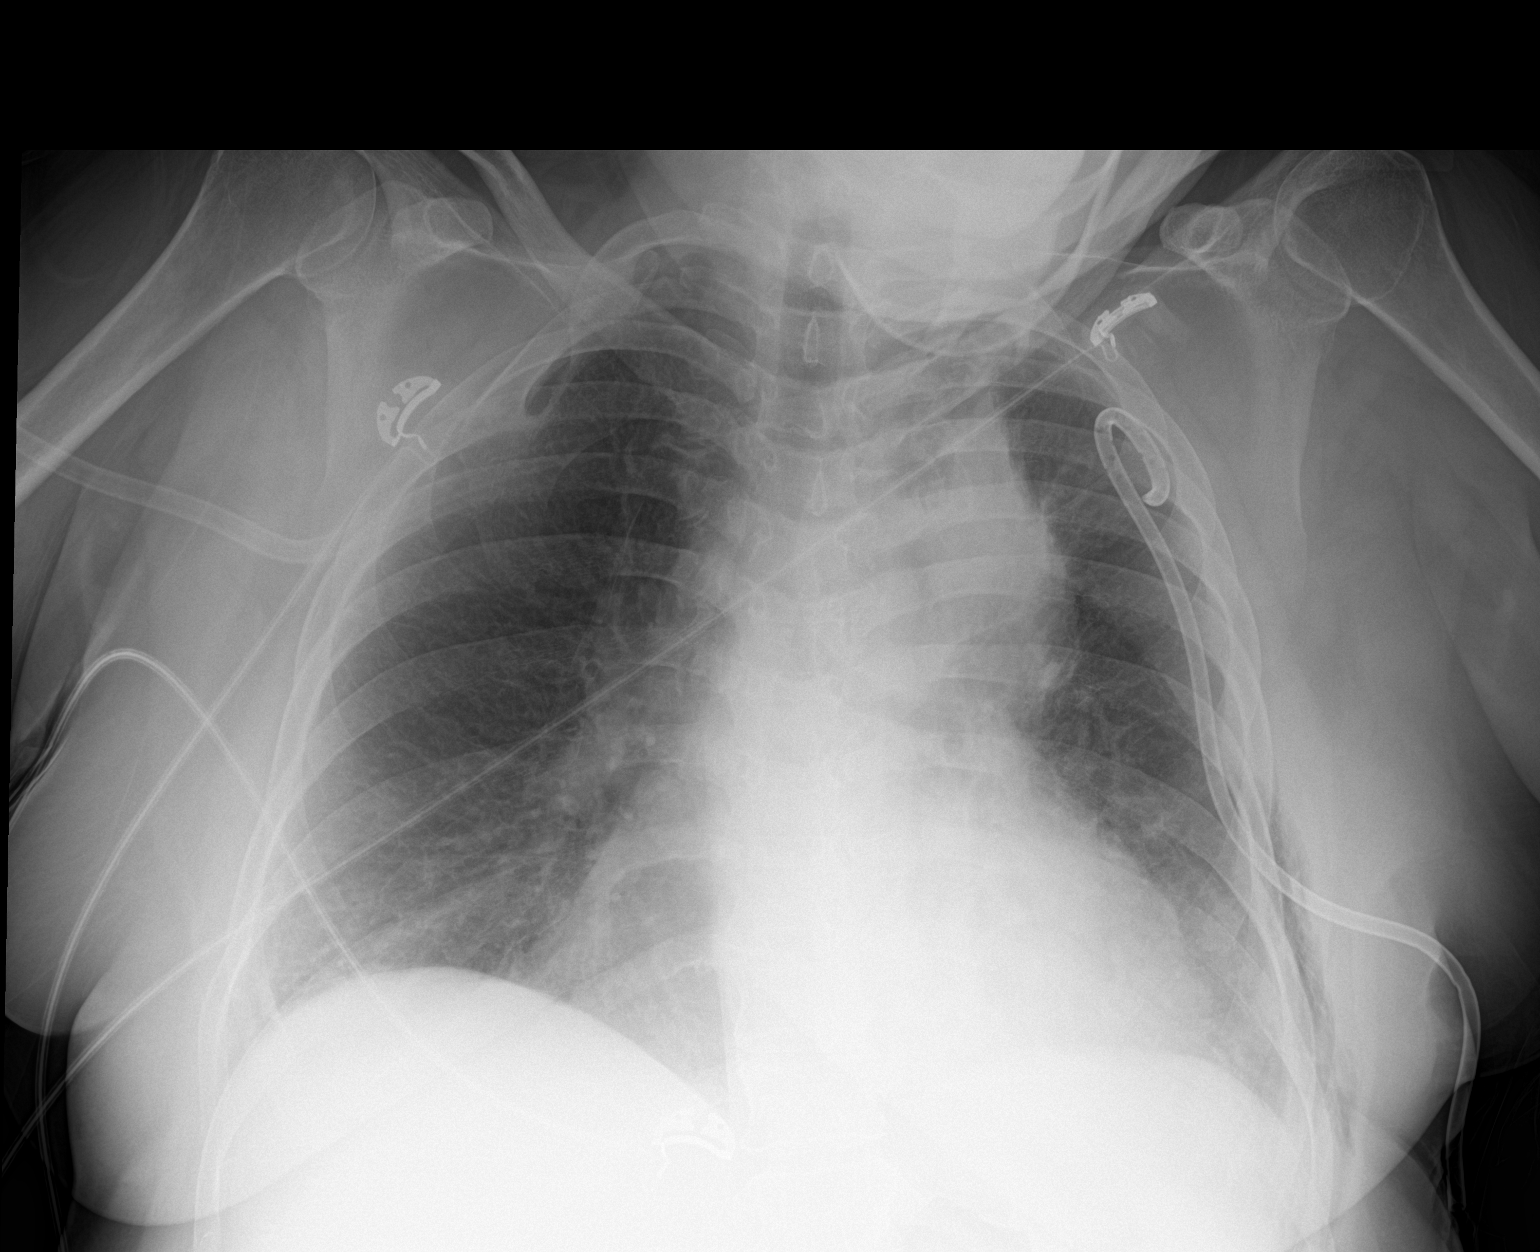

[1 of 1 positions shown; findings below may reference images not displayed]

FINDINGS: Interval placement of a pigtail LEFT thoracostomy tube.

Resolution of previously identified large LEFT tension pneumothorax.

Persistent atelectasis in LEFT upper lobe and portions of LEFT lower
lobe.

Resolution of mediastinal shift.

Small amount of LEFT lateral chest wall gas.

Heart size upper normal.

Subsegmental atelectasis RIGHT lung base.
IMPRESSION: Resolution of LEFT tension pneumothorax following thoracostomy tube
insertion.

Persistent significant atelectasis of LEFT lung and minimal
subsegmental atelectasis at RIGHT lung base.

## 2020-12-11 IMAGING — DX PORTABLE CHEST - 1 VIEW
1 series · 1 of 1 positions shown · non-contrast
Comparison: Chest x-ray dated 03/29/2018

CLINICAL DATA: Shortness of breath.  Status post bronchoscopy.  5

EXAM:
PORTABLE CHEST 1 VIEW

[chest ap]
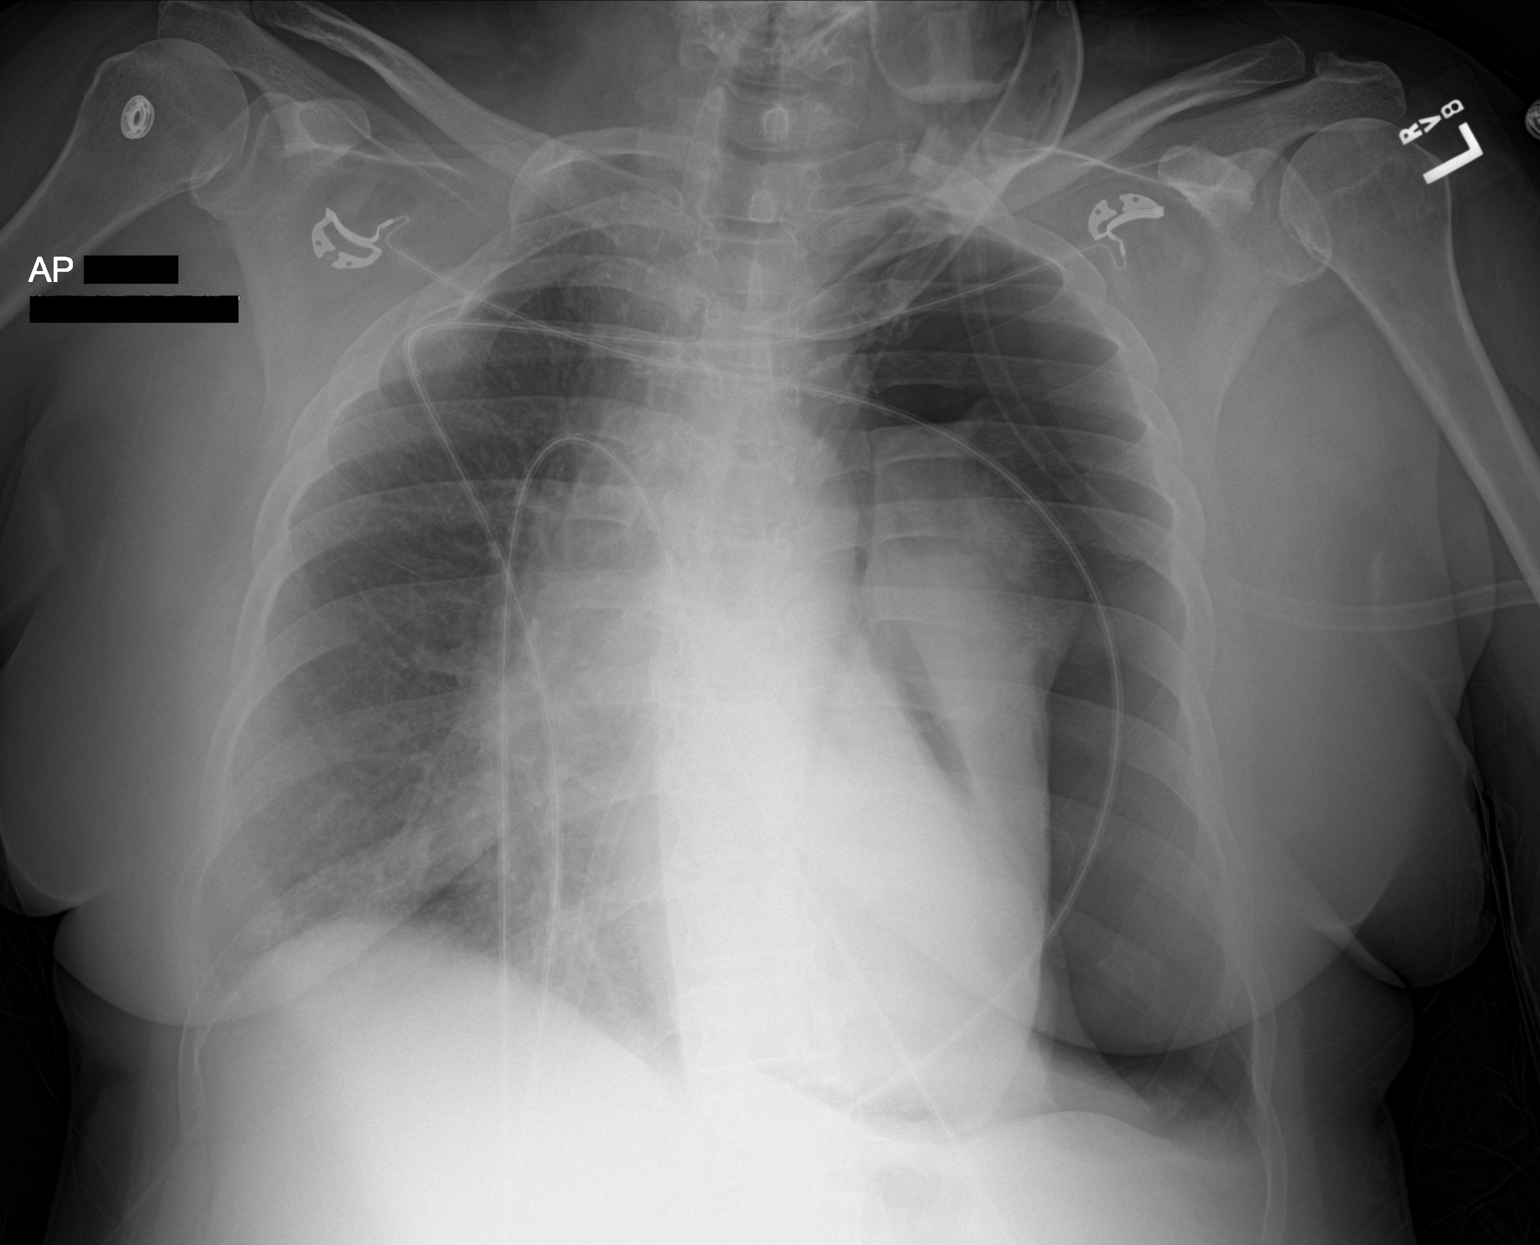

[1 of 1 positions shown; findings below may reference images not displayed]

FINDINGS: The patient has a large left tension pneumothorax with shift of the
heart mediastinal structures to the right. There is chronic
cardiomegaly. Pulmonary vascularity is normal. Right lung is clear.
The left lung has almost completely collapsed.

No acute bone abnormality.
IMPRESSION: Large left tension pneumothorax. Critical Value/emergent results
were called by telephone at the time of interpretation on 08/24/2018
at [DATE] to Don Lolito, RN , who verbally acknowledged these
results.

## 2020-12-12 IMAGING — DX PORTABLE CHEST - 1 VIEW
1 series · 1 of 1 positions shown · non-contrast
Comparison: Chest x-rays dated 08/24/2018

CLINICAL DATA: Pneumothorax.

EXAM:
PORTABLE CHEST 1 VIEW

[chest ap]
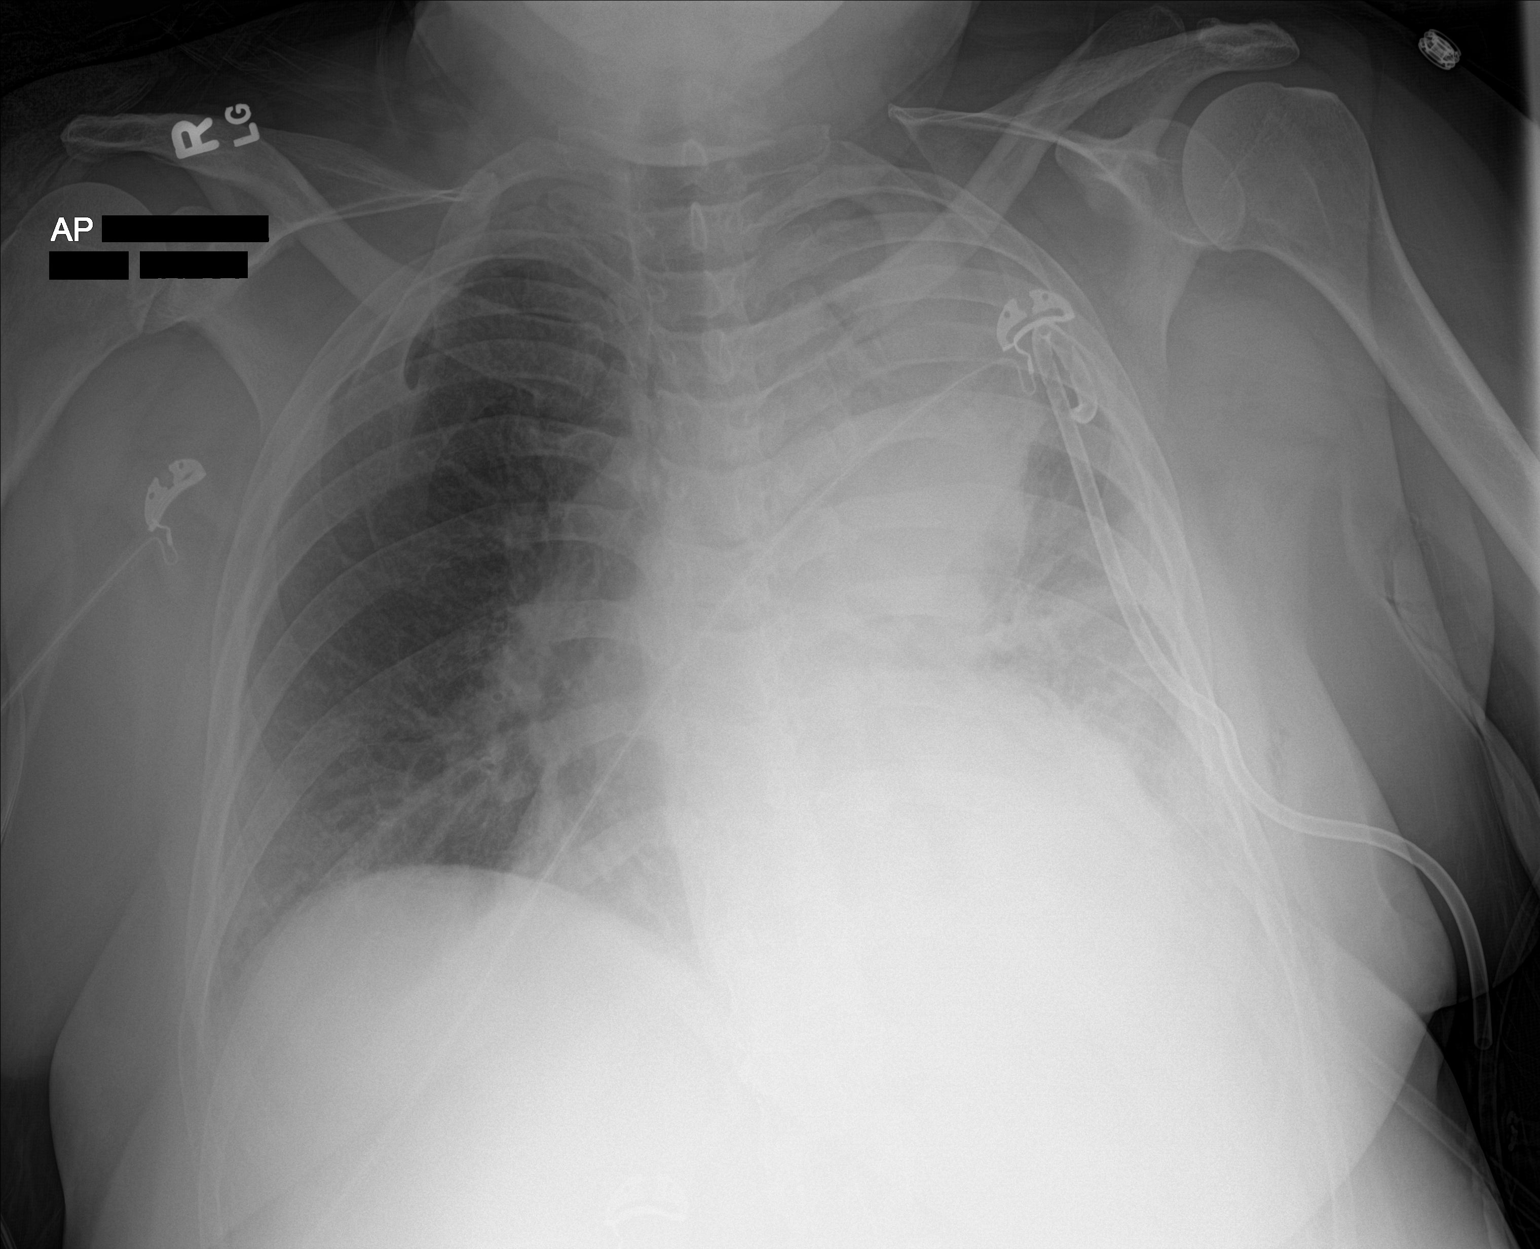

[1 of 1 positions shown; findings below may reference images not displayed]

FINDINGS: Left chest tube is unchanged in position. No visible residual
pneumothorax. However the, there is increased volume loss and
consolidation in the left upper and lower lung zones. Subcutaneous
emphysema on the left has almost completely resolved.

Chronic cardiomegaly.

Right lung is clear. Pulmonary vascularity on the right is normal.

No acute bone abnormality. Congenital anomaly of the right first and
second ribs.
IMPRESSION: 1. Increasing volume loss and consolidation in the left upper and
lower lung zones.
2. No residual pneumothorax.

## 2020-12-13 IMAGING — CT CT ABDOMEN AND PELVIS WITH CONTRAST
2 of 5 series · 16 of 46 positions shown, 18 images · IV contrast (APPLIED)
Comparison: 07/29/2018

CLINICAL DATA: Small-cell lung cancer in the lingula.  Staging.

EXAM:
CT ABDOMEN AND PELVIS WITH CONTRAST
TECHNIQUE: Multidetector CT imaging of the abdomen and pelvis was performed
using the standard protocol following bolus administration of
intravenous contrast.
CONTRAST:  100mL OMNIPAQUE IOHEXOL 300 MG/ML SOLN, 30mL OMNIPAQUE
IOHEXOL 300 MG/ML SOLN

[Series 2: axial st · axial · 0.82mm/px · z∈[-721,-336]mm · 13 of 89 slices shown, 15 images]
[im 6/89  soft-tissue]
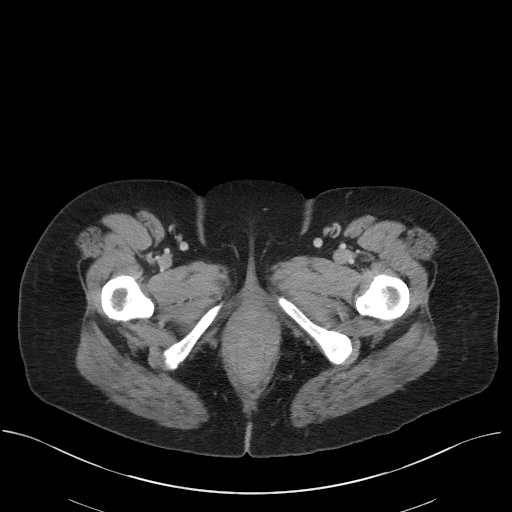
[im 6/89  bone]
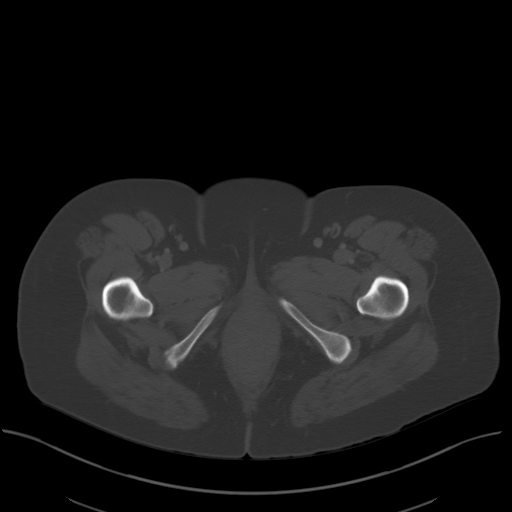
[im 12/89  soft-tissue]
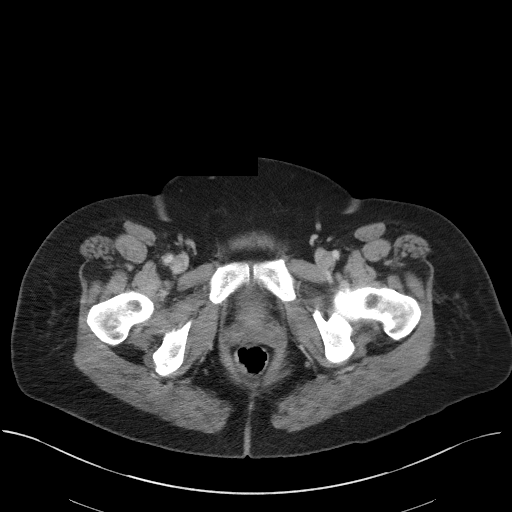
[im 18/89  soft-tissue]
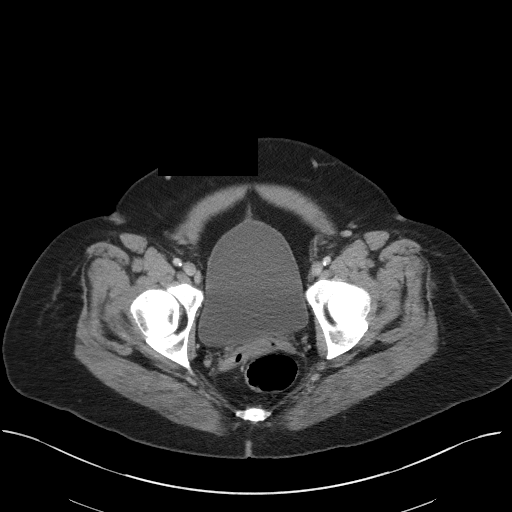
[im 24/89  soft-tissue]
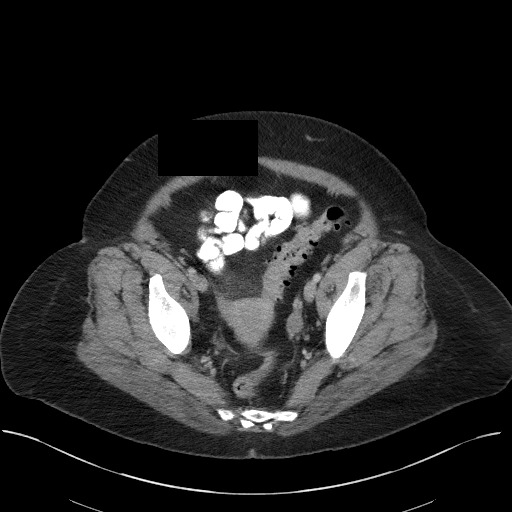
[im 30/89  soft-tissue]
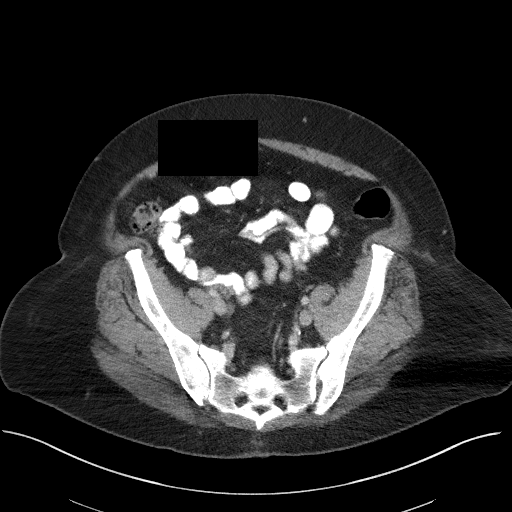
[im 36/89  soft-tissue]
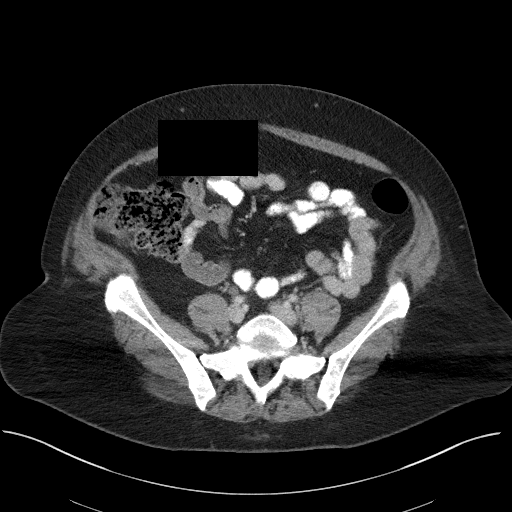
[im 47/89  soft-tissue]
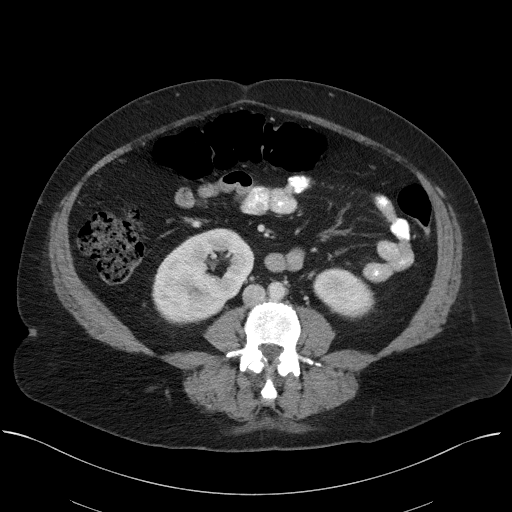
[im 53/89  soft-tissue]
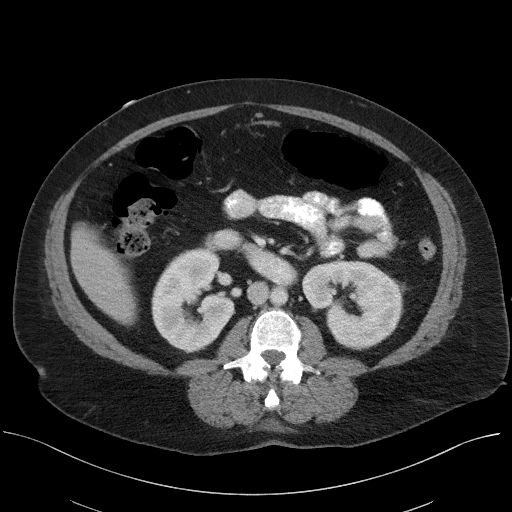
[im 59/89  soft-tissue]
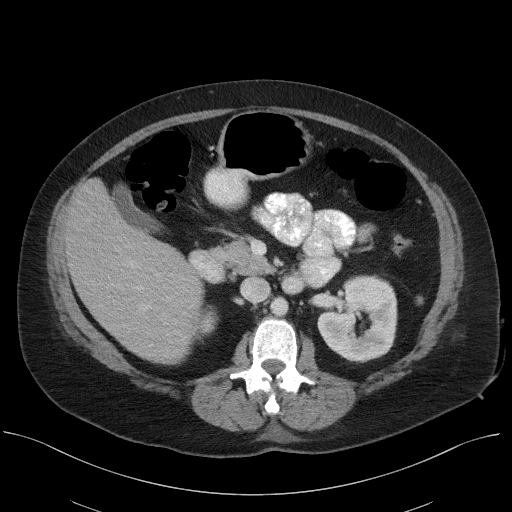
[im 59/89  bone]
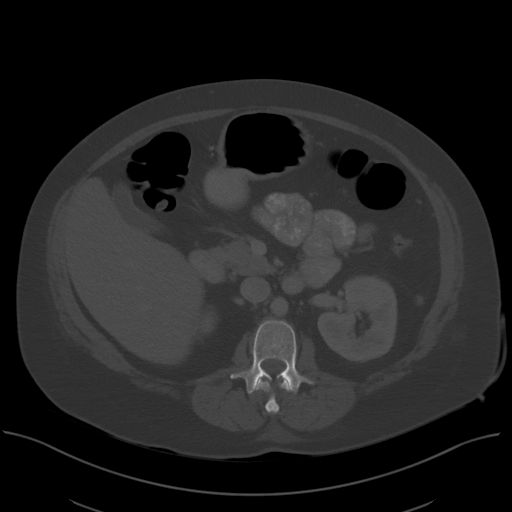
[im 65/89  soft-tissue]
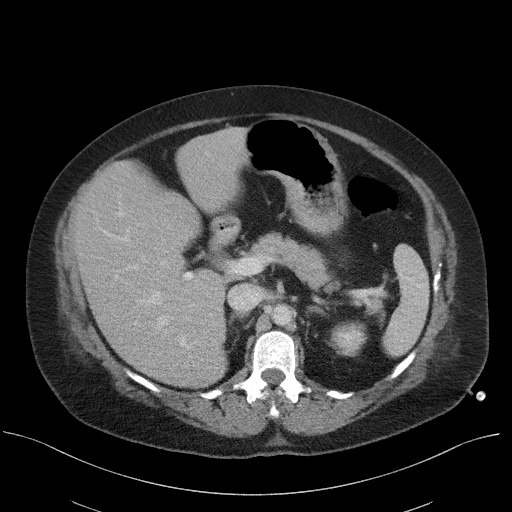
[im 71/89  soft-tissue]
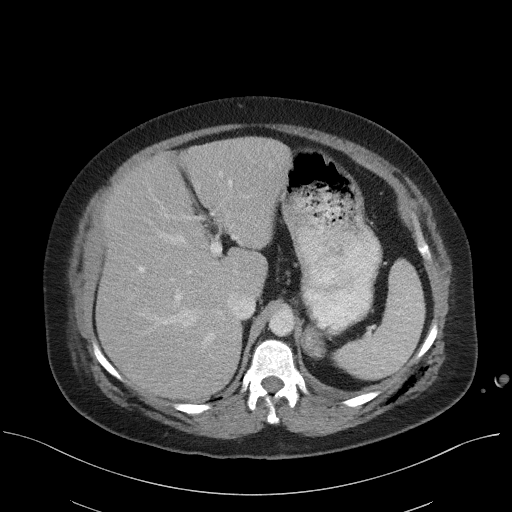
[im 77/89  soft-tissue]
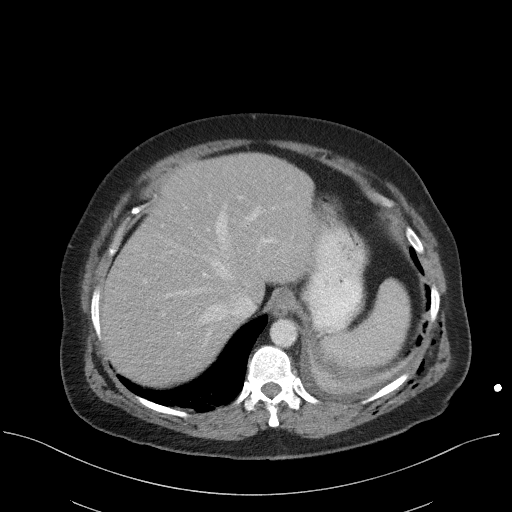
[im 83/89  soft-tissue]
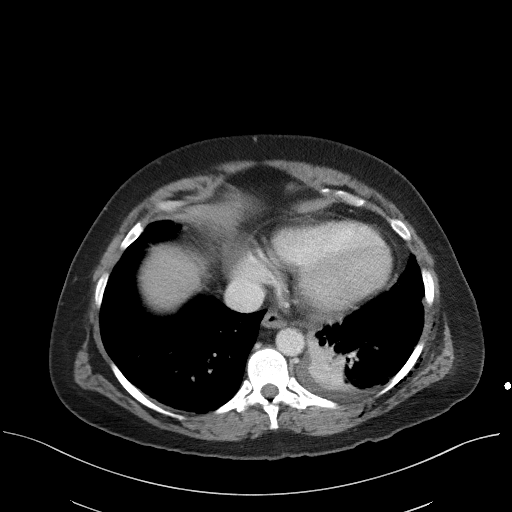

[Series 5: coronal st · coronal · 0.81mm/px · 3 of 103 slices shown]
[im 35/103  soft-tissue]
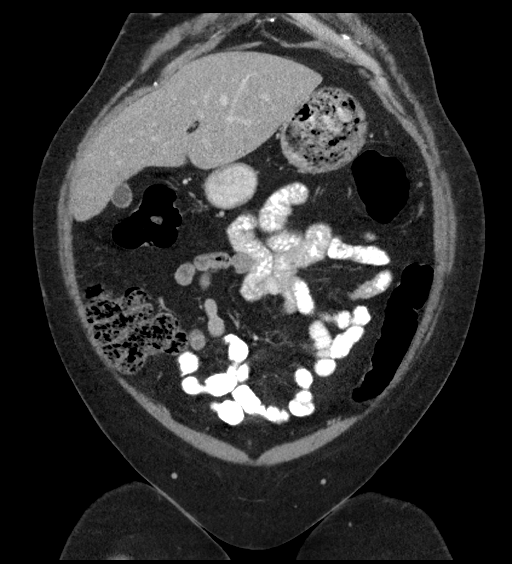
[im 46/103  soft-tissue]
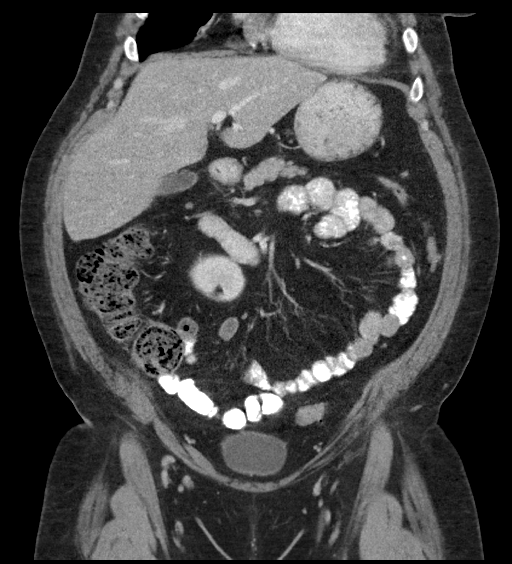
[im 57/103  soft-tissue]
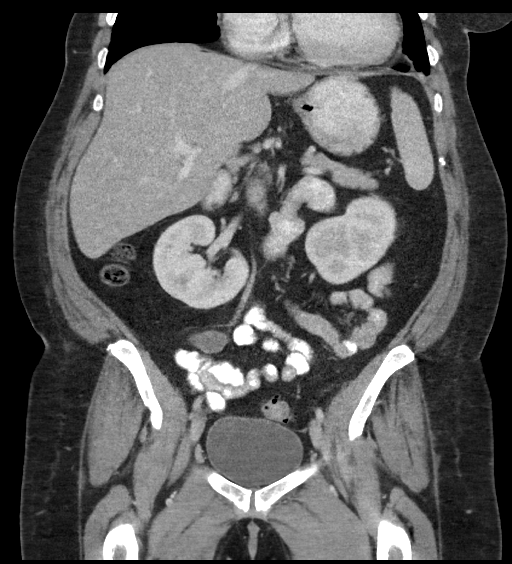

[16 of 46 positions shown; findings below may reference images not displayed]

FINDINGS: Lower chest: Heart appears enlarged. There is left base atelectasis
with small left effusion. Left pleural catheter identified but
incompletely visualized. There is some gas in the left chest wall
consistent with the presence of the pleural drain.

Hepatobiliary: No suspicious focal abnormality within the liver
parenchyma. There is no evidence for gallstones, gallbladder wall
thickening, or pericholecystic fluid. No intrahepatic or
extrahepatic biliary dilation.

Pancreas: No focal mass lesion. No dilatation of the main duct. No
intraparenchymal cyst. No peripancreatic edema.

Spleen: No splenomegaly. No focal mass lesion.

Adrenals/Urinary Tract: Right adrenal gland unremarkable. 2.9 cm
left adrenal nodule shows heterogeneous enhancement and a relative
washout value of 34%, indeterminate for adenoma. Metastatic lesion
not excluded.

Kidneys unremarkable. No evidence for hydroureter. The urinary
bladder appears normal for the degree of distention.

Stomach/Bowel: Stomach is unremarkable. No gastric wall thickening.
No evidence of outlet obstruction. Duodenum is normally positioned
as is the ligament of Treitz. No small bowel wall thickening. No
small bowel dilatation. The terminal ileum is normal. The appendix
is normal. No gross colonic mass. No colonic wall thickening.
Diverticular changes are noted in the left colon without evidence of
diverticulitis.

Vascular/Lymphatic: There is abdominal aortic atherosclerosis
without aneurysm. There is no gastrohepatic or hepatoduodenal
ligament lymphadenopathy. No intraperitoneal or retroperitoneal
lymphadenopathy. No pelvic sidewall lymphadenopathy.

Reproductive: The uterus is unremarkable.  There is no adnexal mass.

Other: No intraperitoneal free fluid.

Musculoskeletal: No worrisome lytic or sclerotic osseous
abnormality.
IMPRESSION: 1. 2.9 cm left adrenal nodule cannot be definitively characterized
on this study. Potentially a lipid poor adenoma, but metastatic
disease not excluded. MRI of the abdomen with and without contrast
may prove helpful to further evaluate.
2. No other findings to suggest metastatic disease in the
abdomen/pelvis.
3. Left base atelectasis with small left pleural effusion.

## 2020-12-13 IMAGING — DX PORTABLE CHEST - 1 VIEW
1 series · 1 of 1 positions shown · non-contrast
Comparison: Yesterday

CLINICAL DATA: Follow-up pneumothorax

EXAM:
PORTABLE CHEST 1 VIEW

[chest ap]
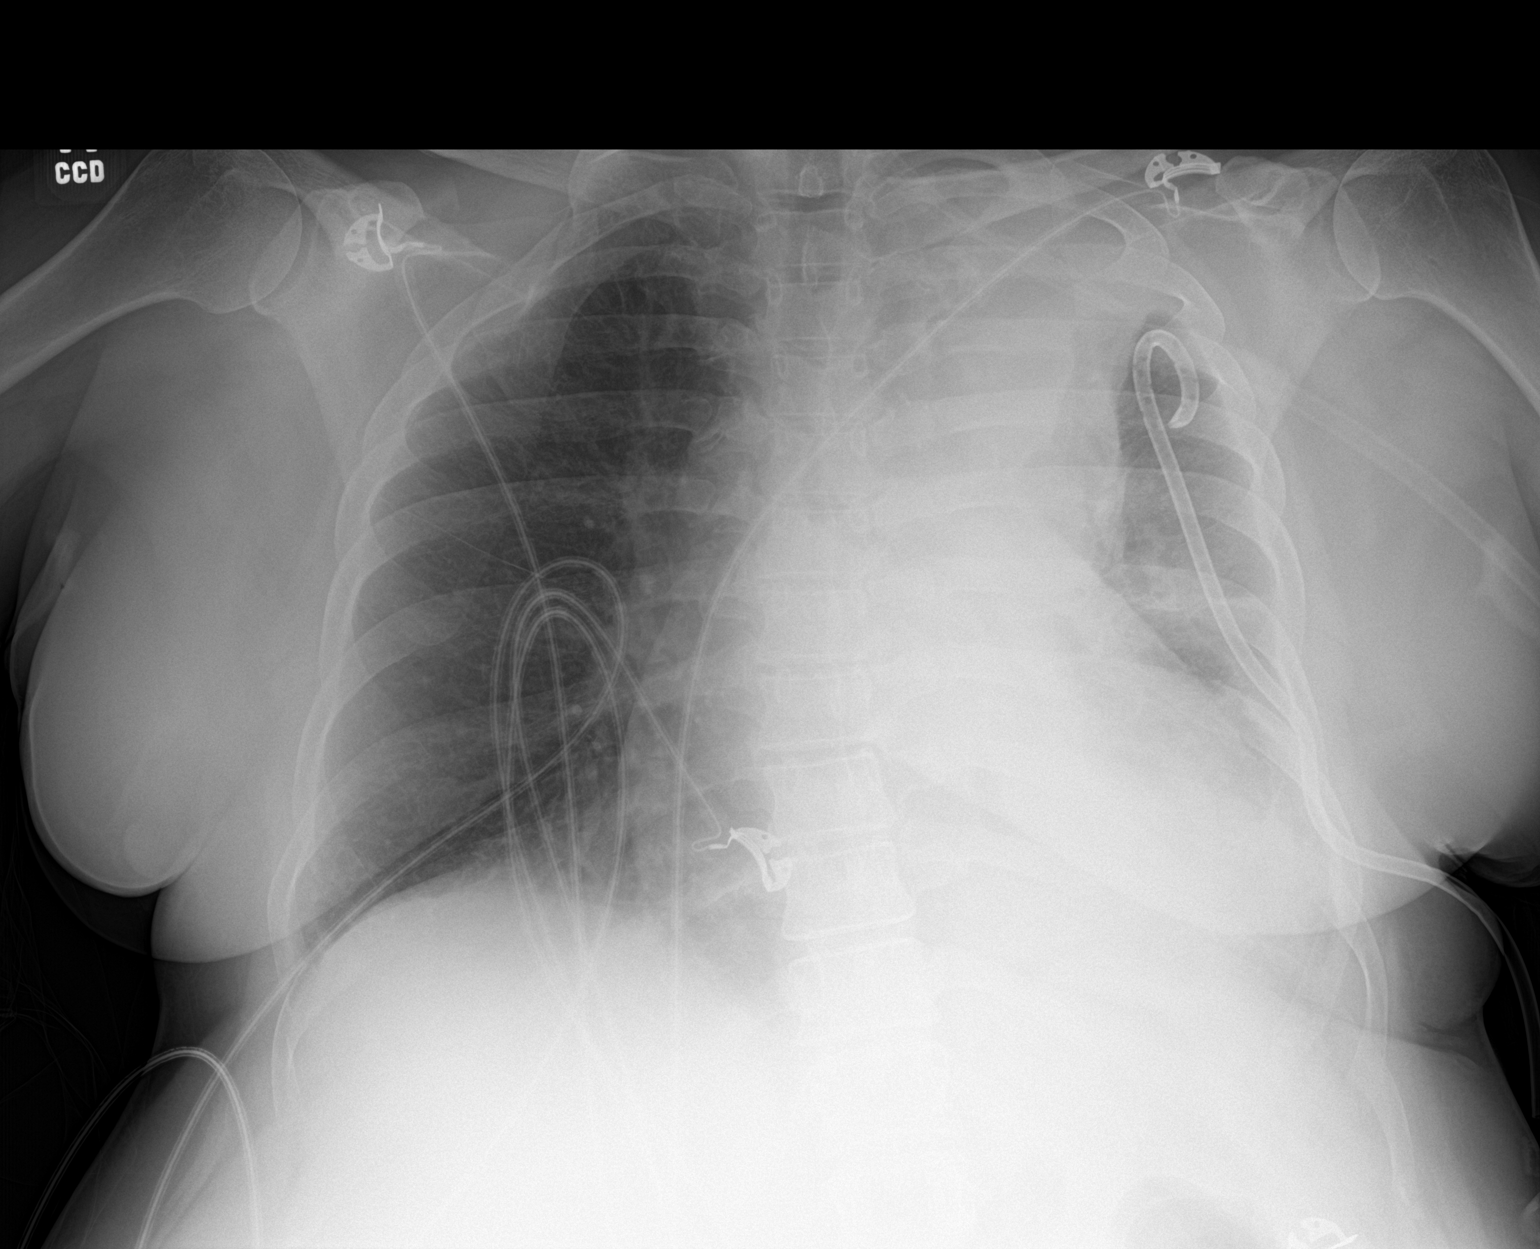

[1 of 1 positions shown; findings below may reference images not displayed]

FINDINGS: Left chest tube in stable position. Continued left upper lobe
collapse. There is improving left lung aeration though. The right
lung is clear. Cardiomegaly.
IMPRESSION: Improved left-sided aeration but persistent left upper lobe
collapse. No visible pneumothorax.

Cardiomegaly.

## 2022-11-12 NOTE — Telephone Encounter (Signed)
TC
